# Patient Record
Sex: Female | Born: 1935 | Race: Black or African American | Hispanic: No | Smoking: Never smoker
Health system: Southern US, Community
[De-identification: ages and names within clinical notes are randomized; demographics above are authoritative.]

## PROBLEM LIST (undated history)

## (undated) DIAGNOSIS — I509 Heart failure, unspecified: Secondary | ICD-10-CM

## (undated) DIAGNOSIS — E669 Obesity, unspecified: Secondary | ICD-10-CM

## (undated) DIAGNOSIS — E7800 Pure hypercholesterolemia, unspecified: Secondary | ICD-10-CM

## (undated) DIAGNOSIS — I10 Essential (primary) hypertension: Secondary | ICD-10-CM

## (undated) DIAGNOSIS — E119 Type 2 diabetes mellitus without complications: Secondary | ICD-10-CM

## (undated) HISTORY — PX: CATARACT EXTRACTION, BILATERAL: SHX1313

## (undated) HISTORY — PX: OTHER SURGICAL HISTORY: SHX169

## (undated) HISTORY — PX: PARTIAL HYSTERECTOMY: SHX80

## (undated) HISTORY — DX: Essential (primary) hypertension: I10

## (undated) HISTORY — DX: Pure hypercholesterolemia, unspecified: E78.00

## (undated) NOTE — *Deleted (*Deleted)
[facility]  Name: Helen Richardson  MRN: [id number] DOB: 07/07/1933  Chief Complaint/ HPI  Helen Richardson,  9 y.o. , female presents for their Initial CCM visit with the clinical pharmacist via telephone due to COVID-19 Pandemic. Call completed with patient's nurse, [name].   PCP : Dorothyann Peng, MD  Their chronic conditions include: Hypertension, Hyperlipidemia and Diabetes  Office Visits: 12/14/18 OV: DM/HTN follow up. Pt complains of memory issues, feels she is more forgetful. Pt encouraged to keep track of daily blood sugars. HgbA1c has decreased to 9.7% (goal <8%). HTN fair control. Check RPR and TSH to determine if contributing to memory issues. Thyroid function normal. Liver and kidney function stable. Cholesterol is great. Encouraged chair exercises 5 times weekly. HD flu vaccine.   Consult Visits: 11/04/19 Palliative Care  07/12/19 Podiatry OV w/ Dr. Stacie Acres: Presented for at risk foot care. Mechanical debridement of nails 1-5 bilaterally. Return in 3 months.   07/06/19 Palliative Care: Goals of care addressed. Educations provided regarding diet (avoiding sweets an high calorie foods)  05/09/19 Palliative Care: Goals of care include to maximize quality of life and symptom management  CCM Encounters:  Medications: Outpatient Encounter Medications as of 12/30/2019  Medication Sig Note  . Alcohol Swabs (ALCOHOL PREP) 70 % PADS  03/10/2013: Received from: External Pharmacy  . aspirin 81 MG chewable tablet Chew 81 mg by mouth at bedtime.   . Calcium Carbonate-Vitamin D (CALTRATE 600+D PO) Take 1 tablet by mouth daily.   . carvedilol (COREG) 6.25 MG tablet TAKE 1 TABLET BY MOUTH TWICE DAILY   . diclofenac sodium (VOLTAREN) 1 % GEL APPLY TO AFFECTED AREA 3 TIMES DAILY AS NEEDED (Patient taking differently: Apply 2 g topically 3 (three) times daily as needed (pain). )   . furosemide (LASIX) 40 MG tablet TAKE 1 TABLET(40 MG) BY MOUTH DAILY   . glucose blood test strip  Test up to 4 times daily   . Insulin Pen Needle 32G X 4 MM MISC Use with insulin Dx code e11.65   . polyethylene glycol powder (GLYCOLAX/MIRALAX) 17 GM/SCOOP powder polyethylene glycol 3350 17 gram/dose oral powder  MIX 1 CAPFUL IN DRINK AND TK PO 1 TO 3 TIMES DAILY PRF DAILY SOFT STOOLS   . potassium chloride SA (KLOR-CON) 20 MEQ tablet Take 1 tablet (20 mEq total) by mouth once for 1 dose. Please d/c 2 rx for 2 times per day thanks   . rosuvastatin (CRESTOR) 10 MG tablet TAKE 1 TABLET BY MOUTH MONDAY-FRIDAY. DO NOT TAKE ON SATURDAY OR SUNDAY   . rosuvastatin (CRESTOR) 20 MG tablet TAKE 1 TABLET BY MOUTH EVERY DAY   . telmisartan (MICARDIS) 80 MG tablet TAKE 1 TABLET(80 MG) BY MOUTH DAILY   . TRADJENTA 5 MG TABS tablet TK 1 T PO QD   . TRESIBA FLEXTOUCH 200 UNIT/ML FlexTouch Pen INJECT 36 UNITS UNDER THE SKIN DAILY. MAX TITRATION 80 UNITS   . UNABLE TO FIND lancets 30 gauge   . UNABLE TO FIND Comfort EZ Pen Needles 32 gauge x 5/32"    No facility-administered encounter medications on file as of 12/30/2019.    Current Diagnosis/Assessment:    Goals Addressed   None     Diabetes   A1c goal <8%  Recent Relevant Labs: Lab Results  Component Value Date/Time   HGBA1C 9.7 (H) 12/21/2018 03:50 PM   HGBA1C 10.2 (H) 07/12/2018 02:44 PM    Last diabetic Eye exam:  Lab Results  Component Value  Date/Time   HMDIABEYEEXA Retinopathy (A) 09/28/2018 12:00 AM    Last diabetic Foot exam: Followed by Podiatry regularly  Checking BG: Daily  Recent FBG Readings: Average 130-134, 118 this morning Recent pre-meal BG readings:  Recent 2hr PP BG readings:   Recent HS BG readings:   Patient has failed these meds in past: Tradjenta, Humalog Mix Kwikpen Patient is currently uncontrolled on the following medications: . Tresiba 200 units/ml 36 units daily  We discussed:  . Per [name], pt administers Tresiba 36 units in the morning . Diet extensively o Breakfast: Grits, egg, bacon, toast o  Snacks: Yogurt, crackers o Dinner: Varies, [employer], [employer], Journalist, newspaper  . Exercise extensively o Pt is able to exercise, walks around house o Physical therapy comes to the home sometimes . [name] was stopped due to side effects, but still on medication list  Plan Continue current medications   Hyperlipidemia   LDL goal < 70  Lipid Panel     Component Value Date/Time   CHOL 153 12/21/2018 1550   TRIG 97 12/21/2018 1550   HDL 65 12/21/2018 1550   LDLCALC 70 12/21/2018 1550    Hepatic Function Latest Ref Rng & Units 12/21/2018 06/11/2018 06/10/2018  Total Protein 6.0 - 8.5 g/dL 6.3 4.0(J) 6.4(L)  Albumin 3.6 - 4.6 g/dL 3.6 2.0(L) 2.0(L)  AST 0 - 40 IU/L 12 18 21   ALT 0 - 32 IU/L 9 20 22   Alk Phosphatase 39 - 117 IU/L 71 67 61  Total Bilirubin 0.0 - 1.2 mg/dL 0.2 0.5 0.7  Bilirubin, Direct 0.0 - 0.2 mg/dL - - 0.3(H)     The ASCVD Risk score Denman George DC Jr., et al., 2013) failed to calculate for the following reasons:   The 2013 ASCVD risk score is only valid for ages 43 to 54   Patient has failed these meds in past: N/A Patient is currently controlled on the following medications:  . Rosuvastatin 20mg  daily Monday through Friday   We discussed:   Diet and exercise extensively . Pt is taking cholesterol medication Monday through Friday  Plan Continue current medications  Collaborate with PCP regarding correct does of rosuvastatin 10mg  vs 20mg  and daily versus Monday through Friday  Hypertension   BP goal is:  <130/80  Office blood pressures are  BP Readings from Last 3 Encounters:  12/14/18 140/72  07/12/18 120/84  07/02/18 (!) 130/58   Patient checks BP at home daily Patient home BP readings are ranging: 130-140/70  Patient has failed these meds in the past: Losartan Patient is currently controlled on the following medications:  . Telmisartan 80mg  daily . Furosemide 40mg  daily . Carvedilol 6.25mg  twice daily . Potassium daily  We discussed: . BP  is fairly well controlled at home . Per [name], pt was changed to potassium 1 tablet daily instead of twice daily o Inquired when next office visit was and when labwork needs to be done o Collaborated with PCP staff to contact pt to schedule office visit and labwork to determine dosing for potassium . Relayed to [place] that appt scheduled for 9/14  Plan Continue current medications     Vaccines   Reviewed and discussed patient's vaccination history.    Immunization History  Administered Date(s) Administered  . Influenza, High Dose Seasonal PF 11/19/2017, 12/14/2018   Plan Review and discuss at follow up  Follow up:   Willa Frater, PharmD Clinical Pharmacist [facility] [phone]

---

## 1998-11-08 ENCOUNTER — Other Ambulatory Visit: Admission: RE | Admit: 1998-11-08 | Discharge: 1998-11-08 | Payer: Self-pay | Admitting: Obstetrics

## 1999-08-28 ENCOUNTER — Encounter: Admission: RE | Admit: 1999-08-28 | Discharge: 1999-08-28 | Payer: Self-pay | Admitting: Cardiology

## 1999-08-28 ENCOUNTER — Encounter: Payer: Self-pay | Admitting: Cardiology

## 1999-11-25 ENCOUNTER — Other Ambulatory Visit: Admission: RE | Admit: 1999-11-25 | Discharge: 1999-11-25 | Payer: Self-pay | Admitting: Obstetrics

## 2001-07-09 ENCOUNTER — Emergency Department (HOSPITAL_COMMUNITY): Admission: EM | Admit: 2001-07-09 | Discharge: 2001-07-09 | Payer: Self-pay | Admitting: Emergency Medicine

## 2003-01-09 ENCOUNTER — Encounter (INDEPENDENT_AMBULATORY_CARE_PROVIDER_SITE_OTHER): Payer: Self-pay | Admitting: Specialist

## 2003-01-09 ENCOUNTER — Ambulatory Visit (HOSPITAL_COMMUNITY): Admission: RE | Admit: 2003-01-09 | Discharge: 2003-01-09 | Payer: Self-pay | Admitting: General Surgery

## 2004-02-29 ENCOUNTER — Ambulatory Visit: Payer: Self-pay | Admitting: Internal Medicine

## 2006-01-01 ENCOUNTER — Ambulatory Visit (HOSPITAL_COMMUNITY): Admission: RE | Admit: 2006-01-01 | Discharge: 2006-01-01 | Payer: Self-pay | Admitting: Ophthalmology

## 2006-01-31 ENCOUNTER — Ambulatory Visit (HOSPITAL_COMMUNITY): Admission: RE | Admit: 2006-01-31 | Discharge: 2006-01-31 | Payer: Self-pay | Admitting: Ophthalmology

## 2006-09-24 ENCOUNTER — Encounter: Admission: RE | Admit: 2006-09-24 | Discharge: 2006-09-24 | Payer: Self-pay | Admitting: Cardiology

## 2009-04-21 ENCOUNTER — Emergency Department (HOSPITAL_COMMUNITY): Admission: EM | Admit: 2009-04-21 | Discharge: 2009-04-21 | Payer: Self-pay | Admitting: Emergency Medicine

## 2010-05-22 LAB — URINALYSIS, ROUTINE W REFLEX MICROSCOPIC
Bilirubin Urine: NEGATIVE
Glucose, UA: NEGATIVE mg/dL
Hgb urine dipstick: NEGATIVE
Ketones, ur: NEGATIVE mg/dL
Nitrite: NEGATIVE
Protein, ur: NEGATIVE mg/dL
Specific Gravity, Urine: 1.004 — ABNORMAL LOW (ref 1.005–1.030)
Urobilinogen, UA: 0.2 mg/dL (ref 0.0–1.0)
pH: 6.5 (ref 5.0–8.0)

## 2010-05-22 LAB — GLUCOSE, CAPILLARY: Glucose-Capillary: 188 mg/dL — ABNORMAL HIGH (ref 70–99)

## 2010-06-20 ENCOUNTER — Other Ambulatory Visit: Payer: Self-pay | Admitting: Podiatry

## 2010-06-20 DIAGNOSIS — I739 Peripheral vascular disease, unspecified: Secondary | ICD-10-CM

## 2010-06-21 ENCOUNTER — Encounter (INDEPENDENT_AMBULATORY_CARE_PROVIDER_SITE_OTHER): Payer: Medicare Other | Admitting: Cardiology

## 2010-06-21 DIAGNOSIS — E1159 Type 2 diabetes mellitus with other circulatory complications: Secondary | ICD-10-CM

## 2010-06-24 ENCOUNTER — Encounter: Payer: Self-pay | Admitting: Podiatry

## 2010-07-19 NOTE — Op Note (Signed)
NAME:  Helen Richardson, Helen Richardson               ACCOUNT NO.:  [id number]   MEDICAL RECORD NO.:  [id number]          PATIENT TYPE:  AMB   LOCATION:  SDS                          FACILITY:  [facility]   PHYSICIAN:  Salley Scarlet., M.D.DATE OF BIRTH:  18-Aug-1933   DATE OF PROCEDURE:  DATE OF DISCHARGE:  01/01/2006                               OPERATIVE REPORT   PREOPERATIVE DIAGNOSIS:  Immature cataract, right eye.   POSTOPERATIVE DIAGNOSIS:  Immature cataract, right eye.   OPERATION:  Extracapsular cataract extraction with intraocular lens  implantation.   ANESTHESIA:  Local using Xylocaine 2% with Marcaine 0.75%, [place].   JUSTIFICATION FOR PROCEDURE:  This is a 77 year old diabetic lady who  complains of blurring of vision with difficulty seeing to read.  She was  evaluated and found to have a visual acuity best corrected at 20/70 on  the right and over 50 on the left.  She has a history of diabetic  retinopathy and has had laser treatment done by Dr. Luciana Axe.  Cataract  extraction of the right eye was recommended.  She is admitted at this  time for that purpose.   PROCEDURE:  Under the influence of IV sedation and Van Lint akinesia, a  retrobulbar anesthesia was given.  The patient was prepped and draped in  the usual manner.  The lid speculum was inserted under the upper and  lower lids of the right eye, and a 4-0 silk traction suture was passed  through the belly of the superior rectus muscle for retraction.  A  fornix-based conjunctival flap was turned, and hemostasis was achieved  using cautery.  The eye was inspected, and the pupil was found to be  extremely miotic.  Therefore, the decision was made to do an  extracapsular procedure rather than a phaco procedure.  Therefore, an  incision was made in the sclera at the limbus to extend for  approximately 120 degrees.  The anterior chamber was entered through  this groove incision at the 11:30 o'clock position.  Ocucoat was  injected  into the eye.  An anterior capsulotomy was done using a bent 25-  gauge needle.  The corneoscleral wound was then extended first towards  the left and then towards the right, placing a single 8-0 Vicryl suture  across each arm of the incision as it was made towards the left and  towards the right.  The nucleus was then manually expressed from the  eye.  The two previously placed sutures were closed, and third one was  placed at the 12 o'clock position.  The IA handpiece was passed into the  eye, and the residual cortical material was aspirated.  The posterior  capsule was polished using an olive tip polisher.  Ocucoat was injected  into the eye again, and a posterior chamber lens of the EZE60 type was  inserted into the eye behind the iris without difficulty.  The anterior  chamber was reformed, and the pupil was restricted using Miochol.  The  residual Ocucoat was aspirated from the eye.  The corneoscleral wound  was then closed by using a combination  of interrupted sutures of 8-0  Vicryl and 10-0 nylon.  As it was ascertained that the wound was  airtight and watertight, the conjunctiva was closed using thermal  cautery.  1 mL of Celestone and 0.5 cc of gentamicin were injected  subconjunctivally.  Maxitrol ophthalmic ointment and paraffin ointment  were applied along with a patch and Fox shield.   The patient tolerated the procedure well and was discharged to the  postanesthesia recovery room in satisfactory condition.  She is  instructed to rest today, to take Darvocet N 100 every 4 hours as needed  for pain, and to see me in the office tomorrow for further evaluation.   DISCHARGE DIAGNOSIS:  Immature cataract, right eye.      Salley Scarlet., M.D.  Electronically Signed     TB/MEDQ  D:  01/02/2006  T:  01/02/2006  Job:  [id number]

## 2010-09-13 ENCOUNTER — Encounter (HOSPITAL_COMMUNITY)
Admission: RE | Admit: 2010-09-13 | Discharge: 2010-09-13 | Disposition: A | Discharge status: Home / Self Care | Payer: Medicare Other | Source: Ambulatory Visit | Attending: Ophthalmology | Admitting: Ophthalmology

## 2010-09-13 ENCOUNTER — Ambulatory Visit (HOSPITAL_COMMUNITY)
Admission: RE | Admit: 2010-09-13 | Discharge: 2010-09-13 | Disposition: A | Discharge status: Home / Self Care | Payer: Medicare Other | Source: Ambulatory Visit | Attending: Ophthalmology | Admitting: Ophthalmology

## 2010-09-13 ENCOUNTER — Other Ambulatory Visit (HOSPITAL_COMMUNITY): Payer: Self-pay | Admitting: Ophthalmology

## 2010-09-13 DIAGNOSIS — E119 Type 2 diabetes mellitus without complications: Secondary | ICD-10-CM | POA: Insufficient documentation

## 2010-09-13 DIAGNOSIS — R05 Cough: Secondary | ICD-10-CM | POA: Insufficient documentation

## 2010-09-13 DIAGNOSIS — Z01812 Encounter for preprocedural laboratory examination: Secondary | ICD-10-CM | POA: Insufficient documentation

## 2010-09-13 DIAGNOSIS — Z01810 Encounter for preprocedural cardiovascular examination: Secondary | ICD-10-CM | POA: Insufficient documentation

## 2010-09-13 DIAGNOSIS — I10 Essential (primary) hypertension: Secondary | ICD-10-CM | POA: Insufficient documentation

## 2010-09-13 DIAGNOSIS — H269 Unspecified cataract: Secondary | ICD-10-CM | POA: Insufficient documentation

## 2010-09-13 DIAGNOSIS — R059 Cough, unspecified: Secondary | ICD-10-CM | POA: Insufficient documentation

## 2010-09-13 DIAGNOSIS — Z01818 Encounter for other preprocedural examination: Secondary | ICD-10-CM | POA: Insufficient documentation

## 2010-09-13 LAB — BASIC METABOLIC PANEL
BUN: 16 mg/dL (ref 6–23)
CO2: 30 mEq/L (ref 19–32)
Calcium: 9.7 mg/dL (ref 8.4–10.5)
Chloride: 105 mEq/L (ref 96–112)
Creatinine, Ser: 0.78 mg/dL (ref 0.50–1.10)
GFR calc Af Amer: >60 mL/min (ref >60)
GFR calc non Af Amer: >60 mL/min (ref >60)
Glucose, Bld: 44 mg/dL — CL (ref 70–99)
Potassium: 4.2 mEq/L (ref 3.5–5.1)
Sodium: 142 mEq/L (ref 135–145)

## 2010-09-13 LAB — CBC
HCT: 38.5 % (ref 36.0–46.0)
Hemoglobin: 13.3 g/dL (ref 12.0–15.0)
MCH: 30.6 pg (ref 26.0–34.0)
MCHC: 34.5 g/dL (ref 30.0–36.0)
MCV: 88.7 fL (ref 78.0–100.0)
Platelets: 234 10*3/uL (ref 150–400)
RBC: 4.34 MIL/uL (ref 3.87–5.11)
RDW: 13.5 % (ref 11.5–15.5)
WBC: 7.2 10*3/uL (ref 4.0–10.5)

## 2010-09-16 ENCOUNTER — Ambulatory Visit (HOSPITAL_COMMUNITY)
Admission: RE | Admit: 2010-09-16 | Discharge: 2010-09-16 | Disposition: A | Discharge status: Home / Self Care | Payer: Medicare Other | Source: Ambulatory Visit | Attending: Ophthalmology | Admitting: Ophthalmology

## 2010-09-16 DIAGNOSIS — E119 Type 2 diabetes mellitus without complications: Secondary | ICD-10-CM | POA: Insufficient documentation

## 2010-09-16 DIAGNOSIS — H2510 Age-related nuclear cataract, unspecified eye: Secondary | ICD-10-CM | POA: Insufficient documentation

## 2010-09-16 DIAGNOSIS — E669 Obesity, unspecified: Secondary | ICD-10-CM | POA: Insufficient documentation

## 2010-09-16 DIAGNOSIS — I10 Essential (primary) hypertension: Secondary | ICD-10-CM | POA: Insufficient documentation

## 2010-09-16 LAB — GLUCOSE, CAPILLARY
Glucose-Capillary: 244 mg/dL — ABNORMAL HIGH (ref 70–99)
Glucose-Capillary: 174 mg/dL — ABNORMAL HIGH (ref 70–99)
Glucose-Capillary: 159 mg/dL — ABNORMAL HIGH (ref 70–99)

## 2010-10-02 NOTE — Op Note (Signed)
  NAME:  Helen Richardson, Helen Richardson               ACCOUNT NO.:  [id number]  MEDICAL RECORD NO.:  [id number]  LOCATION:  SDSC                         FACILITY:  [facility]  PHYSICIAN:  Shade Flood, MD       DATE OF BIRTH:  09-02-1933  DATE OF PROCEDURE:  09/16/2010 DATE OF DISCHARGE:  09/16/2010                              OPERATIVE REPORT   PREOPERATIVE DIAGNOSIS:  Nuclear sclerotic cataract, left eye.  POSTOPERATIVE DIAGNOSIS:  Nuclear sclerotic cataract, left eye.  PROCEDURE PERFORMED:  Phacoemulsification with posterior chamber intraocular lens, left eye.  SURGEON:  Shade Flood, MD.  ESTIMATED BLOOD LOSS:  Less than 1 mL.  There were no complications.  No specimens for pathology.  DESCRIPTION OF PROCEDURE:  The patient was prepared and draped in the usual fashion for ocular surgery on the left eye and a solid lid speculum was placed.  A peripheral clear corneal incision was made centered at the 11 o'clock meridian with a Grieshaber 01 blade.  A separate stab incision with a 15-degree blade was made at the 2:13 meridian.  Amvisc was then instilled into the anterior chamber.  A bent 25-gauge needle was used to perform a capsulorrhexis.  The lens was then hydrodissected using balanced salt solution and Nichamin cannula.  The Chang chopper was then used to rotate the lens within the capsule bag and then the wound was enlarged to permit the phaco handpiece.  A combined phaco chop technique was employed fracturing the lens into sections with subsequent removal from the eye.  The IA cannula was then used to remove cortex from the capsule bag.  The wound was enlarged to its full extent and Provisc was again instilled into the anterior chamber.  The Monarch injector was used to place a folded AcrySof MA50BM lens into the capsular bag.  The angled Sinskey lens hook was then used to dial the trailing haptic into position.  The IA cannula was used to remove the viscoelastic from the anterior  chamber.  The wound was self- sealing, so no additional sutures were employed.  The patient was given 4 mg of Decadron subconjunctivally with the needle directly visualized and 100 mg of Ancef subconjunctivally with the needle directly visualized.  The lid speculum was removed and the patient's eye was patched using polymyxin bacitracin ointment, plastic shield was placed, and she was transferred from the operating room to the postoperative recovery area with stable vital signs.          ______________________________ Shade Flood, MD     GG/MEDQ  D:  09/18/2010  T:  09/18/2010  Job:  [id number]  Electronically Signed by Shade Flood MD on 10/02/2010 11:39:28 AM

## 2013-02-10 ENCOUNTER — Ambulatory Visit: Payer: Self-pay | Admitting: Podiatry

## 2013-03-10 ENCOUNTER — Ambulatory Visit (INDEPENDENT_AMBULATORY_CARE_PROVIDER_SITE_OTHER): Payer: Medicare PPO | Admitting: Podiatry

## 2013-03-10 ENCOUNTER — Encounter: Payer: Self-pay | Admitting: Podiatry

## 2013-03-10 VITALS — BP 107/68 | HR 77 | Resp 12

## 2013-03-10 DIAGNOSIS — B351 Tinea unguium: Secondary | ICD-10-CM

## 2013-03-10 DIAGNOSIS — M79609 Pain in unspecified limb: Secondary | ICD-10-CM

## 2013-03-10 NOTE — Progress Notes (Signed)
Subjective:     Patient ID: Helen Richardson, female   DOB: 04/01/1933, 80 y.o.   MRN: [id number]  HPI patient is found to have nail disease with thickness and pain 1-5 both feet that she cannot take care of herself   Review of Systems     Objective:   Physical Exam Neurovascular status unchanged with thick nail disease and pain 1-5 both feet    Assessment:     Mycotic nail infection with pain 1-5 both feet    Plan:     Debridement of painful nailbeds 1-5 both feet with no iatrogenic bleeding noted

## 2013-06-02 ENCOUNTER — Ambulatory Visit: Payer: Medicare PPO | Admitting: Podiatry

## 2013-06-09 ENCOUNTER — Encounter: Payer: Self-pay | Admitting: Podiatry

## 2013-06-09 ENCOUNTER — Ambulatory Visit (INDEPENDENT_AMBULATORY_CARE_PROVIDER_SITE_OTHER): Payer: Medicare PPO | Admitting: Podiatry

## 2013-06-09 VITALS — BP 152/69 | HR 86 | Resp 12

## 2013-06-09 DIAGNOSIS — M79609 Pain in unspecified limb: Secondary | ICD-10-CM

## 2013-06-09 DIAGNOSIS — B351 Tinea unguium: Secondary | ICD-10-CM

## 2013-06-12 NOTE — Progress Notes (Signed)
Subjective:     Patient ID: Helen Richardson, female   DOB: 10/17/1933, 80 y.o.   MRN: [id number]  HPI patient is presenting with thick nailbeds 1-5 both feet that are painful when pressed and she can not cut   Review of Systems     Objective:   Physical Exam Neurovascular status intact with thick painful nail bed 1-5 both feet that she cannot trim    Assessment:     Mycotic nail infection with pain 1-5 both feet    Plan:     Debridement painful nailbeds 1-5 both feet with no iatrogenic bleeding noted

## 2013-12-08 ENCOUNTER — Ambulatory Visit (INDEPENDENT_AMBULATORY_CARE_PROVIDER_SITE_OTHER): Payer: Medicare PPO | Admitting: Podiatry

## 2013-12-08 ENCOUNTER — Encounter: Payer: Self-pay | Admitting: Podiatry

## 2013-12-08 DIAGNOSIS — M79673 Pain in unspecified foot: Secondary | ICD-10-CM

## 2013-12-08 DIAGNOSIS — B351 Tinea unguium: Secondary | ICD-10-CM

## 2013-12-08 NOTE — Progress Notes (Signed)
Subjective:     Patient ID: Helen Richardson, female   DOB: 01/15/1934, 80 y.o.   MRN: [id number]  HPI patient presents with nail disease 1-5 both feet that she cannot cut   Review of Systems     Objective:   Physical Exam Neurovascular status intact with thick yellow brittle nailbeds 1-5 both feet    Assessment:     Mycotic nail infection with pain 1-5 both feet    Plan:     Debride painful nailbeds 1-5 both feet with no iatrogenic bleeding noted

## 2014-03-16 ENCOUNTER — Other Ambulatory Visit: Payer: Medicare PPO

## 2014-03-20 ENCOUNTER — Ambulatory Visit (INDEPENDENT_AMBULATORY_CARE_PROVIDER_SITE_OTHER): Payer: Medicare PPO | Admitting: Podiatry

## 2014-03-20 DIAGNOSIS — B351 Tinea unguium: Secondary | ICD-10-CM

## 2014-03-20 DIAGNOSIS — M79673 Pain in unspecified foot: Secondary | ICD-10-CM

## 2014-03-20 NOTE — Progress Notes (Signed)
Patient ID: Helen Richardson, female   DOB: 06/02/1933, 81 y.o.   MRN: [id number]  Subjective: 81 year old female presents the office today for complaints of bilateral painful, elongated toenails at digits 1 through 5. She states of the nails are painful, particularly with shoe gear. She denies any recent redness or drainage from the nail sites. No other complaints at this time.  Objective: AAO 3, NAD DP/PT pulses palpable, CRT less than 3 seconds Protective sensation appears to be intact with Dorann OuSimms Weinstein monofilament Nails hypertrophic, dystrophic, elongated, brittle, discolored 10. No swelling erythema or drainage from the nail sites. No open lesions or pre-ulcerative lesions identified. No pain with calf compression, swelling, warmth, erythema. No areas of tenderness bilateral lower extremities. No overlying edema, erythema, increase in warmth bilaterally.  Assessment: 81 year old female with symptomatic onychomycosis bilateral feet of digits 1-5  Plan: -Treatment options were discussed with the patient clean alternatives, risks, complications. -Nail sharply debrided 10 without complication/bleeding. -Discussed daily foot inspection. -Follow-Up in 3 months or sooner should any problems arise. She would like to follow-up in 6 months.  In the meantime, encouraged to call the office with any questions, concerns, change in symptoms.

## 2014-06-20 DIAGNOSIS — E11359 Type 2 diabetes mellitus with proliferative diabetic retinopathy without macular edema: Secondary | ICD-10-CM | POA: Diagnosis not present

## 2014-06-20 DIAGNOSIS — H43811 Vitreous degeneration, right eye: Secondary | ICD-10-CM | POA: Diagnosis not present

## 2014-08-15 DIAGNOSIS — N63 Unspecified lump in breast: Secondary | ICD-10-CM | POA: Diagnosis not present

## 2014-09-13 DIAGNOSIS — N182 Chronic kidney disease, stage 2 (mild): Secondary | ICD-10-CM | POA: Diagnosis not present

## 2014-09-13 DIAGNOSIS — E1122 Type 2 diabetes mellitus with diabetic chronic kidney disease: Secondary | ICD-10-CM | POA: Diagnosis not present

## 2014-09-13 DIAGNOSIS — I129 Hypertensive chronic kidney disease with stage 1 through stage 4 chronic kidney disease, or unspecified chronic kidney disease: Secondary | ICD-10-CM | POA: Diagnosis not present

## 2014-09-13 DIAGNOSIS — N08 Glomerular disorders in diseases classified elsewhere: Secondary | ICD-10-CM | POA: Diagnosis not present

## 2014-09-18 ENCOUNTER — Ambulatory Visit: Payer: Medicare PPO

## 2014-09-19 ENCOUNTER — Ambulatory Visit (INDEPENDENT_AMBULATORY_CARE_PROVIDER_SITE_OTHER): Payer: Medicare PPO | Admitting: Podiatry

## 2014-09-19 ENCOUNTER — Encounter: Payer: Self-pay | Admitting: Podiatry

## 2014-09-19 DIAGNOSIS — M79673 Pain in unspecified foot: Secondary | ICD-10-CM

## 2014-09-19 DIAGNOSIS — B351 Tinea unguium: Secondary | ICD-10-CM

## 2014-09-19 NOTE — Progress Notes (Signed)
Patient ID: Helen Richardson, female   DOB: 10/04/1933, 81 y.o.   MRN: [id number] Complaint:  Visit Type: Patient returns to my office for continued preventative foot care services. Complaint: Patient states" my nails have grown long and thick and become painful to walk and wear shoes" Patient has been diagnosed with DM with no foot complications. She presents for preventative foot care services. No changes to ROS  Podiatric Exam: Vascular: dorsalis pedis and posterior tibial pulses are palpable bilateral. Capillary return is immediate. Temperature gradient is WNL. Skin turgor WNL  Sensorium: Normal Semmes Weinstein monofilament test. Normal tactile sensation bilaterally. Nail Exam: Pt has thick disfigured discolored nails with subungual debris noted bilateral entire nail hallux through fifth toenails Ulcer Exam: There is no evidence of ulcer or pre-ulcerative changes or infection. Orthopedic Exam: Muscle tone and strength are WNL. No limitations in general ROM. No crepitus or effusions noted. Foot type and digits show no abnormalities. Bony prominences are unremarkable. Skin: No Porokeratosis. No infection or ulcers  Diagnosis:  Tinea unguium, Pain in right toe, pain in left toes  Treatment & Plan Procedures and Treatment: Consent by patient was obtained for treatment procedures. The patient understood the discussion of treatment and procedures well. All questions were answered thoroughly reviewed. Debridement of mycotic and hypertrophic toenails, 1 through 5 bilateral and clearing of subungual debris. No ulceration, no infection noted.  Return Visit-Office Procedure: Patient instructed to return to the office for a follow up visit 3 months for continued evaluation and treatment.

## 2014-10-04 DIAGNOSIS — E1165 Type 2 diabetes mellitus with hyperglycemia: Secondary | ICD-10-CM | POA: Diagnosis not present

## 2014-12-14 DIAGNOSIS — I129 Hypertensive chronic kidney disease with stage 1 through stage 4 chronic kidney disease, or unspecified chronic kidney disease: Secondary | ICD-10-CM | POA: Diagnosis not present

## 2014-12-14 DIAGNOSIS — N08 Glomerular disorders in diseases classified elsewhere: Secondary | ICD-10-CM | POA: Diagnosis not present

## 2014-12-14 DIAGNOSIS — E1122 Type 2 diabetes mellitus with diabetic chronic kidney disease: Secondary | ICD-10-CM | POA: Diagnosis not present

## 2014-12-14 DIAGNOSIS — Z23 Encounter for immunization: Secondary | ICD-10-CM | POA: Diagnosis not present

## 2014-12-14 DIAGNOSIS — N182 Chronic kidney disease, stage 2 (mild): Secondary | ICD-10-CM | POA: Diagnosis not present

## 2014-12-26 ENCOUNTER — Encounter: Payer: Self-pay | Admitting: Podiatry

## 2014-12-26 ENCOUNTER — Ambulatory Visit (INDEPENDENT_AMBULATORY_CARE_PROVIDER_SITE_OTHER): Payer: Medicare PPO | Admitting: Podiatry

## 2014-12-26 DIAGNOSIS — M79673 Pain in unspecified foot: Secondary | ICD-10-CM | POA: Diagnosis not present

## 2014-12-26 DIAGNOSIS — B351 Tinea unguium: Secondary | ICD-10-CM | POA: Diagnosis not present

## 2014-12-26 NOTE — Progress Notes (Signed)
Patient ID: Helen Richardson, female   DOB: 03/30/1933, 81 y.o.   MRN: [id number] Complaint:  Visit Type: Patient returns to my office for continued preventative foot care services. Complaint: Patient states" my nails have grown long and thick and become painful to walk and wear shoes" Patient has been diagnosed with DM with no foot complications. She presents for preventative foot care services. No changes to ROS  Podiatric Exam: Vascular: dorsalis pedis and posterior tibial pulses are palpable bilateral. Capillary return is immediate. Temperature gradient is WNL. Skin turgor WNL  Sensorium: Normal Semmes Weinstein monofilament test. Normal tactile sensation bilaterally. Nail Exam: Pt has thick disfigured discolored nails with subungual debris noted bilateral entire nail hallux through fifth toenails Ulcer Exam: There is no evidence of ulcer or pre-ulcerative changes or infection. Orthopedic Exam: Muscle tone and strength are WNL. No limitations in general ROM. No crepitus or effusions noted. Foot type and digits show no abnormalities. Bony prominences are unremarkable. Skin: No Porokeratosis. No infection or ulcers  Diagnosis:  Tinea unguium, Pain in right toe, pain in left toes  Treatment & Plan Procedures and Treatment: Consent by patient was obtained for treatment procedures. The patient understood the discussion of treatment and procedures well. All questions were answered thoroughly reviewed. Debridement of mycotic and hypertrophic toenails, 1 through 5 bilateral and clearing of subungual debris. No ulceration, no infection noted.  Return Visit-Office Procedure: Patient instructed to return to the office for a follow up visit 3 months for continued evaluation and treatment. 

## 2014-12-28 DIAGNOSIS — E119 Type 2 diabetes mellitus without complications: Secondary | ICD-10-CM | POA: Diagnosis not present

## 2014-12-28 DIAGNOSIS — E876 Hypokalemia: Secondary | ICD-10-CM | POA: Diagnosis not present

## 2014-12-28 DIAGNOSIS — E785 Hyperlipidemia, unspecified: Secondary | ICD-10-CM | POA: Diagnosis not present

## 2014-12-28 DIAGNOSIS — I10 Essential (primary) hypertension: Secondary | ICD-10-CM | POA: Diagnosis not present

## 2014-12-28 DIAGNOSIS — E669 Obesity, unspecified: Secondary | ICD-10-CM | POA: Diagnosis not present

## 2014-12-28 DIAGNOSIS — E559 Vitamin D deficiency, unspecified: Secondary | ICD-10-CM | POA: Diagnosis not present

## 2014-12-28 DIAGNOSIS — Z6839 Body mass index (BMI) 39.0-39.9, adult: Secondary | ICD-10-CM | POA: Diagnosis not present

## 2014-12-28 DIAGNOSIS — R413 Other amnesia: Secondary | ICD-10-CM | POA: Diagnosis not present

## 2014-12-28 DIAGNOSIS — Z794 Long term (current) use of insulin: Secondary | ICD-10-CM | POA: Diagnosis not present

## 2015-01-16 DIAGNOSIS — E1165 Type 2 diabetes mellitus with hyperglycemia: Secondary | ICD-10-CM | POA: Diagnosis not present

## 2015-02-16 DIAGNOSIS — M1712 Unilateral primary osteoarthritis, left knee: Secondary | ICD-10-CM | POA: Diagnosis not present

## 2015-02-16 DIAGNOSIS — M1711 Unilateral primary osteoarthritis, right knee: Secondary | ICD-10-CM | POA: Diagnosis not present

## 2015-04-10 ENCOUNTER — Ambulatory Visit: Payer: Medicare PPO | Admitting: Podiatry

## 2015-04-13 ENCOUNTER — Ambulatory Visit (INDEPENDENT_AMBULATORY_CARE_PROVIDER_SITE_OTHER): Payer: Medicare Other | Admitting: Podiatry

## 2015-04-13 DIAGNOSIS — M79673 Pain in unspecified foot: Secondary | ICD-10-CM | POA: Diagnosis not present

## 2015-04-13 DIAGNOSIS — B351 Tinea unguium: Secondary | ICD-10-CM | POA: Diagnosis not present

## 2015-04-13 NOTE — Progress Notes (Signed)
Patient ID: Helen Richardson, female   DOB: 05/28/1933, 82 y.o.   MRN: [id number] Complaint:  Visit Type: Patient returns to my office for continued preventative foot care services. Complaint: Patient states" my nails have grown long and thick and become painful to walk and wear shoes" Patient has been diagnosed with DM with no foot complications. She presents for preventative foot care services. No changes to ROS  Podiatric Exam: Vascular: dorsalis pedis and posterior tibial pulses are palpable bilateral. Capillary return is immediate. Temperature gradient is WNL. Skin turgor WNL  Sensorium: Normal Semmes Weinstein monofilament test. Normal tactile sensation bilaterally. Nail Exam: Pt has thick disfigured discolored nails with subungual debris noted bilateral entire nail hallux through fifth toenails Ulcer Exam: There is no evidence of ulcer or pre-ulcerative changes or infection. Orthopedic Exam: Muscle tone and strength are WNL. No limitations in general ROM. No crepitus or effusions noted. Foot type and digits show no abnormalities. Bony prominences are unremarkable. Skin: No Porokeratosis. No infection or ulcers  Diagnosis:  Tinea unguium, Pain in right toe, pain in left toes  Treatment & Plan Procedures and Treatment: Consent by patient was obtained for treatment procedures. The patient understood the discussion of treatment and procedures well. All questions were answered thoroughly reviewed. Debridement of mycotic and hypertrophic toenails, 1 through 5 bilateral and clearing of subungual debris. No ulceration, no infection noted.  Return Visit-Office Procedure: Patient instructed to return to the office for a follow up visit 3 months for continued evaluation and treatment.   [name] DPM

## 2015-06-28 ENCOUNTER — Encounter (HOSPITAL_COMMUNITY): Payer: Self-pay | Admitting: *Deleted

## 2015-06-28 DIAGNOSIS — E669 Obesity, unspecified: Secondary | ICD-10-CM | POA: Diagnosis not present

## 2015-06-28 DIAGNOSIS — E11649 Type 2 diabetes mellitus with hypoglycemia without coma: Secondary | ICD-10-CM | POA: Diagnosis not present

## 2015-06-28 LAB — CBC
HCT: 36.9 % (ref 36.0–46.0)
Hemoglobin: 11.9 g/dL — ABNORMAL LOW (ref 12.0–15.0)
MCH: 28.1 pg (ref 26.0–34.0)
MCHC: 32.2 g/dL (ref 30.0–36.0)
MCV: 87.2 fL (ref 78.0–100.0)
Platelets: 274 10*3/uL (ref 150–400)
RBC: 4.23 MIL/uL (ref 3.87–5.11)
RDW: 15.1 % (ref 11.5–15.5)
WBC: 6.9 10*3/uL (ref 4.0–10.5)

## 2015-06-28 LAB — BASIC METABOLIC PANEL
Anion gap: 9 (ref 5–15)
BUN: 17 mg/dL (ref 6–20)
CO2: 27 mmol/L (ref 22–32)
Calcium: 9.7 mg/dL (ref 8.9–10.3)
Chloride: 102 mmol/L (ref 101–111)
Creatinine, Ser: 1.11 mg/dL — ABNORMAL HIGH (ref 0.44–1.00)
GFR calc Af Amer: 53 mL/min — ABNORMAL LOW (ref >60)
GFR calc non Af Amer: 46 mL/min — ABNORMAL LOW (ref >60)
Glucose, Bld: 33 mg/dL — CL (ref 65–99)
Potassium: 4.3 mmol/L (ref 3.5–5.1)
Sodium: 138 mmol/L (ref 135–145)

## 2015-06-28 LAB — CBG MONITORING, ED
Glucose-Capillary: 196 mg/dL — ABNORMAL HIGH (ref 65–99)
Glucose-Capillary: 33 mg/dL — CL (ref 65–99)
Glucose-Capillary: 105 mg/dL — ABNORMAL HIGH (ref 65–99)

## 2015-06-28 MED ORDER — DEXTROSE 50 % IV SOLN
1.0000 | Freq: Once | INTRAVENOUS | Status: AC
  Administered 2015-06-28: 50 mL via INTRAVENOUS

## 2015-06-28 MED ORDER — DEXTROSE 50 % IV SOLN
INTRAVENOUS | Status: AC
  Filled 2015-06-28: qty 50

## 2015-06-28 NOTE — ED Notes (Signed)
Pt was upstairs as a visitor when her family member felt that her sugar dropped.  They did not check it, they brought her into the ED.  Pt did eat around 1830.  Pt is moving all extremities, no face droop but pt appears tired and family member said she was diaphoretic.

## 2015-06-28 NOTE — ED Notes (Signed)
Pt is feeling well.  She states that she is unsure what type of insulin she takes but its "30units in the am and 20units in the pm"

## 2015-06-28 NOTE — ED Notes (Signed)
Pt came in altered.  CBG 33, IV started and D50% given and pt becomes alert and speaking with us.  No distress.

## 2015-06-29 ENCOUNTER — Emergency Department (HOSPITAL_COMMUNITY)
Admission: EM | Admit: 2015-06-29 | Discharge: 2015-06-29 | Disposition: A | Discharge status: Home / Self Care | Payer: Medicare Other | Attending: Emergency Medicine | Admitting: Emergency Medicine

## 2015-06-29 HISTORY — DX: Obesity, unspecified: E66.9

## 2015-06-29 HISTORY — DX: Type 2 diabetes mellitus without complications: E11.9

## 2015-06-29 LAB — CBG MONITORING, ED
Glucose-Capillary: 163 mg/dL — ABNORMAL HIGH (ref 65–99)
Glucose-Capillary: 87 mg/dL (ref 65–99)

## 2015-07-27 ENCOUNTER — Ambulatory Visit (INDEPENDENT_AMBULATORY_CARE_PROVIDER_SITE_OTHER): Payer: Medicare Other | Admitting: Podiatry

## 2015-07-27 ENCOUNTER — Encounter: Payer: Self-pay | Admitting: Podiatry

## 2015-07-27 DIAGNOSIS — Z794 Long term (current) use of insulin: Secondary | ICD-10-CM

## 2015-07-27 DIAGNOSIS — M2010 Hallux valgus (acquired), unspecified foot: Secondary | ICD-10-CM

## 2015-07-27 DIAGNOSIS — M79673 Pain in unspecified foot: Secondary | ICD-10-CM | POA: Diagnosis not present

## 2015-07-27 DIAGNOSIS — E1140 Type 2 diabetes mellitus with diabetic neuropathy, unspecified: Secondary | ICD-10-CM | POA: Diagnosis not present

## 2015-07-27 DIAGNOSIS — B351 Tinea unguium: Secondary | ICD-10-CM

## 2015-07-27 NOTE — Progress Notes (Signed)
Patient ID: Helen Richardson, female   DOB: 03/23/1933, 82 y.o.   MRN: [id number] Complaint:  Visit Type: Patient returns to my office for continued preventative foot care services. Complaint: Patient states" my nails have grown long and thick and become painful to walk and wear shoes" Patient has been diagnosed with DM with no foot complications. She presents for preventative foot care services. No changes to ROS  Podiatric Exam: Vascular: dorsalis pedis and posterior tibial pulses are palpable bilateral. Capillary return is immediate. Temperature gradient is WNL. Skin turgor WNL  Sensorium: Normal Semmes Weinstein monofilament test. Normal tactile sensation bilaterally. Nail Exam: Pt has thick disfigured discolored nails with subungual debris noted bilateral entire nail hallux through fifth toenails Ulcer Exam: There is no evidence of ulcer or pre-ulcerative changes or infection. Orthopedic Exam: Muscle tone and strength are WNL. No limitations in general ROM. No crepitus or effusions noted. Asymptomatic HAV  B/L with hammer toe second left foot. Skin: No Porokeratosis. No infection or ulcers  Diagnosis:  Tinea unguium, Pain in right toe, pain in left toes  Treatment & Plan Procedures and Treatment: Consent by patient was obtained for treatment procedures. The patient understood the discussion of treatment and procedures well. All questions were answered thoroughly reviewed. Debridement of mycotic and hypertrophic toenails, 1 through 5 bilateral and clearing of subungual debris. No ulceration, no infection noted. Initiate diabetic shoe paperwork. Return Visit-Office Procedure: Patient instructed to return to the office for a follow up visit 3 months for continued evaluation and treatment.   Helen GuntherGregory Akshath Richardson DPM

## 2015-10-03 ENCOUNTER — Ambulatory Visit: Payer: Medicare Other | Admitting: *Deleted

## 2015-10-03 DIAGNOSIS — Z794 Long term (current) use of insulin: Secondary | ICD-10-CM

## 2015-10-03 DIAGNOSIS — E1140 Type 2 diabetes mellitus with diabetic neuropathy, unspecified: Secondary | ICD-10-CM

## 2015-10-03 NOTE — Progress Notes (Signed)
Patient presents to be scanned and measured for diabetic shoes and inserts.  

## 2015-11-20 ENCOUNTER — Encounter: Payer: Self-pay | Admitting: Podiatry

## 2015-11-20 ENCOUNTER — Ambulatory Visit (INDEPENDENT_AMBULATORY_CARE_PROVIDER_SITE_OTHER): Payer: Medicare Other | Admitting: Podiatry

## 2015-11-20 DIAGNOSIS — M2010 Hallux valgus (acquired), unspecified foot: Secondary | ICD-10-CM | POA: Diagnosis not present

## 2015-11-20 DIAGNOSIS — M2040 Other hammer toe(s) (acquired), unspecified foot: Secondary | ICD-10-CM | POA: Diagnosis not present

## 2015-11-20 DIAGNOSIS — Z794 Long term (current) use of insulin: Secondary | ICD-10-CM

## 2015-11-20 DIAGNOSIS — E1140 Type 2 diabetes mellitus with diabetic neuropathy, unspecified: Secondary | ICD-10-CM

## 2015-11-20 NOTE — Progress Notes (Signed)
Patient presents to the office to pick up her newly received diabetic shoes.     GENERAL APPEARANCE: Alert, conversant. Appropriately groomed. No acute distress.  VASCULAR: Pedal pulses are  palpable at  [facility] bilateral.  Capillary refill time is immediate to all digits,  Normal temperature gradient.  Digital hair growth is present bilateral  NEUROLOGIC: sensation is diminished to 5.07 monofilament at 5/5 sites bilateral.  Light touch is intact bilateral, Muscle strength normal.  MUSCULOSKELETAL: acceptable muscle strength, tone and stability bilateral.  Intrinsic muscluature intact bilateral.  Rectus appearance of foot and digits noted bilateral.   DERMATOLOGIC: skin color, texture, and turgor are within normal limits.  No preulcerative lesions or ulcers  are seen, no interdigital maceration noted.  No open lesions present.  Digital nails are asymptomatic. No drainage noted.  Diagnosis  HAV   Diabetes with neuropathy   Dispense shoes.  Patient presents today and was dispensed 0ne pair ( two units) of medically necessary extra depth shoes with three pair( six units) of custom molded multiple density inserts. The shoes and the inserts are fitted to the patients ' feet and are noted to fit well and are free of defect.  Length and width of the shoes are also acceptable.  Patient was given written and verbal  instructions for wearing.  If any concerns arrive with the shoes or inserts, the patient is to call the office.Patient is to follow up with doctor in six weeks.   Helane GuntherGregory Martavis Gurney DPM

## 2016-01-29 ENCOUNTER — Encounter: Payer: Self-pay | Admitting: Podiatry

## 2016-01-29 ENCOUNTER — Ambulatory Visit (INDEPENDENT_AMBULATORY_CARE_PROVIDER_SITE_OTHER): Payer: Medicare Other | Admitting: Podiatry

## 2016-01-29 DIAGNOSIS — E1140 Type 2 diabetes mellitus with diabetic neuropathy, unspecified: Secondary | ICD-10-CM | POA: Diagnosis not present

## 2016-01-29 DIAGNOSIS — B351 Tinea unguium: Secondary | ICD-10-CM | POA: Diagnosis not present

## 2016-01-29 DIAGNOSIS — M2010 Hallux valgus (acquired), unspecified foot: Secondary | ICD-10-CM

## 2016-01-29 DIAGNOSIS — Z794 Long term (current) use of insulin: Secondary | ICD-10-CM

## 2016-01-29 NOTE — Progress Notes (Addendum)
Patient ID: Helen Richardson, female   DOB: 04/30/1933, 82 y.o.   MRN: [id number] Complaint:  Visit Type: Patient returns to my office for continued preventative foot care services. Complaint: Patient states" my nails have grown long and thick and become painful to walk and wear shoes" Patient has been diagnosed with DM with no foot complications. She presents for preventative foot care services. No changes to ROS  Podiatric Exam: Vascular: dorsalis pedis and posterior tibial pulses are palpable bilateral. Capillary return is immediate. Temperature gradient is WNL. Skin turgor WNL  Sensorium: Normal Semmes Weinstein monofilament test. Normal tactile sensation bilaterally. Nail Exam: Pt has thick disfigured discolored nails with subungual debris noted bilateral entire nail hallux through fifth toenails Ulcer Exam: There is no evidence of ulcer or pre-ulcerative changes or infection. Orthopedic Exam: Muscle tone and strength are WNL. No limitations in general ROM. No crepitus or effusions noted. Foot type and digits show no abnormalities. Bony prominences are unremarkable. Skin: No Porokeratosis. No infection or ulcers  Diagnosis:  Tinea unguium, Pain in right toe, pain in left toes  Treatment & Plan Procedures and Treatment: Consent by patient was obtained for treatment procedures. The patient understood the discussion of treatment and procedures well. All questions were answered thoroughly reviewed. Debridement of mycotic and hypertrophic toenails, 1 through 5 bilateral and clearing of subungual debris. No ulceration, no infection noted.  Return Visit-Office Procedure: Patient instructed to return to the office for a follow up visit 10 weeks for continued evaluation and treatment.   Helane GuntherGregory Morton Simson DPM

## 2016-04-08 ENCOUNTER — Ambulatory Visit: Payer: Medicare Other | Admitting: Podiatry

## 2016-04-29 ENCOUNTER — Ambulatory Visit: Payer: Medicare Other | Admitting: Sports Medicine

## 2016-05-20 ENCOUNTER — Ambulatory Visit: Payer: Medicare Other | Admitting: Sports Medicine

## 2016-05-30 ENCOUNTER — Encounter: Payer: Self-pay | Admitting: Podiatry

## 2016-05-30 ENCOUNTER — Ambulatory Visit (INDEPENDENT_AMBULATORY_CARE_PROVIDER_SITE_OTHER): Payer: Medicare Other | Admitting: Podiatry

## 2016-05-30 DIAGNOSIS — M2010 Hallux valgus (acquired), unspecified foot: Secondary | ICD-10-CM

## 2016-05-30 DIAGNOSIS — E1140 Type 2 diabetes mellitus with diabetic neuropathy, unspecified: Secondary | ICD-10-CM

## 2016-05-30 DIAGNOSIS — B351 Tinea unguium: Secondary | ICD-10-CM | POA: Diagnosis not present

## 2016-05-30 DIAGNOSIS — Z794 Long term (current) use of insulin: Secondary | ICD-10-CM

## 2016-05-30 NOTE — Progress Notes (Signed)
Patient ID: Helen Richardson, female   DOB: 01/09/1934, 83 y.o.   MRN: [id number] Complaint:  Visit Type: Patient returns to my office for continued preventative foot care services. Complaint: Patient states" my nails have grown long and thick and become painful to walk and wear shoes" Patient has been diagnosed with DM with no foot complications. She presents for preventative foot care services. No changes to ROS  Podiatric Exam: Vascular: dorsalis pedis and posterior tibial pulses are palpable bilateral. Capillary return is immediate. Temperature gradient is WNL. Skin turgor WNL  Sensorium: Normal Semmes Weinstein monofilament test. Normal tactile sensation bilaterally. Nail Exam: Pt has thick disfigured discolored nails with subungual debris noted bilateral entire nail hallux through fifth toenails Ulcer Exam: There is no evidence of ulcer or pre-ulcerative changes or infection. Orthopedic Exam: Muscle tone and strength are WNL. No limitations in general ROM. No crepitus or effusions noted. Foot type and digits show no abnormalities. Bony prominences are unremarkable. Skin: No Porokeratosis. No infection or ulcers  Diagnosis:  Tinea unguium, Pain in right toe, pain in left toes  Treatment & Plan Procedures and Treatment: Consent by patient was obtained for treatment procedures. The patient understood the discussion of treatment and procedures well. All questions were answered thoroughly reviewed. Debridement of mycotic and hypertrophic toenails, 1 through 5 bilateral and clearing of subungual debris. No ulceration, no infection noted.  Return Visit-Office Procedure: Patient instructed to return to the office for a follow up visit 10 weeks for continued evaluation and treatment.   Raedyn Wenke DPM  

## 2016-08-08 ENCOUNTER — Encounter: Payer: Self-pay | Admitting: Podiatry

## 2016-08-08 ENCOUNTER — Ambulatory Visit (INDEPENDENT_AMBULATORY_CARE_PROVIDER_SITE_OTHER): Payer: Medicare Other | Admitting: Podiatry

## 2016-08-08 DIAGNOSIS — M2010 Hallux valgus (acquired), unspecified foot: Secondary | ICD-10-CM

## 2016-08-08 DIAGNOSIS — E1140 Type 2 diabetes mellitus with diabetic neuropathy, unspecified: Secondary | ICD-10-CM | POA: Diagnosis not present

## 2016-08-08 DIAGNOSIS — M79676 Pain in unspecified toe(s): Secondary | ICD-10-CM

## 2016-08-08 DIAGNOSIS — B351 Tinea unguium: Secondary | ICD-10-CM | POA: Diagnosis not present

## 2016-08-08 DIAGNOSIS — Z794 Long term (current) use of insulin: Secondary | ICD-10-CM

## 2016-08-08 NOTE — Progress Notes (Signed)
Patient ID: Helen Richardson, female   DOB: 10/11/1933, 83 y.o.   MRN: [id number] Complaint:  Visit Type: Patient returns to my office for continued preventative foot care services. Complaint: Patient states" my nails have grown long and thick and become painful to walk and wear shoes" Patient has been diagnosed with DM with no foot complications. She presents for preventative foot care services. No changes to ROS  Podiatric Exam: Vascular: dorsalis pedis and posterior tibial pulses are palpable bilateral. Capillary return is immediate. Temperature gradient is WNL. Skin turgor WNL  Sensorium: Normal Semmes Weinstein monofilament test. Normal tactile sensation bilaterally. Nail Exam: Pt has thick disfigured discolored nails with subungual debris noted bilateral entire nail hallux through fifth toenails Ulcer Exam: There is no evidence of ulcer or pre-ulcerative changes or infection. Orthopedic Exam: Muscle tone and strength are WNL. No limitations in general ROM. No crepitus or effusions noted. Foot type and digits show no abnormalities. Bony prominences are unremarkable. Skin: No Porokeratosis. No infection or ulcers  Diagnosis:  Tinea unguium, Pain in right toe, pain in left toes  Treatment & Plan Procedures and Treatment: Consent by patient was obtained for treatment procedures. The patient understood the discussion of treatment and procedures well. All questions were answered thoroughly reviewed. Debridement of mycotic and hypertrophic toenails, 1 through 5 bilateral and clearing of subungual debris. No ulceration, no infection noted.  Return Visit-Office Procedure: Patient instructed to return to the office for a follow up visit 10 weeks for continued evaluation and treatment.   Margues Filippini DPM  

## 2016-10-17 ENCOUNTER — Ambulatory Visit: Payer: BC Managed Care – PPO | Admitting: Podiatry

## 2016-10-22 ENCOUNTER — Ambulatory Visit (INDEPENDENT_AMBULATORY_CARE_PROVIDER_SITE_OTHER): Payer: Medicare Other | Admitting: Podiatry

## 2016-10-22 DIAGNOSIS — M79676 Pain in unspecified toe(s): Secondary | ICD-10-CM

## 2016-10-22 DIAGNOSIS — Z794 Long term (current) use of insulin: Secondary | ICD-10-CM

## 2016-10-22 DIAGNOSIS — E1140 Type 2 diabetes mellitus with diabetic neuropathy, unspecified: Secondary | ICD-10-CM

## 2016-10-22 DIAGNOSIS — B351 Tinea unguium: Secondary | ICD-10-CM

## 2016-10-22 DIAGNOSIS — M2010 Hallux valgus (acquired), unspecified foot: Secondary | ICD-10-CM

## 2016-10-22 NOTE — Progress Notes (Signed)
Patient ID: Helen Richardson, female   DOB: [birth year], 83 y.o.   MRN: [id number] Complaint:  Visit Type: Patient returns to my office for continued preventative foot care services. Complaint: Patient states" my nails have grown long and thick and become painful to walk and wear shoes" Patient has been diagnosed with DM with no foot complications. She presents for preventative foot care services. No changes to ROS  Podiatric Exam: Vascular: dorsalis pedis and posterior tibial pulses are palpable bilateral. Capillary return is immediate. Temperature gradient is WNL. Skin turgor WNL  Sensorium: Normal Semmes Weinstein monofilament test. Normal tactile sensation bilaterally. Nail Exam: Pt has thick disfigured discolored nails with subungual debris noted bilateral entire nail hallux through fifth toenails Ulcer Exam: There is no evidence of ulcer or pre-ulcerative changes or infection. Orthopedic Exam: Muscle tone and strength are WNL. No limitations in general ROM. No crepitus or effusions noted. Foot type and digits show no abnormalities. Bony prominences are unremarkable. Skin: No Porokeratosis. No infection or ulcers  Diagnosis:  Tinea unguium, Pain in right toe, pain in left toes  Treatment & Plan Procedures and Treatment: Consent by patient was obtained for treatment procedures. The patient understood the discussion of treatment and procedures well. All questions were answered thoroughly reviewed. Debridement of mycotic and hypertrophic toenails, 1 through 5 bilateral and clearing of subungual debris. No ulceration, no infection noted.  Return Visit-Office Procedure: Patient instructed to return to the office for a follow up visit 10 weeks for continued evaluation and treatment.   [name] DPM

## 2016-12-31 ENCOUNTER — Ambulatory Visit (INDEPENDENT_AMBULATORY_CARE_PROVIDER_SITE_OTHER): Payer: Medicare Other | Admitting: Podiatry

## 2016-12-31 ENCOUNTER — Encounter: Payer: Self-pay | Admitting: Podiatry

## 2016-12-31 DIAGNOSIS — M2010 Hallux valgus (acquired), unspecified foot: Secondary | ICD-10-CM

## 2016-12-31 DIAGNOSIS — E1140 Type 2 diabetes mellitus with diabetic neuropathy, unspecified: Secondary | ICD-10-CM

## 2016-12-31 DIAGNOSIS — M79676 Pain in unspecified toe(s): Secondary | ICD-10-CM

## 2016-12-31 DIAGNOSIS — Z794 Long term (current) use of insulin: Secondary | ICD-10-CM

## 2016-12-31 DIAGNOSIS — B351 Tinea unguium: Secondary | ICD-10-CM

## 2016-12-31 NOTE — Progress Notes (Signed)
Patient ID: Helen Richardson, female   DOB: 01/07/1934, 83 y.o.   MRN: [id number] Complaint:  Visit Type: Patient returns to my office for continued preventative foot care services. Complaint: Patient states" my nails have grown long and thick and become painful to walk and wear shoes" Patient has been diagnosed with DM with neuropathy She presents for preventative foot care services. No changes to ROS  Podiatric Exam: Vascular: dorsalis pedis and posterior tibial pulses are palpable bilateral. Capillary return is immediate. Temperature gradient is WNL. Skin turgor WNL  Sensorium: Diminished  Semmes Weinstein monofilament test. Normal tactile sensation bilaterally. Nail Exam: Pt has thick disfigured discolored nails with subungual debris noted bilateral entire nail hallux through fifth toenails Ulcer Exam: There is no evidence of ulcer or pre-ulcerative changes or infection. Orthopedic Exam: Muscle tone and strength are WNL. No limitations in general ROM. No crepitus or effusions noted. HAV  B/L.  Hammer toes . Skin: No Porokeratosis. No infection or ulcers  Diagnosis:  Tinea unguium, Pain in right toe, pain in left toes  Treatment & Plan Procedures and Treatment: Consent by patient was obtained for treatment procedures. The patient understood the discussion of treatment and procedures well. All questions were answered thoroughly reviewed. Debridement of mycotic and hypertrophic toenails, 1 through 5 bilateral and clearing of subungual debris. No ulceration, no infection noted.  Return Visit-Office Procedure: Patient instructed to return to the office for a follow up visit 10 weeks for continued evaluation and treatment. Patient to return for diabetic shoes with Betha next week.   Helane GuntherGregory Shanita Kanan DPM

## 2017-01-07 ENCOUNTER — Other Ambulatory Visit: Payer: BC Managed Care – PPO

## 2017-02-04 ENCOUNTER — Ambulatory Visit (INDEPENDENT_AMBULATORY_CARE_PROVIDER_SITE_OTHER): Payer: Medicare Other | Admitting: Orthotics

## 2017-02-04 DIAGNOSIS — E1140 Type 2 diabetes mellitus with diabetic neuropathy, unspecified: Secondary | ICD-10-CM | POA: Diagnosis not present

## 2017-02-04 DIAGNOSIS — Z794 Long term (current) use of insulin: Secondary | ICD-10-CM

## 2017-02-04 DIAGNOSIS — M2010 Hallux valgus (acquired), unspecified foot: Secondary | ICD-10-CM | POA: Diagnosis not present

## 2017-02-11 NOTE — Progress Notes (Signed)
Patient had appointment today for definitive and final diabetic shoe fitting and delivery.  Patient was seen by Betha, C.Ped, [facility].   The inserts fit well and accomplished full contact with the plantar surface of the foot bilateral; the shoes fit well and offered forefoot freedom, no noticible heel slippage, and good toe clearance w/ the insert in place.  Patient was advised to monitor of any skin irritation, breakdown.  Patient was satisfied with fit and function. 

## 2017-03-13 ENCOUNTER — Ambulatory Visit (INDEPENDENT_AMBULATORY_CARE_PROVIDER_SITE_OTHER): Payer: Medicare Other | Admitting: Podiatry

## 2017-03-13 ENCOUNTER — Encounter: Payer: Self-pay | Admitting: Podiatry

## 2017-03-13 DIAGNOSIS — B351 Tinea unguium: Secondary | ICD-10-CM | POA: Diagnosis not present

## 2017-03-13 DIAGNOSIS — M79676 Pain in unspecified toe(s): Secondary | ICD-10-CM | POA: Diagnosis not present

## 2017-03-13 DIAGNOSIS — E1140 Type 2 diabetes mellitus with diabetic neuropathy, unspecified: Secondary | ICD-10-CM

## 2017-03-13 DIAGNOSIS — Z794 Long term (current) use of insulin: Secondary | ICD-10-CM

## 2017-03-13 NOTE — Progress Notes (Signed)
Patient ID: Helen Richardson, female   DOB: 07/04/1933, 84 y.o.   MRN: [id number] Complaint:  Visit Type: Patient returns to my office for continued preventative foot care services. Complaint: Patient states" my nails have grown long and thick and become painful to walk and wear shoes" Patient has been diagnosed with DM with neuropathy She presents for preventative foot care services. No changes to ROS  Podiatric Exam: Vascular: dorsalis pedis and posterior tibial pulses are palpable bilateral. Capillary return is immediate. Temperature gradient is WNL. Skin turgor WNL  Sensorium: Diminished  Semmes Weinstein monofilament test. Normal tactile sensation bilaterally. Nail Exam: Pt has thick disfigured discolored nails with subungual debris noted bilateral entire nail hallux through fifth toenails Ulcer Exam: There is no evidence of ulcer or pre-ulcerative changes or infection. Orthopedic Exam: Muscle tone and strength are WNL. No limitations in general ROM. No crepitus or effusions noted. HAV  B/L.  Hammer toes . Skin: No Porokeratosis. No infection or ulcers  Diagnosis:  Tinea unguium, Pain in right toe, pain in left toes  Treatment & Plan Procedures and Treatment: Consent by patient was obtained for treatment procedures. The patient understood the discussion of treatment and procedures well. All questions were answered thoroughly reviewed. Debridement of mycotic and hypertrophic toenails, 1 through 5 bilateral and clearing of subungual debris. No ulceration, no infection noted.  Return Visit-Office Procedure: Patient instructed to return to the office for a follow up visit 10 weeks for continued evaluation and treatment.    Margeaux Swantek DPM  

## 2017-06-12 ENCOUNTER — Ambulatory Visit (INDEPENDENT_AMBULATORY_CARE_PROVIDER_SITE_OTHER): Payer: Medicare Other | Admitting: Podiatry

## 2017-06-12 ENCOUNTER — Encounter: Payer: Self-pay | Admitting: Podiatry

## 2017-06-12 DIAGNOSIS — M79676 Pain in unspecified toe(s): Secondary | ICD-10-CM

## 2017-06-12 DIAGNOSIS — E1140 Type 2 diabetes mellitus with diabetic neuropathy, unspecified: Secondary | ICD-10-CM

## 2017-06-12 DIAGNOSIS — B351 Tinea unguium: Secondary | ICD-10-CM | POA: Diagnosis not present

## 2017-06-12 DIAGNOSIS — Z794 Long term (current) use of insulin: Secondary | ICD-10-CM

## 2017-06-12 NOTE — Progress Notes (Signed)
Patient ID: Helen Richardson, female   DOB: 07/15/1933, 84 y.o.   MRN: [id number] Complaint:  Visit Type: Patient returns to my office for continued preventative foot care services. Complaint: Patient states" my nails have grown long and thick and become painful to walk and wear shoes" Patient has been diagnosed with DM with neuropathy She presents for preventative foot care services. No changes to ROS  Podiatric Exam: Vascular: dorsalis pedis and posterior tibial pulses are palpable bilateral. Capillary return is immediate. Temperature gradient is WNL. Skin turgor WNL  Sensorium: Diminished  Semmes Weinstein monofilament test. Normal tactile sensation bilaterally. Nail Exam: Pt has thick disfigured discolored nails with subungual debris noted bilateral entire nail hallux through fifth toenails Ulcer Exam: There is no evidence of ulcer or pre-ulcerative changes or infection. Orthopedic Exam: Muscle tone and strength are WNL. No limitations in general ROM. No crepitus or effusions noted. HAV  B/L.  Hammer toes . Skin: No Porokeratosis. No infection or ulcers  Diagnosis:  Tinea unguium, Pain in right toe, pain in left toes  Treatment & Plan Procedures and Treatment: Consent by patient was obtained for treatment procedures. The patient understood the discussion of treatment and procedures well. All questions were answered thoroughly reviewed. Debridement of mycotic and hypertrophic toenails, 1 through 5 bilateral and clearing of subungual debris. No ulceration, no infection noted.  Return Visit-Office Procedure: Patient instructed to return to the office for a follow up visit 10 weeks for continued evaluation and treatment.    Gweneth Fredlund DPM  

## 2017-08-21 ENCOUNTER — Ambulatory Visit: Payer: Medicare Other | Admitting: Podiatry

## 2017-08-21 ENCOUNTER — Encounter: Payer: Self-pay | Admitting: Podiatry

## 2017-08-21 DIAGNOSIS — B351 Tinea unguium: Secondary | ICD-10-CM | POA: Diagnosis not present

## 2017-08-21 DIAGNOSIS — E1140 Type 2 diabetes mellitus with diabetic neuropathy, unspecified: Secondary | ICD-10-CM

## 2017-08-21 DIAGNOSIS — M79676 Pain in unspecified toe(s): Secondary | ICD-10-CM

## 2017-08-21 DIAGNOSIS — Z794 Long term (current) use of insulin: Secondary | ICD-10-CM

## 2017-08-21 NOTE — Progress Notes (Signed)
Patient ID: Helen Richardson, female   DOB: 11/14/1933, 84 y.o.   MRN: [id number] Complaint:  Visit Type: Patient returns to my office for continued preventative foot care services. Complaint: Patient states" my nails have grown long and thick and become painful to walk and wear shoes" Patient has been diagnosed with DM with neuropathy She presents for preventative foot care services. No changes to ROS  Podiatric Exam: Vascular: dorsalis pedis and posterior tibial pulses are palpable bilateral. Capillary return is immediate. Temperature gradient is WNL. Skin turgor WNL  Sensorium: Diminished  Semmes Weinstein monofilament test. Normal tactile sensation bilaterally. Nail Exam: Pt has thick disfigured discolored nails with subungual debris noted bilateral entire nail hallux through fifth toenails Ulcer Exam: There is no evidence of ulcer or pre-ulcerative changes or infection. Orthopedic Exam: Muscle tone and strength are WNL. No limitations in general ROM. No crepitus or effusions noted. HAV  B/L.  Hammer toes . Skin: No Porokeratosis. No infection or ulcers  Diagnosis:  Tinea unguium, Pain in right toe, pain in left toes  Treatment & Plan Procedures and Treatment: Consent by patient was obtained for treatment procedures. The patient understood the discussion of treatment and procedures well. All questions were answered thoroughly reviewed. Debridement of mycotic and hypertrophic toenails, 1 through 5 bilateral and clearing of subungual debris. No ulceration, no infection noted.  Return Visit-Office Procedure: Patient instructed to return to the office for a follow up visit 10 weeks for continued evaluation and treatment.    Alvah Lagrow DPM  

## 2017-10-30 ENCOUNTER — Encounter: Payer: Self-pay | Admitting: Podiatry

## 2017-10-30 ENCOUNTER — Ambulatory Visit (INDEPENDENT_AMBULATORY_CARE_PROVIDER_SITE_OTHER): Payer: Medicare Other | Admitting: Podiatry

## 2017-10-30 DIAGNOSIS — Z794 Long term (current) use of insulin: Secondary | ICD-10-CM

## 2017-10-30 DIAGNOSIS — E1140 Type 2 diabetes mellitus with diabetic neuropathy, unspecified: Secondary | ICD-10-CM

## 2017-10-30 DIAGNOSIS — M79676 Pain in unspecified toe(s): Secondary | ICD-10-CM

## 2017-10-30 DIAGNOSIS — B351 Tinea unguium: Secondary | ICD-10-CM

## 2017-10-30 NOTE — Progress Notes (Signed)
Patient ID: Helen Richardson, female   DOB: 02/10/1934, 84 y.o.   MRN: [id number] Complaint:  Visit Type: Patient returns to my office for continued preventative foot care services. Complaint: Patient states" my nails have grown long and thick and become painful to walk and wear shoes" Patient has been diagnosed with DM with neuropathy She presents for preventative foot care services. No changes to ROS  Podiatric Exam: Vascular: dorsalis pedis and posterior tibial pulses are palpable bilateral. Capillary return is immediate. Temperature gradient is WNL. Skin turgor WNL  Sensorium: Diminished  Semmes Weinstein monofilament test. Normal tactile sensation bilaterally. Nail Exam: Pt has thick disfigured discolored nails with subungual debris noted bilateral entire nail hallux through fifth toenails Ulcer Exam: There is no evidence of ulcer or pre-ulcerative changes or infection. Orthopedic Exam: Muscle tone and strength are WNL. No limitations in general ROM. No crepitus or effusions noted. HAV  B/L.  Hammer toes . Skin: No Porokeratosis. No infection or ulcers  Diagnosis:  Tinea unguium, Pain in right toe, pain in left toes  Treatment & Plan Procedures and Treatment: Consent by patient was obtained for treatment procedures. The patient understood the discussion of treatment and procedures well. All questions were answered thoroughly reviewed. Debridement of mycotic and hypertrophic toenails, 1 through 5 bilateral and clearing of subungual debris. No ulceration, no infection noted.  Return Visit-Office Procedure: Patient instructed to return to the office for a follow up visit 10 weeks for continued evaluation and treatment.    Sylvania Moss DPM  

## 2017-11-19 DIAGNOSIS — Z7982 Long term (current) use of aspirin: Secondary | ICD-10-CM | POA: Diagnosis not present

## 2017-11-19 DIAGNOSIS — Z23 Encounter for immunization: Secondary | ICD-10-CM | POA: Diagnosis not present

## 2017-11-19 DIAGNOSIS — E1121 Type 2 diabetes mellitus with diabetic nephropathy: Secondary | ICD-10-CM | POA: Diagnosis not present

## 2017-11-30 ENCOUNTER — Other Ambulatory Visit: Payer: Self-pay | Admitting: Internal Medicine

## 2017-11-30 DIAGNOSIS — I129 Hypertensive chronic kidney disease with stage 1 through stage 4 chronic kidney disease, or unspecified chronic kidney disease: Secondary | ICD-10-CM | POA: Diagnosis not present

## 2017-11-30 DIAGNOSIS — Z79899 Other long term (current) drug therapy: Secondary | ICD-10-CM

## 2017-11-30 DIAGNOSIS — N08 Glomerular disorders in diseases classified elsewhere: Secondary | ICD-10-CM | POA: Diagnosis not present

## 2017-11-30 DIAGNOSIS — N183 Chronic kidney disease, stage 3 (moderate): Secondary | ICD-10-CM | POA: Diagnosis not present

## 2017-11-30 DIAGNOSIS — E1122 Type 2 diabetes mellitus with diabetic chronic kidney disease: Secondary | ICD-10-CM | POA: Diagnosis not present

## 2017-11-30 DIAGNOSIS — Z6836 Body mass index (BMI) 36.0-36.9, adult: Secondary | ICD-10-CM

## 2017-12-01 LAB — COMPREHENSIVE METABOLIC PANEL
ALT: 13 IU/L (ref 0–32)
AST: 16 IU/L (ref 0–40)
Albumin/Globulin Ratio: 1.7 (ref 1.2–2.2)
Albumin: 4 g/dL (ref 3.5–4.7)
Alkaline Phosphatase: 57 IU/L (ref 39–117)
BUN/Creatinine Ratio: 16 (ref 12–28)
BUN: 15 mg/dL (ref 8–27)
Bilirubin Total: 0.3 mg/dL (ref 0.0–1.2)
CO2: 27 mmol/L (ref 20–29)
Calcium: 9.3 mg/dL (ref 8.7–10.3)
Chloride: 102 mmol/L (ref 96–106)
Creatinine, Ser: 0.96 mg/dL (ref 0.57–1.00)
GFR calc Af Amer: 64 mL/min/{1.73_m2} (ref >59)
GFR calc non Af Amer: 55 mL/min/{1.73_m2} — ABNORMAL LOW (ref >59)
Globulin, Total: 2.4 g/dL (ref 1.5–4.5)
Glucose: 113 mg/dL — ABNORMAL HIGH (ref 65–99)
Potassium: 5.2 mmol/L (ref 3.5–5.2)
Sodium: 142 mmol/L (ref 134–144)
Total Protein: 6.4 g/dL (ref 6.0–8.5)

## 2017-12-01 LAB — HGB A1C W/O EAG: Hgb A1c MFr Bld: 9.6 % — ABNORMAL HIGH (ref 4.8–5.6)

## 2017-12-01 LAB — LIPID PANEL WITH LDL/HDL RATIO
Cholesterol, Total: 133 mg/dL (ref 100–199)
HDL: 78 mg/dL (ref >39)
LDL Calculated: 47 mg/dL (ref 0–99)
LDl/HDL Ratio: 0.6 ratio (ref 0.0–3.2)
Triglycerides: 38 mg/dL (ref 0–149)
VLDL Cholesterol Cal: 8 mg/dL (ref 5–40)

## 2017-12-06 NOTE — Progress Notes (Signed)
Pt given results in [employer].

## 2017-12-20 ENCOUNTER — Other Ambulatory Visit: Payer: Self-pay | Admitting: Internal Medicine

## 2017-12-29 ENCOUNTER — Other Ambulatory Visit: Payer: Self-pay | Admitting: Internal Medicine

## 2018-01-19 ENCOUNTER — Other Ambulatory Visit: Payer: Self-pay | Admitting: Internal Medicine

## 2018-01-22 ENCOUNTER — Ambulatory Visit: Payer: Medicare Other | Admitting: Podiatry

## 2018-01-26 ENCOUNTER — Encounter: Payer: Self-pay | Admitting: Podiatry

## 2018-01-26 ENCOUNTER — Ambulatory Visit: Payer: Medicare Other | Admitting: Podiatry

## 2018-01-26 DIAGNOSIS — B351 Tinea unguium: Secondary | ICD-10-CM

## 2018-01-26 DIAGNOSIS — M2010 Hallux valgus (acquired), unspecified foot: Secondary | ICD-10-CM

## 2018-01-26 DIAGNOSIS — E1140 Type 2 diabetes mellitus with diabetic neuropathy, unspecified: Secondary | ICD-10-CM

## 2018-01-26 DIAGNOSIS — M2042 Other hammer toe(s) (acquired), left foot: Secondary | ICD-10-CM

## 2018-01-26 DIAGNOSIS — M79676 Pain in unspecified toe(s): Secondary | ICD-10-CM | POA: Diagnosis not present

## 2018-01-26 DIAGNOSIS — M2041 Other hammer toe(s) (acquired), right foot: Secondary | ICD-10-CM

## 2018-01-26 DIAGNOSIS — Z794 Long term (current) use of insulin: Secondary | ICD-10-CM

## 2018-01-26 NOTE — Progress Notes (Signed)
Patient ID: Helen Richardson, female   DOB: 11/22/1933, 84 y.o.   MRN: [id number] Complaint:  Visit Type: Patient returns to my office for continued preventative foot care services. Complaint: Patient states" my nails have grown long and thick and become painful to walk and wear shoes" Patient has been diagnosed with DM with neuropathy She presents for preventative foot care services. No changes to ROS  Podiatric Exam: Vascular: dorsalis pedis and posterior tibial pulses are palpable bilateral. Capillary return is immediate. Temperature gradient is WNL. Skin turgor WNL  Sensorium: Diminished  Semmes Weinstein monofilament test. Normal tactile sensation bilaterally. Nail Exam: Pt has thick disfigured discolored nails with subungual debris noted bilateral entire nail hallux through fifth toenails Ulcer Exam: There is no evidence of ulcer or pre-ulcerative changes or infection. Orthopedic Exam: Muscle tone and strength are WNL. No limitations in general ROM. No crepitus or effusions noted. HAV  B/L.  Hammer toes . Skin: No Porokeratosis. No infection or ulcers  Diagnosis:  Tinea unguium, Pain in right toe, pain in left toes  Treatment & Plan Procedures and Treatment: Consent by patient was obtained for treatment procedures. The patient understood the discussion of treatment and procedures well. All questions were answered thoroughly reviewed. Debridement of mycotic and hypertrophic toenails, 1 through 5 bilateral and clearing of subungual debris. No ulceration, no infection noted.  Return Visit-Office Procedure: Patient instructed to return to the office for a follow up visit 10 weeks for continued evaluation and treatment.    Helane GuntherGregory Claron Rosencrans DPM

## 2018-02-03 ENCOUNTER — Other Ambulatory Visit: Payer: Medicare Other | Admitting: Orthotics

## 2018-02-08 ENCOUNTER — Other Ambulatory Visit: Payer: Self-pay | Admitting: Internal Medicine

## 2018-02-18 ENCOUNTER — Other Ambulatory Visit: Payer: Self-pay | Admitting: Internal Medicine

## 2018-02-22 ENCOUNTER — Other Ambulatory Visit: Payer: Self-pay | Admitting: Internal Medicine

## 2018-03-08 ENCOUNTER — Ambulatory Visit: Payer: Medicare Other | Admitting: Internal Medicine

## 2018-03-08 ENCOUNTER — Encounter: Payer: Self-pay | Admitting: Internal Medicine

## 2018-03-08 VITALS — BP 116/74 | HR 91 | Temp 97.8°F | Ht 60.0 in | Wt 222.4 lb

## 2018-03-08 DIAGNOSIS — Z6841 Body Mass Index (BMI) 40.0 and over, adult: Secondary | ICD-10-CM

## 2018-03-08 DIAGNOSIS — E7800 Pure hypercholesterolemia, unspecified: Secondary | ICD-10-CM

## 2018-03-08 DIAGNOSIS — E6609 Other obesity due to excess calories: Secondary | ICD-10-CM | POA: Insufficient documentation

## 2018-03-08 DIAGNOSIS — R202 Paresthesia of skin: Secondary | ICD-10-CM

## 2018-03-08 DIAGNOSIS — N183 Chronic kidney disease, stage 3 unspecified: Secondary | ICD-10-CM

## 2018-03-08 DIAGNOSIS — I129 Hypertensive chronic kidney disease with stage 1 through stage 4 chronic kidney disease, or unspecified chronic kidney disease: Secondary | ICD-10-CM | POA: Diagnosis not present

## 2018-03-08 DIAGNOSIS — Z794 Long term (current) use of insulin: Secondary | ICD-10-CM

## 2018-03-08 DIAGNOSIS — E1122 Type 2 diabetes mellitus with diabetic chronic kidney disease: Secondary | ICD-10-CM | POA: Diagnosis not present

## 2018-03-08 MED ORDER — TETANUS-DIPHTH-ACELL PERTUSSIS 5-2-15.5 LF-MCG/0.5 IM SUSP: 0.5000 mL | Freq: Once | INTRAMUSCULAR | 0 refills | Status: AC

## 2018-03-08 NOTE — Progress Notes (Signed)
Subjective:     Patient ID: Helen Richardson , female    DOB: 06-19-1933 , 85 y.o.   MRN: [id number]   Chief Complaint  Patient presents with  . Diabetes  . Hypertension    HPI  Diabetes  She presents for her follow-up diabetic visit. She has type 2 diabetes mellitus. Her disease course has been improving. There are no hypoglycemic associated symptoms. Pertinent negatives for diabetes include no blurred vision and no chest pain. There are no hypoglycemic complications. Risk factors for coronary artery disease include diabetes mellitus, dyslipidemia, hypertension, obesity, sedentary lifestyle and post-menopausal. Eye exam is current.  Hypertension  This is a chronic problem. The current episode started more than 1 year ago. The problem has been gradually improving since onset. The problem is controlled. Pertinent negatives include no blurred vision, chest pain, palpitations or shortness of breath.   She reports compliance with meds.   Past Medical History:  Diagnosis Date  . Diabetes mellitus without complication ([place])   . Obesity      Family History  Problem Relation Age of Onset  . Diabetes Mother   . Hypertension Mother      Current Outpatient Medications:  .  ADVOCATE LANCETS MISC, , Disp: , Rfl:  .  Alcohol Swabs (ALCOHOL PREP) 70 % PADS, , Disp: , Rfl:  .  carvedilol (COREG) 6.25 MG tablet, TAKE 1 TABLET BY MOUTH TWICE DAILY, Disp: 60 tablet, Rfl: 4 .  COMFORT EZ PEN NEEDLES 32G X 4 MM MISC, , Disp: , Rfl: 5 .  furosemide (LASIX) 40 MG tablet, , Disp: , Rfl:  .  HUMALOG MIX 75/25 KWIKPEN (75-25) 100 UNIT/ML Kwikpen, INJECT 20 UNITS SUBCUTANEOUSLY BEFORE BREAKFAST AND DINNER.(DO NOT EXCEED MAX DOSE OF 50 UNITS A DAY, Disp: 15 mL, Rfl: 0 .  Lancet Devices (ADVOCATE LANCING DEVICE) MISC, , Disp: , Rfl:  .  potassium chloride SA (K-DUR,KLOR-CON) 20 MEQ tablet, TAKE 1 TABLET BY MOUTH TWICE A DAY, Disp: 60 tablet, Rfl: 5 .  rosuvastatin (CRESTOR) 20 MG tablet, TAKE 1 TABLET BY  MOUTH EVERY DAY, Disp: 90 tablet, Rfl: 0 .  telmisartan (MICARDIS) 80 MG tablet, Take 80 mg by mouth daily., Disp: , Rfl: 5 .  TRADJENTA 5 MG TABS tablet, Take 5 mg by mouth daily., Disp: , Rfl: 4 .  Tdap (ADACEL) 07-02-13.5 LF-MCG/0.5 injection, Inject 0.5 mLs into the muscle once for 1 dose., Disp: 0.5 mL, Rfl: 0   No Known Allergies   Review of Systems  Constitutional: Negative.   Eyes: Negative for blurred vision.  Respiratory: Negative.  Negative for shortness of breath.   Cardiovascular: Negative.  Negative for chest pain and palpitations.  Gastrointestinal: Negative.   Neurological: Positive for numbness (she c/o intermittent numbness in her hands. unable to determine her triggers. she denies b/l UE weakness .).  Psychiatric/Behavioral: Negative.      Today's Vitals   03/08/18 1456  BP: 116/74  Pulse: 91  Temp: 97.8 F (36.6 C)  TempSrc: Oral  Weight: 222 lb 6.4 oz (100.9 kg)  Height: 5' (1.524 m)  PainSc: 0-No pain   Body mass index is 43.43 kg/m.   Objective:  Physical Exam Vitals signs and nursing note reviewed.  Constitutional:      Appearance: Normal appearance. She is obese.  HENT:     Head: Normocephalic and atraumatic.  Cardiovascular:     Rate and Rhythm: Normal rate and regular rhythm.     Heart sounds: Normal heart sounds.  Pulmonary:     Effort: Pulmonary effort is normal.     Breath sounds: Normal breath sounds.  Skin:    General: Skin is warm.  Neurological:     Mental Status: She is alert.     Deep Tendon Reflexes: Abnormal reflex:   Psychiatric:        Mood and Affect: Mood normal.         Assessment And Plan:     1. Type 2 diabetes mellitus with stage 3 chronic kidney disease, with long-term current use of insulin ([place])  I will check labs as listed below. Her BS log was reviewed. I will make further recommendations once her labs are reviewed.   - CMP14+EGFR - Hemoglobin A1c - Lipid Profile - Vitamin B12  2. Parenchymal renal  hypertension, stage 1 through stage 4 or unspecified chronic kidney disease  Well controlled. She will continue with current meds. She is encouraged to avoid adding salt to her foods.  3. Pure hypercholesterolemia  I will check non-fasting lipid panel today. She is encouraged to avoid fried foods and to increase her daily activity level.   4. Paresthesia of both hands  Patient advised that her sx could be due to diabetic neuropathy. However, I will also check vitamin B12 level today. I will make further recommendations once her labs are available.  - Vitamin B12  5. Class 3 severe obesity due to excess calories with serious comorbidity and body mass index (BMI) of 40.0 to 44.9 in adult [facility])  She is encouraged to strive for BMI less than 38 to decrease cardiac risk. She is encouraged to increase exercise. She is encouraged to start with 10 minutes three days per week and to gradually build up to 30 minutes four to five days weekly.   Maximino Greenland, MD

## 2018-03-08 NOTE — Patient Instructions (Signed)
Diabetes Mellitus and Nutrition, Adult  When you have diabetes (diabetes mellitus), it is very important to have healthy eating habits because your blood sugar (glucose) levels are greatly affected by what you eat and drink. Eating healthy foods in the appropriate amounts, at about the same times every day, can help you:  · Control your blood glucose.  · Lower your risk of heart disease.  · Improve your blood pressure.  · Reach or maintain a healthy weight.  Every person with diabetes is different, and each person has different needs for a meal plan. Your health care provider may recommend that you work with a diet and nutrition specialist (dietitian) to make a meal plan that is best for you. Your meal plan may vary depending on factors such as:  · The calories you need.  · The medicines you take.  · Your weight.  · Your blood glucose, blood pressure, and cholesterol levels.  · Your activity level.  · Other health conditions you have, such as heart or kidney disease.  How do carbohydrates affect me?  Carbohydrates, also called carbs, affect your blood glucose level more than any other type of food. Eating carbs naturally raises the amount of glucose in your blood. Carb counting is a method for keeping track of how many carbs you eat. Counting carbs is important to keep your blood glucose at a healthy level, especially if you use insulin or take certain oral diabetes medicines.  It is important to know how many carbs you can safely have in each meal. This is different for every person. Your dietitian can help you calculate how many carbs you should have at each meal and for each snack.  Foods that contain carbs include:  · Bread, cereal, rice, pasta, and crackers.  · Potatoes and corn.  · Peas, beans, and lentils.  · Milk and yogurt.  · Fruit and juice.  · Desserts, such as cakes, cookies, ice cream, and candy.  How does alcohol affect me?  Alcohol can cause a sudden decrease in blood glucose (hypoglycemia),  especially if you use insulin or take certain oral diabetes medicines. Hypoglycemia can be a life-threatening condition. Symptoms of hypoglycemia (sleepiness, dizziness, and confusion) are similar to symptoms of having too much alcohol.  If your health care provider says that alcohol is safe for you, follow these guidelines:  · Limit alcohol intake to no more than 1 drink per day for nonpregnant women and 2 drinks per day for men. One drink equals 12 oz of beer, 5 oz of wine, or 1½ oz of hard liquor.  · Do not drink on an empty stomach.  · Keep yourself hydrated with water, diet soda, or unsweetened iced tea.  · Keep in mind that regular soda, juice, and other mixers may contain a lot of sugar and must be counted as carbs.  What are tips for following this plan?    Reading food labels  · Start by checking the serving size on the "Nutrition Facts" label of packaged foods and drinks. The amount of calories, carbs, fats, and other nutrients listed on the label is based on one serving of the item. Many items contain more than one serving per package.  · Check the total grams (g) of carbs in one serving. You can calculate the number of servings of carbs in one serving by dividing the total carbs by 15. For example, if a food has 30 g of total carbs, it would be equal to 2   servings of carbs.  · Check the number of grams (g) of saturated and trans fats in one serving. Choose foods that have low or no amount of these fats.  · Check the number of milligrams (mg) of salt (sodium) in one serving. Most people should limit total sodium intake to less than 2,300 mg per day.  · Always check the nutrition information of foods labeled as "low-fat" or "nonfat". These foods may be higher in added sugar or refined carbs and should be avoided.  · Talk to your dietitian to identify your daily goals for nutrients listed on the label.  Shopping  · Avoid buying canned, premade, or processed foods. These foods tend to be high in fat, sodium,  and added sugar.  · Shop around the outside edge of the grocery store. This includes fresh fruits and vegetables, bulk grains, fresh meats, and fresh dairy.  Cooking  · Use low-heat cooking methods, such as baking, instead of high-heat cooking methods like deep frying.  · Cook using healthy oils, such as olive, canola, or sunflower oil.  · Avoid cooking with butter, cream, or high-fat meats.  Meal planning  · Eat meals and snacks regularly, preferably at the same times every day. Avoid going long periods of time without eating.  · Eat foods high in fiber, such as fresh fruits, vegetables, beans, and whole grains. Talk to your dietitian about how many servings of carbs you can eat at each meal.  · Eat 4-6 ounces (oz) of lean protein each day, such as lean meat, chicken, fish, eggs, or tofu. One oz of lean protein is equal to:  ? 1 oz of meat, chicken, or fish.  ? 1 egg.  ? ¼ cup of tofu.  · Eat some foods each day that contain healthy fats, such as avocado, nuts, seeds, and fish.  Lifestyle  · Check your blood glucose regularly.  · Exercise regularly as told by your health care provider. This may include:  ? 150 minutes of moderate-intensity or vigorous-intensity exercise each week. This could be brisk walking, biking, or water aerobics.  ? Stretching and doing strength exercises, such as yoga or weightlifting, at least 2 times a week.  · Take medicines as told by your health care provider.  · Do not use any products that contain nicotine or tobacco, such as cigarettes and e-cigarettes. If you need help quitting, ask your health care provider.  · Work with a counselor or diabetes educator to identify strategies to manage stress and any emotional and social challenges.  Questions to ask a health care provider  · Do I need to meet with a diabetes educator?  · Do I need to meet with a dietitian?  · What number can I call if I have questions?  · When are the best times to check my blood glucose?  Where to find more  information:  · American Diabetes Association: diabetes.org  · Academy of Nutrition and Dietetics: www.eatright.org  · National Institute of Diabetes and Digestive and Kidney Diseases (NIH): www.niddk.nih.gov  Summary  · A healthy meal plan will help you control your blood glucose and maintain a healthy lifestyle.  · Working with a diet and nutrition specialist (dietitian) can help you make a meal plan that is best for you.  · Keep in mind that carbohydrates (carbs) and alcohol have immediate effects on your blood glucose levels. It is important to count carbs and to use alcohol carefully.  This information is not intended to   replace advice given to you by your health care provider. Make sure you discuss any questions you have with your health care provider.  Document Released: 11/14/2004 Document Revised: 09/17/2016 Document Reviewed: 03/24/2016  Elsevier Interactive Patient Education © 2019 [employer]

## 2018-03-09 ENCOUNTER — Other Ambulatory Visit: Payer: Self-pay | Admitting: Internal Medicine

## 2018-03-09 LAB — LIPID PANEL
Chol/HDL Ratio: 1.7 ratio (ref 0.0–4.4)
Cholesterol, Total: 124 mg/dL (ref 100–199)
HDL: 73 mg/dL (ref >39)
LDL Calculated: 39 mg/dL (ref 0–99)
Triglycerides: 59 mg/dL (ref 0–149)
VLDL Cholesterol Cal: 12 mg/dL (ref 5–40)

## 2018-03-09 LAB — CMP14+EGFR
ALT: 14 IU/L (ref 0–32)
AST: 18 IU/L (ref 0–40)
Albumin/Globulin Ratio: 1.5 (ref 1.2–2.2)
Albumin: 3.7 g/dL (ref 3.5–4.7)
Alkaline Phosphatase: 66 IU/L (ref 39–117)
BUN/Creatinine Ratio: 16 (ref 12–28)
BUN: 16 mg/dL (ref 8–27)
Bilirubin Total: 0.3 mg/dL (ref 0.0–1.2)
CO2: 27 mmol/L (ref 20–29)
Calcium: 9.3 mg/dL (ref 8.7–10.3)
Chloride: 100 mmol/L (ref 96–106)
Creatinine, Ser: 1 mg/dL (ref 0.57–1.00)
GFR calc Af Amer: 60 mL/min/{1.73_m2} (ref >59)
GFR calc non Af Amer: 52 mL/min/{1.73_m2} — ABNORMAL LOW (ref >59)
Globulin, Total: 2.5 g/dL (ref 1.5–4.5)
Glucose: 190 mg/dL — ABNORMAL HIGH (ref 65–99)
Potassium: 5.1 mmol/L (ref 3.5–5.2)
Sodium: 138 mmol/L (ref 134–144)
Total Protein: 6.2 g/dL (ref 6.0–8.5)

## 2018-03-09 LAB — HEMOGLOBIN A1C
Est. average glucose Bld gHb Est-mCnc: 252 mg/dL
Hgb A1c MFr Bld: 10.4 % — ABNORMAL HIGH (ref 4.8–5.6)

## 2018-03-09 LAB — VITAMIN B12: Vitamin B-12: 1417 pg/mL — ABNORMAL HIGH (ref 232–1245)

## 2018-03-09 NOTE — Progress Notes (Signed)
Your liver and kidney fxn  Are nl. Your a1c is 10.4. this is much too high! I will refer you to an endocrinologist for further evaluation. Be sure to check sugars before breakfast and before dinner. Your chol is great. Continue with current meds. Your b12 levels are high. Are you taking a b-12 supplement? The elevated levels can also be due to a sluggish liver. pls increase exrecise

## 2018-03-10 ENCOUNTER — Telehealth: Payer: Self-pay

## 2018-03-10 NOTE — Telephone Encounter (Signed)
-----   Message from Dorothyann Pengobyn Sanders, MD sent at 03/09/2018  2:26 PM EST ----- Your liver and kidney fxn  Are nl. Your a1c is 10.4. this is much too high! I will refer you to an endocrinologist for further evaluation. Be sure to check sugars before breakfast and before dinner. Your chol is great. Continue with current meds. Your b12 levels are high. Are you taking a b-12 supplement? The elevated levels can also be due to a sluggish liver. pls increase exrecise

## 2018-03-10 NOTE — Telephone Encounter (Signed)
Left the pt a message to call the office back for her recent lab results.

## 2018-03-24 ENCOUNTER — Other Ambulatory Visit: Payer: Self-pay | Admitting: Internal Medicine

## 2018-03-24 ENCOUNTER — Ambulatory Visit: Payer: Medicare Other | Admitting: Orthotics

## 2018-03-25 ENCOUNTER — Ambulatory Visit (INDEPENDENT_AMBULATORY_CARE_PROVIDER_SITE_OTHER): Payer: Medicare Other | Admitting: Orthotics

## 2018-03-25 DIAGNOSIS — E1140 Type 2 diabetes mellitus with diabetic neuropathy, unspecified: Secondary | ICD-10-CM

## 2018-03-25 DIAGNOSIS — M2041 Other hammer toe(s) (acquired), right foot: Secondary | ICD-10-CM

## 2018-03-25 DIAGNOSIS — Z794 Long term (current) use of insulin: Secondary | ICD-10-CM

## 2018-03-25 DIAGNOSIS — N183 Chronic kidney disease, stage 3 unspecified: Secondary | ICD-10-CM

## 2018-03-25 DIAGNOSIS — M2042 Other hammer toe(s) (acquired), left foot: Secondary | ICD-10-CM

## 2018-03-25 DIAGNOSIS — E1122 Type 2 diabetes mellitus with diabetic chronic kidney disease: Secondary | ICD-10-CM

## 2018-03-25 DIAGNOSIS — M2010 Hallux valgus (acquired), unspecified foot: Secondary | ICD-10-CM

## 2018-03-25 NOTE — Progress Notes (Signed)

## 2018-04-15 ENCOUNTER — Other Ambulatory Visit: Payer: Self-pay | Admitting: Internal Medicine

## 2018-04-27 ENCOUNTER — Encounter: Payer: Self-pay | Admitting: Podiatry

## 2018-04-27 ENCOUNTER — Ambulatory Visit: Payer: Medicare Other | Admitting: Podiatry

## 2018-04-27 DIAGNOSIS — N183 Chronic kidney disease, stage 3 unspecified: Secondary | ICD-10-CM

## 2018-04-27 DIAGNOSIS — B351 Tinea unguium: Secondary | ICD-10-CM | POA: Diagnosis not present

## 2018-04-27 DIAGNOSIS — M79676 Pain in unspecified toe(s): Secondary | ICD-10-CM

## 2018-04-27 DIAGNOSIS — E1122 Type 2 diabetes mellitus with diabetic chronic kidney disease: Secondary | ICD-10-CM

## 2018-04-27 DIAGNOSIS — Z794 Long term (current) use of insulin: Secondary | ICD-10-CM

## 2018-04-27 NOTE — Progress Notes (Signed)
Patient ID: SUMEYA SISE, female   DOB: [birth year], 85 y.o.   MRN: [id number] Complaint:  Visit Type: Patient returns to my office for continued preventative foot care services. Complaint: Patient states" my nails have grown long and thick and become painful to walk and wear shoes" Patient has been diagnosed with DM with neuropathy She presents for preventative foot care services. No changes to ROS  Podiatric Exam: Vascular: dorsalis pedis and posterior tibial pulses are palpable bilateral. Capillary return is immediate. Temperature gradient is WNL. Skin turgor WNL  Sensorium: Diminished  Semmes Weinstein monofilament test. Normal tactile sensation bilaterally. Nail Exam: Pt has thick disfigured discolored nails with subungual debris noted bilateral entire nail hallux through fifth toenails Ulcer Exam: There is no evidence of ulcer or pre-ulcerative changes or infection. Orthopedic Exam: Muscle tone and strength are WNL. No limitations in general ROM. No crepitus or effusions noted. HAV  B/L.  Hammer toes . Skin: No Porokeratosis. No infection or ulcers  Diagnosis:  Tinea unguium, Pain in right toe, pain in left toes  Treatment & Plan Procedures and Treatment: Consent by patient was obtained for treatment procedures. The patient understood the discussion of treatment and procedures well. All questions were answered thoroughly reviewed. Debridement of mycotic and hypertrophic toenails, 1 through 5 bilateral and clearing of subungual debris. No ulceration, no infection noted.  Patient had a bad cramp in right upper leg.  Dr.  Ardelle Anton came in to check on patient.  Once she sat regularly her cramp stopped.  I told her to go to the ED if problem persists. Return Visit-Office Procedure: Patient instructed to return to the office for a follow up visit 10 weeks for continued evaluation and treatment.    [name] DPM

## 2018-04-28 ENCOUNTER — Telehealth: Payer: Self-pay | Admitting: *Deleted

## 2018-04-28 ENCOUNTER — Encounter: Payer: Self-pay | Admitting: *Deleted

## 2018-04-28 NOTE — Telephone Encounter (Signed)
I called pt, asked how she was doing, pt states she is some better the right upper leg still hurts. I offered to schedule pt to have her circulation tested. Pt states she doesn't want to have the test, because she can't drive there. I asked pt if I could call her son. Pt states son is out-of-town and she has no one to drive her. Pt asked if I could let Dr. Allyne Gee know. I told pt I would send the last office note with letter to Dr. Grayling Congress.

## 2018-04-28 NOTE — Telephone Encounter (Signed)
Faxed 04/27/2018 clinicals and 04/28/2018 letter to Dr. Dorothyann Peng.

## 2018-04-28 NOTE — Telephone Encounter (Signed)
Dr. Stacie Acres states pt had severe cramping yesterday and Dr. Ardelle Anton recommended arterial doppler, pt refused at the appt. Dr. Stacie Acres states he would like to know how the pt was doing.

## 2018-05-14 ENCOUNTER — Other Ambulatory Visit: Payer: Self-pay

## 2018-05-14 ENCOUNTER — Emergency Department (HOSPITAL_COMMUNITY): Payer: Medicare Other

## 2018-05-14 ENCOUNTER — Emergency Department (HOSPITAL_COMMUNITY)
Admission: EM | Admit: 2018-05-14 | Discharge: 2018-05-14 | Disposition: A | Discharge status: Home / Self Care | Payer: Medicare Other | Attending: Emergency Medicine | Admitting: Emergency Medicine

## 2018-05-14 ENCOUNTER — Encounter (HOSPITAL_COMMUNITY): Payer: Self-pay

## 2018-05-14 DIAGNOSIS — E119 Type 2 diabetes mellitus without complications: Secondary | ICD-10-CM | POA: Insufficient documentation

## 2018-05-14 DIAGNOSIS — Z794 Long term (current) use of insulin: Secondary | ICD-10-CM | POA: Insufficient documentation

## 2018-05-14 DIAGNOSIS — K5900 Constipation, unspecified: Secondary | ICD-10-CM | POA: Insufficient documentation

## 2018-05-14 DIAGNOSIS — N3000 Acute cystitis without hematuria: Secondary | ICD-10-CM

## 2018-05-14 DIAGNOSIS — E7800 Pure hypercholesterolemia, unspecified: Secondary | ICD-10-CM | POA: Insufficient documentation

## 2018-05-14 DIAGNOSIS — Z7982 Long term (current) use of aspirin: Secondary | ICD-10-CM | POA: Diagnosis not present

## 2018-05-14 DIAGNOSIS — Z79899 Other long term (current) drug therapy: Secondary | ICD-10-CM | POA: Diagnosis not present

## 2018-05-14 DIAGNOSIS — R1030 Lower abdominal pain, unspecified: Secondary | ICD-10-CM | POA: Diagnosis present

## 2018-05-14 LAB — LIPASE, BLOOD: Lipase: 24 U/L (ref 11–51)

## 2018-05-14 LAB — COMPREHENSIVE METABOLIC PANEL
ALT: 12 U/L (ref 0–44)
AST: 28 U/L (ref 15–41)
Albumin: 3.5 g/dL (ref 3.5–5.0)
Alkaline Phosphatase: 80 U/L (ref 38–126)
Anion gap: 11 (ref 5–15)
BUN: 9 mg/dL (ref 8–23)
CO2: 23 mmol/L (ref 22–32)
Calcium: 9.5 mg/dL (ref 8.9–10.3)
Chloride: 103 mmol/L (ref 98–111)
Creatinine, Ser: 1.13 mg/dL — ABNORMAL HIGH (ref 0.44–1.00)
GFR calc Af Amer: 52 mL/min — ABNORMAL LOW (ref >60)
GFR calc non Af Amer: 45 mL/min — ABNORMAL LOW (ref >60)
Glucose, Bld: 157 mg/dL — ABNORMAL HIGH (ref 70–99)
Potassium: 3.6 mmol/L (ref 3.5–5.1)
Sodium: 137 mmol/L (ref 135–145)
Total Bilirubin: 0.7 mg/dL (ref 0.3–1.2)
Total Protein: 8.3 g/dL — ABNORMAL HIGH (ref 6.5–8.1)

## 2018-05-14 LAB — CBC WITH DIFFERENTIAL/PLATELET
Abs Immature Granulocytes: 0.04 10*3/uL (ref 0.00–0.07)
Basophils Absolute: 0.1 10*3/uL (ref 0.0–0.1)
Basophils Relative: 1 %
Eosinophils Absolute: 0.2 10*3/uL (ref 0.0–0.5)
Eosinophils Relative: 2 %
HCT: 45.2 % (ref 36.0–46.0)
Hemoglobin: 15 g/dL (ref 12.0–15.0)
Immature Granulocytes: 0 %
Lymphocytes Relative: 24 %
Lymphs Abs: 2.3 10*3/uL (ref 0.7–4.0)
MCH: 32.6 pg (ref 26.0–34.0)
MCHC: 33.2 g/dL (ref 30.0–36.0)
MCV: 98.3 fL (ref 80.0–100.0)
Monocytes Absolute: 0.9 10*3/uL (ref 0.1–1.0)
Monocytes Relative: 9 %
Neutro Abs: 6.2 10*3/uL (ref 1.7–7.7)
Neutrophils Relative %: 64 %
Platelets: 357 10*3/uL (ref 150–400)
RBC: 4.6 MIL/uL (ref 3.87–5.11)
RDW: 12.8 % (ref 11.5–15.5)
WBC: 9.6 10*3/uL (ref 4.0–10.5)
nRBC: 0 % (ref 0.0–0.2)

## 2018-05-14 LAB — URINALYSIS, ROUTINE W REFLEX MICROSCOPIC
Bilirubin Urine: NEGATIVE
Glucose, UA: >=500 mg/dL — AB
Ketones, ur: 80 mg/dL — AB
Nitrite: POSITIVE — AB
Protein, ur: NEGATIVE mg/dL
Specific Gravity, Urine: 1.026 (ref 1.005–1.030)
pH: 5 (ref 5.0–8.0)

## 2018-05-14 LAB — LACTIC ACID, PLASMA: Lactic Acid, Venous: 2.2 mmol/L (ref 0.5–1.9)

## 2018-05-14 MED ORDER — IOHEXOL 300 MG/ML  SOLN
100.0000 mL | Freq: Once | INTRAMUSCULAR | Status: AC | PRN
  Administered 2018-05-14: 100 mL via INTRAVENOUS

## 2018-05-14 MED ORDER — CEPHALEXIN 500 MG PO CAPS: 500.0000 mg | ORAL_CAPSULE | Freq: Two times a day (BID) | ORAL | 0 refills | Status: DC

## 2018-05-14 MED ORDER — POLYETHYLENE GLYCOL 3350 17 GM/SCOOP PO POWD: ORAL | 0 refills | Status: DC

## 2018-05-14 MED ORDER — SODIUM CHLORIDE 0.9 % IV SOLN
1.0000 g | Freq: Once | INTRAVENOUS | Status: AC
  Administered 2018-05-14: 1 g via INTRAVENOUS
  Filled 2018-05-14: qty 10

## 2018-05-14 MED ORDER — SODIUM CHLORIDE 0.9 % IV BOLUS
500.0000 mL | Freq: Once | INTRAVENOUS | Status: AC
  Administered 2018-05-14: 500 mL via INTRAVENOUS

## 2018-05-14 NOTE — ED Provider Notes (Addendum)
[facility] EMERGENCY DEPARTMENT Provider Note   CSN: [id number] Arrival date & time: 05/14/18  1719    History   Chief Complaint Chief Complaint  Patient presents with   Abdominal Pain    HPI Helen Richardson is a 85 y.o. female.     85yo F w/ PMH including IDDM, HTN, HLD who p/w abdominal pain. Patient is poor historian therefore history difficult. Pt states she began having lower abdominal pain either yesterday or today. It has been intermittent.  The right side has been hurting worse than the left.  She has had some nausea but no vomiting or diarrhea.  She normally has a bowel movement every morning but has not had a bowel movement this morning.  Denies any urinary symptoms, fevers, or URI symptoms.  No chest pain or breathing problems.  She has not taken her medications including insulin today because she did not feel well.  She was noted to be hyperglycemic by EMS in the 300s.  The history is provided by the patient.  Abdominal Pain    Past Medical History:  Diagnosis Date   Diabetes mellitus without complication (HCC)    Obesity     Patient Active Problem List   Diagnosis Date Noted   Type 2 diabetes mellitus with stage 3 chronic kidney disease, with long-term current use of insulin (HCC) 03/08/2018   Parenchymal renal hypertension 03/08/2018   Pure hypercholesterolemia 03/08/2018   Class 3 severe obesity due to excess calories with serious comorbidity and body mass index (BMI) of 40.0 to 44.9 in adult [facility]) 03/08/2018    History reviewed. No pertinent surgical history.   OB History   No obstetric history on file.      Home Medications    Prior to Admission medications   Medication Sig Start Date End Date Taking? Authorizing Provider  aspirin 81 MG chewable tablet Chew 81 mg by mouth at bedtime.   Yes [provider]  Calcium Carbonate-Vitamin D (CALTRATE 600+D PO) Take 1 tablet by mouth daily.   Yes [provider]   carvedilol (COREG) 6.25 MG tablet TAKE 1 TABLET BY MOUTH TWICE DAILY Patient taking differently: Take 6.25 mg by mouth 2 (two) times daily.  02/18/18  Yes Dorothyann Peng, MD  Cholecalciferol (VITAMIN D3 PO) Take 25 mcg by mouth daily.   Yes [provider]  furosemide (LASIX) 40 MG tablet Take 40 mg by mouth daily.  02/18/13  Yes [provider]  HUMALOG MIX 75/25 KWIKPEN (75-25) 100 UNIT/ML Kwikpen ADMINISTER 20 UNITS UNDER THE SKIN BEFORE BREAKFAST AND DINNER. DO NOT EXCEED. MAX DOSE OF 50 UNITS A DAY Patient taking differently: Inject 20 Units into the skin 2 (two) times daily. Before breakfast and dinner 04/15/18  Yes Dorothyann Peng, MD  potassium chloride SA (K-DUR,KLOR-CON) 20 MEQ tablet TAKE 1 TABLET BY MOUTH TWICE A DAY Patient taking differently: Take 20 mEq by mouth 2 (two) times daily.  01/19/18  Yes Dorothyann Peng, MD  rosuvastatin (CRESTOR) 20 MG tablet TAKE 1 TABLET BY MOUTH EVERY DAY Patient taking differently: Take 20 mg by mouth daily.  02/22/18  Yes Dorothyann Peng, MD  telmisartan (MICARDIS) 80 MG tablet Take 80 mg by mouth daily. 10/23/17  Yes [provider]  TRADJENTA 5 MG TABS tablet TAKE 1 TABLET BY MOUTH ONCE DAILY Patient taking differently: Take 5 mg by mouth daily.  03/24/18  Yes Dorothyann Peng, MD  ADVOCATE LANCETS MISC  01/04/13   [provider]  Alcohol Swabs (ALCOHOL PREP) 70 % PADS  02/09/13   [provider]  cephALEXin (KEFLEX) 500 MG capsule Take 1 capsule (500 mg total) by mouth 2 (two) times daily. 05/14/18   Kawana Hegel, Ambrose Finland, MD  COMFORT EZ PEN NEEDLES 32G X 4 MM MISC  08/21/17   [provider]  Lancet Devices (ADVOCATE LANCING DEVICE) MISC  02/09/13   [provider]  polyethylene glycol powder (GLYCOLAX/MIRALAX) powder Mix 1 capful in drink and take by mouth 1 to 3 times daily as needed for daily soft stools  OTC 05/14/18   Milford Cilento, Ambrose Finland, MD    Family History Family History   Problem Relation Age of Onset   Diabetes Mother    Hypertension Mother     Social History Social History   Tobacco Use   Smoking status: Never Smoker   Smokeless tobacco: Never Used  Substance Use Topics   Alcohol use: No   Drug use: No     Allergies   Patient has no known allergies.   Review of Systems Review of Systems  Gastrointestinal: Positive for abdominal pain.   All other systems reviewed and are negative except that which was mentioned in HPI   Physical Exam Updated Vital Signs BP (!) 111/95 (BP Location: Right Arm)    Pulse (!) 115    Temp 98.7 F (37.1 C) (Oral)    Resp 20    Ht 5\' 3"  (1.6 m)    Wt 106 kg    SpO2 97%    BMI 41.40 kg/m   Physical Exam Vitals signs and nursing note reviewed.  Constitutional:      General: She is not in acute distress.    Appearance: She is well-developed.     Comments: Anxious, uncomfortable  HENT:     Head: Normocephalic and atraumatic.  Eyes:     Conjunctiva/sclera: Conjunctivae normal.  Neck:     Musculoskeletal: Neck supple.  Cardiovascular:     Rate and Rhythm: Normal rate and regular rhythm.     Heart sounds: Normal heart sounds. No murmur.  Pulmonary:     Effort: Pulmonary effort is normal.     Breath sounds: Normal breath sounds.  Abdominal:     General: Bowel sounds are normal. There is no distension.     Palpations: Abdomen is soft.     Tenderness: There is abdominal tenderness in the right lower quadrant and left lower quadrant. There is no guarding or rebound.  Musculoskeletal:     Comments: Mild BLE edema  Skin:    General: Skin is warm and dry.  Neurological:     Mental Status: She is alert and oriented to person, place, and time.     Comments: Fluent speech  Psychiatric:        Mood and Affect: Mood is anxious.        Judgment: Judgment normal.      ED Treatments / Results  Labs (all labs ordered are listed, but only abnormal results are displayed) Labs Reviewed  URINALYSIS,  ROUTINE W REFLEX MICROSCOPIC - Abnormal; Notable for the following components:      Result Value   APPearance HAZY (*)    Glucose, UA >=500 (*)    Hgb urine dipstick SMALL (*)    Ketones, ur 80 (*)    Nitrite POSITIVE (*)    Leukocytes,Ua TRACE (*)    Bacteria, UA MANY (*)    All other components within normal limits  COMPREHENSIVE METABOLIC PANEL -  Abnormal; Notable for the following components:   Glucose, Bld 157 (*)    Creatinine, Ser 1.13 (*)    Total Protein 8.3 (*)    GFR calc non Af Amer 45 (*)    GFR calc Af Amer 52 (*)    All other components within normal limits  LACTIC ACID, PLASMA - Abnormal; Notable for the following components:   Lactic Acid, Venous 2.2 (*)    All other components within normal limits  URINE CULTURE  CBC WITH DIFFERENTIAL/PLATELET  LIPASE, BLOOD  LACTIC ACID, PLASMA    EKG None  Radiology Ct Abdomen Pelvis W Contrast  Result Date: 05/14/2018 CLINICAL DATA:  Lower quadrant abdominal pain, right greater than left since last evening. EXAM: CT ABDOMEN AND PELVIS WITH CONTRAST TECHNIQUE: Multidetector CT imaging of the abdomen and pelvis was performed using the standard protocol following bolus administration of intravenous contrast. CONTRAST:  OMNIPAQUE IOHEXOL 300 MG/ML  SOLN COMPARISON:  None. FINDINGS: Lower chest: The included heart size is normal.  Clear lung bases. Hepatobiliary: Limited by motion artifacts. No gallstones or biliary dilatation. No enhancing liver lesion. Subtle hypodensity in the posterior right hepatic lobe too small to further characterize measuring 6 mm is identified statistically consistent with a cyst, biliary hamartoma or tiny hemangioma. Pancreas: No mass or definite inflammation though limited by motion artifacts. Spleen: No splenomegaly or mass. Adrenals/Urinary Tract: Adrenal glands are unremarkable. There appears to be a nonobstructing lower pole left renal calculus measuring up to 4 mm. No hydroureteronephrosis. No  enhancing renal mass. Bladder is unremarkable. Stomach/Bowel: Stomach is within normal limits. Appendix appears normal. No evidence of bowel wall thickening, distention, or inflammatory changes. Moderate stool retention within the rectosigmoid. Vascular/Lymphatic: Aortic atherosclerosis. No enlarged abdominal or pelvic lymph nodes. Reproductive: Status post hysterectomy. No adnexal masses. Other: Small periumbilical fat containing hernia. No free air or free fluid. Musculoskeletal: Degenerative disc disease T12 through L5. IMPRESSION: 1. Moderate stool retention within the rectosigmoid query constipation. 2. No acute bowel obstruction or inflammation. 3. 4 mm calcification is suggested in the left kidney without obstruction. 4. Tiny too small to characterize cyst or hemangioma in the right hepatic lobe. 5. Thoracolumbar spondylosis. Electronically Signed   By: Tollie Eth M.D.   On: 05/14/2018 20:22    Procedures Procedures (including critical care time)  Medications Ordered in ED Medications  iohexol (OMNIPAQUE) 300 MG/ML solution 100 mL (100 mLs Intravenous Contrast Given 05/14/18 1958)  sodium chloride 0.9 % bolus 500 mL (500 mLs Intravenous New Bag/Given 05/14/18 2105)  cefTRIAXone (ROCEPHIN) 1 g in sodium chloride 0.9 % 100 mL IVPB (1 g Intravenous New Bag/Given 05/14/18 2108)     Initial Impression / Assessment and Plan / ED Course  I have reviewed the triage vital signs and the nursing notes.  Pertinent labs & imaging results that were available during my care of the patient were reviewed by me and considered in my medical decision making (see chart for details).     Anxious and uncomfortable on initial exam but nontoxic, afebrile with stable vital signs.  She had tenderness in right lower and left lower quadrants.  Differential includes diverticulitis, UTI, bowel obstruction.  Lab work shows unremarkable CMP, lipase, and CBC.  UA with trace leukocytes, small amount of blood.  No obvious  signs of infection.  Obtain CT of abdomen and pelvis.  Lab work shows very mildly elevated lactate at 2.2, creatinine 1.13, otherwise reassuring CMP, normal CBC, normal lipase.  UA is nitrite positive  with WBCs, RBCs and bacteria consistent with infection.  Urine culture sent.  CT scan shows constipation but no other acute findings.  Recommended treatment for UTI given suprapubic pain.  Gave dose of ceftriaxone and course of Keflex for home.  Discussed treatment of constipation with MiraLAX with titration to effect.  Recommended follow-up with PCP in 2-3 days for recheck.  Extensively reviewed return precautions with patient and family.  She voiced understanding.  Patient eating and drinking prior to discharge.  Of note, patient was tachycardic at time of discharge.  She had been tachycardic at presentation as well.  She has been hydrated and remains afebrile, symptoms adequately controlled.  She has missed her medications all day today and potentially yesterday as well.  Therefore she has missed at least 2 doses of Coreg and I suspect this may be contributing to her tachycardia.  I have emphasized the importance of compliance with medication as soon as she gets home. Final Clinical Impressions(s) / ED Diagnoses   Final diagnoses:  Acute cystitis without hematuria  Constipation, unspecified constipation type    ED Discharge Orders         Ordered    cephALEXin (KEFLEX) 500 MG capsule  2 times daily     05/14/18 2225    polyethylene glycol powder (GLYCOLAX/MIRALAX) powder     05/14/18 2225           Javarious Elsayed, Ambrose Finland, MD 05/14/18 2230    Rayyan Orsborn, Ambrose Finland, MD 05/15/18 0040

## 2018-05-14 NOTE — ED Triage Notes (Signed)
Pt BIB [facility] for eval of blt LQ abd pain R>L.Pt reports onset last evening, worse today. Denies N/V/D, denies fever. Pt reports frequency, denies dysuria. Pt received 100cc NS by EMS. #20G PIV to L FA.

## 2018-05-16 ENCOUNTER — Other Ambulatory Visit: Payer: Self-pay | Admitting: Internal Medicine

## 2018-05-17 LAB — URINE CULTURE: Culture: >=100000 — AB

## 2018-05-18 ENCOUNTER — Ambulatory Visit: Payer: Medicare Other | Admitting: Nurse Practitioner

## 2018-05-18 ENCOUNTER — Encounter: Payer: Self-pay | Admitting: Nurse Practitioner

## 2018-05-18 ENCOUNTER — Telehealth: Payer: Self-pay

## 2018-05-18 ENCOUNTER — Ambulatory Visit
Admission: RE | Admit: 2018-05-18 | Discharge: 2018-05-18 | Disposition: A | Discharge status: Home / Self Care | Payer: Medicare Other | Source: Ambulatory Visit | Attending: Nurse Practitioner | Admitting: Nurse Practitioner

## 2018-05-18 ENCOUNTER — Other Ambulatory Visit: Payer: Self-pay

## 2018-05-18 VITALS — BP 114/70 | HR 80 | Temp 97.5°F | Ht 63.0 in

## 2018-05-18 DIAGNOSIS — N3000 Acute cystitis without hematuria: Secondary | ICD-10-CM | POA: Diagnosis not present

## 2018-05-18 DIAGNOSIS — M25511 Pain in right shoulder: Secondary | ICD-10-CM

## 2018-05-18 DIAGNOSIS — E1122 Type 2 diabetes mellitus with diabetic chronic kidney disease: Secondary | ICD-10-CM | POA: Diagnosis not present

## 2018-05-18 DIAGNOSIS — N183 Chronic kidney disease, stage 3 unspecified: Secondary | ICD-10-CM

## 2018-05-18 DIAGNOSIS — R7989 Other specified abnormal findings of blood chemistry: Secondary | ICD-10-CM

## 2018-05-18 DIAGNOSIS — Z794 Long term (current) use of insulin: Secondary | ICD-10-CM

## 2018-05-18 MED ORDER — KETOROLAC TROMETHAMINE 60 MG/2ML IM SOLN
60.0000 mg | Freq: Once | INTRAMUSCULAR | Status: AC
  Administered 2018-05-18: 60 mg via INTRAMUSCULAR

## 2018-05-18 NOTE — Telephone Encounter (Signed)
Post ED Visit - Positive Culture Follow-up  Culture report reviewed by antimicrobial stewardship pharmacist: [facility] []  Enzo Bi, Pharm.D. []  Celedonio Miyamoto, Pharm.D., BCPS AQ-ID []  Garvin Fila, Pharm.D., BCPS []  Georgina Pillion, Pharm.D., BCPS []  [place], [place].D., BCPS, AAHIVP []  Estella Husk, Pharm.D., BCPS, AAHIVP [x]  Lysle Pearl, PharmD, BCPS []  Phillips Climes, PharmD, BCPS []  Agapito Games, PharmD, BCPS []  Verlan Friends, PharmD []  Mervyn Gay, PharmD, BCPS []  Vinnie Level, PharmD  [facility] []  Len Childs, PharmD []  Greer Pickerel, PharmD []  Adalberto Cole, PharmD []  Perlie Gold, Rph []  Lonell Face) Jean Rosenthal, PharmD []  Earl Many, PharmD []  Junita Push, PharmD []  Dorna Leitz, PharmD []  Terrilee Files, PharmD []  Lynann Beaver, PharmD []  Keturah Barre, PharmD []  Loralee Pacas, PharmD []  Bernadene Person, PharmD   Positive urine culture Treated with Cephalexin, organism sensitive to the same and no further patient follow-up is required at this time.  Jerry Caras 05/18/2018, 8:33 AM

## 2018-05-18 NOTE — Progress Notes (Signed)
Subjective:     Patient ID: Helen Richardson , female    DOB: 1934-03-03 , 85 y.o.   MRN: [id number]   Chief Complaint  Patient presents with  . Follow-up    HPI  She has been hurting all over and was having difficulty getting out the of bed.  Has not taken any medications.    Urinary Tract Infection   This is a new problem. The current episode started in the past 7 days. She is not sexually active. There is no history of pyelonephritis. Pertinent negatives include no frequency or nausea.     Past Medical History:  Diagnosis Date  . Diabetes mellitus without complication (HCC)   . Obesity      Family History  Problem Relation Age of Onset  . Diabetes Mother   . Hypertension Mother   . Hypertension Father   . Stroke Father      Current Outpatient Medications:  .  ADVOCATE LANCETS MISC, , Disp: , Rfl:  .  Alcohol Swabs (ALCOHOL PREP) 70 % PADS, , Disp: , Rfl:  .  aspirin 81 MG chewable tablet, Chew 81 mg by mouth at bedtime., Disp: , Rfl:  .  Calcium Carbonate-Vitamin D (CALTRATE 600+D PO), Take 1 tablet by mouth daily., Disp: , Rfl:  .  carvedilol (COREG) 6.25 MG tablet, TAKE 1 TABLET BY MOUTH TWICE DAILY, Disp: 60 tablet, Rfl: 4 .  cephALEXin (KEFLEX) 500 MG capsule, Take 1 capsule (500 mg total) by mouth 2 (two) times daily., Disp: 14 capsule, Rfl: 0 .  Cholecalciferol (VITAMIN D3 PO), Take 25 mcg by mouth daily. 1 per day, Disp: , Rfl:  .  COMFORT EZ PEN NEEDLES 32G X 4 MM MISC, , Disp: , Rfl: 5 .  furosemide (LASIX) 40 MG tablet, Take 40 mg by mouth daily. , Disp: , Rfl:  .  HUMALOG MIX 75/25 KWIKPEN (75-25) 100 UNIT/ML Kwikpen, ADMINISTER 20 UNITS UNDER THE SKIN BEFORE BREAKFAST AND DINNER. DO NOT EXCEED. MAX DOSE OF 50 UNITS A DAY (Patient taking differently: Inject 20 Units into the skin 2 (two) times daily. Before breakfast and dinner), Disp: 15 mL, Rfl: 0 .  Lancet Devices (ADVOCATE LANCING DEVICE) MISC, , Disp: , Rfl:  .  polyethylene glycol powder  (GLYCOLAX/MIRALAX) powder, Mix 1 capful in drink and take by mouth 1 to 3 times daily as needed for daily soft stools  OTC, Disp: 225 g, Rfl: 0 .  potassium chloride SA (K-DUR,KLOR-CON) 20 MEQ tablet, TAKE 1 TABLET BY MOUTH TWICE A DAY (Patient taking differently: Take 20 mEq by mouth 2 (two) times daily. ), Disp: 60 tablet, Rfl: 5 .  rosuvastatin (CRESTOR) 20 MG tablet, TAKE 1 TABLET BY MOUTH EVERY DAY, Disp: 90 tablet, Rfl: 0 .  telmisartan (MICARDIS) 80 MG tablet, TAKE 1 TABLET BY MOUTH ONCE DAILY, Disp: 30 tablet, Rfl: 4 .  TRADJENTA 5 MG TABS tablet, TAKE 1 TABLET BY MOUTH ONCE DAILY (Patient taking differently: Take 5 mg by mouth daily. ), Disp: 30 tablet, Rfl: 3   No Known Allergies   Review of Systems  Constitutional: Negative for fatigue.  Gastrointestinal: Negative for nausea.  Endocrine: Negative for polydipsia, polyphagia and polyuria.  Genitourinary: Negative for frequency.     Today's Vitals   05/18/18 1224  BP: 114/70  Pulse: 80  Temp: (!) 97.5 F (36.4 C)  TempSrc: Oral  Height: 5\' 3"  (1.6 m)  PainSc: 10-Worst pain ever  PainLoc: Abdomen   Body mass index is  41.4 kg/m.   Objective:  Physical Exam Constitutional:      Appearance: Normal appearance.  Cardiovascular:     Rate and Rhythm: Normal rate and regular rhythm.  Musculoskeletal: Normal range of motion.  Neurological:     Mental Status: She is alert.         Assessment And Plan:     1. Type 2 diabetes mellitus with stage 3 chronic kidney disease, with long-term current use of insulin (HCC) Chronic, poorly controlled Blood sugar has been elevated over the last few days at 302 this morning. Continue with current medications Encouraged to limit intake of sugary foods and drinks - Ambulatory referral to Endocrinology  2. Acute pain of right shoulder  Pain to right shoulder with stiffness noted with flexion and extension  Frozen shoulder vs muscle tension - DG Shoulder Right; Future - ketorolac  (TORADOL) injection 60 mg  3. Acute cystitis without hematuria  She is being treated with an antibiotic  4. Elevated lactic acid level  Will recheck today was 2.2 - Lactic acid, plasma   Arnette Felts, FNP

## 2018-05-20 ENCOUNTER — Other Ambulatory Visit: Payer: Self-pay | Admitting: Nurse Practitioner

## 2018-05-20 DIAGNOSIS — M25511 Pain in right shoulder: Secondary | ICD-10-CM

## 2018-05-21 ENCOUNTER — Other Ambulatory Visit: Payer: Self-pay

## 2018-05-21 ENCOUNTER — Ambulatory Visit (INDEPENDENT_AMBULATORY_CARE_PROVIDER_SITE_OTHER): Payer: Medicare Other | Admitting: Internal Medicine

## 2018-05-21 ENCOUNTER — Other Ambulatory Visit: Payer: Self-pay | Admitting: Internal Medicine

## 2018-05-21 ENCOUNTER — Encounter: Payer: Self-pay | Admitting: Internal Medicine

## 2018-05-21 ENCOUNTER — Telehealth: Payer: Self-pay

## 2018-05-21 VITALS — BP 124/86 | HR 80 | Temp 98.5°F | Ht 63.0 in

## 2018-05-21 DIAGNOSIS — R0609 Other forms of dyspnea: Secondary | ICD-10-CM | POA: Diagnosis not present

## 2018-05-21 DIAGNOSIS — R0600 Dyspnea, unspecified: Secondary | ICD-10-CM

## 2018-05-21 DIAGNOSIS — M25511 Pain in right shoulder: Secondary | ICD-10-CM | POA: Diagnosis not present

## 2018-05-21 DIAGNOSIS — R600 Localized edema: Secondary | ICD-10-CM | POA: Diagnosis not present

## 2018-05-21 DIAGNOSIS — W19XXXA Unspecified fall, initial encounter: Secondary | ICD-10-CM

## 2018-05-21 NOTE — Telephone Encounter (Signed)
-----   Message from Arnette Felts, FNP sent at 05/20/2018 12:09 PM EDT ----- Right shoulder xray reveals calcification and mild to moderate degenerative changes, due to the level of pain and the results I recommend an orthopedic referral, I will place the order.

## 2018-05-21 NOTE — Telephone Encounter (Signed)
Left message 1st attempt to give xray results

## 2018-05-21 NOTE — Patient Instructions (Addendum)
CONTINUE TO TAKE FUROSEMIDE 40MG  DAILY  ON Saturday, MARCH 21ST, TAKE AN ADDITIONAL FUROSEMIDE AT 4PM.   ASHLEY WILL CALL ME Monday TO LET ME KNOW HOW YOU ARE DOING.    Edema  Edema is when you have too much fluid in your body or under your skin. Edema may make your legs, feet, and ankles swell up. Swelling is also common in looser tissues, like around your eyes. This is a common condition. It gets more common as you get older. There are many possible causes of edema. Eating too much salt (sodium) and being on your feet or sitting for a long time can cause edema in your legs, feet, and ankles. Hot weather may make edema worse. Edema is usually painless. Your skin may look swollen or shiny. Follow these instructions at home:  Keep the swollen body part raised (elevated) above the level of your heart when you are sitting or lying down.  Do not sit still or stand for a long time.  Do not wear tight clothes. Do not wear garters on your upper legs.  Exercise your legs. This can help the swelling go down.  Wear elastic bandages or support stockings as told by your doctor.  Eat a low-salt (low-sodium) diet to reduce fluid as told by your doctor.  Depending on the cause of your swelling, you may need to limit how much fluid you drink (fluid restriction).  Take over-the-counter and prescription medicines only as told by your doctor. Contact a doctor if:  Treatment is not working.  You have heart, liver, or kidney disease and have symptoms of edema.  You have sudden and unexplained weight gain. Get help right away if:  You have shortness of breath or chest pain.  You cannot breathe when you lie down.  You have pain, redness, or warmth in the swollen areas.  You have heart, liver, or kidney disease and get edema all of a sudden.  You have a fever and your symptoms get worse all of a sudden. Summary  Edema is when you have too much fluid in your body or under your skin.  Edema  may make your legs, feet, and ankles swell up. Swelling is also common in looser tissues, like around your eyes.  Raise (elevate) the swollen body part above the level of your heart when you are sitting or lying down.  Follow your doctor's instructions about diet and how much fluid you can drink (fluid restriction). This information is not intended to replace advice given to you by your health care provider. Make sure you discuss any questions you have with your health care provider. Document Released: 08/06/2007 Document Revised: 03/07/2016 Document Reviewed: 03/07/2016 Elsevier Interactive Patient Education  2019 Elsevier Inc.   Low-Sodium Eating Plan Sodium, which is an element that makes up salt, helps you maintain a healthy balance of fluids in your body. Too much sodium can increase your blood pressure and cause fluid and waste to be held in your body. Your health care provider or dietitian may recommend following this plan if you have high blood pressure (hypertension), kidney disease, liver disease, or heart failure. Eating less sodium can help lower your blood pressure, reduce swelling, and protect your heart, liver, and kidneys. What are tips for following this plan? General guidelines  Most people on this plan should limit their sodium intake to 1,500-2,000 mg (milligrams) of sodium each day. Reading food labels   The Nutrition Facts label lists the amount of sodium in one  serving of the food. If you eat more than one serving, you must multiply the listed amount of sodium by the number of servings.  Choose foods with less than 140 mg of sodium per serving.  Avoid foods with 300 mg of sodium or more per serving. Shopping  Look for lower-sodium products, often labeled as "low-sodium" or "no salt added."  Always check the sodium content even if foods are labeled as "unsalted" or "no salt added".  Buy fresh foods. ? Avoid canned foods and premade or frozen meals. ? Avoid  canned, cured, or processed meats  Buy breads that have less than 80 mg of sodium per slice. Cooking  Eat more home-cooked food and less restaurant, buffet, and fast food.  Avoid adding salt when cooking. Use salt-free seasonings or herbs instead of table salt or sea salt. Check with your health care provider or pharmacist before using salt substitutes.  Cook with plant-based oils, such as canola, sunflower, or olive oil. Meal planning  When eating at a restaurant, ask that your food be prepared with less salt or no salt, if possible.  Avoid foods that contain MSG (monosodium glutamate). MSG is sometimes added to [place] food, bouillon, and some canned foods. What foods are recommended? The items listed may not be a complete list. Talk with your dietitian about what dietary choices are best for you. Grains Low-sodium cereals, including oats, puffed wheat and rice, and shredded wheat. Low-sodium crackers. Unsalted rice. Unsalted pasta. Low-sodium bread. Whole-grain breads and whole-grain pasta. Vegetables Fresh or frozen vegetables. "No salt added" canned vegetables. "No salt added" tomato sauce and paste. Low-sodium or reduced-sodium tomato and vegetable juice. Fruits Fresh, frozen, or canned fruit. Fruit juice. Meats and other protein foods Fresh or frozen (no salt added) meat, poultry, seafood, and fish. Low-sodium canned tuna and salmon. Unsalted nuts. Dried peas, beans, and lentils without added salt. Unsalted canned beans. Eggs. Unsalted nut butters. Dairy Milk. Soy milk. Cheese that is naturally low in sodium, such as ricotta cheese, fresh mozzarella, or Swiss cheese Low-sodium or reduced-sodium cheese. Cream cheese. Yogurt. Fats and oils Unsalted butter. Unsalted margarine with no trans fat. Vegetable oils such as canola or olive oils. Seasonings and other foods Fresh and dried herbs and spices. Salt-free seasonings. Low-sodium mustard and ketchup. Sodium-free salad dressing.  Sodium-free light mayonnaise. Fresh or refrigerated horseradish. Lemon juice. Vinegar. Homemade, reduced-sodium, or low-sodium soups. Unsalted popcorn and pretzels. Low-salt or salt-free chips. What foods are not recommended? The items listed may not be a complete list. Talk with your dietitian about what dietary choices are best for you. Grains Instant hot cereals. Bread stuffing, pancake, and biscuit mixes. Croutons. Seasoned rice or pasta mixes. Noodle soup cups. Boxed or frozen macaroni and cheese. Regular salted crackers. Self-rising flour. Vegetables Sauerkraut, pickled vegetables, and relishes. Olives. [place] fries. Onion rings. Regular canned vegetables (not low-sodium or reduced-sodium). Regular canned tomato sauce and paste (not low-sodium or reduced-sodium). Regular tomato and vegetable juice (not low-sodium or reduced-sodium). Frozen vegetables in sauces. Meats and other protein foods Meat or fish that is salted, canned, smoked, spiced, or pickled. Bacon, ham, sausage, hotdogs, corned beef, chipped beef, packaged lunch meats, salt pork, jerky, pickled herring, anchovies, regular canned tuna, sardines, salted nuts. Dairy Processed cheese and cheese spreads. Cheese curds. Blue cheese. Feta cheese. String cheese. Regular cottage cheese. Buttermilk. Canned milk. Fats and oils Salted butter. Regular margarine. Ghee. Bacon fat. Seasonings and other foods Onion salt, garlic salt, seasoned salt, table salt, and sea salt.  Canned and packaged gravies. Worcestershire sauce. Tartar sauce. Barbecue sauce. Teriyaki sauce. Soy sauce, including reduced-sodium. Steak sauce. Fish sauce. Oyster sauce. Cocktail sauce. Horseradish that you find on the shelf. Regular ketchup and mustard. Meat flavorings and tenderizers. Bouillon cubes. Hot sauce and Tabasco sauce. Premade or packaged marinades. Premade or packaged taco seasonings. Relishes. Regular salad dressings. Salsa. Potato and tortilla chips. Corn chips  and puffs. Salted popcorn and pretzels. Canned or dried soups. Pizza. Frozen entrees and pot pies. Summary  Eating less sodium can help lower your blood pressure, reduce swelling, and protect your heart, liver, and kidneys.  Most people on this plan should limit their sodium intake to 1,500-2,000 mg (milligrams) of sodium each day.  Canned, boxed, and frozen foods are high in sodium. Restaurant foods, fast foods, and pizza are also very high in sodium. You also get sodium by adding salt to food.  Try to cook at home, eat more fresh fruits and vegetables, and eat less fast food, canned, processed, or prepared foods. This information is not intended to replace advice given to you by your health care provider. Make sure you discuss any questions you have with your health care provider. Document Released: 08/09/2001 Document Revised: 02/11/2016 Document Reviewed: 02/11/2016 Elsevier Interactive Patient Education  2019 [employer].

## 2018-05-22 LAB — PRO B NATRIURETIC PEPTIDE: NT-Pro BNP: 392 pg/mL (ref 0–738)

## 2018-05-25 ENCOUNTER — Ambulatory Visit: Payer: Self-pay

## 2018-05-25 DIAGNOSIS — R0609 Other forms of dyspnea: Secondary | ICD-10-CM

## 2018-05-25 DIAGNOSIS — N183 Chronic kidney disease, stage 3 unspecified: Secondary | ICD-10-CM

## 2018-05-25 DIAGNOSIS — E1122 Type 2 diabetes mellitus with diabetic chronic kidney disease: Secondary | ICD-10-CM

## 2018-05-25 DIAGNOSIS — Z794 Long term (current) use of insulin: Secondary | ICD-10-CM

## 2018-05-25 DIAGNOSIS — R0600 Dyspnea, unspecified: Secondary | ICD-10-CM

## 2018-05-25 DIAGNOSIS — R600 Localized edema: Secondary | ICD-10-CM

## 2018-05-25 DIAGNOSIS — W19XXXA Unspecified fall, initial encounter: Secondary | ICD-10-CM

## 2018-05-25 NOTE — Chronic Care Management (AMB) (Signed)
  Care Management Note   Helen Richardson is a 85 y.o. year old female who is a primary care patient of Glendale Chard, MD . The CM team was consulted for assistance with chronic disease management and care coordination of resource needs.  Review of patient status, including review of consultants reports, and collaboration with appropriate care team members and the patient's provider was performed as part of comprehensive patient evaluation and provision of chronic care management services. Telephone outreach to patient today to introduce CCM services.   I reached out to [employer] by phone today.   [name] was given information about Chronic Care Management services today including:  1. CCM service includes personalized support from designated clinical staff supervised by her physician, including individualized plan of care and coordination with other care providers 2. 24/7 contact phone numbers for assistance for urgent and routine care needs. 3. Service will only be billed when office clinical staff spend 20 minutes or more in a month to coordinate care. 4. Only one practitioner may furnish and bill the service in a calendar month. 5. The patient may stop CCM services at any time (effective at the end of the month) by phone call to the office staff. 6. The patient will be responsible for cost sharing (co-pay) of up to 20% of the service fee (after annual deductible is met).   Patient did not agree to services and wishes to consider information provided before deciding about enrollment in CCM services.    I reached out to the patient today by phone to introduce CCM program. The patient had difficulty understanding what the program was and how it may benefit her. The patient reports she currently has a nurse to assist daily with bathing and meal preparation. The patient is unsure which agency this nurse is with stating "my son set it up". The patient acknowledges her decline in health by  stating "I haven't been able to do much the last few months". The patient is agreeable to a follow up call by CCM RN CM Glenard Haring Little to discuss enrollment into the CCM program.   Follow Up Plan: SW will in-basket CCM RN CM requesting follow up call to the patient regarding program enrollment.   Daneen Schick, BSW, CDP [facility] Care Management Social Worker [phone]  Total time spent performing care coordination and/or care management activities with the patient by phone or face to face = 15 minutes.

## 2018-05-25 NOTE — Patient Instructions (Signed)
Social Worker Visit Information   Materials provided: Verbal education about Chronic Care Management Program provided by phone  Ms. Fulfer was given information about Chronic Care Management services today including:  1. CCM service includes personalized support from designated clinical staff supervised by her physician, including individualized plan of care and coordination with other care providers 2. 24/7 contact phone numbers for assistance for urgent and routine care needs. 3. Service will only be billed when office clinical staff spend 20 minutes or more in a month to coordinate care. 4. Only one practitioner may furnish and bill the service in a calendar month. 5. The patient may stop CCM services at any time (effective at the end of the month) by phone call to the office staff. 6. The patient will be responsible for cost sharing (co-pay) of up to 20% of the service fee (after annual deductible is met).  Patient did not agree to services and wishes to consider information provided before deciding about enrollment in CCM services.   The patient verbalized understanding of instructions provided today and declined a print copy of patient instruction materials.   Follow up plan: SW will have CCM RN CM outreach the patient to further discuss program   Daneen Schick, [place], CDP [facility] [place] Management Social Worker [phone]

## 2018-05-28 ENCOUNTER — Ambulatory Visit (INDEPENDENT_AMBULATORY_CARE_PROVIDER_SITE_OTHER): Payer: Medicare Other

## 2018-05-28 DIAGNOSIS — W19XXXA Unspecified fall, initial encounter: Secondary | ICD-10-CM

## 2018-05-28 DIAGNOSIS — N183 Chronic kidney disease, stage 3 unspecified: Secondary | ICD-10-CM

## 2018-05-28 DIAGNOSIS — E1122 Type 2 diabetes mellitus with diabetic chronic kidney disease: Secondary | ICD-10-CM | POA: Diagnosis not present

## 2018-05-28 DIAGNOSIS — Z794 Long term (current) use of insulin: Secondary | ICD-10-CM

## 2018-05-31 ENCOUNTER — Other Ambulatory Visit: Payer: Self-pay

## 2018-05-31 ENCOUNTER — Ambulatory Visit: Payer: Self-pay

## 2018-05-31 ENCOUNTER — Telehealth: Payer: Medicare Other

## 2018-05-31 ENCOUNTER — Other Ambulatory Visit: Payer: Self-pay | Admitting: Internal Medicine

## 2018-05-31 DIAGNOSIS — W19XXXA Unspecified fall, initial encounter: Secondary | ICD-10-CM

## 2018-05-31 DIAGNOSIS — Z794 Long term (current) use of insulin: Principal | ICD-10-CM

## 2018-05-31 DIAGNOSIS — R600 Localized edema: Secondary | ICD-10-CM

## 2018-05-31 DIAGNOSIS — N183 Chronic kidney disease, stage 3 unspecified: Secondary | ICD-10-CM

## 2018-05-31 DIAGNOSIS — E1122 Type 2 diabetes mellitus with diabetic chronic kidney disease: Secondary | ICD-10-CM

## 2018-05-31 NOTE — Chronic Care Management (AMB) (Signed)
  Care Management Note   Helen Richardson is a 85 y.o. year old female who is a primary care patient of Glendale Chard, MD. The CM team was consulted for assistance with chronic disease management and care coordination.   I spoke to Helen Richardson by phone today and she gave me verbal consent to contact her son Helen Richardson to introduce the CCM program and to coordinate the next call with both the patient and her son.   I reached out to [employer] and her son Helen Richardson by phone today.   Helen Richardson was given information about Chronic Care Management services today including:  1. CCM service includes personalized support from designated clinical staff supervised by her physician, including individualized plan of care and coordination with other care providers 2. 24/7 contact phone numbers for assistance for urgent and routine care needs. 3. Service will only be billed when office clinical staff spend 20 minutes or more in a month to coordinate care. 4. Only one practitioner may furnish and bill the service in a calendar month. 5. The patient may stop CCM services at any time (effective at the end of the month) by phone call to the office staff. 6. The patient will be responsible for cost sharing (co-pay) of up to 20% of the service fee (after annual deductible is met). Patient agreed to services and verbal consent obtained.    Review of patient status, including review of consultants reports, relevant laboratory and other test results, and collaboration with appropriate care team members and the patient's provider was performed as part of comprehensive patient evaluation and provision of chronic care management services.    Follow Up Plan: Telephone follow up appointment with CCM team member scheduled for: 06/04/18 '@2'$ :00 PM    Barb Merino, RN,CCM Care Management Coordinator [place] [facility]  Direct Phone: [phone]

## 2018-05-31 NOTE — Chronic Care Management (AMB) (Signed)
  Chronic Care Management   Follow Up Note   05/31/2018 Name: Helen Richardson MRN: [id number] DOB: 07/28/1933  Referred by: Dorothyann Peng, MD Reason for referral : Chronic Care Management (Inbound call from caregiver)   Helen Richardson is a 85 y.o. year old female who is a primary care patient of Dorothyann Peng, MD. The CCM team was consulted for assistance with chronic disease management and care coordination needs.    Review of patient status, including review of consultants reports, relevant laboratory and other test results, and collaboration with appropriate care team members and the patient's provider was performed as part of comprehensive patient evaluation and provision of chronic care management services.    I spoke with Helen Richardson by telephone today via inbound call from [place].   Goals Addressed    . "I think my grandmother has CHF"       Grandson stated:   Current Barriers:  [name] Kitchen Knowledge Deficits related to cause for lower leg edema and recent fall   Nurse Case Manager Clinical Goal(s):  [name] Kitchen Over the next 30 days, patient and grandson Helen Richardson will work with the CCM team to address care coordination needs and disease specific education needs for health related comorbidies and how to self manage.   Interventions:   Telephone follow up with caregiver and grandson Helen Richardson via inbound call from [place] (pt gave verbal consent to speak with grandson Helen Richardson during last call)  Provided patient and/or caregiver with verbal information about the CCM program . Discussed plans with patient for ongoing care management follow up and provided patient with direct contact information for care management team . Scheduled a more in depth CCM follow up call with grandson Helen Richardson and Helen Richardson for 06/04/18 @2pm   Patient Self Care Activities:  Helen Richardson verbalizes understanding of the education/information provided during today's call  . Helen Richardson will complete a more in depth call for goal setting with the CCM team in place of his father Zila Leas on 06/04/18 at or around [facility] due to Helen Richardson is more involved with his grandmother's care  Initial goal documentation      Telephone follow up appointment with CCM team member scheduled for: 06/04/18 @2 :00 PM    Delsa Sale, RN,CCM Care Management Coordinator [facility] Care Management/[facility]  Direct Phone: [phone]

## 2018-05-31 NOTE — Patient Instructions (Signed)
Visit Information  Helen Richardson was given information about Chronic Care Management services today including:  1. CCM service includes personalized support from designated clinical staff supervised by her physician, including individualized plan of care and coordination with other care providers 2. 24/7 contact phone numbers for assistance for urgent and routine care needs. 3. Service will only be billed when office clinical staff spend 20 minutes or more in a month to coordinate care. 4. Only one practitioner may furnish and bill the service in a calendar month. 5. The patient may stop CCM services at any time (effective at the end of the month) by phone call to the office staff. 6. The patient will be responsible for cost sharing (co-pay) of up to 20% of the service fee (after annual deductible is met).  Patient agreed to services and verbal consent obtained.   The patient verbalized understanding of instructions provided today and declined a print copy of patient instruction materials.   Telephone follow up appointment with CCM team member scheduled for: 06/04/18 '@2' :00 PM   Barb Merino, [facility] Care Management Coordinator [place] [facility]  Direct Phone: [phone]

## 2018-05-31 NOTE — Patient Instructions (Signed)
Visit Information  Goals Addressed    . "I think my grandmother has CHF"       Grandson stated:   Current Barriers:  [name] Kitchen Knowledge Deficits related to cause for lower leg edema and recent fall   Nurse Case Manager Clinical Goal(s):  [name] Kitchen Over the next 30 days, patient and grandson Jalysia Durden will work with the CCM team to address care coordination needs and disease specific education needs for health related comorbidies and how to self manage.   Interventions:   Telephone follow up with caregiver and grandson Tzipa Appiah via inbound call from [place] (pt gave verbal consent to speak with grandson Etosha Hrbek during last call)  Provided patient and/or caregiver with verbal information about the CCM program . Discussed plans with patient for ongoing care management follow up and provided patient with direct contact information for care management team . Scheduled a more in depth CCM follow up call with grandson [name] and Mammie Russian for 06/04/18 @2pm   Patient Self Care Activities:  Wyline Mood verbalizes understanding of the education/information provided during today's call  . Charlesha Weeks will complete a more in depth call for goal setting with the CCM team in place of his father Tamalyn Melka on 06/04/18 at or around [facility] due to [name] is more involved with his grandmother's care  Initial goal documentation     Grandson verbalizes understanding of today's visit and declines a copy of the AVS  Telephone follow up appointment with CCM team member scheduled for: 06/04/18 @2 :00 PM   Delsa Sale, RN,CCM Care Management Coordinator [facility] Care Management/[facility]  Direct Phone: [phone]

## 2018-06-01 ENCOUNTER — Telehealth: Payer: Self-pay

## 2018-06-01 NOTE — Telephone Encounter (Signed)
I returned a call to the pt's grandson about if his forms were ready for pickup to [phone], I couldn't leave a message because the voicemail wasn't setup.

## 2018-06-04 ENCOUNTER — Ambulatory Visit: Payer: Medicare Other

## 2018-06-04 ENCOUNTER — Ambulatory Visit: Payer: Self-pay

## 2018-06-04 ENCOUNTER — Telehealth: Payer: Medicare Other

## 2018-06-04 ENCOUNTER — Other Ambulatory Visit: Payer: Self-pay

## 2018-06-04 ENCOUNTER — Encounter: Payer: Self-pay | Admitting: Nurse Practitioner

## 2018-06-04 DIAGNOSIS — Z6841 Body Mass Index (BMI) 40.0 and over, adult: Secondary | ICD-10-CM

## 2018-06-04 DIAGNOSIS — W19XXXA Unspecified fall, initial encounter: Secondary | ICD-10-CM

## 2018-06-04 DIAGNOSIS — R600 Localized edema: Secondary | ICD-10-CM

## 2018-06-04 DIAGNOSIS — N183 Chronic kidney disease, stage 3 unspecified: Secondary | ICD-10-CM

## 2018-06-04 DIAGNOSIS — E1122 Type 2 diabetes mellitus with diabetic chronic kidney disease: Secondary | ICD-10-CM

## 2018-06-04 DIAGNOSIS — Z794 Long term (current) use of insulin: Principal | ICD-10-CM

## 2018-06-04 NOTE — Chronic Care Management (AMB) (Signed)
Chronic Care Management    Clinical Social Work General Note  06/04/2018 Name: Helen Richardson MRN: [id number] DOB: May 05, 1933  Helen Richardson is a 85 y.o. year old female who is a primary care patient of Glendale Chard, MD. The CCM was consulted to assist the patient with chronic disease management and care coordination of community resource needs.  Helen Richardson was given information about Chronic Care Management services today including:  1. CCM service includes personalized support from designated clinical staff supervised by her physician, including individualized plan of care and coordination with other care providers 2. 24/7 contact phone numbers for assistance for urgent and routine care needs. 3. Service will only be billed when office clinical staff spend 20 minutes or more in a month to coordinate care. 4. Only one practitioner may furnish and bill the service in a calendar month. 5. The patient may stop CCM services at any time (effective at the end of the month) by phone call to the office staff. 6. The patient will be responsible for cost sharing (co-pay) of up to 20% of the service fee (after annual deductible is met).  Patient agreed to services and verbal consent obtained.   Review of patient status, including review of consultants reports, relevant laboratory and other test results, and collaboration with appropriate care team members and the patient's provider was performed as part of comprehensive patient evaluation and provision of chronic care management services.    I participated in a collaborative call with CCM RN case manager to the patients son, Reita Cliche and Maryfrances Bunnell.  SDOH (Social Determinants of Health) screening performed today. See Care Plan Entry related to challenges with: home modifications, caregiver and transportation resources.  Goals Addressed            This Visit's Progress     Patient Stated   . "We need help with home repairs" (pt-stated)        Grandson stated  Current Barriers:  . Financial constraints . Lacks knowledge of community resource: community housing solutions  Clinical Social Work Clinical Goal(s):  [name] Kitchen Over the next 60 days, client will work with SW to address concerns related to home modifications  Interventions: . Family interviewed and appropriate assessments performed . Provided family with information about community housing solutions . Discussed plans with family for ongoing care management follow up and provided patient with direct contact information for care management team  . Placed a referral via [facility] to [place]  Patient Self Care Activities:  . Attends all scheduled provider appointments . Calls pharmacy for medication refills . Calls provider office for new concerns or questions   Initial goal documentation     . "We need more help in the home" (pt-stated)       Grandson and son stated:  Current Barriers:  . Financial constraints . Limited education about community resources and insurance benefits . Inability to perform ADL's independently . Inability to perform IADL's independently  Clinical Social Work Clinical Goal(s):  [name] Kitchen Over the next 45 days, client will work with SW to address concerns related to obtaining a caregiver . Over the next 10 days, client will follow up with DSS to complete a Medicaid application as directed by SW  Interventions: . Family interviewed and appropriate assessments performed . Provided family with information about PCS services under the Medicaid benefit . Discussed plans with family for ongoing care management follow up and provided patient with direct contact information for care management team .  Advised family to apply for Medicaid  . Provided the family with the web-site to complete an online Medicaid application . Educated the patients son and grandson on the [facility] block grant in home aide program . Obtained  permission to place the patient on the in home aide waiting list in case the patient does not qualify for Medicaid  Patient Self Care Activities:  . Currently UNABLE TO independently perform iADL's and ADL's  . Relies on family members to assist with care needs  Initial goal documentation     . "We want back-up trnsportation options" (pt-stated)       Grandson Stated:  Current Barriers:  . Limited social support . ADL IADL limitations . Lacks knowledge of community resource: SCAT  Clinical Social Work Clinical Goal(s):  [name] Kitchen Over the next 30 days, client will work with SW to address concerns related to transportation resources  Interventions: . Family interviewed and appropriate assessments performed to complete a SCAT application . Provided family with information about SCAT transportation services . Discussed plans with family for ongoing care management follow up and provided patient with direct contact information for care management team . Informed the family on SW plan to assist with the completion of SCAT application . Educated the patients family on the SCAT application process  Patient Self Care Activities:  . Currently UNABLE TO independently drive self   Initial goal documentation         Follow Up Plan: SW will follow up with the patients grand-son Helen Richardson in the next 7-10 days.       Daneen Schick, BSW, CDP [facility] / [facility] Care Management Social Worker [phone]  Total time spent performing care coordination and/or care management activities with the patient by phone or face to face = 62 minutes.

## 2018-06-04 NOTE — Patient Instructions (Signed)
Social Worker Visit Information  Goals we discussed today:  Goals Addressed            This Visit's Progress     Patient Stated   . "We need help with home repairs" (pt-stated)       Grandson stated  Current Barriers:  . Financial constraints . Lacks knowledge of community resource: community housing solutions  Clinical Social Work Clinical Goal(s):  [name] Kitchen Over the next 60 days, client will work with SW to address concerns related to home modifications  Interventions: . Family interviewed and appropriate assessments performed . Provided family with information about community housing solutions . Discussed plans with family for ongoing care management follow up and provided patient with direct contact information for care management team  . Placed a referral via [employer] to [facility]  Patient Self Care Activities:  . Attends all scheduled provider appointments . Calls pharmacy for medication refills . Calls provider office for new concerns or questions   Initial goal documentation     . "We need more help in the home" (pt-stated)       Grandson and son stated:  Current Barriers:  . Financial constraints . Limited education about community resources and insurance benefits . Inability to perform ADL's independently . Inability to perform IADL's independently  Clinical Social Work Clinical Goal(s):  [name] Kitchen Over the next 45 days, client will work with SW to address concerns related to obtaining a caregiver . Over the next 10 days, client will follow up with DSS to complete a Medicaid application as directed by SW  Interventions: . Family interviewed and appropriate assessments performed . Provided family with information about PCS services under the Medicaid benefit . Discussed plans with family for ongoing care management follow up and provided patient with direct contact information for care management team . Advised family to apply for Medicaid   . Provided the family with the web-site to complete an online Medicaid application . Educated the patients son and grandson on the [facility] block grant in home aide program . Obtained permission to place the patient on the in home aide waiting list in case the patient does not qualify for Medicaid  Patient Self Care Activities:  . Currently UNABLE TO independently perform iADL's and ADL's  . Relies on family members to assist with care needs  Initial goal documentation     . "We want back-up trnsportation options" (pt-stated)       Grandson Stated:  Current Barriers:  . Limited social support . ADL IADL limitations . Lacks knowledge of community resource: SCAT  Clinical Social Work Clinical Goal(s):  [name] Kitchen Over the next 30 days, client will work with SW to address concerns related to transportation resources  Interventions: . Family interviewed and appropriate assessments performed to complete a SCAT application . Provided family with information about SCAT transportation services . Discussed plans with family for ongoing care management follow up and provided patient with direct contact information for care management team . Informed the family on SW plan to assist with the completion of SCAT application . Educated the patients family on the SCAT application process  Patient Self Care Activities:  . Currently UNABLE TO independently drive self   Initial goal documentation         Materials Provided: Verbal education about community resources provided by phone  Follow Up Plan: SW will follow up with patient by phone over the next 7-10 days   Bevelyn Ngo, BSW, CDP [facility] /  [facility] Care Management Social Worker [phone]

## 2018-06-07 ENCOUNTER — Ambulatory Visit: Payer: Self-pay

## 2018-06-07 ENCOUNTER — Telehealth: Payer: Self-pay

## 2018-06-07 ENCOUNTER — Other Ambulatory Visit: Payer: Self-pay

## 2018-06-07 ENCOUNTER — Telehealth: Payer: Medicare Other

## 2018-06-07 ENCOUNTER — Encounter: Payer: Self-pay | Admitting: Internal Medicine

## 2018-06-07 ENCOUNTER — Ambulatory Visit: Payer: Medicare Other | Admitting: Internal Medicine

## 2018-06-07 DIAGNOSIS — R600 Localized edema: Secondary | ICD-10-CM

## 2018-06-07 DIAGNOSIS — N183 Chronic kidney disease, stage 3 unspecified: Secondary | ICD-10-CM

## 2018-06-07 DIAGNOSIS — E1122 Type 2 diabetes mellitus with diabetic chronic kidney disease: Secondary | ICD-10-CM

## 2018-06-07 DIAGNOSIS — Z794 Long term (current) use of insulin: Secondary | ICD-10-CM

## 2018-06-07 DIAGNOSIS — W19XXXA Unspecified fall, initial encounter: Secondary | ICD-10-CM

## 2018-06-07 NOTE — Telephone Encounter (Signed)
Verified the patient's medications and also told Demetria the pt's daughter in law that I've been trying to call a Marda Mir [phone]) the pt's grandson about a form that was dropped off but his voicemail isn't setup.

## 2018-06-07 NOTE — Chronic Care Management (AMB) (Signed)
Chronic Care Management    Clinical Social Work Follow Up Note  06/07/2018 Name: Helen Richardson MRN: [id number] DOB: 04/24/1933  Helen Richardson is a 85 y.o. year old female who is a primary care patient of [name], [name], MD. The CCM team was consulted for assistance with [employer].   Review of patient status, including review of consultants reports, other relevant assessments, and collaboration with appropriate care team members and the patient's provider was performed as part of comprehensive patient evaluation and provision of chronic care management services.     Goals Addressed            This Visit's Progress     Patient Stated   . "We need help with home repairs" (pt-stated)       Grandson stated  Current Barriers:  . Financial constraints . Lacks knowledge of community resource: community housing solutions  Clinical Social Work Clinical Goal(s):  [name] Kitchen. Over the next 60 days, client will work with SW to address concerns related to home modifications  Interventions: . Obtained notification the patients referral has been received by [employer]  Patient Self Care Activities:  . Attends all scheduled provider appointments . Calls pharmacy for medication refills . Calls provider office for new concerns or questions   Please see past updates related to this goal by clicking on the "Past Updates" button in the selected goal      . "We need more help in the home" (pt-stated)       Grandson and son stated:  Current Barriers:  . Financial constraints . Limited education about community resources and insurance benefits . Inability to perform ADL's independently . Inability to perform IADL's independently  Clinical Social Work Clinical Goal(s):  [name] Kitchen. Over the next 45 days, client will work with SW to address concerns related to obtaining a caregiver . Over the next 10 days, client will follow up with DSS to complete a Medicaid application as directed  by SW  Interventions: . Contacted the DSS in-home aide program in order to place the patient on the wait list . Left a voice message with on-call SW, Cristino MartesJewel Singleton requesting a return call  Patient Self Care Activities:  . Currently UNABLE TO independently perform iADL's and ADL's  . Relies on family members to assist with care needs  Please see past updates related to this goal by clicking on the "Past Updates" button in the selected goal      . "We want back-up trnsportation options" (pt-stated)       Grandson Stated:  Current Barriers:  . Limited social support . ADL IADL limitations . Lacks knowledge of community resource: SCAT  Clinical Social Work Clinical Goal(s):  [name] Kitchen. Over the next 30 days, client will work with SW to address concerns related to transportation resources  Interventions: . Completed Part A of SCAT application . Collaborated with patients primary provider regarding completion of part B   Patient Self Care Activities:  . Currently UNABLE TO independently drive self   Please see past updates related to this goal by clicking on the "Past Updates" button in the selected goal          Follow Up Plan: SW will outreach the in home aide program within the next 3 days if a return call is not received. SW will maintain contact with the patients family regarding progression of above goals.  Bevelyn NgoKendra Mala Gibbard, BSW, CDP [facility] / [facility] Care Management Social Worker [phone]  Total time  spent performing care coordination and/or care management activities with the patient by phone or face to face = 20 minutes.

## 2018-06-07 NOTE — Telephone Encounter (Signed)
The patient was asked what her sugars have been the past few days. The pt said today it was 250, Sunday 220, Sat 231.  The pt was asked what has she been eating and drinking, the pt said that she has had bacon, grits, soup a few days, one day steak, potatoes, and one day she had [place] D's/  The pt said she drinks coffee with sweet n low and that she drinks water the rest of the day.  I told the pt that I would let Dr. Allyne Gee know.

## 2018-06-07 NOTE — Patient Instructions (Signed)
Visit Information  Goals Addressed      Patient Stated   . "Her blood sugars are running around 200 in the morning before breakfast" (pt-stated)       Grandson stated  Current Barriers:  [name] Kitchen Knowledge Deficits related to Diabetes and Self Health Management   Nurse Case Manager Clinical Goal(s):  [name] Kitchen Over the next 30 days, patient/caregiver will verbalize basic understanding of Diabetes disease process and Self Health Management plan as evidenced by grandson [name] will verbalize increased understanding of the disease process for Diabetes including how to meal plan, carb counting, signs/symptoms of hypo/hyperglycemic events.   Interventions:   Collaborative telephone outreach with [employer], grandson [place], and son Londin Bushnell . Evaluation of current treatment plan related to Diabetes and patient's adherence to plan as established by provider . Provided education to patient re: current A1C verses target A1C and how to best acheive . Reviewed medications with grandson, medication reconciliation completed, financial hardship identified . Collaborated with Vanice Sarah Pharm D regarding assistance with cost of insulin . Discussed plans with son/grandson for ongoing care management follow up and provided patient with direct contact information for care management team . Collaboration with Mariam Dollar, CMA re: providing patient/grandson with printed Diabetic education materials from office during 06/07/18 f/u visit (Living Well with Diabetes, Meal Planning, Know Your A1C, s/s of hypo/hyperglycemia) . Scheduled CCM follow up visit with grandson [name]   Patient Self Care Activities:   Trenton Gammon understanding of the information and education provided . Currently UNABLE TO independently provide self-care  . Self administers medications as prescribed (grandson is preparing all meds using a pill bed) . Attends all scheduled provider appointments (grandson and family are  providing transportation) . Grandson calls pharmacy for medication refills . Needs assistance with ADL's independently . Needs assistance with IADL's independently . Leanna Battles calls provider office for new concerns or questions  Initial goal documentation      Other   . "My grandmother's had a couple of falls"       Grandson stated:  Current Barriers:  [name] Kitchen Knowledge Deficits related to falls and prevention  . Impaired Physical Mobility . Inability to access appropriate DME in the home due to aged architecture structures of home  Nurse Case Manager Clinical Goal(s):  [name] Kitchen Over the next 30 days, patient and caregivers will verbalize understanding of plan for in home SN and PT services.  Interventions:   Collaborative telephone outreach with [employer], grandson Darthula Helfand, and son Williette Alsman  Assessed for falls and or near falls (grandson reports 2 falls, 1 in which the patient accessed her emergency alert system at 3 am)  Assessed for DME and current use of DME available (grandson purchased a walker, however, doorways are too narrow, patient is currently using a 4 legged cane)  Education provided related to in home SN and PT services to assist with medication management and PT for strengthening and balance   Education given related to need for a HSE (home safety evaluation)  Educated on importance of patient wearing good supportives shoes during ambulation, keeping floors clutter free, remove scatter rugs and keep a night light . Collaborated with Dr. Allyne Gee via in basket requesting order for in home SN & PT, including request for a HSE . Discussed plans with patient for ongoing care management follow up and provided patient with direct contact information for care management team . Reviewed scheduled/upcoming provider appointments including: f/u appt with Dr. Allyne Gee for 06/07/18  at 10:15 AM . Scheduled a follow up call with grandson [name]  Patient Self  Care Activities:   Trenton Gammon understanding of the information and education provided . Currently UNABLE TO independently provide self-care  . Self administers medications as prescribed (grandson is preparing all meds using a pill bed) . Attends all scheduled provider appointments (grandson and family are providing transportation) . Grandson calls pharmacy for medication refills . Needs assistance with ADL's independently . Needs assistance with IADL's independently . Leanna Battles calls provider office for new concerns or questions  Initial goal documentation       The patient verbalized understanding of instructions provided today and declined a print copy of patient instruction materials.   The CM team will reach out to the patient again over the next 7-10 days.   Delsa Sale, RN,CCM Care Management Coordinator [facility] Care Management/[facility]  Direct Phone: [phone]

## 2018-06-07 NOTE — Chronic Care Management (AMB) (Signed)
Chronic Care Management   Initial Visit Note  06/07/2018 Name: Helen Richardson MRN: [id number] DOB: February 17, 1934  Referred by: Dorothyann Peng, MD Reason for referral : No chief complaint on file.   Helen Richardson is a 85 y.o. year old female who is a primary care patient of Dorothyann Peng, MD. The CCM team was consulted for assistance with chronic disease management and care coordination needs.   Review of patient status, including review of consultants reports, relevant laboratory and other test results, and collaboration with appropriate care team members and the patient's provider was performed as part of comprehensive patient evaluation and provision of chronic care management services.    Collaborative telephone outreach with [employer], grandson [place], and son Retaj Sweeton to assess for CCM needs and initial goal setting.   Objective:  Lab Results  Component Value Date   HGBA1C 10.4 (H) 03/08/2018   HGBA1C 9.6 (H) 11/30/2017   Lab Results  Component Value Date   LDLCALC 39 03/08/2018   CREATININE 1.13 (H) 05/14/2018    BP Readings from Last 3 Encounters:  05/21/18 124/86  05/18/18 114/70  05/14/18 115/66    Goals Addressed      Grandson stated   . "Her blood sugars are running around 200 in the morning before breakfast" (pt-stated)        Current Barriers:  [name] Kitchen Knowledge Deficits related to Diabetes and Self Health Management   Nurse Case Manager Clinical Goal(s):  [name] Kitchen Over the next 30 days, patient/caregiver will verbalize basic understanding of Diabetes disease process and Self Health Management plan as evidenced by grandson [name] will verbalize increased understanding of the disease process for Diabetes including how to meal plan, carb counting, signs/symptoms of hypo/hyperglycemic events.   Interventions:   Collaborative telephone outreach with [employer], grandson [place], and son Geneiveve Deus . Evaluation of current treatment plan  related to Diabetes and patient's adherence to plan as established by provider . Provided education to patient re: current A1C verses target A1C and how to best acheive . Reviewed medications with grandson, medication reconciliation completed, financial hardship identified . Collaborated with Vanice Sarah Pharm D regarding assistance with cost of insulin . Discussed plans with son/grandson for ongoing care management follow up and provided patient with direct contact information for care management team . Collaboration with Mariam Dollar, CMA re: providing patient/grandson with printed Diabetic education materials from office during 06/07/18 f/u visit (Living Well with Diabetes, Meal Planning, Know Your A1C, s/s of hypo/hyperglycemia) . Scheduled CCM follow up visit with grandson [name]   Patient Self Care Activities:   Trenton Gammon understanding of the information and education provided . Currently UNABLE TO independently provide self-care  . Self administers medications as prescribed (grandson is preparing all meds using a pill bed) . Attends all scheduled provider appointments (grandson and family are providing transportation) . Grandson calls pharmacy for medication refills . Needs assistance with ADL's independently . Needs assistance with IADL's independently . Leanna Battles calls provider office for new concerns or questions  Initial goal documentation      Other   . "My grandmother's had a couple of falls"       Grandson stated:  Current Barriers:  [name] Kitchen Knowledge Deficits related to falls and prevention  . Impaired Physical Mobility . Inability to access appropriate DME in the home due to aged architecture structures of home  Nurse Case Manager Clinical Goal(s):  [name] Kitchen Over the next 30 days, patient and caregivers will  verbalize understanding of plan for in home SN and PT services.  Interventions:   Collaborative telephone outreach with [employer] Kendra Humble, grandson Katherine RoanBrandon  Moure, and son Luiz OchoaDaryl Hansen  Assessed for falls and or near falls (grandson reports 2 falls, 1 in which the patient accessed her emergency alert system at 3 am)  Assessed for DME and current use of DME available (grandson purchased a walker, however, doorways are too narrow, patient is currently using a 4 legged cane)  Education provided related to in home SN and PT services to assist with medication management and PT for strengthening and balance   Education given related to need for a HSE (home safety evaluation)  Educated on importance of patient wearing good supportives shoes during ambulation, keeping floors clutter free, remove scatter rugs and keep a night light . Collaborated with [name] via in basket requesting order for in home SN & PT, including request for a HSE . Discussed plans with patient for ongoing care management follow up and provided patient with direct contact information for care management team . Reviewed scheduled/upcoming provider appointments including: f/u appt with [name] for 06/07/18 at 10:15 AM . Scheduled a follow up call with grandson Katherine RoanBrandon Brower  Patient Self Care Activities:   Trenton GammonVerbalizes understanding of the information and education provided . Currently UNABLE TO independently provide self-care  . Self administers medications as prescribed (grandson is preparing all meds using a pill bed) . Attends all scheduled provider appointments (grandson and family are providing transportation) . Grandson calls pharmacy for medication refills . Needs assistance with ADL's independently . Needs assistance with IADL's independently . Leanna BattlesGrandson Brandon calls provider office for new concerns or questions  Initial goal documentation        The CM team will reach out to the patient again over the next 7-10 days.   Delsa SaleAngel Janesia Joswick, RN,CCM Care Management Coordinator [facility] Care [facility]  Direct Phone: [phone]

## 2018-06-07 NOTE — Patient Instructions (Signed)
Social Worker Visit Information  Goals we discussed today:  Goals Addressed            This Visit's Progress     Patient Stated   . "We need help with home repairs" (pt-stated)       Grandson stated  Current Barriers:  . Financial constraints . Lacks knowledge of community resource: community housing solutions  Clinical Social Work Clinical Goal(s):  [name] Kitchen Over the next 60 days, client will work with SW to address concerns related to home modifications  Interventions: . Obtained notification the patients referral has been received by [employer]  Patient Self Care Activities:  . Attends all scheduled provider appointments . Calls pharmacy for medication refills . Calls provider office for new concerns or questions   Please see past updates related to this goal by clicking on the "Past Updates" button in the selected goal      . "We need more help in the home" (pt-stated)       Grandson and son stated:  Current Barriers:  . Financial constraints . Limited education about community resources and insurance benefits . Inability to perform ADL's independently . Inability to perform IADL's independently  Clinical Social Work Clinical Goal(s):  [name] Kitchen Over the next 45 days, client will work with SW to address concerns related to obtaining a caregiver . Over the next 10 days, client will follow up with DSS to complete a Medicaid application as directed by SW  Interventions: . Contacted the DSS in-home aide program in order to place the patient on the wait list . Left a voice message with on-call SW, Cristino Martes requesting a return call  Patient Self Care Activities:  . Currently UNABLE TO independently perform iADL's and ADL's  . Relies on family members to assist with care needs  Please see past updates related to this goal by clicking on the "Past Updates" button in the selected goal      . "We want back-up trnsportation options" (pt-stated)        Grandson Stated:  Current Barriers:  . Limited social support . ADL IADL limitations . Lacks knowledge of community resource: SCAT  Clinical Social Work Clinical Goal(s):  [name] Kitchen Over the next 30 days, client will work with SW to address concerns related to transportation resources  Interventions: . Completed Part A of SCAT application . Collaborated with patients primary provider regarding completion of part B   Patient Self Care Activities:  . Currently UNABLE TO independently drive self   Please see past updates related to this goal by clicking on the "Past Updates" button in the selected goal          Materials Provided: No. Patient not reached.  Follow Up Plan: SW will follow up with patient by phone over the next 7-10 days   Bevelyn Ngo, [place], CDP [facility] / [facility] Care Management Social Worker [phone]

## 2018-06-07 NOTE — Progress Notes (Signed)
Subjective:     Patient ID: Helen Richardson , female    DOB: [birth year] , 85 y.o.   MRN: [id number]   Chief Complaint  Patient presents with  . Edema    1 fall    HPI  She is brought here today by her granddaughter for further evaluation of lower extremity edema. She reports her legs have been more swollen than usual. She denies chest pain. She does admit to feeling sob with exertion. She denies being sob at rest. She denies orthopnea.  She initially denied knowing what triggered her symptoms. However, after further questioning she admits that she does add salt to her foods. Additionally, she does report eating packaged foods as well.     Past Medical History:  Diagnosis Date  . Diabetes mellitus without complication (HCC)   . High cholesterol   . HTN (hypertension)   . Obesity      Family History  Problem Relation Age of Onset  . Diabetes Mother   . Hypertension Mother   . Hypertension Father   . Stroke Father      Current Outpatient Medications:  .  ADVOCATE LANCETS MISC, , Disp: , Rfl:  .  Alcohol Swabs (ALCOHOL PREP) 70 % PADS, , Disp: , Rfl:  .  aspirin 81 MG chewable tablet, Chew 81 mg by mouth at bedtime., Disp: , Rfl:  .  Calcium Carbonate-Vitamin D (CALTRATE 600+D PO), Take 1 tablet by mouth daily., Disp: , Rfl:  .  carvedilol (COREG) 6.25 MG tablet, TAKE 1 TABLET BY MOUTH TWICE DAILY, Disp: 60 tablet, Rfl: 4 .  Cholecalciferol (VITAMIN D3 PO), Take 25 mcg by mouth daily. 1 per day, Disp: , Rfl:  .  COMFORT EZ PEN NEEDLES 32G X 4 MM MISC, , Disp: , Rfl: 5 .  furosemide (LASIX) 40 MG tablet, Take 40 mg by mouth daily. , Disp: , Rfl:  .  HUMALOG MIX 75/25 KWIKPEN (75-25) 100 UNIT/ML Kwikpen, ADMINISTER 20 UNITS UNDER THE SKIN BEFORE BREAKFAST AND DINNER. DO NOT EXCEED. MAX DOSE OF 50 UNITS A DAY, Disp: 15 mL, Rfl: 0 .  Lancet Devices (ADVOCATE LANCING DEVICE) MISC, , Disp: , Rfl:  .  polyethylene glycol powder (GLYCOLAX/MIRALAX) powder, Mix 1 capful in drink and take  by mouth 1 to 3 times daily as needed for daily soft stools  OTC, Disp: 225 g, Rfl: 0 .  potassium chloride SA (K-DUR,KLOR-CON) 20 MEQ tablet, TAKE 1 TABLET BY MOUTH TWICE A DAY (Patient taking differently: Take 20 mEq by mouth 2 (two) times daily. ), Disp: 60 tablet, Rfl: 5 .  rosuvastatin (CRESTOR) 20 MG tablet, TAKE 1 TABLET BY MOUTH EVERY DAY, Disp: 90 tablet, Rfl: 0 .  telmisartan (MICARDIS) 80 MG tablet, TAKE 1 TABLET BY MOUTH ONCE DAILY, Disp: 30 tablet, Rfl: 4 .  TRADJENTA 5 MG TABS tablet, TAKE 1 TABLET BY MOUTH ONCE DAILY (Patient taking differently: Take 5 mg by mouth daily. ), Disp: 30 tablet, Rfl: 3   No Known Allergies   Review of Systems  Constitutional: Negative.   Respiratory: Negative.   Cardiovascular: Negative.   Gastrointestinal: Negative.   Musculoskeletal: Positive for arthralgias (she c/o r shoulder pain. Admits she fell 3/18, trying to get into bed, fell onto r shoulder. doesn't recall if light was on/off.  there is some pain with movement. ).  Neurological: Negative.   Psychiatric/Behavioral: Negative.      Today's Vitals   05/21/18 1524  BP: 124/86  Pulse: 80  Temp: 98.5 F (36.9 C)  TempSrc: Oral  Height: 5\' 3"  (1.6 m)  PainSc: 8   PainLoc: Leg   Body mass index is 41.4 kg/m.   Objective:  Physical Exam Vitals signs and nursing note reviewed.  Constitutional:      Appearance: Normal appearance.  HENT:     Head: Normocephalic and atraumatic.  Cardiovascular:     Rate and Rhythm: Normal rate and regular rhythm.     Heart sounds: Normal heart sounds.  Pulmonary:     Effort: Pulmonary effort is normal.     Breath sounds: Normal breath sounds.  Musculoskeletal:     Right lower leg: 2+ Pitting Edema present.     Left lower leg: 2+ Pitting Edema present.  Skin:    General: Skin is warm.  Neurological:     General: No focal deficit present.     Mental Status: She is alert.  Psychiatric:        Mood and Affect: Mood normal.        Behavior:  Behavior normal.         Assessment And Plan:     1. Lower extremity edema  She is encouraged to elevate her legs while seated. Importance of following a low-salt diet was also discussed with the patient.   2. Fall, initial encounter  I will refer her for home health services. She is encouraged to remove all throw rugs from her home. Also, encouraged to use adequate lighting when moving around in her bedroom.   - Referral to Chronic Care Management Services  3. Dyspnea on exertion  I will check BNP and schedule her for echocardiogram.   - ECHOCARDIOGRAM COMPLETE; Future - Referral to Chronic Care Management Services  4. Acute pain of right shoulder  Resolving. She will apply topical pain cream to affected area as needed.   Gwynneth Aliment, MD    THE PATIENT IS ENCOURAGED TO PRACTICE SOCIAL DISTANCING DUE TO THE COVID-19 PANDEMIC.

## 2018-06-08 ENCOUNTER — Other Ambulatory Visit: Payer: Medicare Other

## 2018-06-08 ENCOUNTER — Other Ambulatory Visit: Payer: Self-pay

## 2018-06-08 ENCOUNTER — Ambulatory Visit: Payer: Self-pay

## 2018-06-08 ENCOUNTER — Encounter: Payer: Self-pay | Admitting: Internal Medicine

## 2018-06-08 ENCOUNTER — Telehealth: Payer: Medicare Other

## 2018-06-08 DIAGNOSIS — E1122 Type 2 diabetes mellitus with diabetic chronic kidney disease: Secondary | ICD-10-CM

## 2018-06-08 DIAGNOSIS — W19XXXA Unspecified fall, initial encounter: Secondary | ICD-10-CM

## 2018-06-08 DIAGNOSIS — N183 Chronic kidney disease, stage 3 unspecified: Secondary | ICD-10-CM

## 2018-06-08 DIAGNOSIS — R600 Localized edema: Secondary | ICD-10-CM

## 2018-06-08 DIAGNOSIS — Z6841 Body Mass Index (BMI) 40.0 and over, adult: Secondary | ICD-10-CM

## 2018-06-08 DIAGNOSIS — Z794 Long term (current) use of insulin: Secondary | ICD-10-CM

## 2018-06-08 NOTE — Patient Instructions (Signed)
Social Worker Visit Information  Goals we discussed today:  Goals Addressed            This Visit's Progress     Patient Stated   . "We want back-up trnsportation options" (pt-stated)   On track    Grandson Stated:  Current Barriers:  . Limited social support . ADL IADL limitations . Lacks knowledge of community resource: SCAT  Clinical Social Work Clinical Goal(s):  [name] Kitchen Over the next 30 days, client will work with SW to address concerns related to transportation resources  Interventions: . Collaboration with CCM RN Case Manager, Lawanna Kobus Little who received a call from the patients grandson requesting assistance with SCAT information . Successful outreach to [name] who reports he obtained SCAT application from Dr. Allyne Gee office . Reviewed applcation with Mr. Sergent  . Provided instruction on where to have the patent sign the document . Educated Mr. Rebello of the process to submit a SCAT application and how eligibility is determined . Encouraged Mr. Randle to submit the patients application as soon as possible   Patient Self Care Activities:  . Currently UNABLE TO independently drive self   Please see past updates related to this goal by clicking on the "Past Updates" button in the selected goal          Materials Provided: Verbal education about SCAT provided by phone  Follow Up Plan: SW will follow up with patient by phone over the next 2 weeks   Bevelyn Ngo, [place], CDP [facility] / [facility] Care Management Social Worker [phone]

## 2018-06-08 NOTE — Chronic Care Management (AMB) (Signed)
  Chronic Care Management   Social Work Note  06/08/2018 Name: Helen Richardson MRN: [id number] DOB: 24-Sep-1933  Helen Richardson is a 84 y.o. year old female who sees Dorothyann Peng, MD for primary care. The CCM team was consulted for assistance with [name].   Goals Addressed            This Visit's Progress     Patient Stated   . "We want back-up trnsportation options" (pt-stated)   On track    Grandson Stated:  Current Barriers:  . Limited social support . ADL IADL limitations . Lacks knowledge of community resource: SCAT  Clinical Social Work Clinical Goal(s):  [name] Kitchen Over the next 30 days, client will work with SW to address concerns related to transportation resources  Interventions: . Collaboration with CCM RN Case Manager, Lawanna Kobus Little who received a call from the patients grandson requesting assistance with SCAT information . Successful outreach to [name] who reports he obtained SCAT application from Dr. Allyne Gee office . Reviewed applcation with Mr. Sandal  . Provided instruction on where to have the patent sign the document . Educated Mr. Fripp of the process to submit a SCAT application and how eligibility is determined . Encouraged Mr. Holbert to submit the patients application as soon as possible   Patient Self Care Activities:  . Currently UNABLE TO independently drive self   Please see past updates related to this goal by clicking on the "Past Updates" button in the selected goal        Follow Up Plan: SW will follow up with patient by phone over the next 2 weeks  Bevelyn Ngo, BSW, CDP [facility] / [facility] Care Management Social Worker [phone]  Total time spent performing care coordination and/or care management activities with the patient by phone or face to face = 15 minutes.

## 2018-06-08 NOTE — Chronic Care Management (AMB) (Signed)
  Chronic Care Management   Follow Up Note   06/08/2018 Name: Helen Richardson MRN: [id number] DOB: [birth year]  Referred by: Dorothyann Peng, MD Reason for referral : Chronic Care Management (CCM TELEPHONE follow up )   Helen Richardson is a 85 y.o. year old female who is a primary care patient of Dorothyann Peng, MD. The CCM team was consulted for assistance with chronic disease management and care coordination needs.    Review of patient status, including review of consultants reports, relevant laboratory and other test results, and collaboration with appropriate care team members and the patient's provider was performed as part of comprehensive patient evaluation and provision of chronic care management services.    I spoke with Helen Richardson today by telephone.   Goals Addressed      Patient Stated   . "We want back-up trnsportation options" (pt-stated)       Grandson Stated:  Current Barriers:  . Limited social support . ADL IADL limitations . Lacks knowledge of community resource: SCAT  Clinical Social Work Clinical Goal(s):  [name] Kitchen Over the next 30 days, client will work with SW to address concerns related to transportation resources  Interventions:  Telephone CCM outreach to grandson Helen Richardson to respond to vm requesting a call back  Spoke with Helen Richardson, he is requesting help with completing the SCAT application for his grandmother's transportation  Collaboration with BSW Bevelyn Ngo, requesting she contact Helen Richardson to offer assistance; Helen Richardson will call today  Patient Self Care Activities:  . Currently UNABLE TO independently drive self   Please see past updates related to this goal by clicking on the "Past Updates" button in the selected goal         The CM team will reach out to the patient again over the next 2-3 days days.    Delsa Sale, RN,CCM Care Management Coordinator [facility] Care Management/[facility]  Direct Phone:  [phone]

## 2018-06-08 NOTE — Patient Instructions (Signed)
Visit Information  Goals Addressed      Patient Stated   . "We want back-up trnsportation options" (pt-stated)       Grandson Stated:  Current Barriers:  . Limited social support . ADL IADL limitations . Lacks knowledge of community resource: SCAT  Clinical Social Work Clinical Goal(s):  [name] Kitchen Over the next 30 days, client will work with SW to address concerns related to transportation resources  Interventions:  Telephone CCM outreach to grandson [place] to respond to vm requesting a call back  Spoke with [name], he is requesting help with completing the SCAT application for his grandmother's transportation  Collaboration with BSW Bevelyn Ngo, requesting she contact Mr. Ludovico to offer assistance; Enrique Sack will call today  Patient Self Care Activities:  . Currently UNABLE TO independently drive self   Please see past updates related to this goal by clicking on the "Past Updates" button in the selected goal         The patient verbalized understanding of instructions provided today and declined a print copy of patient instruction materials.   The CM team will reach out to the patient again over the next 2-3 days.   Delsa Sale, RN,CCM Care Management Coordinator [facility] Care Management/[facility]  Direct Phone: [phone]

## 2018-06-09 ENCOUNTER — Ambulatory Visit: Payer: Self-pay

## 2018-06-09 ENCOUNTER — Telehealth: Payer: Medicare Other

## 2018-06-09 DIAGNOSIS — N183 Chronic kidney disease, stage 3 unspecified: Secondary | ICD-10-CM

## 2018-06-09 DIAGNOSIS — W19XXXA Unspecified fall, initial encounter: Secondary | ICD-10-CM

## 2018-06-09 DIAGNOSIS — R0600 Dyspnea, unspecified: Secondary | ICD-10-CM

## 2018-06-09 DIAGNOSIS — Z794 Long term (current) use of insulin: Secondary | ICD-10-CM

## 2018-06-09 DIAGNOSIS — R600 Localized edema: Secondary | ICD-10-CM

## 2018-06-09 DIAGNOSIS — R0609 Other forms of dyspnea: Secondary | ICD-10-CM

## 2018-06-09 DIAGNOSIS — E1122 Type 2 diabetes mellitus with diabetic chronic kidney disease: Secondary | ICD-10-CM

## 2018-06-09 LAB — BMP8+EGFR
BUN/Creatinine Ratio: 14 (ref 12–28)
BUN: 17 mg/dL (ref 8–27)
CO2: 21 mmol/L (ref 20–29)
Calcium: 9.1 mg/dL (ref 8.7–10.3)
Chloride: 96 mmol/L (ref 96–106)
Creatinine, Ser: 1.25 mg/dL — ABNORMAL HIGH (ref 0.57–1.00)
GFR calc Af Amer: 46 mL/min/{1.73_m2} — ABNORMAL LOW (ref >59)
GFR calc non Af Amer: 40 mL/min/{1.73_m2} — ABNORMAL LOW (ref >59)
Glucose: 259 mg/dL — ABNORMAL HIGH (ref 65–99)
Potassium: 5.3 mmol/L — ABNORMAL HIGH (ref 3.5–5.2)
Sodium: 132 mmol/L — ABNORMAL LOW (ref 134–144)

## 2018-06-09 NOTE — Chronic Care Management (AMB) (Signed)
  Chronic Care Management   Social Work Note  06/09/2018 Name: Helen Richardson MRN: [id number] DOB: Sep 25, 1933  Helen Richardson is a 85 y.o. year old female who sees Dorothyann Peng, MD for primary care. The CCM team was consulted for assistance with chronic care management and care coordination of patient resource needs.   Goals Addressed            This Visit's Progress     Patient Stated   . "We need more help in the home" (pt-stated)       Grandson and son stated:  Current Barriers:  . Financial constraints . Limited education about community resources and insurance benefits . Inability to perform ADL's independently . Inability to perform IADL's independently  Clinical Social Work Clinical Goal(s):  [name] Kitchen Over the next 45 days, client will work with SW to address concerns related to obtaining a caregiver . Over the next 10 days, client will follow up with DSS to complete a Medicaid application as directed by SW  Interventions: . Received voice message from Mrs. Ladona Ridgel [facility] home aide program . SW returned call to Mrs Ladona Ridgel; left voice message requesting a return call . Successfully placed the patient on the in home caregiver wait list  Patient Self Care Activities:  . Currently UNABLE TO independently perform iADL's and ADL's  . Relies on family members to assist with care needs  Please see past updates related to this goal by clicking on the "Past Updates" button in the selected goal         Follow Up Plan: SW will follow up with patient by phone over the next 7-14 days.  Bevelyn Ngo, BSW, CDP [facility] / [facility] Care Management Social Worker [phone]  Total time spent performing care coordination and/or care management activities with the patient by phone or face to face = 15 minutes.

## 2018-06-09 NOTE — Patient Instructions (Signed)
Social Worker Visit Information  Goals we discussed today:  Goals Addressed            This Visit's Progress     Patient Stated   . "We need more help in the home" (pt-stated)       Grandson and son stated:  Current Barriers:  . Financial constraints . Limited education about community resources and insurance benefits . Inability to perform ADL's independently . Inability to perform IADL's independently  Clinical Social Work Clinical Goal(s):  [name] Kitchen Over the next 45 days, client will work with SW to address concerns related to obtaining a caregiver . Over the next 10 days, client will follow up with DSS to complete a Medicaid application as directed by SW  Interventions: . Received voice message from Mrs. Ladona Ridgel of the [facility] in home aide program . SW returned call to Mrs Ladona Ridgel; left voice message requesting a return call . Successfully placed the patient on the in home caregiver wait list  Patient Self Care Activities:  . Currently UNABLE TO independently perform iADL's and ADL's  . Relies on family members to assist with care needs  Please see past updates related to this goal by clicking on the "Past Updates" button in the selected goal          Materials Provided: No. Patient not reached.  Follow Up Plan: SW will follow up with patient by phone over the next 7-14 days.  Bevelyn Ngo, BSW, CDP [facility] / [facility] Care Management Social Worker [phone]

## 2018-06-10 ENCOUNTER — Telehealth: Payer: Self-pay

## 2018-06-10 ENCOUNTER — Encounter (HOSPITAL_COMMUNITY): Payer: Self-pay

## 2018-06-10 ENCOUNTER — Emergency Department (HOSPITAL_COMMUNITY): Payer: Medicare Other

## 2018-06-10 ENCOUNTER — Ambulatory Visit: Payer: Self-pay

## 2018-06-10 ENCOUNTER — Telehealth: Payer: Medicare Other

## 2018-06-10 ENCOUNTER — Other Ambulatory Visit: Payer: Self-pay

## 2018-06-10 ENCOUNTER — Inpatient Hospital Stay (HOSPITAL_COMMUNITY)
Admission: EM | Admit: 2018-06-10 | Discharge: 2018-06-12 | DRG: 872 | Disposition: A | Discharge status: Home Health | Payer: Medicare Other | Attending: Internal Medicine | Admitting: Internal Medicine

## 2018-06-10 DIAGNOSIS — Z8249 Family history of ischemic heart disease and other diseases of the circulatory system: Secondary | ICD-10-CM

## 2018-06-10 DIAGNOSIS — N179 Acute kidney failure, unspecified: Secondary | ICD-10-CM | POA: Diagnosis present

## 2018-06-10 DIAGNOSIS — R700 Elevated erythrocyte sedimentation rate: Secondary | ICD-10-CM | POA: Diagnosis present

## 2018-06-10 DIAGNOSIS — M25562 Pain in left knee: Secondary | ICD-10-CM | POA: Diagnosis present

## 2018-06-10 DIAGNOSIS — N183 Chronic kidney disease, stage 3 unspecified: Secondary | ICD-10-CM

## 2018-06-10 DIAGNOSIS — E1165 Type 2 diabetes mellitus with hyperglycemia: Secondary | ICD-10-CM | POA: Diagnosis present

## 2018-06-10 DIAGNOSIS — M25542 Pain in joints of left hand: Secondary | ICD-10-CM | POA: Diagnosis present

## 2018-06-10 DIAGNOSIS — E1122 Type 2 diabetes mellitus with diabetic chronic kidney disease: Secondary | ICD-10-CM

## 2018-06-10 DIAGNOSIS — Z7982 Long term (current) use of aspirin: Secondary | ICD-10-CM

## 2018-06-10 DIAGNOSIS — A4150 Gram-negative sepsis, unspecified: Secondary | ICD-10-CM | POA: Insufficient documentation

## 2018-06-10 DIAGNOSIS — R748 Abnormal levels of other serum enzymes: Secondary | ICD-10-CM | POA: Diagnosis present

## 2018-06-10 DIAGNOSIS — A4151 Sepsis due to Escherichia coli [E. coli]: Principal | ICD-10-CM | POA: Diagnosis present

## 2018-06-10 DIAGNOSIS — N390 Urinary tract infection, site not specified: Secondary | ICD-10-CM | POA: Diagnosis present

## 2018-06-10 DIAGNOSIS — M25561 Pain in right knee: Secondary | ICD-10-CM | POA: Diagnosis present

## 2018-06-10 DIAGNOSIS — Z833 Family history of diabetes mellitus: Secondary | ICD-10-CM

## 2018-06-10 DIAGNOSIS — R531 Weakness: Secondary | ICD-10-CM | POA: Diagnosis present

## 2018-06-10 DIAGNOSIS — I10 Essential (primary) hypertension: Secondary | ICD-10-CM | POA: Diagnosis present

## 2018-06-10 DIAGNOSIS — Z79899 Other long term (current) drug therapy: Secondary | ICD-10-CM

## 2018-06-10 DIAGNOSIS — E875 Hyperkalemia: Secondary | ICD-10-CM | POA: Diagnosis present

## 2018-06-10 DIAGNOSIS — D649 Anemia, unspecified: Secondary | ICD-10-CM | POA: Diagnosis present

## 2018-06-10 DIAGNOSIS — Z6841 Body Mass Index (BMI) 40.0 and over, adult: Secondary | ICD-10-CM | POA: Diagnosis not present

## 2018-06-10 DIAGNOSIS — W19XXXA Unspecified fall, initial encounter: Secondary | ICD-10-CM

## 2018-06-10 DIAGNOSIS — Z823 Family history of stroke: Secondary | ICD-10-CM | POA: Diagnosis not present

## 2018-06-10 DIAGNOSIS — M2550 Pain in unspecified joint: Secondary | ICD-10-CM

## 2018-06-10 DIAGNOSIS — E861 Hypovolemia: Secondary | ICD-10-CM | POA: Diagnosis present

## 2018-06-10 DIAGNOSIS — N1831 Chronic kidney disease, stage 3a: Secondary | ICD-10-CM

## 2018-06-10 DIAGNOSIS — M25541 Pain in joints of right hand: Secondary | ICD-10-CM | POA: Diagnosis present

## 2018-06-10 DIAGNOSIS — Z794 Long term (current) use of insulin: Secondary | ICD-10-CM | POA: Diagnosis not present

## 2018-06-10 DIAGNOSIS — I129 Hypertensive chronic kidney disease with stage 1 through stage 4 chronic kidney disease, or unspecified chronic kidney disease: Secondary | ICD-10-CM | POA: Diagnosis present

## 2018-06-10 DIAGNOSIS — E7800 Pure hypercholesterolemia, unspecified: Secondary | ICD-10-CM | POA: Diagnosis present

## 2018-06-10 DIAGNOSIS — E871 Hypo-osmolality and hyponatremia: Secondary | ICD-10-CM | POA: Diagnosis present

## 2018-06-10 DIAGNOSIS — M2540 Effusion, unspecified joint: Secondary | ICD-10-CM | POA: Diagnosis present

## 2018-06-10 HISTORY — DX: Heart failure, unspecified: I50.9

## 2018-06-10 LAB — CBC WITH DIFFERENTIAL/PLATELET
Abs Immature Granulocytes: 0.37 10*3/uL — ABNORMAL HIGH (ref 0.00–0.07)
Basophils Absolute: 0.1 10*3/uL (ref 0.0–0.1)
Basophils Relative: 0 %
Eosinophils Absolute: 0 10*3/uL (ref 0.0–0.5)
Eosinophils Relative: 0 %
HCT: 30 % — ABNORMAL LOW (ref 36.0–46.0)
Hemoglobin: 9.3 g/dL — ABNORMAL LOW (ref 12.0–15.0)
Immature Granulocytes: 3 %
Lymphocytes Relative: 9 %
Lymphs Abs: 1.1 10*3/uL (ref 0.7–4.0)
MCH: 26.6 pg (ref 26.0–34.0)
MCHC: 31 g/dL (ref 30.0–36.0)
MCV: 86 fL (ref 80.0–100.0)
Monocytes Absolute: 1.4 10*3/uL — ABNORMAL HIGH (ref 0.1–1.0)
Monocytes Relative: 11 %
Neutro Abs: 9.4 10*3/uL — ABNORMAL HIGH (ref 1.7–7.7)
Neutrophils Relative %: 77 %
Platelets: 391 10*3/uL (ref 150–400)
RBC: 3.49 MIL/uL — ABNORMAL LOW (ref 3.87–5.11)
RDW: 13.9 % (ref 11.5–15.5)
WBC: 12.3 10*3/uL — ABNORMAL HIGH (ref 4.0–10.5)
nRBC: 0 % (ref 0.0–0.2)

## 2018-06-10 LAB — URINALYSIS, ROUTINE W REFLEX MICROSCOPIC
Bilirubin Urine: NEGATIVE
Glucose, UA: >=500 mg/dL — AB
Hgb urine dipstick: NEGATIVE
Ketones, ur: NEGATIVE mg/dL
Nitrite: POSITIVE — AB
Protein, ur: NEGATIVE mg/dL
Specific Gravity, Urine: <1.005 — ABNORMAL LOW (ref 1.005–1.030)
pH: 6.5 (ref 5.0–8.0)

## 2018-06-10 LAB — BASIC METABOLIC PANEL
Anion gap: 11 (ref 5–15)
BUN: 12 mg/dL (ref 8–23)
CO2: 21 mmol/L — ABNORMAL LOW (ref 22–32)
Calcium: 8.7 mg/dL — ABNORMAL LOW (ref 8.9–10.3)
Chloride: 97 mmol/L — ABNORMAL LOW (ref 98–111)
Creatinine, Ser: 1.01 mg/dL — ABNORMAL HIGH (ref 0.44–1.00)
GFR calc Af Amer: 60 mL/min — ABNORMAL LOW (ref >60)
GFR calc non Af Amer: 51 mL/min — ABNORMAL LOW (ref >60)
Glucose, Bld: 369 mg/dL — ABNORMAL HIGH (ref 70–99)
Potassium: 5.5 mmol/L — ABNORMAL HIGH (ref 3.5–5.1)
Sodium: 129 mmol/L — ABNORMAL LOW (ref 135–145)

## 2018-06-10 LAB — FOLATE: Folate: 11.4 ng/mL (ref >5.9)

## 2018-06-10 LAB — RETICULOCYTES
Immature Retic Fract: 12.7 % (ref 2.3–15.9)
RBC.: 3.61 MIL/uL — ABNORMAL LOW (ref 3.87–5.11)
Retic Count, Absolute: 64.3 10*3/uL (ref 19.0–186.0)
Retic Ct Pct: 1.8 % (ref 0.4–3.1)

## 2018-06-10 LAB — HEPATIC FUNCTION PANEL
ALT: 22 U/L (ref 0–44)
AST: 21 U/L (ref 15–41)
Albumin: 2 g/dL — ABNORMAL LOW (ref 3.5–5.0)
Alkaline Phosphatase: 61 U/L (ref 38–126)
Bilirubin, Direct: 0.3 mg/dL — ABNORMAL HIGH (ref 0.0–0.2)
Indirect Bilirubin: 0.4 mg/dL (ref 0.3–0.9)
Total Bilirubin: 0.7 mg/dL (ref 0.3–1.2)
Total Protein: 6.4 g/dL — ABNORMAL LOW (ref 6.5–8.1)

## 2018-06-10 LAB — IRON AND TIBC
Iron: 12 ug/dL — ABNORMAL LOW (ref 28–170)
Saturation Ratios: 7 % — ABNORMAL LOW (ref 10.4–31.8)
TIBC: 162 ug/dL — ABNORMAL LOW (ref 250–450)
UIBC: 150 ug/dL

## 2018-06-10 LAB — VITAMIN B12: Vitamin B-12: 1112 pg/mL — ABNORMAL HIGH (ref 180–914)

## 2018-06-10 LAB — CBG MONITORING, ED: Glucose-Capillary: 333 mg/dL — ABNORMAL HIGH (ref 70–99)

## 2018-06-10 LAB — BRAIN NATRIURETIC PEPTIDE: B Natriuretic Peptide: 76.8 pg/mL (ref 0.0–100.0)

## 2018-06-10 LAB — URINALYSIS, MICROSCOPIC (REFLEX): RBC / HPF: NONE SEEN RBC/hpf (ref 0–5)

## 2018-06-10 LAB — TROPONIN I: Troponin I: <0.03 ng/mL (ref <0.03)

## 2018-06-10 LAB — POC OCCULT BLOOD, ED: Fecal Occult Bld: NEGATIVE

## 2018-06-10 LAB — SEDIMENTATION RATE: Sed Rate: 107 mm/hr — ABNORMAL HIGH (ref 0–22)

## 2018-06-10 LAB — FERRITIN: Ferritin: 654 ng/mL — ABNORMAL HIGH (ref 11–307)

## 2018-06-10 LAB — C-REACTIVE PROTEIN: CRP: 19.5 mg/dL — ABNORMAL HIGH (ref <1.0)

## 2018-06-10 MED ORDER — SODIUM CHLORIDE 0.9 % IV SOLN
1.0000 g | Freq: Once | INTRAVENOUS | Status: AC
  Administered 2018-06-10: 1 g via INTRAVENOUS
  Filled 2018-06-10: qty 10

## 2018-06-10 MED ORDER — LINAGLIPTIN 5 MG PO TABS
5.0000 mg | ORAL_TABLET | Freq: Every day | ORAL | Status: DC
  Administered 2018-06-10 – 2018-06-12 (×3): 5 mg via ORAL
  Filled 2018-06-10 (×3): qty 1

## 2018-06-10 MED ORDER — FUROSEMIDE 40 MG PO TABS
40.0000 mg | ORAL_TABLET | Freq: Every day | ORAL | Status: DC
  Administered 2018-06-11 – 2018-06-12 (×2): 40 mg via ORAL
  Filled 2018-06-10 (×3): qty 1

## 2018-06-10 MED ORDER — SODIUM CHLORIDE 0.9 % IV BOLUS
500.0000 mL | Freq: Once | INTRAVENOUS | Status: AC
  Administered 2018-06-10: 500 mL via INTRAVENOUS

## 2018-06-10 MED ORDER — SODIUM CHLORIDE 0.9 % IV SOLN
1.0000 g | INTRAVENOUS | Status: DC
  Administered 2018-06-11: 1 g via INTRAVENOUS
  Filled 2018-06-10 (×2): qty 10

## 2018-06-10 MED ORDER — ASPIRIN 81 MG PO CHEW
81.0000 mg | CHEWABLE_TABLET | Freq: Every day | ORAL | Status: DC
  Administered 2018-06-10 – 2018-06-11 (×2): 81 mg via ORAL
  Filled 2018-06-10 (×2): qty 1

## 2018-06-10 MED ORDER — ONDANSETRON HCL 4 MG/2ML IJ SOLN: 4.0000 mg | Freq: Four times a day (QID) | INTRAMUSCULAR | Status: DC | PRN

## 2018-06-10 MED ORDER — SODIUM CHLORIDE 0.9 % IV SOLN
INTRAVENOUS | Status: AC
  Administered 2018-06-10: 20:00:00 via INTRAVENOUS

## 2018-06-10 MED ORDER — INSULIN ASPART 100 UNIT/ML ~~LOC~~ SOLN
0.0000 [IU] | Freq: Three times a day (TID) | SUBCUTANEOUS | Status: DC
  Administered 2018-06-11: 15 [IU] via SUBCUTANEOUS

## 2018-06-10 MED ORDER — ONDANSETRON HCL 4 MG PO TABS: 4.0000 mg | ORAL_TABLET | Freq: Four times a day (QID) | ORAL | Status: DC | PRN

## 2018-06-10 MED ORDER — ENOXAPARIN SODIUM 40 MG/0.4ML ~~LOC~~ SOLN
40.0000 mg | SUBCUTANEOUS | Status: DC
  Administered 2018-06-10 – 2018-06-11 (×2): 40 mg via SUBCUTANEOUS
  Filled 2018-06-10 (×2): qty 0.4

## 2018-06-10 MED ORDER — CARVEDILOL 6.25 MG PO TABS
6.2500 mg | ORAL_TABLET | Freq: Two times a day (BID) | ORAL | Status: DC
  Administered 2018-06-10 – 2018-06-12 (×4): 6.25 mg via ORAL
  Filled 2018-06-10 (×4): qty 1

## 2018-06-10 MED ORDER — SODIUM CHLORIDE 0.9 % IV BOLUS: 1000.0000 mL | Freq: Once | INTRAVENOUS | Status: DC

## 2018-06-10 MED ORDER — FENTANYL CITRATE (PF) 100 MCG/2ML IJ SOLN
25.0000 ug | Freq: Once | INTRAMUSCULAR | Status: DC
  Filled 2018-06-10: qty 2

## 2018-06-10 MED ORDER — SODIUM CHLORIDE 0.9 % IV SOLN
Freq: Once | INTRAVENOUS | Status: AC
  Administered 2018-06-10: 17:00:00 via INTRAVENOUS

## 2018-06-10 NOTE — ED Notes (Signed)
Pt ambulated in hallway to bathroom O2 stayed above 95 the entire time. Pt normally uses cane, pt was slightly unsteady at the start.

## 2018-06-10 NOTE — H&P (Signed)
History and Physical    Helen Richardson VPC:[id number] DOB: January 14, 1934 DOA: 06/10/2018  PCP: Glendale Chard, MD   Patient coming from: Home.   I have personally briefly reviewed patient's old medical records in [place]  Chief Complaint: Weakness and fever.  HPI: Helen Richardson is a 85 y.o. female with medical history significant of unspecified type CHF, type 2 diabetes, hyperlipidemia, hypertension, morbid obesity who is brought to the emergency department due to progressively worse generalized weakness associated with an episode of fever, malaise, decreased oral intake edema of ankles, wrists and MCP joints, but minimal arthralgia since yesterday.  She also states that her blood glucose has been elevated.  She denies headache, sore throat, rhinorrhea, dried or productive cough, wheezing, dyspnea or hemoptysis.  No chest pain, palpitations, dizziness, diaphoresis, PND orthopnea.  She denies abdominal pain, nausea or emesis, diarrhea, constipation, melena or hematochezia.  No dysuria, frequency or hematuria.  She denies polyuria, polydipsia, polyphagia or blurred vision.  ED Course: Initial vital signs temperature 98.4 F, pulse 97, respirations 26, blood pressure 133/69 mmHg and O2 sat 98% on room air.  The patient received 500 mL of NS bolus and 1 g of ceftriaxone IVPB.  Urinalysis showed glucosuria more than 500 mg/dL, positive nitrites, positive leukocyte esterase, 6-10 WBC and many bacteria on microscopic examination.  Fecal occult blood was negative.  White count on CBC was 12.3 with 77% neutrophils, 9% lymphocytes and 11% monocytes.  Hemoglobin was 9.3 g/dL and platelets 391.  Troponin was normal.  BNP 76.8 pg/mL.  Sodium 129, potassium 5.5, chloride 97 and CO2 21 mmol/L with 90 and gap of 11.  BUN was 12, creatinine 1.01 and glucose 369 mg/dL.  LFTs show total protein of 6.4 and albumin of 2.0 g/dL.  Direct bilirubin slightly elevated at 0.3 mg/dL.  Transaminases are within normal  limits.  Review of Systems: As per HPI otherwise 10 point review of systems negative.   Past Medical History:  Diagnosis Date   CHF (congestive heart failure) (HCC)    Diabetes mellitus without complication (HCC)    High cholesterol    HTN (hypertension)    Obesity     History reviewed. No pertinent surgical history.   reports that she has never smoked. She has never used smokeless tobacco. She reports that she does not drink alcohol or use drugs.  No Known Allergies  Family History  Problem Relation Age of Onset   Diabetes Mother    Hypertension Mother    Hypertension Father    Stroke Father    Prior to Admission medications   Medication Sig Start Date End Date Taking? Authorizing Provider  ADVOCATE LANCETS MISC  01/04/13   [provider]  Alcohol Swabs (ALCOHOL PREP) 70 % PADS  02/09/13   [provider]  aspirin 81 MG chewable tablet Chew 81 mg by mouth at bedtime.    [provider]  Calcium Carbonate-Vitamin D (CALTRATE 600+D PO) Take 1 tablet by mouth daily.    [provider]  carvedilol (COREG) 6.25 MG tablet TAKE 1 TABLET BY MOUTH TWICE DAILY 05/17/18   Glendale Chard, MD  Cholecalciferol (VITAMIN D3 PO) Take 25 mcg by mouth daily. 1 per day    [provider]  COMFORT EZ PEN NEEDLES 32G X 4 MM MISC  08/21/17   [provider]  furosemide (LASIX) 40 MG tablet Take 40 mg by mouth daily.  02/18/13   [provider]  [place] (  75-25) 100 UNIT/ML Kwikpen ADMINISTER 20 UNITS UNDER THE SKIN BEFORE BREAKFAST AND DINNER. DO NOT EXCEED. MAX DOSE OF 50 UNITS A DAY 05/31/18   Glendale Chard, MD  Lancet Devices (ADVOCATE LANCING DEVICE) Deal  02/09/13   [provider]  polyethylene glycol powder (GLYCOLAX/MIRALAX) powder Mix 1 capful in drink and take by mouth 1 to 3 times daily as needed for daily soft stools  OTC 05/14/18   Little, Wenda Overland, MD  potassium chloride SA  (K-DUR,KLOR-CON) 20 MEQ tablet TAKE 1 TABLET BY MOUTH TWICE A DAY Patient taking differently: Take 20 mEq by mouth 2 (two) times daily.  01/19/18   Glendale Chard, MD  rosuvastatin (CRESTOR) 20 MG tablet TAKE 1 TABLET BY MOUTH EVERY DAY 05/17/18   Glendale Chard, MD  telmisartan (MICARDIS) 80 MG tablet TAKE 1 TABLET BY MOUTH ONCE DAILY 05/17/18   Glendale Chard, MD  TRADJENTA 5 MG TABS tablet TAKE 1 TABLET BY MOUTH ONCE DAILY Patient taking differently: Take 5 mg by mouth daily.  03/24/18   Glendale Chard, MD    Physical Exam: Vitals:   06/10/18 1400 06/10/18 1430 06/10/18 1500 06/10/18 1600  BP: 115/74 118/66 (!) 125/58 130/70  Pulse: 97 94 95 100  Resp: 19 20 (!) 24 14  Temp:      TempSrc:      SpO2: 100% 99% 99% 100%  Weight:      Height:        Constitutional: NAD, calm, comfortable Eyes: PERRL, lids and conjunctivae normal ENMT: Mucous membranes are mildly dry. Posterior pharynx clear of any exudate or lesions. Neck: normal, supple, no masses, no thyromegaly Respiratory: Clear to auscultation bilaterally, no wheezing, no crackles. Normal respiratory effort. No accessory muscle use.  Cardiovascular: Regular rate and rhythm, no murmurs / rubs / gallops. No extremity edema. 2+ pedal pulses. No carotid bruits.  Abdomen: Obese.  Soft, no tenderness, no masses palpated. No hepatosplenomegaly. Bowel sounds positive.  Musculoskeletal: no clubbing / cyanosis. Good ROM, no contractures. Normal muscle tone.  Skin: no rashes, lesions, ulcers on very limited dermatological examination. Neurologic: CN 2-12 grossly intact. Sensation intact, DTR normal. Strength 5/5 in all 4.  Psychiatric: Normal judgment and insight. Alert and oriented x 3. Normal mood.   Labs on Admission: I have personally reviewed following labs and imaging studies  CBC: Recent Labs  Lab 06/10/18 1403  WBC 12.3*  NEUTROABS 9.4*  HGB 9.3*  HCT 30.0*  MCV 86.0  PLT 349   Basic Metabolic Panel: Recent Labs  Lab  06/08/18 1439 06/10/18 1403  NA 132* 129*  K 5.3* 5.5*  CL 96 97*  CO2 21 21*  GLUCOSE 259* 369*  BUN 17 12  CREATININE 1.25* 1.01*  CALCIUM 9.1 8.7*   GFR: Estimated Creatinine Clearance: 49.2 mL/min (A) (by C-G formula based on SCr of 1.01 mg/dL (H)). Liver Function Tests: Recent Labs  Lab 06/10/18 1622  AST 21  ALT 22  ALKPHOS 61  BILITOT 0.7  PROT 6.4*  ALBUMIN 2.0*   No results for input(s): LIPASE, AMYLASE in the last 168 hours. No results for input(s): AMMONIA in the last 168 hours. Coagulation Profile: No results for input(s): INR, PROTIME in the last 168 hours. Cardiac Enzymes: Recent Labs  Lab 06/10/18 1403  TROPONINI <0.03   BNP (last 3 results) Recent Labs    05/21/18 1624  PROBNP 392   HbA1C: No results for input(s): HGBA1C in the last 72 hours. CBG: Recent Labs  Lab 06/10/18 1345  GLUCAP 333*   Lipid Profile: No results for input(s): CHOL, HDL, LDLCALC, TRIG, CHOLHDL, LDLDIRECT in the last 72 hours. Thyroid Function Tests: No results for input(s): TSH, T4TOTAL, FREET4, T3FREE, THYROIDAB in the last 72 hours. Anemia Panel: Recent Labs    06/10/18 1708  VITAMINB12 1,112*  FOLATE 11.4  FERRITIN 654*  TIBC 162*  IRON 12*  RETICCTPCT 1.8   Urine analysis:    Component Value Date/Time   COLORURINE YELLOW 06/10/2018 1419   APPEARANCEUR CLEAR 06/10/2018 1419   LABSPEC <1.005 (L) 06/10/2018 1419   PHURINE 6.5 06/10/2018 1419   GLUCOSEU >=500 (A) 06/10/2018 1419   HGBUR NEGATIVE 06/10/2018 1419   BILIRUBINUR NEGATIVE 06/10/2018 1419   KETONESUR NEGATIVE 06/10/2018 1419   PROTEINUR NEGATIVE 06/10/2018 1419   UROBILINOGEN 0.2 04/21/2009 1319   NITRITE POSITIVE (A) 06/10/2018 1419   LEUKOCYTESUR TRACE (A) 06/10/2018 1419    Radiological Exams on Admission: Dg Chest Port 1 View  Result Date: 06/10/2018 CLINICAL DATA:  Weakness. EXAM: PORTABLE CHEST 1 VIEW COMPARISON:  None. FINDINGS: Low lung volumes. Normal heart size. No  consolidation or edema. Osteopenia. IMPRESSION: Low lung volumes.  No active disease. Electronically Signed   By: Staci Righter M.D.   On: 06/10/2018 15:05    EKG: Independently reviewed.  Vent. rate 99 BPM PR interval * ms QRS duration 80 ms QT/QTc 359/461 ms P-R-T axes 72 -40 37 Sinus rhythm Left axis deviation Abnormal R-wave progression, early transition  Assessment/Plan Principal Problem:   Acute lower UTI (urinary tract infection) Admit to MedSurg/inpatient. Continue gentle and time-limited IV fluids. Continue ceftriaxone 1 g IVPB every 24 hours. Follow-up urine culture and sensitivity.  Active Problems:   Joint swelling Joints are nontender. Has elevated CRP and ESR. Check ANA and rheumatoid factor. May need to check complement studies.    Hyperkalemia Hold telmisartan and potassium supplementation.    Hyponatremia Likely due to furosemide use. Hold diuretic for now and continue time-limited NS hydration.    Normocytic anemia Monitor H&H. Follow-up anemia panel.    Type 2 diabetes mellitus with stage 3 chronic kidney disease, with long-term current use of insulin (HCC) Last hemoglobin A1c was 10.4% in January 2020. Carbohydrate modified diet. Continue Tradjenta 5 mg p.o. daily. CBG monitoring with regular insulin sliding scale.    Pure hypercholesterolemia Check CK. Hold statin for now.    HTN (hypertension) Continue carvedilol 6.25 mg p.o. twice daily. ARB and diuretic are on hold. Monitor BP, heart rate, renal function electrolytes.   DVT prophylaxis: Lovenox SQ. Code Status: Full code. Family Communication:  Disposition Plan: Admit for IV antibiotic therapy and further work-up. Consults called: Admission status: Inpatient/MedSurg.   Reubin Milan MD [facility]  06/10/2018, 6:38 PM   This document was prepared using Dragon voice recognition software and may contain some unintended transcription errors.

## 2018-06-10 NOTE — ED Provider Notes (Signed)
Assumed care from Dr. Charm Barges at 8:58 PM. Briefly, the patient is a 85 y.o. female with PMHx of  has a past medical history of CHF (congestive heart failure) (HCC), Diabetes mellitus without complication (HCC), High cholesterol, HTN (hypertension), and Obesity. here with generalized weakness.   Labs Reviewed  CBC WITH DIFFERENTIAL/PLATELET - Abnormal; Notable for the following components:      Result Value   WBC 12.3 (*)    RBC 3.49 (*)    Hemoglobin 9.3 (*)    HCT 30.0 (*)    Neutro Abs 9.4 (*)    Monocytes Absolute 1.4 (*)    Abs Immature Granulocytes 0.37 (*)    All other components within normal limits  BASIC METABOLIC PANEL - Abnormal; Notable for the following components:   Sodium 129 (*)    Potassium 5.5 (*)    Chloride 97 (*)    CO2 21 (*)    Glucose, Bld 369 (*)    Creatinine, Ser 1.01 (*)    Calcium 8.7 (*)    GFR calc non Af Amer 51 (*)    GFR calc Af Amer 60 (*)    All other components within normal limits  SEDIMENTATION RATE - Abnormal; Notable for the following components:   Sed Rate 107 (*)    All other components within normal limits  C-REACTIVE PROTEIN - Abnormal; Notable for the following components:   CRP 19.5 (*)    All other components within normal limits  URINALYSIS, ROUTINE W REFLEX MICROSCOPIC - Abnormal; Notable for the following components:   Specific Gravity, Urine <1.005 (*)    Glucose, UA >=500 (*)    Nitrite POSITIVE (*)    Leukocytes,Ua TRACE (*)    All other components within normal limits  URINALYSIS, MICROSCOPIC (REFLEX) - Abnormal; Notable for the following components:   Bacteria, UA MANY (*)    All other components within normal limits  HEPATIC FUNCTION PANEL - Abnormal; Notable for the following components:   Total Protein 6.4 (*)    Albumin 2.0 (*)    Bilirubin, Direct 0.3 (*)    All other components within normal limits  VITAMIN B12 - Abnormal; Notable for the following components:   Vitamin B-12 1,112 (*)    All other components  within normal limits  IRON AND TIBC - Abnormal; Notable for the following components:   Iron 12 (*)    TIBC 162 (*)    Saturation Ratios 7 (*)    All other components within normal limits  FERRITIN - Abnormal; Notable for the following components:   Ferritin 654 (*)    All other components within normal limits  RETICULOCYTES - Abnormal; Notable for the following components:   RBC. 3.61 (*)    All other components within normal limits  CBG MONITORING, ED - Abnormal; Notable for the following components:   Glucose-Capillary 333 (*)    All other components within normal limits  URINE CULTURE  TROPONIN I  BRAIN NATRIURETIC PEPTIDE  FOLATE  RHEUMATOID FACTOR  ANA  CBC WITH DIFFERENTIAL/PLATELET  COMPREHENSIVE METABOLIC PANEL  POC OCCULT BLOOD, ED    Course of Care: -Labs reviewed by myself, concerning for UTI with systemic inflammatory markers c/f underlying arthritis. Of note, I see petechiae on exam. No signs of hemolysis on LFTs but given worsening anemia, elevated inflam markers c/f autoimmune condition with UTI and gen weakness, will admit.     Shaune Pollack, MD 06/10/18 2059

## 2018-06-10 NOTE — Telephone Encounter (Signed)
I tried to call the pt's grandson Helen Richardson back to let him know that Dr. Allyne Gee said to go ahead and call the EMS because the pt is having some swelling.

## 2018-06-10 NOTE — ED Notes (Signed)
CBG 333. °

## 2018-06-10 NOTE — ED Triage Notes (Signed)
Pt from home with ems for generalized weakness and joint pain since last night. CBG 400s, rr36. Pt cries out in pain when touched. Increased edema in bilateral hands. Pt alert upon arrival to ED, VSS.

## 2018-06-10 NOTE — ED Notes (Signed)
ED TO INPATIENT HANDOFF REPORT  ED Nurse Name and Phone #: Trebor Galdamez [phone]  S Name/Age/Gender Helen Richardson 85 y.o. female Room/Bed: 020C/020C  Code Status   Code Status: Not on file  Home/SNF/Other Home Patient oriented to: self, place, time and situation Is this baseline? Yes   Triage Complete: Triage complete  Chief Complaint Gen Weakness  Triage Note Pt from home with ems for generalized weakness and joint pain since last night. CBG 400s, rr36. Pt cries out in pain when touched. Increased edema in bilateral hands. Pt alert upon arrival to ED, VSS.    Allergies No Known Allergies  Level of Care/Admitting Diagnosis ED Disposition    ED Disposition Condition Comment   Admit  Hospital Area: [facility] [[id number]]  Level of Care: Med-Surg [16]  Diagnosis: Acute lower UTI (urinary tract infection) [454098]  Admitting Physician: Bobette Mo [[id number]]  Attending Physician: Bobette Mo [[id number]]  Estimated length of stay: past midnight tomorrow  Certification:: I certify this patient will need inpatient services for at least 2 midnights  PT Class (Do Not Modify): Inpatient [101]  PT Acc Code (Do Not Modify): Private [1]       B Medical/Surgery History Past Medical History:  Diagnosis Date  . CHF (congestive heart failure) (HCC)   . Diabetes mellitus without complication (HCC)   . High cholesterol   . HTN (hypertension)   . Obesity    History reviewed. No pertinent surgical history.   A IV Location/Drains/Wounds Patient Lines/Drains/Airways Status   Active Line/Drains/Airways    Name:   Placement date:   Placement time:   Site:   Days:   Peripheral IV 06/10/18 Left Forearm   06/10/18    1340    Forearm   less than 1   External Urinary Catheter   06/10/18    1420    -   less than 1          Intake/Output Last 24 hours No intake or output data in the 24 hours ending 06/10/18 1755  Labs/Imaging Results for orders placed  or performed during the hospital encounter of 06/10/18 (from the past 48 hour(s))  CBG monitoring, ED     Status: Abnormal   Collection Time: 06/10/18  1:45 PM  Result Value Ref Range   Glucose-Capillary 333 (H) 70 - 99 mg/dL   Comment 1 Notify RN    Comment 2 Document in Chart   CBC with Differential     Status: Abnormal   Collection Time: 06/10/18  2:03 PM  Result Value Ref Range   WBC 12.3 (H) 4.0 - 10.5 K/uL   RBC 3.49 (L) 3.87 - 5.11 MIL/uL   Hemoglobin 9.3 (L) 12.0 - 15.0 g/dL   HCT 08.6 (L) 57.8 - 46.9 %   MCV 86.0 80.0 - 100.0 fL   MCH 26.6 26.0 - 34.0 pg   MCHC 31.0 30.0 - 36.0 g/dL   RDW 62.9 52.8 - 41.3 %   Platelets 391 150 - 400 K/uL   nRBC 0.0 0.0 - 0.2 %   Neutrophils Relative % 77 %   Neutro Abs 9.4 (H) 1.7 - 7.7 K/uL   Lymphocytes Relative 9 %   Lymphs Abs 1.1 0.7 - 4.0 K/uL   Monocytes Relative 11 %   Monocytes Absolute 1.4 (H) 0.1 - 1.0 K/uL   Eosinophils Relative 0 %   Eosinophils Absolute 0.0 0.0 - 0.5 K/uL   Basophils Relative 0 %   Basophils Absolute  0.1 0.0 - 0.1 K/uL   Immature Granulocytes 3 %   Abs Immature Granulocytes 0.37 (H) 0.00 - 0.07 K/uL    Comment: Performed at [facility], [address], [place], [place] [address]  Basic metabolic panel     Status: Abnormal   Collection Time: 06/10/18  2:03 PM  Result Value Ref Range   Sodium 129 (L) 135 - 145 mmol/L   Potassium 5.5 (H) 3.5 - 5.1 mmol/L   Chloride 97 (L) 98 - 111 mmol/L   CO2 21 (L) 22 - 32 mmol/L   Glucose, Bld 369 (H) 70 - 99 mg/dL   BUN 12 8 - 23 mg/dL   Creatinine, Ser 7.12 (H) 0.44 - 1.00 mg/dL   Calcium 8.7 (L) 8.9 - 10.3 mg/dL   GFR calc non Af Amer 51 (L) >60 mL/min   GFR calc Af Amer 60 (L) >60 mL/min   Anion gap 11 5 - 15    Comment: Performed at [facility], [address], [place], [place] [address]  Sedimentation rate     Status: Abnormal   Collection Time: 06/10/18  2:03 PM  Result Value Ref Range   Sed Rate 107 (H) 0 - 22 mm/hr    Comment: Performed  at [facility], [address] [place], [place] [address]  C-reactive protein     Status: Abnormal   Collection Time: 06/10/18  2:03 PM  Result Value Ref Range   CRP 19.5 (H) <1.0 mg/dL    Comment: Performed at [facility], [address] [place], [place] [address]  Troponin I - Once     Status: None   Collection Time: 06/10/18  2:03 PM  Result Value Ref Range   Troponin I <0.03 <0.03 ng/mL    Comment: Performed at [facility], [address], [place], [place] [address]  Brain natriuretic peptide     Status: None   Collection Time: 06/10/18  2:03 PM  Result Value Ref Range   B Natriuretic Peptide 76.8 0.0 - 100.0 pg/mL    Comment: Performed at [facility], [address], [place], [place] [address]  Urinalysis, Routine w reflex microscopic     Status: Abnormal   Collection Time: 06/10/18  2:19 PM  Result Value Ref Range   Color, Urine YELLOW YELLOW   APPearance CLEAR CLEAR   Specific Gravity, Urine <1.005 (L) 1.005 - 1.030   pH 6.5 5.0 - 8.0   Glucose, UA >=500 (A) NEGATIVE mg/dL   Hgb urine dipstick NEGATIVE NEGATIVE   Bilirubin Urine NEGATIVE NEGATIVE   Ketones, ur NEGATIVE NEGATIVE mg/dL   Protein, ur NEGATIVE NEGATIVE mg/dL   Nitrite POSITIVE (A) NEGATIVE   Leukocytes,Ua TRACE (A) NEGATIVE    Comment: Performed at [facility], [address] [place], [place] [address]  Urinalysis, Microscopic (reflex)     Status: Abnormal   Collection Time: 06/10/18  2:19 PM  Result Value Ref Range   RBC / HPF NONE SEEN 0 - 5 RBC/hpf   WBC, UA 6-10 0 - 5 WBC/hpf   Bacteria, UA MANY (A) NONE SEEN   Squamous Epithelial / LPF 0-5 0 - 5    Comment: Performed at [facility], [address], [place], [place] [address]  POC occult blood, ED Provider will collect     Status: None   Collection Time: 06/10/18  3:31 PM  Result Value Ref Range   Fecal  Occult Bld NEGATIVE NEGATIVE  Hepatic function panel     Status: Abnormal   Collection Time: 06/10/18   4:22 PM  Result Value Ref Range   Total Protein 6.4 (L) 6.5 - 8.1 g/dL   Albumin 2.0 (L) 3.5 - 5.0 g/dL   AST 21 15 - 41 U/L   ALT 22 0 - 44 U/L   Alkaline Phosphatase 61 38 - 126 U/L   Total Bilirubin 0.7 0.3 - 1.2 mg/dL   Bilirubin, Direct 0.3 (H) 0.0 - 0.2 mg/dL   Indirect Bilirubin 0.4 0.3 - 0.9 mg/dL    Comment: Performed at [facility], [address] [place], [place] [address]  Reticulocytes     Status: Abnormal   Collection Time: 06/10/18  5:08 PM  Result Value Ref Range   Retic Ct Pct 1.8 0.4 - 3.1 %   RBC. 3.61 (L) 3.87 - 5.11 MIL/uL   Retic Count, Absolute 64.3 19.0 - 186.0 K/uL   Immature Retic Fract 12.7 2.3 - 15.9 %    Comment: Performed at [facility], [address] [place], [place] [address]   Dg Chest Port 1 View  Result Date: 06/10/2018 CLINICAL DATA:  Weakness. EXAM: PORTABLE CHEST 1 VIEW COMPARISON:  None. FINDINGS: Low lung volumes. Normal heart size. No consolidation or edema. Osteopenia. IMPRESSION: Low lung volumes.  No active disease. Electronically Signed   By: Elsie StainJohn T Curnes M.D.   On: 06/10/2018 15:05    Pending Labs Unresulted Labs (From admission, onward)    Start     Ordered   06/10/18 1753  ANA  Add-on,   R     06/10/18 1752   06/10/18 1752  Rheumatoid factor  Add-on,   R     06/10/18 1752   06/10/18 1633  Vitamin B12  (Anemia Panel (PNL))  ONCE - STAT,   STAT     06/10/18 1632   06/10/18 1633  Folate  (Anemia Panel (PNL))  ONCE - STAT,   STAT     06/10/18 1632   06/10/18 1633  Iron and TIBC  (Anemia Panel (PNL))  ONCE - STAT,   STAT     06/10/18 1632   06/10/18 1633  Ferritin  (Anemia Panel (PNL))  ONCE - STAT,   STAT     06/10/18 1632   06/10/18 1620  Urine culture  ONCE - STAT,   STAT    Question:  Patient immune status  Answer:  Normal   06/10/18 1619          Vitals/Pain Today's Vitals   06/10/18 1400 06/10/18 1430 06/10/18 1500 06/10/18 1600  BP: 115/74 118/66 (!) 125/58 130/70  Pulse: 97 94 95 100  Resp: 19 20  (!) 24 14  Temp:      TempSrc:      SpO2: 100% 99% 99% 100%  Weight:      Height:        Isolation Precautions No active isolations  Medications Medications  cefTRIAXone (ROCEPHIN) 1 g in sodium chloride 0.9 % 100 mL IVPB (has no administration in time range)  sodium chloride 0.9 % bolus 500 mL (500 mLs Intravenous Bolus from Bag 06/10/18 1714)  cefTRIAXone (ROCEPHIN) 1 g in sodium chloride 0.9 % 100 mL IVPB (0 g Intravenous Stopped 06/10/18 1750)  0.9 %  sodium chloride infusion ( Intravenous New Bag/Given 06/10/18 1713)    Mobility walks with device High fall risk   Focused Assessments Neuro Assessment Handoff:  Swallow  screen pass? not needed         Neuro Assessment: Exceptions to WDL Neuro Checks:      Last Documented NIHSS Modified Score:   Has TPA been given? No If patient is a Neuro Trauma and patient is going to OR before floor call report to 4N Charge nurse: [phone] or [phone]     R Recommendations: See Admitting Provider Note  Report given to:   Additional Notes:

## 2018-06-10 NOTE — ED Provider Notes (Signed)
[place] EMERGENCY DEPARTMENT Provider Note   CSN: [id number] Arrival date & time:       History   Chief Complaint Chief Complaint  Patient presents with  . Weakness  . Joint Pain    HPI Helen Richardson is a 85 y.o. female.  She is a very poor historian.  She was brought in by ambulance after her son called them for generalized weakness and pain and swelling in her hands and knees.  Patient denies any trauma.  She said she has been taking her medications regular.  She denies chest pain shortness of breath abdominal pain or fevers.  She denies having had this before.     The history is provided by the patient.  Weakness  Severity:  Unable to specify Onset quality:  Gradual Duration:  2 days Timing:  Constant Progression:  Unchanged Chronicity:  New Relieved by:  None tried Worsened by:  Nothing Ineffective treatments:  None tried Associated symptoms: arthralgias   Associated symptoms: no abdominal pain, no chest pain, no cough, no diarrhea, no dysuria, no fever, no foul-smelling urine, no shortness of breath and no stroke symptoms   Risk factors: congestive heart failure and diabetes     Past Medical History:  Diagnosis Date  . CHF (congestive heart failure) ([place])   . Diabetes mellitus without complication ([place])   . High cholesterol   . HTN (hypertension)   . Obesity     Patient Active Problem List   Diagnosis Date Noted  . Type 2 diabetes mellitus with stage 3 chronic kidney disease, with long-term current use of insulin ([place]) 03/08/2018  . 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 Pure hypercholesterolemia 03/08/2018  . Class 3 severe obesity due to excess calories with serious comorbidity and body mass index (BMI) of 40.0 to 44.9 in adult [facility]) 03/08/2018    History reviewed. No pertinent surgical history.   OB History   No obstetric history on file.      Home Medications    Prior to Admission medications   Medication Sig  Start Date End Date Taking? Authorizing Provider  ADVOCATE LANCETS MISC  01/04/13   [provider]  Alcohol Swabs (ALCOHOL PREP) 70 % PADS  02/09/13   [provider]  aspirin 81 MG chewable tablet Chew 81 mg by mouth at bedtime.    [provider]  Calcium Carbonate-Vitamin D (CALTRATE 600+D PO) Take 1 tablet by mouth daily.    [provider]  carvedilol (COREG) 6.25 MG tablet TAKE 1 TABLET BY MOUTH TWICE DAILY 05/17/18   Glendale Chard, MD  Cholecalciferol (VITAMIN D3 PO) Take 25 mcg by mouth daily. 1 per day    [provider]  COMFORT EZ PEN NEEDLES 32G X 4 MM MISC  08/21/17   [provider]  furosemide (LASIX) 40 MG tablet Take 40 mg by mouth daily.  02/18/13   [provider]  HUMALOG MIX 75/25 KWIKPEN (75-25) 100 UNIT/ML Kwikpen ADMINISTER 20 UNITS UNDER THE SKIN BEFORE BREAKFAST AND DINNER. DO NOT EXCEED. MAX DOSE OF 50 UNITS A DAY 05/31/18   Glendale Chard, MD  Lancet Devices (ADVOCATE LANCING DEVICE) [place]  02/09/13   [provider]  polyethylene glycol powder (GLYCOLAX/MIRALAX) powder Mix 1 capful in drink and take by mouth 1 to 3 times daily as needed for daily soft stools  OTC 05/14/18   Little, Wenda Overland, MD  potassium chloride SA (K-DUR,KLOR-CON) 20 MEQ tablet TAKE 1 TABLET BY MOUTH TWICE  A DAY Patient taking differently: Take 20 mEq by mouth 2 (two) times daily.  01/19/18   Glendale Chard, MD  rosuvastatin (CRESTOR) 20 MG tablet TAKE 1 TABLET BY MOUTH EVERY DAY 05/17/18   Glendale Chard, MD  telmisartan (MICARDIS) 80 MG tablet TAKE 1 TABLET BY MOUTH ONCE DAILY 05/17/18   Glendale Chard, MD  TRADJENTA 5 MG TABS tablet TAKE 1 TABLET BY MOUTH ONCE DAILY Patient taking differently: Take 5 mg by mouth daily.  03/24/18   Glendale Chard, MD    Family History Family History  Problem Relation Age of Onset  . Diabetes Mother   . Hypertension Mother   . Hypertension Father   . Stroke Father     Social History  Social History   Tobacco Use  . Smoking status: Never Smoker  . Smokeless tobacco: Never Used  Substance Use Topics  . Alcohol use: No  . Drug use: No     Allergies   Patient has no known allergies.   Review of Systems Review of Systems  Constitutional: Negative for fever.  HENT: Negative for sore throat.   Eyes: Negative for visual disturbance.  Respiratory: Negative for cough and shortness of breath.   Cardiovascular: Positive for leg swelling. Negative for chest pain.  Gastrointestinal: Negative for abdominal pain and diarrhea.  Genitourinary: Negative for dysuria.  Musculoskeletal: Positive for arthralgias.  Skin: Negative for rash.  Neurological: Positive for weakness.     Physical Exam Updated Vital Signs BP 133/69 (BP Location: Right Wrist)   Pulse 97   Temp 98.4 F (36.9 C) (Oral)   Resp (!) 26   Ht _0  (1.6 m)   Wt 106 kg   SpO2 100%   BMI 41.40 kg/m   Physical Exam Vitals signs and nursing note reviewed.  Constitutional:      General: She is not in acute distress.    Appearance: She is well-developed.  HENT:     Head: Normocephalic and atraumatic.  Eyes:     Conjunctiva/sclera: Conjunctivae normal.  Neck:     Musculoskeletal: Neck supple.  Cardiovascular:     Rate and Rhythm: Normal rate and regular rhythm.     Heart sounds: No murmur.  Pulmonary:     Effort: Pulmonary effort is normal. Tachypnea present. No respiratory distress.     Breath sounds: Normal breath sounds.  Abdominal:     Palpations: Abdomen is soft.     Tenderness: There is no abdominal tenderness.  Musculoskeletal: Normal range of motion.     Right lower leg: Edema present.     Left lower leg: Edema present.     Comments: She has some diffuse swelling on the dorsum of her hands.  No particular joint redness or warmth.  She has 1-2+ pitting edema of her lower extremities.  No obvious cords.  Legs are symmetric.  Skin:    General: Skin is warm and dry.     Capillary  Refill: Capillary refill takes less than 2 seconds.  Neurological:     General: No focal deficit present.     Mental Status: She is alert.      ED Treatments / Results  Labs (all labs ordered are listed, but only abnormal results are displayed) Labs Reviewed  CBC WITH DIFFERENTIAL/PLATELET - Abnormal; Notable for the following components:      Result Value   WBC 12.3 (*)    RBC 3.49 (*)    Hemoglobin 9.3 (*)    HCT 30.0 (*)  Neutro Abs 9.4 (*)    Monocytes Absolute 1.4 (*)    Abs Immature Granulocytes 0.37 (*)    All other components within normal limits  BASIC METABOLIC PANEL - Abnormal; Notable for the following components:   Sodium 129 (*)    Potassium 5.5 (*)    Chloride 97 (*)    CO2 21 (*)    Glucose, Bld 369 (*)    Creatinine, Ser 1.01 (*)    Calcium 8.7 (*)    GFR calc non Af Amer 51 (*)    GFR calc Af Amer 60 (*)    All other components within normal limits  SEDIMENTATION RATE - Abnormal; Notable for the following components:   Sed Rate 107 (*)    All other components within normal limits  C-REACTIVE PROTEIN - Abnormal; Notable for the following components:   CRP 19.5 (*)    All other components within normal limits  URINALYSIS, ROUTINE W REFLEX MICROSCOPIC - Abnormal; Notable for the following components:   Specific Gravity, Urine <1.005 (*)    Glucose, UA >=500 (*)    Nitrite POSITIVE (*)    Leukocytes,Ua TRACE (*)    All other components within normal limits  URINALYSIS, MICROSCOPIC (REFLEX) - Abnormal; Notable for the following components:   Bacteria, UA MANY (*)    All other components within normal limits  HEPATIC FUNCTION PANEL - Abnormal; Notable for the following components:   Total Protein 6.4 (*)    Albumin 2.0 (*)    Bilirubin, Direct 0.3 (*)    All other components within normal limits  VITAMIN B12 - Abnormal; Notable for the following components:   Vitamin B-12 1,112 (*)    All other components within normal limits  IRON AND TIBC -  Abnormal; Notable for the following components:   Iron 12 (*)    TIBC 162 (*)    Saturation Ratios 7 (*)    All other components within normal limits  FERRITIN - Abnormal; Notable for the following components:   Ferritin 654 (*)    All other components within normal limits  RETICULOCYTES - Abnormal; Notable for the following components:   RBC. 3.61 (*)    All other components within normal limits  CBG MONITORING, ED - Abnormal; Notable for the following components:   Glucose-Capillary 333 (*)    All other components within normal limits  URINE CULTURE  TROPONIN I  BRAIN NATRIURETIC PEPTIDE  FOLATE  RHEUMATOID FACTOR  ANA  CBC WITH DIFFERENTIAL/PLATELET  COMPREHENSIVE METABOLIC PANEL  POC OCCULT BLOOD, ED    EKG EKG Interpretation  Date/Time:  Thursday June 10 2018 13:45:35 EDT Ventricular Rate:  99 PR Interval:    QRS Duration: 80 QT Interval:  359 QTC Calculation: 461 R Axis:   -40 Text Interpretation:  Sinus rhythm Left axis deviation Abnormal R-wave progression, early transition Poor data quality Confirmed by Aletta Edouard [phone]) on 06/10/2018 2:03:00 PM Also confirmed by Aletta Edouard [phone]), editor Philomena Doheny [phone])  on 06/10/2018 3:35:41 PM   Radiology Dg Chest Port 1 View  Result Date: 06/10/2018 CLINICAL DATA:  Weakness. EXAM: PORTABLE CHEST 1 VIEW COMPARISON:  None. FINDINGS: Low lung volumes. Normal heart size. No consolidation or edema. Osteopenia. IMPRESSION: Low lung volumes.  No active disease. Electronically Signed   By: Staci Righter M.D.   On: 06/10/2018 15:05    Procedures Procedures (including critical care time)  Medications Ordered in ED Medications  cefTRIAXone (ROCEPHIN) 1 g in sodium chloride 0.9 % 100  mL IVPB (has no administration in time range)  enoxaparin (LOVENOX) injection 40 mg (has no administration in time range)  ondansetron (ZOFRAN) tablet 4 mg (has no administration in time range)    Or  ondansetron (ZOFRAN) injection 4 mg  (has no administration in time range)  sodium chloride 0.9 % bolus 500 mL (500 mLs Intravenous Bolus from Bag 06/10/18 1714)  cefTRIAXone (ROCEPHIN) 1 g in sodium chloride 0.9 % 100 mL IVPB (0 g Intravenous Stopped 06/10/18 1750)  0.9 %  sodium chloride infusion ( Intravenous New Bag/Given 06/10/18 1713)     Initial Impression / Assessment and Plan / ED Course  I have reviewed the triage vital signs and the nursing notes.  Pertinent labs & imaging results that were available during my care of the patient were reviewed by me and considered in my medical decision making (see chart for details).  Clinical Course as of Jun 10 1531  Thu Jun 10, 2018  1530 Rectal exam done, soft stool in vault.  Sent to lab for guaiac.   [MB]  1530 Differential includes infection including Covid, pneumonia, UTI, inflammatory arthritis, PMR, CHF, ACS.  Labs coming back and show slight increase in white blood cell count nonspecific.  She is also newly anemic with a hemoglobin of 9.3.   [MB]  1531   Rectal exam done and guaiac pending.  Urine shows nitrite positive although just 6-10 whites many bacteria.   [MB]    Clinical Course User Index [MB] Hayden Rasmussen, MD   Helen Richardson was evaluated in Emergency Department on 06/10/2018 for the symptoms described in the history of present illness. She was evaluated in the context of the global COVID-19 pandemic, which necessitated consideration that the patient might be at risk for infection with the SARS-CoV-2 virus that causes COVID-19. Institutional protocols and algorithms that pertain to the evaluation of patients at risk for COVID-19 are in a state of rapid change based on information released by regulatory bodies including the CDC and federal and state organizations. These policies and algorithms were followed during the patient's care in the ED.      Final Clinical Impressions(s) / ED Diagnoses   Final diagnoses:  Arthralgia, unspecified joint  Weakness  ESR  raised    ED Discharge Orders    None       Hayden Rasmussen, MD 06/10/18 [phone]

## 2018-06-10 NOTE — Chronic Care Management (AMB) (Signed)
  Chronic Care Management   Outreach Note  06/10/2018 Name: LITITIA POSA MRN: [id number] DOB: 1933/04/05  Referred by: Dorothyann Peng, MD Reason for referral : Chronic Care Management (CCM RN Telephone outreach attempt to discuss disease management and education for DM scheduled with grandson Ahria Meno)  An unsuccessful telephone outreach was attempted today to patient's grandson Dellia Runck, no answer and unable to leave a voice message. The patient was referred to the case management team by Dorothyann Peng MD for assistance with uncontrolled Diabetes Mellitus, s/p fall, Parenchymal renal hypertension, medication management and care coordination.   Upon review of patient's case in preparation to contact grandson [name], noted patient was admitted to [facility] today, dx: weakness and joint pain.  Follow Up Plan: The CCM team will monitor for patients discharge. The CCM team will f/u with patient and grandson Almitra Mere upon patient's d/c home.    Delsa [place], RN,CCM Care Management Coordinator [facility] Care Management/[facility]  Direct Phone: [phone]

## 2018-06-10 NOTE — ED Notes (Signed)
GrandsonSelva Nooner: [phone] would like to be called when there is an update

## 2018-06-11 ENCOUNTER — Telehealth: Payer: Medicare Other

## 2018-06-11 DIAGNOSIS — Z794 Long term (current) use of insulin: Secondary | ICD-10-CM

## 2018-06-11 DIAGNOSIS — N390 Urinary tract infection, site not specified: Secondary | ICD-10-CM | POA: Insufficient documentation

## 2018-06-11 DIAGNOSIS — E875 Hyperkalemia: Secondary | ICD-10-CM

## 2018-06-11 DIAGNOSIS — I10 Essential (primary) hypertension: Secondary | ICD-10-CM

## 2018-06-11 DIAGNOSIS — E1122 Type 2 diabetes mellitus with diabetic chronic kidney disease: Secondary | ICD-10-CM

## 2018-06-11 DIAGNOSIS — D649 Anemia, unspecified: Secondary | ICD-10-CM

## 2018-06-11 DIAGNOSIS — E871 Hypo-osmolality and hyponatremia: Secondary | ICD-10-CM

## 2018-06-11 DIAGNOSIS — N183 Chronic kidney disease, stage 3 (moderate): Secondary | ICD-10-CM

## 2018-06-11 DIAGNOSIS — N1831 Chronic kidney disease, stage 3a: Secondary | ICD-10-CM

## 2018-06-11 DIAGNOSIS — R700 Elevated erythrocyte sedimentation rate: Secondary | ICD-10-CM

## 2018-06-11 DIAGNOSIS — N179 Acute kidney failure, unspecified: Secondary | ICD-10-CM

## 2018-06-11 DIAGNOSIS — A4150 Gram-negative sepsis, unspecified: Secondary | ICD-10-CM | POA: Insufficient documentation

## 2018-06-11 LAB — CBC WITH DIFFERENTIAL/PLATELET
Abs Immature Granulocytes: 0.26 10*3/uL — ABNORMAL HIGH (ref 0.00–0.07)
Basophils Absolute: 0.1 10*3/uL (ref 0.0–0.1)
Basophils Relative: 1 %
Eosinophils Absolute: 0 10*3/uL (ref 0.0–0.5)
Eosinophils Relative: 0 %
HCT: 29 % — ABNORMAL LOW (ref 36.0–46.0)
Hemoglobin: 9.5 g/dL — ABNORMAL LOW (ref 12.0–15.0)
Immature Granulocytes: 2 %
Lymphocytes Relative: 11 %
Lymphs Abs: 1.4 10*3/uL (ref 0.7–4.0)
MCH: 28.3 pg (ref 26.0–34.0)
MCHC: 32.8 g/dL (ref 30.0–36.0)
MCV: 86.3 fL (ref 80.0–100.0)
Monocytes Absolute: 1.3 10*3/uL — ABNORMAL HIGH (ref 0.1–1.0)
Monocytes Relative: 11 %
Neutro Abs: 8.8 10*3/uL — ABNORMAL HIGH (ref 1.7–7.7)
Neutrophils Relative %: 75 %
Platelets: 368 10*3/uL (ref 150–400)
RBC: 3.36 MIL/uL — ABNORMAL LOW (ref 3.87–5.11)
RDW: 14 % (ref 11.5–15.5)
WBC: 11.8 10*3/uL — ABNORMAL HIGH (ref 4.0–10.5)
nRBC: 0 % (ref 0.0–0.2)

## 2018-06-11 LAB — GLUCOSE, CAPILLARY
Glucose-Capillary: 321 mg/dL — ABNORMAL HIGH (ref 70–99)
Glucose-Capillary: 265 mg/dL — ABNORMAL HIGH (ref 70–99)
Glucose-Capillary: 194 mg/dL — ABNORMAL HIGH (ref 70–99)
Glucose-Capillary: 115 mg/dL — ABNORMAL HIGH (ref 70–99)

## 2018-06-11 LAB — COMPREHENSIVE METABOLIC PANEL
ALT: 20 U/L (ref 0–44)
AST: 18 U/L (ref 15–41)
Albumin: 2 g/dL — ABNORMAL LOW (ref 3.5–5.0)
Alkaline Phosphatase: 67 U/L (ref 38–126)
Anion gap: 9 (ref 5–15)
BUN: 9 mg/dL (ref 8–23)
CO2: 25 mmol/L (ref 22–32)
Calcium: 8.7 mg/dL — ABNORMAL LOW (ref 8.9–10.3)
Chloride: 100 mmol/L (ref 98–111)
Creatinine, Ser: 0.85 mg/dL (ref 0.44–1.00)
GFR calc Af Amer: >60 mL/min (ref >60)
GFR calc non Af Amer: >60 mL/min (ref >60)
Glucose, Bld: 333 mg/dL — ABNORMAL HIGH (ref 70–99)
Potassium: 4.5 mmol/L (ref 3.5–5.1)
Sodium: 134 mmol/L — ABNORMAL LOW (ref 135–145)
Total Bilirubin: 0.5 mg/dL (ref 0.3–1.2)
Total Protein: 6.3 g/dL — ABNORMAL LOW (ref 6.5–8.1)

## 2018-06-11 LAB — HEMOGLOBIN A1C
Hgb A1c MFr Bld: 12.1 % — ABNORMAL HIGH (ref 4.8–5.6)
Mean Plasma Glucose: 300.57 mg/dL

## 2018-06-11 MED ORDER — INSULIN ASPART 100 UNIT/ML ~~LOC~~ SOLN
4.0000 [IU] | Freq: Three times a day (TID) | SUBCUTANEOUS | Status: DC
  Administered 2018-06-11 – 2018-06-12 (×4): 4 [IU] via SUBCUTANEOUS

## 2018-06-11 MED ORDER — ACETAMINOPHEN 325 MG PO TABS: 650.0000 mg | ORAL_TABLET | Freq: Four times a day (QID) | ORAL | Status: DC | PRN

## 2018-06-11 MED ORDER — INSULIN DETEMIR 100 UNIT/ML ~~LOC~~ SOLN
10.0000 [IU] | Freq: Two times a day (BID) | SUBCUTANEOUS | Status: DC
  Administered 2018-06-11 – 2018-06-12 (×3): 10 [IU] via SUBCUTANEOUS
  Filled 2018-06-11 (×5): qty 0.1

## 2018-06-11 MED ORDER — INSULIN ASPART 100 UNIT/ML ~~LOC~~ SOLN: 0.0000 [IU] | Freq: Every day | SUBCUTANEOUS | Status: DC

## 2018-06-11 MED ORDER — INSULIN ASPART 100 UNIT/ML ~~LOC~~ SOLN
0.0000 [IU] | Freq: Three times a day (TID) | SUBCUTANEOUS | Status: DC
  Administered 2018-06-11: 3 [IU] via SUBCUTANEOUS
  Administered 2018-06-11: 8 [IU] via SUBCUTANEOUS
  Administered 2018-06-12: 5 [IU] via SUBCUTANEOUS

## 2018-06-11 MED ORDER — HYDROCODONE-ACETAMINOPHEN 5-325 MG PO TABS
1.0000 | ORAL_TABLET | Freq: Four times a day (QID) | ORAL | Status: DC | PRN
  Administered 2018-06-11 – 2018-06-12 (×2): 1 via ORAL
  Filled 2018-06-11 (×2): qty 1

## 2018-06-11 NOTE — Progress Notes (Signed)
[facility] PROGRESS NOTE    Progress Note  [name]  LOV:[id number] DOB: Mar 14, 1933 DOA: 06/10/2018 PCP: Dorothyann Peng, MD     Brief Narrative:   Helen Richardson is an 85 y.o. female past medical history significant for unspecified heart failure, diabetes mellitus type 2, hypertension morbid obesity was brought into the ED due to progressive worsening generalized weakness associated with an episode of fever malaise decreased oral intake and lower extremity edema, she also relates wrist and MCP joints arthralgia since yesterday.  She also relates her she has been hyperglycemic  Assessment/Plan:   Sepsis due to Acute lower UTI (urinary tract infection): She has remained afebrile leucytosis improving. She was started empirically on IV Rocephin. Culture data is pending.  KVO IV fluids. On physical exam her joints are not swollen or painful to touch.  There is some mild suprapubic pain on physical exam  AKI: In the setting of diuretics, ARB and hypovolemia. His medications were held she was started on IV fluid hydration and creatinine returned to baseline.  Hyperkalemia: Hold oral supplementation, ARB and hypovolemia with IV fluid hydration.  Hypovolemic hyponatremia: In the setting of infectious etiology and elevated blood glucose, improved with IV fluid hydration KVO IV fluids as patient is on a diet.  Type 2 diabetes mellitus with stage 3 chronic kidney disease, with long-term current use of  insulin (HCC) Blood glucose has been running high likely due to infectious etiology, check an A1c. Start long-acting insulin lower dose plus sliding scale insulin.  Normocytic anemia: Baseline hemoglobin was 15 on 05/14/2018, which is acting as an acute phase reactant. Check FOBT.    Pure hypercholesterolemia: Resume statins.  Essential HTN (hypertension): Continue to hold ARB and diuretics. Continue Coreg.   DVT prophylaxis: lovenox Family Communication:son and  grandson Disposition Plan/Barrier to D/C: Home once uti improved Code Status:     Code Status Orders  (From admission, onward)         Start     Ordered   06/10/18 1754  Full code  Continuous     06/10/18 1802        Code Status History    This patient has a current code status but no historical code status.        IV Access:    Peripheral IV   Procedures and diagnostic studies:   Dg Chest Port 1 View  Result Date: 06/10/2018 CLINICAL DATA:  Weakness. EXAM: PORTABLE CHEST 1 VIEW COMPARISON:  None. FINDINGS: Low lung volumes. Normal heart size. No consolidation or edema. Osteopenia. IMPRESSION: Low lung volumes.  No active disease. Electronically Signed   By: Elsie Stain M.D.   On: 06/10/2018 15:05     Medical Consultants:    None.  Anti-Infectives:   IV Rocephin  Subjective:    [name] she relates her appetite is back she cannot provide a good history.  Objective:    Vitals:   06/10/18 1500 06/10/18 1600 06/10/18 2047 06/11/18 0504  BP: (!) 125/58 130/70 (!) 128/99 119/90  Pulse: 95 100 97 92  Resp: (!) 24 14 15 18   Temp:   97.8 F (36.6 C) (!) 97.5 F (36.4 C)  TempSrc:   Oral Oral  SpO2: 99% 100% 100% 98%  Weight:      Height:        Intake/Output Summary (Last 24 hours) at 06/11/2018 0856 Last data filed at 06/11/2018 0600 Gross per 24 hour  Intake 1240 ml  Output 200 ml  Net 1040 ml   Filed Weights   06/10/18 1343  Weight: 106 kg    Exam: General exam: In no acute distress. Respiratory system: Good air movement and clear to auscultation. Cardiovascular system: S1 & S2 heard, RRR.  Gastrointestinal system: Bowel sounds soft with suprapubic tenderness. Central nervous system: Alert and oriented. No focal neurological deficits. Extremities: No MCP or joint pain or swelling Skin: No rashes, lesions or ulcers Psychiatry: Judgement and insight are poor   Data Reviewed:    Labs: Basic Metabolic Panel: Recent Labs   Lab 06/08/18 1439 06/10/18 1403 06/11/18 0711  NA 132* 129* 134*  K 5.3* 5.5* 4.5  CL 96 97* 100  CO2 21 21* 25  GLUCOSE 259* 369* 333*  BUN 17 12 9   CREATININE 1.25* 1.01* 0.85  CALCIUM 9.1 8.7* 8.7*   GFR Estimated Creatinine Clearance: 58.4 mL/min (by C-G formula based on SCr of 0.85 mg/dL). Liver Function Tests: Recent Labs  Lab 06/10/18 1622 06/11/18 0711  AST 21 18  ALT 22 20  ALKPHOS 61 67  BILITOT 0.7 0.5  PROT 6.4* 6.3*  ALBUMIN 2.0* 2.0*   No results for input(s): LIPASE, AMYLASE in the last 168 hours. No results for input(s): AMMONIA in the last 168 hours. Coagulation profile No results for input(s): INR, PROTIME in the last 168 hours.  CBC: Recent Labs  Lab 06/10/18 1403 06/11/18 0711  WBC 12.3* 11.8*  NEUTROABS 9.4* 8.8*  HGB 9.3* 9.5*  HCT 30.0* 29.0*  MCV 86.0 86.3  PLT 391 368   Cardiac Enzymes: Recent Labs  Lab 06/10/18 1403  TROPONINI <0.03   BNP (last 3 results) Recent Labs    05/21/18 1624  PROBNP 392   CBG: Recent Labs  Lab 06/10/18 1345 06/11/18 0646  GLUCAP 333* 321*   D-Dimer: No results for input(s): DDIMER in the last 72 hours. Hgb A1c: No results for input(s): HGBA1C in the last 72 hours. Lipid Profile: No results for input(s): CHOL, HDL, LDLCALC, TRIG, CHOLHDL, LDLDIRECT in the last 72 hours. Thyroid function studies: No results for input(s): TSH, T4TOTAL, T3FREE, THYROIDAB in the last 72 hours.  Invalid input(s): FREET3 Anemia work up: Recent Labs    06/10/18 1708  VITAMINB12 1,112*  FOLATE 11.4  FERRITIN 654*  TIBC 162*  IRON 12*  RETICCTPCT 1.8   Sepsis Labs: Recent Labs  Lab 06/10/18 1403 06/11/18 0711  WBC 12.3* 11.8*   Microbiology No results found for this or any previous visit (from the past 240 hour(s)).   Medications:   . aspirin  81 mg Oral QHS  . carvedilol  6.25 mg Oral BID  . enoxaparin (LOVENOX) injection  40 mg Subcutaneous Q24H  . furosemide  40 mg Oral Daily  . insulin  aspart  0-20 Units Subcutaneous TID WC  . linagliptin  5 mg Oral Daily   Continuous Infusions: . cefTRIAXone (ROCEPHIN)  IV    . sodium chloride        LOS: 1 day   Helen Richardson  [facility]  06/11/2018, 8:56 AM

## 2018-06-11 NOTE — Evaluation (Signed)
Physical Therapy Evaluation Patient Details Name: Helen Richardson MRN: [id number] DOB: 10/08/1933 Today's Date: 06/11/2018   History of Present Illness  Helen Richardson is a 85 y.o. female with medical history significant of unspecified type CHF, type 2 diabetes, hyperlipidemia, hypertension, morbid obesity who is brought to the emergency department due to progressively worse generalized weakness associated with an episode of fever, malaise, decreased oral intake edema of ankles, wrists and MCP joints, but minimal arthralgia since yesterday.    Clinical Impression  Pt admitted with above diagnosis. Pt currently with functional limitations due to the deficits listed below (see PT Problem List). PTA, pt reports living at home with her grandson, mod I with mobility utilizing RW for household ambulation. Today, yelling out in pain at attempts of moving to sitting EOB. Appears weak, AO to person and place only. Without progression will need sub acute rehab   Pt will benefit from skilled PT to increase their independence and safety with mobility to allow discharge to the venue listed below.       Follow Up Recommendations SNF    Equipment Recommendations  None recommended by PT    Recommendations for Other Services       Precautions / Restrictions Precautions Precautions: Fall Restrictions Weight Bearing Restrictions: No      Mobility  Bed Mobility Overal bed mobility: Needs Assistance Bed Mobility: Supine to Sit     Supine to sit: Min assist        Transfers                    Ambulation/Gait                Stairs            Wheelchair Mobility    Modified Rankin (Stroke Patients Only)       Balance Overall balance assessment: Needs assistance   Sitting balance-[name]: Fair                                       Pertinent Vitals/Pain Pain Assessment: Faces Faces Pain Scale: Hurts whole lot Pain Location: L hip Pain  Descriptors / Indicators: Aching Pain Intervention(s): Limited activity within patient's tolerance;Monitored during session    Home Living Family/patient expects to be discharged to:: Private residence Living Arrangements: Children Available Help at Discharge: Family Type of Home: House Home Access: Level entry     Home Layout: One level Home Equipment: None      Prior Function Level of Independence: Needs assistance         Comments: reports ambulating aroud home with a RW, lives with grandson and son per RN     Hand Dominance        Extremity/Trunk Assessment   Upper Extremity Assessment Upper Extremity Assessment: Generalized weakness    Lower Extremity Assessment Lower Extremity Assessment: Generalized weakness       Communication   Communication: No difficulties  Cognition Arousal/Alertness: Awake/alert Behavior During Therapy: WFL for tasks assessed/performed Overall Cognitive Status: Impaired/Different from baseline                                 General Comments: AO to person and place only.       General Comments      Exercises     Assessment/Plan  PT Assessment Patient needs continued PT services  PT Problem List Decreased strength       PT Treatment Interventions DME instruction;Gait training;Stair training;Functional mobility training;Therapeutic activities;Therapeutic exercise    PT Goals (Current goals can be found in the Care Plan section)  Acute Rehab PT Goals Patient Stated Goal: non stated PT Goal Formulation: With patient Potential to Achieve Goals: Good    Frequency Min 3X/week   Barriers to discharge        Co-evaluation               AM-PAC PT "6 Clicks" Mobility  Outcome Measure Help needed turning from your back to your side while in a flat bed without using bedrails?: A Little Help needed moving from lying on your back to sitting on the side of a flat bed without using bedrails?: A  Lot Help needed moving to and from a bed to a chair (including a wheelchair)?: Total Help needed standing up from a chair using your arms (e.g., wheelchair or bedside chair)?: Total Help needed to walk in hospital room?: Total Help needed climbing 3-5 steps with a railing? : Total 6 Click Score: 9    End of Session Equipment Utilized During Treatment: Gait belt Activity Tolerance: Patient tolerated treatment well Patient left: in bed Nurse Communication: Mobility status PT Visit Diagnosis: Unsteadiness on feet (R26.81)    Time: 1440-1500 PT Time Calculation (min) (ACUTE ONLY): 20 min   Charges:   PT Evaluation $PT Eval Low Complexity: 1 Low          Etta Grandchild, PT, DPT Acute Rehabilitation Services Pager: [phone] Office: [phone]    Etta Grandchild 06/11/2018, 3:23 PM

## 2018-06-11 NOTE — Progress Notes (Addendum)
Inpatient Diabetes Program Recommendations  AACE/ADA: New Consensus Statement on Inpatient Glycemic Control (2015)  Target Ranges:  Prepandial:   less than 140 mg/dL      Peak postprandial:   less than 180 mg/dL (1-2 hours)      Critically ill patients:  140 - 180 mg/dL   Lab Results  Component Value Date   GLUCAP 194 (H) 06/11/2018   HGBA1C 12.1 (H) 06/11/2018    Diabetes coordinator working remotely and spoke to patient via telephone. Had a brief conversation about her current Hgb  A1c of 12.1% (average of 301mg /dl over the past 2-3 months). Current infection likely part of the increase in her Hgb A1c from previous values of 10.4% (03/2018) and 9.6% (11/2017). Patient was asking me why she was there and her bedside RN was explaining to patient about being there for an infection. As there are several notes in chart from outpatient appts regarding diabetes I just encouraged patient to continue to go to her follow up appts when she is out of the hospital. Noted detailed recent progress note 4/6 from Chronic Care Management RN in regards to diabetes management.  -- Will follow during hospitalization.--  Jamelle Rushing RN, MSN Diabetes Coordinator Inpatient Glycemic Control Team Team Pager: [phone] (8am-5pm)

## 2018-06-11 NOTE — Progress Notes (Signed)
Arrived from E.D. via stretcher,with regular mask.Not on any type of precautions.Placed in medical bed comfortably.Alert and oriented x 4 .Fall prevention education done.Bed alarm set to moderate sensitivity.No skin issue as assessed with Caitlin R.N.

## 2018-06-12 DIAGNOSIS — A4150 Gram-negative sepsis, unspecified: Secondary | ICD-10-CM

## 2018-06-12 LAB — URINE CULTURE: Culture: >=100000 — AB

## 2018-06-12 LAB — GLUCOSE, CAPILLARY
Glucose-Capillary: 118 mg/dL — ABNORMAL HIGH (ref 70–99)
Glucose-Capillary: 212 mg/dL — ABNORMAL HIGH (ref 70–99)

## 2018-06-12 LAB — RHEUMATOID FACTOR: Rheumatoid fact SerPl-aCnc: 17.4 IU/mL — ABNORMAL HIGH (ref 0.0–13.9)

## 2018-06-12 LAB — ANA: Anti Nuclear Antibody (ANA): NEGATIVE

## 2018-06-12 MED ORDER — AMOXICILLIN 500 MG PO CAPS: 500.0000 mg | ORAL_CAPSULE | Freq: Three times a day (TID) | ORAL | 0 refills | Status: AC

## 2018-06-12 MED ORDER — AMOXICILLIN 500 MG PO CAPS
500.0000 mg | ORAL_CAPSULE | Freq: Three times a day (TID) | ORAL | Status: DC
  Administered 2018-06-12: 500 mg via ORAL
  Filled 2018-06-12 (×3): qty 1

## 2018-06-12 NOTE — TOC Transition Note (Signed)
Transition of Care [facility]) - CM/SW Discharge Note   Patient Details  Name: Helen Richardson MRN: [id number] Date of Birth: 08/11/1933  [facility]) CM/SW Contact:  Gwenlyn Fudge, LCSWA Phone Number: 06/12/2018, 3:52 PM   Clinical Narrative:   CSW spoke with the family and they have decided that they would like to take the patient home with home health. Family chose to have [facility] to be their [facility] agency. Patient's granddaughter Morrie Sheldon will be at the main entrance of the [facility] at 4:30 to pick up the patient.     Final next level of care: Home w Home Health Services Barriers to Discharge: No Barriers Identified   Patient Goals and CMS Choice Patient states their goals for this hospitalization and ongoing recovery are:: Family wants patient to return home CMS Medicare.gov Compare Post Acute Care list provided to:: Patient Represenative (must comment)(Pt son Rifky Kastor by email) Choice offered to / list presented to : Adult Children  Discharge Placement   Existing PASRR number confirmed : 06/12/18              Name of family member notified: Philemon Kingdom    Discharge Plan and Services In-house Referral: Clinical Social Work Discharge Planning Services: NA Post Acute Care Choice: Skilled Nursing Facility          DME Arranged: N/A DME Agency: NA HH Arranged: Social Work [employer] Agency: [facility])   Social Determinants of Health (SDOH) Interventions     Readmission Risk Interventions Readmission Risk Prevention Plan 06/12/2018  Transportation Screening Complete  PCP or Specialist Appt within 5-7 Days Complete  Home Care Screening Complete  Medication Review (RN CM) Complete  Some recent data might be hidden

## 2018-06-12 NOTE — Progress Notes (Signed)
Helen Richardson to be D/C'd Home per MD order.  Discussed prescriptions and follow up appointments with the patient. Prescriptions given to patient, medication list explained in detail. Pt verbalized understanding.  Allergies as of 06/12/2018   No Known Allergies     Medication List    TAKE these medications   Advocate Lancets Misc   Advocate Lancing Device Misc   Alcohol Prep 70 % Pads   amoxicillin 500 MG capsule Commonly known as:  AMOXIL Take 1 capsule (500 mg total) by mouth every 8 (eight) hours for 5 days.   aspirin 81 MG chewable tablet Chew 81 mg by mouth at bedtime.   CALTRATE 600+D PO Take 1 tablet by mouth daily.   carvedilol 6.25 MG tablet Commonly known as:  COREG TAKE 1 TABLET BY MOUTH TWICE DAILY What changed:  when to take this   Comfort EZ Pen Needles 32G X 4 MM Misc Generic drug:  Insulin Pen Needle   furosemide 40 MG tablet Commonly known as:  LASIX Take 40 mg by mouth daily.   HumaLOG Mix 75/25 KwikPen (75-25) 100 UNIT/ML Kwikpen Generic drug:  Insulin Lispro Prot & Lispro ADMINISTER 20 UNITS UNDER THE SKIN BEFORE BREAKFAST AND DINNER. DO NOT EXCEED. MAX DOSE OF 50 UNITS A DAY What changed:  See the new instructions.   polyethylene glycol powder 17 GM/SCOOP powder Commonly known as:  GLYCOLAX/MIRALAX Mix 1 capful in drink and take by mouth 1 to 3 times daily as needed for daily soft stools  OTC   potassium chloride SA 20 MEQ tablet Commonly known as:  K-DUR,KLOR-CON TAKE 1 TABLET BY MOUTH TWICE A DAY   rosuvastatin 20 MG tablet Commonly known as:  CRESTOR TAKE 1 TABLET BY MOUTH EVERY DAY   telmisartan 80 MG tablet Commonly known as:  MICARDIS TAKE 1 TABLET BY MOUTH ONCE DAILY   Tradjenta 5 MG Tabs tablet Generic drug:  linagliptin TAKE 1 TABLET BY MOUTH ONCE DAILY What changed:  how much to take   Vitamin D3 25 MCG (1000 UT) Caps Take 1,000 Units by mouth daily. 1 per day       Vitals:   06/12/18 0438 06/12/18 0942  BP: (!)  155/67 (!) 156/83  Pulse: 94 (!) 111  Resp: 18 18  Temp: 98 F (36.7 C) 98.1 F (36.7 C)  SpO2: 98% 99%    Skin clean, dry and intact without evidence of skin break down, no evidence of skin tears noted. IV catheter discontinued intact. Site without signs and symptoms of complications. Dressing and pressure applied. Pt denies pain at this time. No complaints noted.  An After Visit Summary was printed and given to the patient. Patient escorted via WC, and D/C home via private auto.

## 2018-06-12 NOTE — Progress Notes (Signed)
CSW received a phone call back from the family that they would like to go home with home health.   CSW called [name] with [facility] coordinated services. CSW informed MD that the patient would need PT/OT, nurse for diabetic case management, nurse aid, and social worker. MD only put in for PT/OT. CSW spoke with case management and case management reassured that these services could be added at a later time.   Drucilla Schmidt, MSW, LCSW-A Clinical Social Worker [employer]

## 2018-06-12 NOTE — TOC Transition Note (Deleted)
Transition of Care [facility]) - CM/SW   Patient Details  Name: MADONNA TRATHEN MRN: [id number] Date of Birth: 12/12/1933  Transition of Care [facility]) CM/SW Contact:  Gwenlyn Fudge, LCSWA Phone Number: 06/12/2018, 2:04 PM   Clinical Narrative:       Final next level of care: Home w Home Health Services Barriers to Discharge: Other (comment)(Need to arrange Home Health)   Patient Goals and CMS Choice Patient states their goals for this hospitalization and ongoing recovery are:: Family wants patient to discharge home CMS Medicare.gov Compare Post Acute Care list provided to:: Patient Represenative (must comment)(Pt son Aliciana Maclaughlin by email) Choice offered to / list presented to : Adult Children  Discharge Placement   Existing PASRR number confirmed : 06/12/18              Name of family member notified: Philemon Kingdom    Discharge Plan and Services In-house Referral: Clinical Social Work Discharge Planning Services: NA Post Acute Care Choice: Skilled Nursing Facility          DME Arranged: N/A DME Agency: NA HH Arranged: Social Work [employer] Agency: [facility]   Social Determinants of Health (SDOH) Interventions     Readmission Risk Interventions Readmission Risk Prevention Plan 06/12/2018  Transportation Screening Complete  PCP or Specialist Appt within 5-7 Days Complete  Home Care Screening Complete  Medication Review (RN CM) Complete  Some recent data might be hidden

## 2018-06-12 NOTE — Discharge Summary (Signed)
Physician Discharge Summary  Helen Richardson BJY:[id number] DOB: 03-25-1933 DOA: 06/10/2018  PCP: Dorothyann Peng, MD  Admit date: 06/10/2018 Discharge date: 06/12/2018  Admitted From: Home Disposition:  Home  Recommendations for Outpatient Follow-up:  1. Follow up with PCP in 1-2 weeks, check a ferritin as an outpatient. 2.   Home Health:No Equipment/Devices:None  Discharge Condition:Stable CODE STATUS:Full  Diet recommendation: Heart Healthy   Brief/Interim Summary: 85 y.o. female past medical history significant for unspecified heart failure, diabetes mellitus type 2, hypertension morbid obesity was brought into the ED due to progressive worsening generalized weakness associated with an episode of fever malaise decreased oral intake and lower extremity edema, she also relates wrist and MCP joints arthralgia since yesterday.  She also relates her she has been hyperglycemic  Discharge Diagnoses:  Principal Problem:   Acute lower UTI (urinary tract infection) Active Problems:   Type 2 diabetes mellitus with stage 3 chronic kidney disease, with long-term current use of insulin (HCC)   Pure hypercholesterolemia   HTN (hypertension)   Joint swelling   Hyperkalemia   Hyponatremia   Normocytic anemia   AKI (acute kidney injury) (HCC)   Sepsis due to gram-negative UTI (HCC)  Sepsis due to acute UTI: She was admitted and started on empiric IV Rocephin and IV fluids. Urine culture came back E. coli pansensitive. She was changed to oral amoxicillin which should continue as an outpatient for 5 additional days.  Acute kidney injury: Likely prerenal azotemia in the setting of diuretics and ARB. These were held she was started on IV fluid hydration and her creatinine returned to baseline.  Hyperkalemia: Likely due to ARB hypovolemia and oral supplementation this were held, her potassium improved.  Hypovolemic hyponatremia: In the setting of infectious etiology and elevated blood  glucose, this improved with IV fluid hydration.  Diabetes mellitus type 2 with chronic kidney disease stage III with long-term use of insulin:  No changes.  Normocytic anemia: None 0.5, and FOBT was negative she will follow-up with primary care doctor as an outpatient. Her ferritin was 654 probably acting as an acute phase reactant.  Essential hypertension: She can resume her outpatient antihypertensive medication regimen  Discharge Instructions  Discharge Instructions    Diet - low sodium heart healthy   Complete by:  As directed    Increase activity slowly   Complete by:  As directed      Allergies as of 06/12/2018   No Known Allergies     Medication List    TAKE these medications   Advocate Lancets Misc   Advocate Lancing Device Misc   Alcohol Prep 70 % Pads   amoxicillin 500 MG capsule Commonly known as:  AMOXIL Take 1 capsule (500 mg total) by mouth every 8 (eight) hours for 5 days.   aspirin 81 MG chewable tablet Chew 81 mg by mouth at bedtime.   CALTRATE 600+D PO Take 1 tablet by mouth daily.   carvedilol 6.25 MG tablet Commonly known as:  COREG TAKE 1 TABLET BY MOUTH TWICE DAILY What changed:  when to take this   Comfort EZ Pen Needles 32G X 4 MM Misc Generic drug:  Insulin Pen Needle   furosemide 40 MG tablet Commonly known as:  LASIX Take 40 mg by mouth daily.   HumaLOG Mix 75/25 KwikPen (75-25) 100 UNIT/ML Kwikpen Generic drug:  Insulin Lispro Prot & Lispro ADMINISTER 20 UNITS UNDER THE SKIN BEFORE BREAKFAST AND DINNER. DO NOT EXCEED. MAX DOSE OF 50 UNITS A DAY What  changed:  See the new instructions.   polyethylene glycol powder 17 GM/SCOOP powder Commonly known as:  GLYCOLAX/MIRALAX Mix 1 capful in drink and take by mouth 1 to 3 times daily as needed for daily soft stools  OTC   potassium chloride SA 20 MEQ tablet Commonly known as:  K-DUR,KLOR-CON TAKE 1 TABLET BY MOUTH TWICE A DAY   rosuvastatin 20 MG tablet Commonly known as:   CRESTOR TAKE 1 TABLET BY MOUTH EVERY DAY   telmisartan 80 MG tablet Commonly known as:  MICARDIS TAKE 1 TABLET BY MOUTH ONCE DAILY   Tradjenta 5 MG Tabs tablet Generic drug:  linagliptin TAKE 1 TABLET BY MOUTH ONCE DAILY What changed:  how much to take   Vitamin D3 25 MCG (1000 UT) Caps Take 1,000 Units by mouth daily. 1 per day       No Known Allergies  Consultations:  None   Procedures/Studies: Dg Shoulder Right  Result Date: 05/18/2018 CLINICAL DATA:  85 year old female with chronic right shoulder pain for months. No trauma. Initial encounter. EXAM: RIGHT SHOULDER - 2+ VIEW COMPARISON:  09/13/2010 chest x-ray. FINDINGS: Mild to moderate acromioclavicular joint and glenohumeral joint degenerative changes. No fracture or dislocation. Calcification inferior to the glenohumeral joint may be vascular in origin. No lung abnormality noted. IMPRESSION: Mild to moderate acromioclavicular joint and glenohumeral joint degenerative changes. Calcification inferior to the glenohumeral joint may be vascular in origin (subclavian/axillary artery). Electronically Signed   By: Lacy Duverney M.D.   On: 05/18/2018 18:17   Ct Abdomen Pelvis W Contrast  Result Date: 05/14/2018 CLINICAL DATA:  Lower quadrant abdominal pain, right greater than left since last evening. EXAM: CT ABDOMEN AND PELVIS WITH CONTRAST TECHNIQUE: Multidetector CT imaging of the abdomen and pelvis was performed using the standard protocol following bolus administration of intravenous contrast. CONTRAST:  OMNIPAQUE IOHEXOL 300 MG/ML  SOLN COMPARISON:  None. FINDINGS: Lower chest: The included heart size is normal.  Clear lung bases. Hepatobiliary: Limited by motion artifacts. No gallstones or biliary dilatation. No enhancing liver lesion. Subtle hypodensity in the posterior right hepatic lobe too small to further characterize measuring 6 mm is identified statistically consistent with a cyst, biliary hamartoma or tiny  hemangioma. Pancreas: No mass or definite inflammation though limited by motion artifacts. Spleen: No splenomegaly or mass. Adrenals/Urinary Tract: Adrenal glands are unremarkable. There appears to be a nonobstructing lower pole left renal calculus measuring up to 4 mm. No hydroureteronephrosis. No enhancing renal mass. Bladder is unremarkable. Stomach/Bowel: Stomach is within normal limits. Appendix appears normal. No evidence of bowel wall thickening, distention, or inflammatory changes. Moderate stool retention within the rectosigmoid. Vascular/Lymphatic: Aortic atherosclerosis. No enlarged abdominal or pelvic lymph nodes. Reproductive: Status post hysterectomy. No adnexal masses. Other: Small periumbilical fat containing hernia. No free air or free fluid. Musculoskeletal: Degenerative disc disease T12 through L5. IMPRESSION: 1. Moderate stool retention within the rectosigmoid query constipation. 2. No acute bowel obstruction or inflammation. 3. 4 mm calcification is suggested in the left kidney without obstruction. 4. Tiny too small to characterize cyst or hemangioma in the right hepatic lobe. 5. Thoracolumbar spondylosis. Electronically Signed   By: Tollie Eth M.D.   On: 05/14/2018 20:22   Dg Chest Port 1 View  Result Date: 06/10/2018 CLINICAL DATA:  Weakness. EXAM: PORTABLE CHEST 1 VIEW COMPARISON:  None. FINDINGS: Low lung volumes. Normal heart size. No consolidation or edema. Osteopenia. IMPRESSION: Low lung volumes.  No active disease. Electronically Signed   By: Elsie Stain  M.D.   On: 06/10/2018 15:05     Subjective: No complaints feels great she tolerated her diet, she relates that this is the best meal she has had over the last 3 days.  Discharge Exam: Vitals:   06/11/18 1957 06/12/18 0438  BP: (!) 157/63 (!) 155/67  Pulse: (!) 106 94  Resp: 18 18  Temp: 98.1 F (36.7 C) 98 F (36.7 C)  SpO2: 99% 98%     General: Pt is alert, awake, not in acute distress Cardiovascular: RRR,  S1/S2 +, no rubs, no gallops Respiratory: CTA bilaterally, no wheezing, no rhonchi Abdominal: Soft, NT, ND, bowel sounds + Extremities: no edema, no cyanosis    The results of significant diagnostics from this hospitalization (including imaging, microbiology, ancillary and laboratory) are listed below for reference.     Microbiology: Recent Results (from the past 240 hour(s))  Urine culture     Status: Abnormal   Collection Time: 06/10/18  2:19 PM  Result Value Ref Range Status   Specimen Description URINE, CLEAN CATCH  Final   Special Requests   Final    NONE Performed at [facility], [address], [place], [place] [address]    Culture >=100,000 COLONIES/mL ESCHERICHIA COLI (A)  Final   Report Status 06/12/2018 FINAL  Final   Organism ID, Bacteria ESCHERICHIA COLI (A)  Final      Susceptibility   Escherichia coli - MIC*    AMPICILLIN 8 SENSITIVE Sensitive     CEFAZOLIN <=4 SENSITIVE Sensitive     CEFTRIAXONE <=1 SENSITIVE Sensitive     CIPROFLOXACIN <=0.25 SENSITIVE Sensitive     GENTAMICIN <=1 SENSITIVE Sensitive     IMIPENEM <=0.25 SENSITIVE Sensitive     NITROFURANTOIN <=16 SENSITIVE Sensitive     TRIMETH/SULFA <=20 SENSITIVE Sensitive     AMPICILLIN/SULBACTAM 4 SENSITIVE Sensitive     PIP/TAZO <=4 SENSITIVE Sensitive     Extended ESBL NEGATIVE Sensitive     * >=100,000 COLONIES/mL ESCHERICHIA COLI     Labs: BNP (last 3 results) Recent Labs    06/10/18 1403  BNP 76.8   Basic Metabolic Panel: Recent Labs  Lab 06/08/18 1439 06/10/18 1403 06/11/18 0711  NA 132* 129* 134*  K 5.3* 5.5* 4.5  CL 96 97* 100  CO2 21 21* 25  GLUCOSE 259* 369* 333*  BUN CREATININE 1.25* 1.01* 0.85  CALCIUM 9.1 8.7* 8.7*   Liver Function Tests: Recent Labs  Lab 06/10/18 1622 06/11/18 0711  AST 21 18  ALT 22 20  ALKPHOS 61 67  BILITOT 0.7 0.5  PROT 6.4* 6.3*  ALBUMIN 2.0* 2.0*   No results for input(s): LIPASE, AMYLASE in the last 168 hours. No  results for input(s): AMMONIA in the last 168 hours. CBC: Recent Labs  Lab 06/10/18 1403 06/11/18 0711  WBC 12.3* 11.8*  NEUTROABS 9.4* 8.8*  HGB 9.3* 9.5*  HCT 30.0* 29.0*  MCV 86.0 86.3  PLT 391 368   Cardiac Enzymes: Recent Labs  Lab 06/10/18 1403  TROPONINI <0.03   BNP: Invalid input(s): POCBNP CBG: Recent Labs  Lab 06/11/18 0646 06/11/18 1113 06/11/18 1619 06/11/18 2202 06/12/18 0649  GLUCAP 321* 265* 194* 115* 118*   D-Dimer No results for input(s): DDIMER in the last 72 hours. Hgb A1c Recent Labs    06/11/18 0711  HGBA1C 12.1*   Lipid Profile No results for input(s): CHOL, HDL, LDLCALC, TRIG, CHOLHDL, LDLDIRECT in the last 72 hours. Thyroid function studies No results  for input(s): TSH, T4TOTAL, T3FREE, THYROIDAB in the last 72 hours.  Invalid input(s): FREET3 Anemia work up Recent Labs    06/10/18 1708  VITAMINB12 1,112*  FOLATE 11.4  FERRITIN 654*  TIBC 162*  IRON 12*  RETICCTPCT 1.8   Urinalysis    Component Value Date/Time   COLORURINE YELLOW 06/10/2018 1419   APPEARANCEUR CLEAR 06/10/2018 1419   LABSPEC <1.005 (L) 06/10/2018 1419   PHURINE 6.5 06/10/2018 1419   GLUCOSEU >=500 (A) 06/10/2018 1419   HGBUR NEGATIVE 06/10/2018 1419   BILIRUBINUR NEGATIVE 06/10/2018 1419   KETONESUR NEGATIVE 06/10/2018 1419   PROTEINUR NEGATIVE 06/10/2018 1419   UROBILINOGEN 0.2 04/21/2009 1319   NITRITE POSITIVE (A) 06/10/2018 1419   LEUKOCYTESUR TRACE (A) 06/10/2018 1419   Sepsis Labs Invalid input(s): PROCALCITONIN,  WBC,  LACTICIDVEN Microbiology Recent Results (from the past 240 hour(s))  Urine culture     Status: Abnormal   Collection Time: 06/10/18  2:19 PM  Result Value Ref Range Status   Specimen Description URINE, CLEAN CATCH  Final   Special Requests   Final    NONE Performed at [facility], [address] [place], [place] [address]    Culture >=100,000 COLONIES/mL ESCHERICHIA COLI (A)  Final   Report Status 06/12/2018  FINAL  Final   Organism ID, Bacteria ESCHERICHIA COLI (A)  Final      Susceptibility   Escherichia coli - MIC*    AMPICILLIN 8 SENSITIVE Sensitive     CEFAZOLIN <=4 SENSITIVE Sensitive     CEFTRIAXONE <=1 SENSITIVE Sensitive     CIPROFLOXACIN <=0.25 SENSITIVE Sensitive     GENTAMICIN <=1 SENSITIVE Sensitive     IMIPENEM <=0.25 SENSITIVE Sensitive     NITROFURANTOIN <=16 SENSITIVE Sensitive     TRIMETH/SULFA <=20 SENSITIVE Sensitive     AMPICILLIN/SULBACTAM 4 SENSITIVE Sensitive     PIP/TAZO <=4 SENSITIVE Sensitive     Extended ESBL NEGATIVE Sensitive     * >=100,000 COLONIES/mL ESCHERICHIA COLI     Time coordinating discharge: 40 minutes  SIGNED:   [name], MD  [facility]

## 2018-06-12 NOTE — [place] FL2 (Signed)
[place] MEDICAID FL2 LEVEL OF CARE SCREENING TOOL     IDENTIFICATION  Patient Name: Helen Richardson Birthdate: 07/29/1933 Sex: female Admission Date (Current Location): 06/10/2018  [facility] [place]   Facility and Address:  [facility], [address], [place], [place] [address]      Provider Number: [id number]  Attending Physician Name and Address:  Marinda ElkFeliz Ortiz, Abraham, MD  Relative Name and Phone Number:  [facility]ard & Beatrix ShipperMichelle Schaaf, Son/Daughter in law, [phone] & Wyline MoodBrandon Mongillo, Grandson, [phone]    Current Level of Care: Hospital Recommended Level of Care: Skilled Nursing Facility Prior Approval Number:    Date Approved/Denied: 06/12/18 PASRR Number: [id number]  Discharge Plan: SNF    Current Diagnoses: Patient Active Problem List   Diagnosis Date Noted  . AKI (acute kidney injury) (HCC) 06/11/2018  . Sepsis due to gram-negative UTI (HCC) 06/11/2018  . Acute lower UTI (urinary tract infection) 06/10/2018  . HTN (hypertension) 06/10/2018  . Joint swelling 06/10/2018  . Hyperkalemia 06/10/2018  . Hyponatremia 06/10/2018  . Normocytic anemia 06/10/2018  . Type 2 diabetes mellitus with stage 3 chronic kidney disease, with long-term current use of insulin (HCC) 03/08/2018  . 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 Pure hypercholesterolemia 03/08/2018  . Class 3 severe obesity due to excess calories with serious comorbidity and body mass index (BMI) of 40.0 to 44.9 in adult [facility](HCC) 03/08/2018    Orientation RESPIRATION BLADDER Height & Weight     Self, Time, Place  Normal Incontinent, External catheter Weight: 233 lb 11 oz (106 kg) Height:  5\' 3"  (160 cm)  BEHAVIORAL SYMPTOMS/MOOD NEUROLOGICAL BOWEL NUTRITION STATUS      Continent Diet(low sodium/heart healthy, thin liquids)  AMBULATORY STATUS COMMUNICATION OF NEEDS Skin   Limited Assist Verbally                         Personal Care Assistance  Level of Assistance  Bathing, Feeding, Dressing, Total care Bathing Assistance: Limited assistance Feeding assistance: Independent Dressing Assistance: Limited assistance Total Care Assistance: Limited assistance   Functional Limitations Info  Sight, Hearing, Speech Sight Info: Adequate Hearing Info: Adequate Speech Info: Adequate    SPECIAL CARE FACTORS FREQUENCY  PT (By licensed PT), OT (By licensed OT)     PT Frequency: 5x/wk OT Frequency: 5x/wk            Contractures Contractures Info: Not present    Additional Factors Info  Code Status, Allergies, Insulin Sliding Scale Code Status Info: Full Code Allergies Info: no known allergies   Insulin Sliding Scale Info: insulin aspart novolog 0-15 units 3x daily w/meals, insulin aspart novolog 0-5 units at bedtime, insulin aspart novolog 4 units 3x daily w/meals, insulin detemir levemir injection 10 units daily        Current Medications (06/12/2018):  This is the current hospital active medication list Current Facility-Administered Medications  Medication Dose Route Frequency Provider Last Rate Last Dose  . acetaminophen (TYLENOL) tablet 650 mg  650 mg Oral Q6H PRN Bodenheimer, Charles A, NP      . amoxicillin (AMOXIL) capsule 500 mg  500 mg Oral Q8H Marinda ElkFeliz Ortiz, Abraham, MD   500 mg at 06/12/18 1026  . aspirin chewable tablet 81 mg  81 mg Oral QHS Bobette Mortiz, David Manuel, MD   81 mg at 06/11/18 2225  . carvedilol (COREG) tablet 6.25 mg  6.25 mg Oral BID Bobette Mortiz, David Manuel, MD   6.25 mg  at 06/12/18 0939  . enoxaparin (LOVENOX) injection 40 mg  40 mg Subcutaneous Q24H Bobette Mo, MD   40 mg at 06/11/18 1642  . furosemide (LASIX) tablet 40 mg  40 mg Oral Daily Bobette Mo, MD   40 mg at 06/12/18 0939  . HYDROcodone-acetaminophen (NORCO/VICODIN) 5-325 MG per tablet 1-2 tablet  1-2 tablet Oral Q6H PRN Macon Large, NP   1 tablet at 06/12/18 0939  . insulin aspart (novoLOG) injection 0-15 Units  0-15 Units  Subcutaneous TID WC Marinda Elk, MD   3 Units at 06/11/18 1638  . insulin aspart (novoLOG) injection 0-5 Units  0-5 Units Subcutaneous QHS Marinda Elk, MD      . insulin aspart (novoLOG) injection 4 Units  4 Units Subcutaneous TID WC Marinda Elk, MD   4 Units at 06/12/18 0840  . insulin detemir (LEVEMIR) injection 10 Units  10 Units Subcutaneous BID Marinda Elk, MD   10 Units at 06/12/18 1026  . linagliptin (TRADJENTA) tablet 5 mg  5 mg Oral Daily Bobette Mo, MD   5 mg at 06/12/18 5176  . ondansetron (ZOFRAN) tablet 4 mg  4 mg Oral Q6H PRN Bobette Mo, MD       Or  . ondansetron [facility]) injection 4 mg  4 mg Intravenous Q6H PRN Bobette Mo, MD      . sodium chloride 0.9 % bolus 1,000 mL  1,000 mL Intravenous Once Bobette Mo, MD         Discharge Medications: Please see discharge summary for a list of discharge medications.  Relevant Imaging Results:  Relevant Lab Results:   Additional Information SSN: [id number]  Nada Boozer Katana Berthold, LCSWA

## 2018-06-12 NOTE — TOC Initial Note (Signed)
Transition of Care [facility]) - Initial/Assessment Note    Patient Details  Name: Helen Richardson MRN: [id number] Date of Birth: 12-15-1933  [facility] of [facility]) CM/SW Contact:    Gelene Mink, [place] Phone Number: 06/12/2018, 10:55 AM  Clinical Narrative:            CSW called the patients grandson Erlene Quan. CSW introduced herself and explained her role. CSW asked if he lives with his grandmother. He confirmed that he does live with his grandmother. CSW explained why she was calling and asked if he could help make a recommendation for his grandmother for SNF. He provided the CSW with his uncle's information. He is the oldest child and he and his wife make decisions for his grandmother.   CSW called and spoke with the patient's son Laelah Siravo. CSW introduced herself and explained her role. His mother had never been to SNF before. CSW explained the process and went over insurance information. CSW explained also, with COVID-19 it could lessen her chance of getting into a facility of choice. He explained that was fine, he agreed that his mother needed additional rehab. CSW obtained the email address to send the facility list.   [place] emailed the facility list to the patient's son and daughter in law.           Expected Discharge Plan: Skilled Nursing Facility Barriers to Discharge: No SNF bed   Patient Goals and CMS Choice Patient states their goals for this hospitalization and ongoing recovery are:: family wants pt to get better CMS Medicare.gov Compare Post Acute Care list provided to:: Patient Represenative (must comment)(Pt son Ericha Whittingham by email) Choice offered to / list presented to : Adult Children  Expected Discharge Plan and Services Expected Discharge Plan: Skilled Nursing Facility In-house Referral: Clinical Social Work Discharge Planning Services: NA Post Acute Care Choice: [place] Living arrangements for the past 2 months: [place] Expected Discharge Date: 06/12/18               DME Arranged: N/A DME Agency: NA HH Arranged: NA    Prior Living Arrangements/Services Living arrangements for the past 2 months: [place] Lives with:: Other (Comment), Self(with grandson) Patient language and need for interpreter reviewed:: Yes Do you feel safe going back to the place where you live?: Yes      Need for Family Participation in Patient Care: Yes (Comment) Care giver support system in place?: Yes (comment)   Criminal Activity/Legal Involvement Pertinent to Current Situation/Hospitalization: No - Comment as needed  Activities of Daily Living      Permission Sought/Granted Permission sought to share information with : Case Manager Permission granted to share information with : Yes, Verbal Permission Granted  Share Information with NAME: Ilona Sorrel and [place] granted to share info w AGENCY: All SNF  Permission granted to share info w Relationship: son and daughter in law     Emotional Assessment Appearance:: Appears stated age Attitude/Demeanor/Rapport: Unable to Assess Affect (typically observed): Unable to Assess Orientation: : Oriented to Self, Oriented to  Time, Oriented to Place Alcohol / Substance Use: Not Applicable Psych Involvement: No (comment)  Admission diagnosis:  Weakness [R53.1] ESR raised [R70.0] Arthralgia, unspecified joint [M25.50] Patient Active Problem List   Diagnosis Date Noted  . AKI (acute kidney injury) ([place]) 06/11/2018  . Sepsis due to gram-negative UTI ([place]) 06/11/2018  . Acute lower UTI (urinary tract infection) 06/10/2018  . HTN (hypertension) 06/10/2018  .  Joint swelling 06/10/2018  . Hyperkalemia 06/10/2018  . Hyponatremia 06/10/2018  . Normocytic anemia 06/10/2018  . Type 2 diabetes mellitus with stage 3 chronic kidney disease, with long-term current use of insulin ([place]) 03/08/2018  . 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  Pure hypercholesterolemia 03/08/2018  . Class 3 severe obesity due to excess calories with serious comorbidity and body mass index (BMI) of 40.0 to 44.9 in adult [facility]) 03/08/2018   PCP:  Glendale Chard, MD Pharmacy:   [facility] [place] [place] [address] Phone: [phone] Fax: [phone]     Social Determinants of Health (SDOH) Interventions    Readmission Risk Interventions Readmission Risk Prevention Plan 06/12/2018  Transportation Screening Complete  PCP or Specialist Appt within 5-7 Days Complete  Home Care Screening Complete  Medication Review (RN CM) Complete  Some recent data might be hidden

## 2018-06-14 ENCOUNTER — Telehealth: Payer: Self-pay

## 2018-06-14 ENCOUNTER — Other Ambulatory Visit: Payer: Self-pay | Admitting: Internal Medicine

## 2018-06-14 NOTE — Telephone Encounter (Signed)
I left the pt a message to call the office back. I was calling because Dr. Allyne Gee wants me to check on the pt and to schedule the pt an appt for a hospital f/u 30 min virtual in the evening appt.

## 2018-06-15 ENCOUNTER — Ambulatory Visit (INDEPENDENT_AMBULATORY_CARE_PROVIDER_SITE_OTHER): Payer: Medicare Other

## 2018-06-15 ENCOUNTER — Telehealth: Payer: Self-pay

## 2018-06-15 ENCOUNTER — Telehealth: Payer: Medicare Other

## 2018-06-15 ENCOUNTER — Other Ambulatory Visit: Payer: Self-pay

## 2018-06-15 DIAGNOSIS — Z794 Long term (current) use of insulin: Secondary | ICD-10-CM | POA: Diagnosis not present

## 2018-06-15 DIAGNOSIS — R531 Weakness: Secondary | ICD-10-CM

## 2018-06-15 DIAGNOSIS — N183 Chronic kidney disease, stage 3 unspecified: Secondary | ICD-10-CM

## 2018-06-15 DIAGNOSIS — M2550 Pain in unspecified joint: Secondary | ICD-10-CM

## 2018-06-15 DIAGNOSIS — E1122 Type 2 diabetes mellitus with diabetic chronic kidney disease: Secondary | ICD-10-CM | POA: Diagnosis not present

## 2018-06-15 DIAGNOSIS — R600 Localized edema: Secondary | ICD-10-CM

## 2018-06-15 DIAGNOSIS — I129 Hypertensive chronic kidney disease with stage 1 through stage 4 chronic kidney disease, or unspecified chronic kidney disease: Secondary | ICD-10-CM

## 2018-06-15 NOTE — Telephone Encounter (Signed)
Demetrious called for pt stating she went back to the ER and was told she has a UTI and Athritis in her hands every morning when she wakes up her hands are swollen and she is unable to use them she wants to know if you can prescribe the pt something.  I called and spoke with patient's grandson and he stated the patient did receive some medicine for her UTI and that he is also giving her tylenol Athritis he wanted to know if that was ok? And he also stated she is taking her fluid pill. I advised him that it should be ok and I scheduled pt for a web visit tomorrow evening as requested by dr.Sanders [facility]

## 2018-06-15 NOTE — Telephone Encounter (Signed)
Transition Care Management Follow-up Telephone Call  Date of discharge and from where: [facility] on 06/12/2018  How have you been since you were released from the hospital? Swelling in both hands  Any questions or concerns? No   Items Reviewed:  Did the pt receive and understand the discharge instructions provided? Yes   Medications obtained and verified? Yes   Any new allergies since your discharge? No   Dietary orders reviewed? Yes  Do you have support at home? Yes   Other (ie: DME, Home Health, etc) home health as far as known  Functional Questionnaire: (I = Independent and D = Dependent) ADL's: D  Bathing/Dressing- D   Meal Prep- D  Eating- D  Maintaining continence- D  Transferring/Ambulation- D  Managing Meds- D   Follow up appointments reviewed:    PCP Hospital f/u appt confirmed? Yes  Scheduled to see Dr. Allyne Gee (virtually) on 06/16/2018 @ 3:30p.  Specialist Hospital f/u appt confirmed? No  Scheduled to see  on  @ .  Are transportation arrangements needed? No   If their condition worsens, is the pt aware to call  their PCP or go to the ED? Yes  Was the patient provided with contact information for the PCP's office or ED? Yes  Was the pt encouraged to call back with questions or concerns? Yes

## 2018-06-15 NOTE — Telephone Encounter (Signed)
Kesa from [facility] called regarding pt stating they are able to go out and see the pt tomorrow 04/15 to start nursing services and after that is completed they will do a PT Eval. Please give them a call if this is ok. [phone]  RETURNED HER CALL AND GAVE A VERBAL OK. YRL,RMA

## 2018-06-16 ENCOUNTER — Ambulatory Visit (INDEPENDENT_AMBULATORY_CARE_PROVIDER_SITE_OTHER): Payer: Medicare Other | Admitting: Internal Medicine

## 2018-06-16 ENCOUNTER — Encounter: Payer: Self-pay | Admitting: Internal Medicine

## 2018-06-16 VITALS — Ht 63.0 in

## 2018-06-16 DIAGNOSIS — N179 Acute kidney failure, unspecified: Secondary | ICD-10-CM

## 2018-06-16 DIAGNOSIS — E1122 Type 2 diabetes mellitus with diabetic chronic kidney disease: Secondary | ICD-10-CM | POA: Diagnosis not present

## 2018-06-16 DIAGNOSIS — E878 Other disorders of electrolyte and fluid balance, not elsewhere classified: Secondary | ICD-10-CM

## 2018-06-16 DIAGNOSIS — N183 Chronic kidney disease, stage 3 unspecified: Secondary | ICD-10-CM

## 2018-06-16 DIAGNOSIS — Z794 Long term (current) use of insulin: Secondary | ICD-10-CM

## 2018-06-16 DIAGNOSIS — N3000 Acute cystitis without hematuria: Secondary | ICD-10-CM

## 2018-06-16 DIAGNOSIS — M2540 Effusion, unspecified joint: Secondary | ICD-10-CM

## 2018-06-16 MED ORDER — DICLOFENAC SODIUM 1 % TD GEL: TRANSDERMAL | 1 refills | Status: DC

## 2018-06-16 NOTE — Chronic Care Management (AMB) (Signed)
Chronic Care Management   Follow Up Note   06/15/2018 Name: Helen Richardson MRN: [id number] DOB: 09/22/1933  Referred by: Helen Richardson Reason for referral : Chronic Care Management (CCM Telephone Follow Up )   Helen Richardson is a 85 y.o. year old female who is a primary care patient of Helen Richardson. The CCM team was consulted for assistance with chronic disease management and care coordination needs.    Review of patient status, including review of consultants reports, relevant laboratory and other test results, and collaboration with appropriate care team members and the patient's provider was performed as part of comprehensive patient evaluation and provision of chronic care management services.    I spoke with the patient's grandson Helen Richardson by telephone today.   Goals Addressed      Patient Stated   . "Her blood sugars are running around 200 in the morning before breakfast" (pt-stated)       Grandson stated  Current Barriers:  [name] Kitchen. Knowledge Deficits related to Diabetes and Self Health Management   Nurse Case Manager Clinical Goal(s):  [name] Kitchen. Over the next 30 days, patient/caregiver will verbalize basic understanding of Diabetes disease process and Self Health Management plan as evidenced by grandson Helen Richardson will verbalize increased understanding of the disease process for Diabetes including how to meal plan, carb counting, signs/symptoms of hypo/hyperglycemic events.   Interventions:   CCM Telephone Follow Up call completed with grandson Helen Richardson  Assessed for understanding of post discharge instructions   Assessed for start of HH services-Helen missed a call from [facility] but will call them back today  Assessed for medication changes and or completion of discharge antibiotic  Provided education related to importance of patient staying well hydrated, drinking plenty of water and emptying bladder when feeling the urge to help avoid recurrent UTI   Provided education related to Diabetes disease process and importance of adhering to prescribed txt plan to help better manage CBG's and AIC and to help lower risk for new or worsening complications  Provided education related to etiology and management of CKD  Assessed for adherence to monitoring and recording CBG's (grandson reports am fasting blood sugars are running in the 200's)  Provided printed information related to Diabetes nutrition and meal planning via securemail to grandson Helen at Helen Richardson  Confirmed grandson Helen Richardson has RNCM contact # and hours of availability  Sent referral to embedded PharmD Helen Richardson requesting she evaluate patient's current pharmacological therapy for her Diabetes; advised grandson also expressed patient hardship paying for her current insulin regimen  Sent in basket message to Helen Richardson with an update regarding CBG's and request for Helen Richardson to evaluate patient's current txt medication regimen . Scheduled CCM follow up visit with grandson Helen Richardson for 2-3 days  Patient Self Care Activities:   Verbalizes understanding of the information and education provided . Currently UNABLE TO independently provide self-care  . Self administers medications as prescribed (grandson is preparing all meds using a pill bed) . Attends all scheduled provider appointments (grandson and family are providing transportation) . Grandson calls pharmacy for medication refills . Needs assistance with ADL's independently . Needs assistance with IADL's independently . Helen Richardson calls provider office for new concerns or questions  Please see past updates related to this goal by clicking on the "Past Updates" button in the selected goal         Telephone follow up appointment with CCM team member scheduled for: 06/18/18  Helen SaleAngel Ayjah Show,  RN,CCM Care Management Coordinator [facility] Care Management/[facility]  Direct Phone:  [phone]

## 2018-06-16 NOTE — Patient Instructions (Signed)
Visit Information  Goals Addressed      Patient Stated   . "Her blood sugars are running around 200 in the morning before breakfast" (pt-stated)       Grandson stated  Current Barriers:  [name] Kitchen Knowledge Deficits related to Diabetes and Self Health Management   Nurse Case Manager Clinical Goal(s):  [name] Kitchen Over the next 30 days, patient/caregiver will verbalize basic understanding of Diabetes disease process and Self Health Management plan as evidenced by grandson [name] will verbalize increased understanding of the disease process for Diabetes including how to meal plan, carb counting, signs/symptoms of hypo/hyperglycemic events.   Interventions:   CCM Telephone Follow Up call completed with grandson Johnnisha Schnabel  Assessed for understanding of post discharge instructions   Assessed for start of HH services-Brandon missed a call from [facility] but will call them back today  Assessed for medication changes and or completion of discharge antibiotic  Provided education related to importance of patient staying well hydrated, drinking plenty of water and emptying bladder when feeling the urge to help avoid recurrent UTI  Provided education related to Diabetes disease process and importance of adhering to prescribed txt plan to help better manage CBG's and AIC and to help lower risk for new or worsening complications  Provided education related to etiology and management of CKD  Assessed for adherence to monitoring and recording CBG's (grandson reports am fasting blood sugars are running in the 200's)  Provided printed information related to Diabetes nutrition and meal planning via securemail to grandson Brandon at [email or web address]  Confirmed grandson [name] has RNCM contact # and hours of availability  Sent referral to embedded PharmD Vanice Sarah requesting she evaluate patient's current pharmacological therapy for her Diabetes; advised grandson also expressed patient  hardship paying for her current insulin regimen  Sent in basket message to Dr. Allyne Gee with an update regarding CBG's and request for Vanice Sarah to evaluate patient's current txt medication regimen . Scheduled CCM follow up visit with grandson [name] for 2-3 days  Patient Self Care Activities:   Verbalizes understanding of the information and education provided . Currently UNABLE TO independently provide self-care  . Self administers medications as prescribed (grandson is preparing all meds using a pill bed) . Attends all scheduled provider appointments (grandson and family are providing transportation) . Grandson calls pharmacy for medication refills . Needs assistance with ADL's independently . Needs assistance with IADL's independently . Leanna Battles calls provider office for new concerns or questions  Please see past updates related to this goal by clicking on the "Past Updates" button in the selected goal        The patient verbalized understanding of instructions provided today and declined a print copy of patient instruction materials.   Telephone follow up appointment with CCM team member scheduled for: 06/18/18  Delsa Sale, [facility] Care Management Coordinator [facility] Care Management/[facility]  Direct Phone: [phone]

## 2018-06-16 NOTE — Progress Notes (Addendum)
Virtual Visit via Video Note   This visit type was conducted due to national recommendations for restrictions regarding the COVID-19 Pandemic (e.g. social distancing).  This format is felt to be most appropriate for this patient at this time.  All issues noted in this document were discussed and addressed.  No physical exam was performed (except for noted visual exam findings with Video Visits).  Please refer to the patient's chart (MyChart message for video visits and phone note for telephone visits) for the patient's consent to telehealth for [facility].  Date:  06/16/2018   ID:  Helen Richardson, DOB [birth year], MRN [id number]  Patient Location:  Home, Helen Richardson is also present. His girlfriend, Caryl Comes is also present.    Provider location:   Office   Chief Complaint:  Hospital f/u  History of Present Illness:    Helen Richardson is a 85 y.o. female who presents via video conferencing for a telehealth visit today.    The patient does not have symptoms concerning for COVID-19 infection (fever, chills, cough, or new shortness of breath).   She presents today for virtual hospital f/u visit due to [place] pandemic. She and her grandson, thought this type of visit was best. She was admitted to [facility]  On 4/9 for further evaluation of weakness and joint swelling. She was found to have UTI and was admitted for further evaluation. She was given iv abx. Hospital course significant for acute kidney injury, hyponatremia, and hyperkalemia - all thought to be secondary to dehydration. She was discharged in stable condition on 06/12/2018. Unfortunately, since her discharge - she reports that she does not feel much better. She denies having any urinary symptoms. She does c/o worsening joint pains. She is not sure what is contributing to her sx. No recent trauma.     Past Medical History:  Diagnosis Date  . CHF (congestive heart failure) ([place])   . Diabetes mellitus without complication ([place])   .  High cholesterol   . HTN (hypertension)   . Obesity    History reviewed. No pertinent surgical history.   No outpatient medications have been marked as taking for the 06/16/18 encounter (Office Visit) with Glendale Chard, MD.     Allergies:   Patient has no known allergies.   Social History   Tobacco Use  . Smoking status: Never Smoker  . Smokeless tobacco: Never Used  Substance Use Topics  . Alcohol use: No  . Drug use: No     Family Hx: The patient's family history includes Diabetes in her mother; Hypertension in her father and mother; Stroke in her father.  ROS:   Please see the history of present illness.    Review of Systems  Constitutional: Negative.   Respiratory: Negative.   Cardiovascular: Negative.   Gastrointestinal: Negative.   Genitourinary: Negative for dysuria, frequency, hematuria and urgency.  Neurological: Negative.   Psychiatric/Behavioral: Negative.     All other systems reviewed and are negative.   Labs/Other Tests and Data Reviewed:    Recent Labs: 05/21/2018: NT-Pro BNP 392 06/10/2018: B Natriuretic Peptide 76.8 06/11/2018: ALT 20; BUN 9; Creatinine, Ser 0.85; Hemoglobin 9.5; Platelets 368; Potassium 4.5; Sodium 134   Recent Lipid Panel Lab Results  Component Value Date/Time   CHOL 124 03/08/2018 03:34 PM   TRIG 59 03/08/2018 03:34 PM   HDL 73 03/08/2018 03:34 PM   CHOLHDL 1.7 03/08/2018 03:34 PM   LDLCALC 39 03/08/2018 03:34 PM    Wt Readings from Last  3 Encounters:  06/10/18 233 lb 11 oz (106 kg)  05/14/18 233 lb 11 oz (106 kg)  03/08/18 222 lb 6.4 oz (100.9 kg)     Exam:    Vital Signs:  Ht _0  (1.6 m)   BMI 41.40 kg/m     Physical Exam  Constitutional: She is oriented to person, place, and time and well-developed, well-nourished, and in no distress.  HENT:  Head: Normocephalic and atraumatic.  Neck: Normal range of motion.  Pulmonary/Chest: Effort normal.  Musculoskeletal:     Comments: SWOLLEN MCP JOINTS B/L, NO  OVERLYING ERYTHEMA  Neurological: She is alert and oriented to person, place, and time.  Psychiatric: Affect normal.  Nursing note and vitals reviewed.   ASSESSMENT & PLAN:     1. Acute cystitis without hematuria  TCM PERFORMED. A MEMBER OF THE CLINICAL TEAM SPOKE WITH THE PATIENT AND HER GRANDSON UPON DISCHARGE. DISCHARGE SUMMARY WAS REVIEWED IN FULL DETAIL DURING THE VISIT. MEDS RECONCILED AND COMPARED TO DISCHARGE MEDS. MEDICATION LIST WAS UPDATED AND REVIEWED WITH THE PATIENT AND HER FAMILY.  SHE IS ENCOURAGED TO COMPLETE FULL COURSE OF ABX. SHE IS ALSO ENCOURAGED TO STAY WELL HYDRATED.    2. Acute kidney injury ([place])  I will check repeat BMET. She is encouraged to stay well hydrated.   3. Electrolyte imbalance  She had hyperkalemia and hyponatremia while hospitalized. I will recheck these levels at her next blood draw.   4. Type 2 diabetes mellitus with stage 3 chronic kidney disease, with long-term current use of insulin ([place])  She agrees to come in for labwork. I will check an a1c. I will increase her insulin - Humalog 75/25 to 25 units before breakfast and dinner. Greater than 50% face to face time was spent in counseling and coordination of care. I spent more than 25 minutes discussing nutrition with the patient and her family. She is encouraged to avoid sugary beverages and diet drinks. She admits to drinking a lot of diet green tea. The family shared the foods purchased at grocery store, all healthy. The patient feels that they did not buy any "good food".  Pt advised that her taste buds will learn to enjoy these foods.    5. Joint swelling  Lab results from recent hospitalization was reviewed in full detail. I will check additional labs as listed below.    COVID-19 Education: The signs and symptoms of COVID-19 were discussed with the patient and how to seek care for testing (follow up with PCP or arrange E-visit).  The importance of social distancing was discussed  today.  Patient Risk:   After full review of this patients clinical status, I feel that they are at least moderate risk at this time.  Time:   Today, I have spent 32 minutes 56 seconds with the patient with telehealth technology discussing above diagnoses.     Medication Adjustments/Labs and Tests Ordered: Current medicines are reviewed at length with the patient today.  Concerns regarding medicines are outlined above.   Tests Ordered: Orders Placed This Encounter  Procedures  . CYCLIC CITRUL PEPTIDE ANTIBODY, IGG/IGA  . Ferritin  . Hemoglobin A1c  . BMP8+EGFR   Medication Changes: Meds ordered this encounter  Medications  . diclofenac sodium (VOLTAREN) 1 % GEL    Sig: APPLY TO AFFECTED AREA 3 TIMES DAILY AS NEEDED    Dispense:  100 g    Refill:  1    Disposition:  Follow up in 2 month(s)  Signed, Bailey Mech  Shawnie Dapper, MD

## 2018-06-18 ENCOUNTER — Ambulatory Visit: Payer: Self-pay | Admitting: Pharmacist

## 2018-06-18 ENCOUNTER — Telehealth: Payer: Medicare Other

## 2018-06-18 DIAGNOSIS — Z6835 Body mass index (BMI) 35.0-35.9, adult: Secondary | ICD-10-CM

## 2018-06-18 DIAGNOSIS — I129 Hypertensive chronic kidney disease with stage 1 through stage 4 chronic kidney disease, or unspecified chronic kidney disease: Secondary | ICD-10-CM | POA: Diagnosis not present

## 2018-06-18 DIAGNOSIS — N390 Urinary tract infection, site not specified: Secondary | ICD-10-CM

## 2018-06-18 DIAGNOSIS — N183 Chronic kidney disease, stage 3 unspecified: Secondary | ICD-10-CM

## 2018-06-18 DIAGNOSIS — R600 Localized edema: Secondary | ICD-10-CM

## 2018-06-18 DIAGNOSIS — Z794 Long term (current) use of insulin: Secondary | ICD-10-CM

## 2018-06-18 DIAGNOSIS — E1122 Type 2 diabetes mellitus with diabetic chronic kidney disease: Secondary | ICD-10-CM

## 2018-06-18 DIAGNOSIS — R531 Weakness: Secondary | ICD-10-CM

## 2018-06-18 DIAGNOSIS — E7800 Pure hypercholesterolemia, unspecified: Secondary | ICD-10-CM

## 2018-06-20 ENCOUNTER — Encounter: Payer: Self-pay | Admitting: Internal Medicine

## 2018-06-21 ENCOUNTER — Ambulatory Visit: Payer: Self-pay

## 2018-06-21 ENCOUNTER — Ambulatory Visit: Payer: Medicare Other

## 2018-06-21 ENCOUNTER — Other Ambulatory Visit: Payer: Self-pay | Admitting: Pharmacy Technician

## 2018-06-21 DIAGNOSIS — N183 Chronic kidney disease, stage 3 unspecified: Secondary | ICD-10-CM

## 2018-06-21 DIAGNOSIS — Z794 Long term (current) use of insulin: Principal | ICD-10-CM

## 2018-06-21 DIAGNOSIS — R600 Localized edema: Secondary | ICD-10-CM

## 2018-06-21 DIAGNOSIS — N179 Acute kidney failure, unspecified: Secondary | ICD-10-CM

## 2018-06-21 DIAGNOSIS — W19XXXA Unspecified fall, initial encounter: Secondary | ICD-10-CM

## 2018-06-21 DIAGNOSIS — E1122 Type 2 diabetes mellitus with diabetic chronic kidney disease: Secondary | ICD-10-CM

## 2018-06-21 DIAGNOSIS — R531 Weakness: Secondary | ICD-10-CM

## 2018-06-21 NOTE — Patient Instructions (Signed)
Social Worker Visit Information  Goals we discussed today:  Goals Addressed            This Visit's Progress     Patient Stated   . "We need more help in the home" (pt-stated)       Grandson and son stated:  Current Barriers:  . Financial constraints . Limited education about community resources and insurance benefits . Inability to perform ADL's independently . Inability to perform IADL's independently  Clinical Social Work Clinical Goal(s):  [name] Kitchen Over the next 45 days, client will work with SW to address concerns related to obtaining a caregiver . Over the next 10 days, client will follow up with DSS to complete a Medicaid application as directed by SW  Interventions: . Collaboration with [facility] SW Zandra Abts regarding patient care coordination needs . [facility] SW reports she has placed an outreach call to the patients home and grandson Jilisa Dhanraj  Patient Self Care Activities:  . Currently UNABLE TO independently perform iADL's and ADL's  . Relies on family members to assist with care needs  Please see past updates related to this goal by clicking on the "Past Updates" button in the selected goal          Materials Provided: No. Patient not reached.  Follow Up Plan: SW will follow up with patient by phone over the next week.  Bevelyn Ngo, BSW, CDP [facility] / [facility] Care Management Social Worker [phone]

## 2018-06-21 NOTE — Patient Instructions (Signed)
Social Worker Visit Information  Goals we discussed today:  Goals Addressed            This Visit's Progress     Patient Stated   . "We need help with home repairs" (pt-stated)   Not on track    Grandson stated  Current Barriers:  . Financial constraints . Lacks knowledge of community resource: community housing solutions  Clinical Social Work Clinical Goal(s):  [name] Kitchen. Over the next 60 days, client will work with SW to address concerns related to home modifications  Interventions: . Telephonic check in with patients grandson, Helen Richardson performed by CCM SW . Determined Helen Richardson has yet to receive a call from Helen Richardson with [employer] . Collaboration with Helen Richardson with [employer] via secure e-mail to follow up on the status of the patients referral  Patient Self Care Activities:  . Attends all scheduled provider appointments . Calls pharmacy for medication refills . Calls provider office for new concerns or questions   Please see past updates related to this goal by clicking on the "Past Updates" button in the selected goal      . "We need more help in the home" (pt-stated)   On track    Grandson and son stated:  Current Barriers:  . Financial constraints . Limited education about community resources and insurance benefits . Inability to perform ADL's independently . Inability to perform IADL's independently  Clinical Social Work Clinical Goal(s):  [name] Kitchen. Over the next 45 days, client will work with SW to address concerns related to obtaining a caregiver . Over the next 10 days, client will follow up with DSS to complete a Medicaid application as directed by SW  Interventions: . Telephonic follow up to the patients grandson Helen Richardson by CCM SW . Assessed completion status of patients Medicaid application - Helen Richardson reports confusion on how to answer certain questions . Collaborated with [facility]; home health agency to request SW  discipline be added to assist with patient long term care needs in the home and Medicaid application . Collaborated with patient primary care provider to update on added discipline and plan of care orders to be [facility]  Patient Self Care Activities:  . Currently UNABLE TO independently perform iADL's and ADL's  . Relies on family members to assist with care needs  Please see past updates related to this goal by clicking on the "Past Updates" button in the selected goal      . "We want back-up trnsportation options" (pt-stated)   On track    Grandson Stated:  Current Barriers:  . Limited social support . ADL IADL limitations . Lacks knowledge of community resource: [employer]  Clinical Social Work Clinical Goal(s):  [name] Kitchen. Over the next 30 days, client will work with SW to address concerns related to transportation resources  Interventions:  Telephone follow up by CCM SW  Helen Richardson reports he has submitted [employer] application via mail  Collaborated with Helen Foresterourtney Richardson with [employer] via secure e-mail to confirm receipt of patient application  Patient Self Care Activities:  . Currently UNABLE TO independently drive self   Please see past updates related to this goal by clicking on the "Past Updates" button in the selected goal          Materials Provided: Verbal education about Medicaid benefits provided by phone  Follow Up Plan: SW will follow up with patient by phone over the next 7-10 days  Helen Richardson, BSW, CDP [facility] / Helen Richardson [facility] Helen [facility] Care Management Social Worker [phone]

## 2018-06-21 NOTE — Progress Notes (Signed)
  Chronic Care Management  Visit Note  06/21/2018 Name: Helen Richardson MRN: [id number] DOB: [birth year]  Referred by: Dorothyann Peng, MD Reason for referral : Chronic Care Management -->medication assistance/management   Helen Richardson is a 85 y.o. year old female who is a primary care patient of Dorothyann Peng, MD. The CCM team was consulted for assistance with chronic disease management and care coordination needs.   Review of patient status, including review of consultants reports, relevant laboratory and other test results, and collaboration with appropriate care team members and the patient's provider was performed as part of comprehensive patient evaluation and provision of chronic care management services.   I spoke with the patient's grandson Helen Richardson by telephone today.    Objective:  Goals Addressed            This Visit's Progress   . I need help paying for my insulin & Tradjenta (pt-stated)       Current Barriers:  . Financial Barriers  Pharmacist Clinical Goal(s):  [name] Kitchen Over the next 21 days, patient will work with CCM Pharmacist to address needs related to financial assistance for insulin  Interventions: . Collaboration with [facility] CPhT, Lilla Shook that will be assisting with applying for patient assistance programs  . Will mail patient application for [employer] (Humalog Kwikpen 75/25) and [employer] [name])  Patient Self Care Activities:  . Self administers medications as prescribed . Attends all scheduled provider appointments  Initial goal documentation    PLAN: -Grandson to return call to discuss patient's BGs and medications more in depth. -Comprehensive medication review to be performed. -The CM team will reach out to the patient again over the next 7 days if call not returned sooner.     [name], PharmD, BCPS Clinical Pharmacist, [facility]  [facility]  Direct  Dial: [phone]

## 2018-06-21 NOTE — Chronic Care Management (AMB) (Signed)
Chronic Care Management    Clinical Social Work Follow Up Note  06/21/2018 Name: Helen Richardson MRN: [id number] DOB: 07/18/1933  Helen Richardson is a 85 y.o. year old female who is a primary care patient of [name], [name], MD. The CCM team was consulted for assistance with chronic care management and care coordination of resource needs.   Review of patient status, including review of consultants reports, other relevant assessments, and collaboration with appropriate care team members and the patient's provider was performed as part of comprehensive patient evaluation and provision of chronic care management services.     I placed a follow up call to the patients grandson, Helen Richardson, to follow up on patient goal progression. Goals Addressed            This Visit's Progress     Patient Stated   . "We need help with home repairs" (pt-stated)   Not on track    Grandson stated  Current Barriers:  . Financial constraints . Lacks knowledge of community resource: community housing solutions  Clinical Social Work Clinical Goal(s):  [name] Kitchen. Over the next 60 days, client will work with SW to address concerns related to home modifications  Interventions: . Telephonic check in with patients grandson, Helen Richardson performed by CCM SW . Determined Helen Richardson has yet to receive a call from AssurantLynda Hopkins with [employer] . Collaboration with AssurantLynda Hopkins with [employer] via secure e-mail to follow up on the status of the patients referral  Patient Self Care Activities:  . Attends all scheduled provider appointments . Calls pharmacy for medication refills . Calls provider office for new concerns or questions   Please see past updates related to this goal by clicking on the "Past Updates" button in the selected goal      . "We need more help in the home" (pt-stated)   On track    Grandson and son stated:  Current Barriers:  . Financial constraints . Limited education  about community resources and insurance benefits . Inability to perform ADL's independently . Inability to perform IADL's independently  Clinical Social Work Clinical Goal(s):  [name] Kitchen. Over the next 45 days, client will work with SW to address concerns related to obtaining a caregiver . Over the next 10 days, client will follow up with DSS to complete a Medicaid application as directed by SW  Interventions: . Telephonic follow up to the patients grandson Helen Richardson by CCM SW . Assessed completion status of patients Medicaid application - Helen Richardson reports confusion on how to answer certain questions . Collaborated with [facility]; home health agency to request SW discipline be added to assist with patient long term care needs in the home and Medicaid application . Collaborated with patient primary care provider to update on added discipline and plan of care orders to be sent to provider [facility] signature  Patient Self Care Activities:  . Currently UNABLE TO independently perform iADL's and ADL's  . Relies on family members to assist with care needs  Please see past updates related to this goal by clicking on the "Past Updates" button in the selected goal      . "We want back-up trnsportation options" (pt-stated)   On track    Grandson Stated:  Current Barriers:  . Limited social support . ADL IADL limitations . Lacks knowledge of community resource: [employer]  Clinical Social Work Clinical Goal(s):  [name] Kitchen. Over the next 30 days, client will work with SW to  address concerns related to transportation resources  Interventions:  Telephone follow up by CCM SW  Helen Junes reports he has submitted [employer] application via mail  Collaborated with Warnell Forester with [employer] via secure e-mail to confirm receipt of patient application  Patient Self Care Activities:  . Currently UNABLE TO independently drive self   Please see past updates related to this goal by clicking on the "Past Updates"  button in the selected goal        Follow Up Plan: SW will follow up with patient by phone over the next 7-10 days.  Bevelyn Ngo, BSW, CDP [facility] / [facility] Care Management Social Worker [phone]  Total time spent performing care coordination and/or care management activities with the patient by phone or face to face = 15 minutes.

## 2018-06-21 NOTE — Chronic Care Management (AMB) (Signed)
  Chronic Care Management   Social Work Note  06/21/2018 Name: [name] MRN: [id number] DOB: [birth year]  Helen Richardson is a 85 y.o. year old female who sees Dorothyann Peng, MD for primary care. The CCM team was consulted for assistance with [name].  I placed a return call to [facility] SW, Zandra Abts, who had contacted the provider office regarding patient community resource needs.   Goals Addressed            This Visit's Progress     Patient Stated   . "We need more help in the home" (pt-stated)       Grandson and son stated:  Current Barriers:  . Financial constraints . Limited education about community resources and insurance benefits . Inability to perform ADL's independently . Inability to perform IADL's independently  Clinical Social Work Clinical Goal(s):  [name] Kitchen Over the next 45 days, client will work with SW to address concerns related to obtaining a caregiver . Over the next 10 days, client will follow up with DSS to complete a Medicaid application as directed by SW  Interventions:  Collaboration with [facility] SW Zandra Abts regarding patient care coordination needs  [facility] SW reports she has placed an outreach call to the patients home and grandson Jearl Havard  Patient Self Care Activities:  . Currently UNABLE TO independently perform iADL's and ADL's  . Relies on family members to assist with care needs  Please see past updates related to this goal by clicking on the "Past Updates" button in the selected goal         Follow Up Plan: SW will follow up with patient by phone over the next week.  Bevelyn Ngo, BSW, CDP [facility] / [facility] Care Management Social Worker [phone]  Total time spent performing care coordination and/or care management activities with the patient by phone or face to face = 10 minutes.

## 2018-06-21 NOTE — Patient Outreach (Signed)
[facility] Customer service manager [facility]) Care Management  06/21/2018  Helen Richardson 1933-08-13 [id number]   .                       Medication Assistance Referral  Referral From: [facility] R[facility] Kandra Nicolas [facility] RPh)  Medication/Company: [name] / B-I Patient application portion:  Mailed Provider application portion: Faxed  to Dr. Allyne Gee  Medication/Company: Humalog / [name] Cares Patient application portion:  Mailed Provider application portion: Faxed  to Dr. Allyne Gee   Follow up:  Will follow up with patient in 7-10 business days to confirm application(s) have been received.  Suzan Slick [name] CPhT Certified Pharmacy Technician [facility] Care Management Direct [phone]

## 2018-06-21 NOTE — Patient Instructions (Signed)
Visit Information  Goals Addressed            This Visit's Progress   . I need help paying for my insulin & Tradjenta (pt-stated)       Current Barriers:  . Financial Barriers  Pharmacist Clinical Goal(s):  [name] Kitchen Over the next 21 days, patient will work with CCM Pharmacist to address needs related to financial assistance for insulin  Interventions: . Comprehensive medication review performed. . Collaboration with [facility] CPhT, Lilla Shook that will be assisting with applying for patient assistance programs  . Will mail patient application for [employer] (Humalog Kwikpen 75/25)  Patient Self Care Activities:  . Self administers medications as prescribed . Attends all scheduled provider appointments  Initial goal documentation        The patient verbalized understanding of instructions provided today and declined a print copy of patient instruction materials.   The CM team will reach out to the patient again over the next 7 days.   [name], PharmD, BCPS Clinical Pharmacist, [facility]  [facility]  Direct Dial: [phone]

## 2018-06-22 ENCOUNTER — Other Ambulatory Visit: Payer: Self-pay

## 2018-06-22 ENCOUNTER — Telehealth: Payer: Medicare Other

## 2018-06-22 MED ORDER — INSULIN LISPRO PROT & LISPRO (75-25 MIX) 100 UNIT/ML KWIKPEN: 25.0000 [IU] | PEN_INJECTOR | Freq: Two times a day (BID) | SUBCUTANEOUS | 0 refills | Status: DC

## 2018-06-23 ENCOUNTER — Telehealth: Payer: Medicare Other

## 2018-06-25 ENCOUNTER — Ambulatory Visit: Payer: Self-pay | Admitting: Pharmacist

## 2018-06-25 ENCOUNTER — Other Ambulatory Visit: Payer: Self-pay | Admitting: Internal Medicine

## 2018-06-25 NOTE — Progress Notes (Signed)
  Chronic Care Management   Outreach Note  06/25/2018 Name: Helen Richardson MRN: [id number] DOB: Jul 24, 1933  Referred by: Dorothyann Peng, MD Reason for referral : Chronic care management (diabetes)   An unsuccessful telephone outreach was attempted today. The patient was referred to the case management team by for assistance with Diabetes  Follow Up Plan: A HIPPA compliant phone message was left for the patient providing contact information and requesting a return call.   I will follow up with patient & grandson next week   [name], PharmD, BCPS Clinical Pharmacist, [facility]  [facility]  Direct Dial: [phone]

## 2018-06-28 ENCOUNTER — Ambulatory Visit: Payer: Medicare Other

## 2018-06-28 DIAGNOSIS — N183 Chronic kidney disease, stage 3 unspecified: Secondary | ICD-10-CM

## 2018-06-28 DIAGNOSIS — Z794 Long term (current) use of insulin: Principal | ICD-10-CM

## 2018-06-28 DIAGNOSIS — N179 Acute kidney failure, unspecified: Secondary | ICD-10-CM

## 2018-06-28 DIAGNOSIS — R600 Localized edema: Secondary | ICD-10-CM

## 2018-06-28 DIAGNOSIS — E1122 Type 2 diabetes mellitus with diabetic chronic kidney disease: Secondary | ICD-10-CM

## 2018-06-28 NOTE — Chronic Care Management (AMB) (Signed)
  Chronic Care Management    Clinical Social Work Follow Up Note  06/28/2018 Name: Helen Richardson MRN: [id number] DOB: 11-26-1933  Helen Richardson is a 85 y.o. year old female who is a primary care patient of Helen Peng, MD. The CCM team was consulted for assistance with [name].   Review of patient status, including review of consultants reports, other relevant assessments, and collaboration with appropriate care team members and the patient's provider was performed as part of comprehensive patient evaluation and provision of chronic care management services.     Goals Addressed            This Visit's Progress     Patient Stated   . "We need more help in the home" (pt-stated)   Not on track    Grandson and son stated:  Current Barriers:  . Financial constraints . Limited education about community resources and insurance benefits . Inability to perform ADL's independently . Inability to perform IADL's independently  Clinical Social Work Clinical Goal(s):  [name] Kitchen Over the next 45 days, client will work with SW to address concerns related to obtaining a caregiver . Over the next 10 days, client will follow up with DSS to complete a Medicaid application as directed by SW  Interventions: . Telephonic outreach to the patients grandson by CCM SW . Assessed if the patient had been visited by SW from [facility] to assist with completion of Medicaid application - [name] stated "let me check with my aunt and I will call you back in 15 minutes . Return call yet to be received x 2 hours later. Scheduled follow up call within the next 10-14 days.  Patient Self Care Activities:  . Currently UNABLE TO independently perform iADL's and ADL's  . Relies on family members to assist with care needs  Please see past updates related to this goal by clicking on the "Past Updates" button in the selected goal      . "We want back-up trnsportation options" (pt-stated)   Not on track     Grandson Stated:  Current Barriers:  . Limited social support . ADL IADL limitations . Lacks knowledge of community resource: [employer]  Clinical Social Work Clinical Goal(s):  [name] Kitchen Over the next 30 days, client will work with SW to address concerns related to transportation resources  Interventions:  Received response from [employer] with [employer] stating the patients application had yet to be received  Patient Self Care Activities:  . Currently UNABLE TO independently drive self   Please see past updates related to this goal by clicking on the "Past Updates" button in the selected goal          Follow Up Plan: SW will follow up with patient by phone over the next 7-14 days.   Bevelyn Ngo, BSW, CDP [facility] Care Management Social Worker [phone]  Total time spent performing care coordination and/or care management activities with the patient by phone or face to face = 15 minutes.

## 2018-06-28 NOTE — Patient Instructions (Signed)
Social Worker Visit Information  Goals we discussed today:  Goals Addressed            This Visit's Progress     Patient Stated   . "We need more help in the home" (pt-stated)   Not on track    Grandson and son stated:  Current Barriers:  . Financial constraints . Limited education about community resources and insurance benefits . Inability to perform ADL's independently . Inability to perform IADL's independently  Clinical Social Work Clinical Goal(s):  [name] Kitchen Over the next 45 days, client will work with SW to address concerns related to obtaining a caregiver . Over the next 10 days, client will follow up with DSS to complete a Medicaid application as directed by SW  Interventions: . Telephonic outreach to the patients grandson by CCM SW . Assessed if the patient had been visited by SW from [facility] to assist with completion of Medicaid application - [name] stated "let me check with my aunt and I will call you back in 15 minutes . Return call yet to be received x 2 hours later. Scheduled follow up call within the next 10-14 days.  Patient Self Care Activities:  . Currently UNABLE TO independently perform iADL's and ADL's  . Relies on family members to assist with care needs  Please see past updates related to this goal by clicking on the "Past Updates" button in the selected goal      . "We want back-up trnsportation options" (pt-stated)   Not on track    Grandson Stated:  Current Barriers:  . Limited social support . ADL IADL limitations . Lacks knowledge of community resource: [employer]  Clinical Social Work Clinical Goal(s):  [name] Kitchen Over the next 30 days, client will work with SW to address concerns related to transportation resources  Interventions:  Received response from [employer] with [employer] stating the patients application had yet to be received  Patient Self Care Activities:  . Currently UNABLE TO independently drive self   Please see past updates  related to this goal by clicking on the "Past Updates" button in the selected goal          Materials Provided: No. Patient not reached.  Follow Up Plan: SW will follow up with patient by phone over the next 7-14 days   Bevelyn Ngo, BSW, CDP [facility] Care Management Social Worker [phone]

## 2018-06-29 ENCOUNTER — Ambulatory Visit: Payer: Medicare Other | Admitting: Internal Medicine

## 2018-06-29 ENCOUNTER — Telehealth: Payer: Self-pay

## 2018-06-29 ENCOUNTER — Ambulatory Visit: Payer: Medicare Other | Admitting: Pharmacist

## 2018-06-29 DIAGNOSIS — N183 Chronic kidney disease, stage 3 unspecified: Secondary | ICD-10-CM

## 2018-06-29 DIAGNOSIS — E1122 Type 2 diabetes mellitus with diabetic chronic kidney disease: Secondary | ICD-10-CM | POA: Diagnosis not present

## 2018-06-29 DIAGNOSIS — R600 Localized edema: Secondary | ICD-10-CM

## 2018-06-29 DIAGNOSIS — Z794 Long term (current) use of insulin: Principal | ICD-10-CM

## 2018-06-29 NOTE — Progress Notes (Signed)
  Chronic Care Management   Visit Note  06/29/2018 Name: Helen Richardson MRN: [id number] DOB: [birth year]  Referred by: Dorothyann Peng, MD Reason for referral : Chronic Care Management-->medication management/assistance  Helen Richardson is a 85 y.o. year old female who is a primary care patient of Dorothyann Peng, MD. The CCM team was consulted for assistance with chronic disease management and care coordination needs.   Review of patient status, including review of consultants reports, relevant laboratory and other test results, and collaboration with appropriate care team members and the patient's provider was performed as part of comprehensive patient evaluation and provision of chronic care management services.   I spoke with Helen Richardson (patient's grandson) today by telephone  Objective:   Goals Addressed            This Visit's Progress     Patient Stated   . I don't know if insulin regimen is working (pt-stated)       Current Barriers:  [name] Kitchen Knowledge Deficits related to managing diabetes  Pharmacist Clinical Goal(s):  [name] Kitchen Over the next 30 days, patient will demonstrate Improved medication adherence as evidenced by BGs within range, FBG<130, no hypoglycemia . Over the next 30 days, patient will work with CCM team to address needs related to managing diabetes  Interventions: . Comprehensive medication review performed. . Advised patient to continue checking BGs, take medications as prescribed . Discussed plans with patient for ongoing care management follow up and provided patient with direct contact information for care management team . Collaboration with RN Care Manager re: diabetes management  Patient Self Care Activities:  . Currently UNABLE TO independently manage diabetes; chronic conditions  Initial goal documentation    . I need help paying for my insulin & Tradjenta (pt-stated)       Current Barriers:  . Financial Barriers  Pharmacist Clinical Goal(s):  [name] Kitchen Over  the next 21 days, patient will work with CCM Pharmacist to address needs related to financial assistance for insulin  Interventions: . Collaboration with [facility] CPhT, Lilla Shook that will be assisting with applying for patient assistance programs  . Patient application mailed last week for [employer] (Humalog Kwikpen 75/25)  . Spoke with Helen Richardson (grandson) and he stated he was not currently with the patient.  I asked for him to call back when he was going to be there.  Notified him that application for free insulin was mailed to patient's address. CCM Pharmacist will walk him through application if additional support is needed.  Helen Richardson seemed a little overwhelmed.  He could not recall latest BG readings.  I let him know we were available for support & to please return call  Patient Self Care Activities:  . Currently UNABLE TO independently manage diabetes . Attends all scheduled provider appointments  Please see past updates related to this goal by clicking on the "Past Updates" button in the selected goal        PLAN: The CM team will reach out to the patient again over the next 5 days.   [name], PharmD, BCPS Clinical Pharmacist, [facility]  [facility]  Direct Dial: [phone]

## 2018-06-29 NOTE — Telephone Encounter (Signed)
Helen Richardson with [facility] called stating the patient's blood sugars have been elevated for the past couple of days. She thinks it is because she is in pain [phone] on 06/28/18 and today it was 238 it has been trending up everyday her other vitals are stable.  She is doing 25 units of Humalog and she is taking 5mg  of tradjenta. YRL,RMA

## 2018-06-29 NOTE — Telephone Encounter (Signed)
Patient's grandson notified YRL,RMA

## 2018-06-29 NOTE — Patient Instructions (Signed)
Visit Information  Goals Addressed            This Visit's Progress     Patient Stated   . I don't know if insulin regimen is working (pt-stated)       Current Barriers:  [name] Kitchen Knowledge Deficits related to managing diabetes  Pharmacist Clinical Goal(s):  [name] Kitchen Over the next 30 days, patient will demonstrate Improved medication adherence as evidenced by BGs within range, FBG<130, no hypoglycemia . Over the next 30 days, patient will work with CCM team to address needs related to managing diabetes  Interventions: . Comprehensive medication review performed. . Advised patient to continue checking BGs, take medications as prescribed . Discussed plans with patient for ongoing care management follow up and provided patient with direct contact information for care management team . Collaboration with RN Care Manager re: diabetes management  Patient Self Care Activities:  . Currently UNABLE TO independently manage diabetes; chronic conditions  Initial goal documentation     . I need help paying for my insulin & Tradjenta (pt-stated)       Current Barriers:  . Financial Barriers  Pharmacist Clinical Goal(s):  [name] Kitchen Over the next 21 days, patient will work with CCM Pharmacist to address needs related to financial assistance for insulin  Interventions: . Collaboration with [facility] CPhT, Lilla Shook that will be assisting with applying for patient assistance programs  . Patient application mailed last week for [employer] (Humalog Kwikpen 75/25)  . Spoke with [name] (grandson) and he stated he was not currently with the patient.  I asked for him to call back when he was going to be there.  Notified him that application for free insulin was mailed to patient's address. CCM Pharmacist will walk him through application if additional support is needed.  [name] seemed a little overwhelmed.  He could not recall latest BG readings.  I let him know we were available for support & to please return  call .   Patient Self Care Activities:  . Currently UNABLE TO independently manage diabetes . Attends all scheduled provider appointments  Please see past updates related to this goal by clicking on the "Past Updates" button in the selected goal         The patient verbalized understanding of instructions provided today and declined a print copy of patient instruction materials.   The CM team will reach out to the patient again over the next 5 days.   [name], PharmD, BCPS Clinical Pharmacist, [facility]  [facility]  Direct Dial: [phone]

## 2018-06-29 NOTE — Telephone Encounter (Signed)
In the future, please get time of day sugars were taken. Increase Humalog to 30 units in am and continue with 25 units at night. Pt and/or nurse needs to call with sugars on Thursday. thanks

## 2018-07-01 ENCOUNTER — Encounter (HOSPITAL_COMMUNITY): Payer: Self-pay | Admitting: Emergency Medicine

## 2018-07-01 ENCOUNTER — Telehealth: Payer: Self-pay

## 2018-07-01 ENCOUNTER — Emergency Department (HOSPITAL_COMMUNITY)
Admission: EM | Admit: 2018-07-01 | Discharge: 2018-07-02 | Disposition: A | Discharge status: Home / Self Care | Payer: Medicare Other | Attending: Emergency Medicine | Admitting: Emergency Medicine

## 2018-07-01 ENCOUNTER — Other Ambulatory Visit: Payer: Self-pay

## 2018-07-01 ENCOUNTER — Emergency Department (HOSPITAL_COMMUNITY): Payer: Medicare Other

## 2018-07-01 DIAGNOSIS — R2231 Localized swelling, mass and lump, right upper limb: Secondary | ICD-10-CM | POA: Diagnosis present

## 2018-07-01 DIAGNOSIS — R2242 Localized swelling, mass and lump, left lower limb: Secondary | ICD-10-CM | POA: Insufficient documentation

## 2018-07-01 DIAGNOSIS — R2241 Localized swelling, mass and lump, right lower limb: Secondary | ICD-10-CM | POA: Diagnosis not present

## 2018-07-01 DIAGNOSIS — E1122 Type 2 diabetes mellitus with diabetic chronic kidney disease: Secondary | ICD-10-CM | POA: Insufficient documentation

## 2018-07-01 DIAGNOSIS — N183 Chronic kidney disease, stage 3 (moderate): Secondary | ICD-10-CM | POA: Insufficient documentation

## 2018-07-01 DIAGNOSIS — I130 Hypertensive heart and chronic kidney disease with heart failure and stage 1 through stage 4 chronic kidney disease, or unspecified chronic kidney disease: Secondary | ICD-10-CM | POA: Insufficient documentation

## 2018-07-01 DIAGNOSIS — I509 Heart failure, unspecified: Secondary | ICD-10-CM | POA: Insufficient documentation

## 2018-07-01 DIAGNOSIS — M7989 Other specified soft tissue disorders: Secondary | ICD-10-CM

## 2018-07-01 DIAGNOSIS — Z79899 Other long term (current) drug therapy: Secondary | ICD-10-CM | POA: Insufficient documentation

## 2018-07-01 LAB — BASIC METABOLIC PANEL
Anion gap: 13 (ref 5–15)
BUN: 13 mg/dL (ref 8–23)
CO2: 22 mmol/L (ref 22–32)
Calcium: 9.6 mg/dL (ref 8.9–10.3)
Chloride: 98 mmol/L (ref 98–111)
Creatinine, Ser: 0.92 mg/dL (ref 0.44–1.00)
GFR calc Af Amer: >60 mL/min (ref >60)
GFR calc non Af Amer: 58 mL/min — ABNORMAL LOW (ref >60)
Glucose, Bld: 133 mg/dL — ABNORMAL HIGH (ref 70–99)
Potassium: 5.2 mmol/L — ABNORMAL HIGH (ref 3.5–5.1)
Sodium: 133 mmol/L — ABNORMAL LOW (ref 135–145)

## 2018-07-01 LAB — CBC
HCT: 30.2 % — ABNORMAL LOW (ref 36.0–46.0)
Hemoglobin: 9.4 g/dL — ABNORMAL LOW (ref 12.0–15.0)
MCH: 27.3 pg (ref 26.0–34.0)
MCHC: 31.1 g/dL (ref 30.0–36.0)
MCV: 87.8 fL (ref 80.0–100.0)
Platelets: 504 10*3/uL — ABNORMAL HIGH (ref 150–400)
RBC: 3.44 MIL/uL — ABNORMAL LOW (ref 3.87–5.11)
RDW: 15.2 % (ref 11.5–15.5)
WBC: 12.1 10*3/uL — ABNORMAL HIGH (ref 4.0–10.5)
nRBC: 0 % (ref 0.0–0.2)

## 2018-07-01 NOTE — ED Triage Notes (Signed)
[facility]- pt from home. Pt has swelling is both her arms and hands for a few days. Pt here due to PCP concerned for possible blood clot    140 systolic palpated CBG 200 HR 96 O2 97% 97.9 temp

## 2018-07-01 NOTE — Discharge Instructions (Addendum)
Work-up here tonight labs and chest x-ray without any significant abnormalities.  Your white blood cell count remains slightly up and has been up now for's several weeks.  Recommend follow-up with your primary care doctor return for any new or worse symptoms.  Leg swelling is equal bilaterally.  Oxygen levels are very normal.

## 2018-07-01 NOTE — Telephone Encounter (Signed)
I returned a call to the pt [phone] and to the physical therapist Dois Davenport with [facility] [phone] and left a message that the office was calling to schedule the pt a virtual appt to be evaluated for possible bilateral upper extremity DVT.

## 2018-07-01 NOTE — Telephone Encounter (Signed)
Left a message with the nurse that Dr. Allyne Gee wants Helen Richardson to take the pt to the ED to be evaluated because the physical therapist is concerned about the swelling in her upper extremities and they want to rule out DVT

## 2018-07-01 NOTE — ED Provider Notes (Signed)
[facility]   CSN: [id number] Arrival date & time: 07/01/18  1835    History   Chief Complaint No chief complaint on file.   HPI Helen Richardson is a 85 y.o. female.     Patient called EMS to bring her in.  Then was not certain what she called them for.  Initial notes for me as there was concerns about swelling in arms and legs for the past several days.  Chart review shows that during her last admission she definitely had swelling in her legs.  Patient denies any chest pain or shortness of breath.  Denies any fevers or cough and congestion.  Sounds as if maybe PCP was concerned about possible blood clot.  But the swelling is bilateral and equal in upper extremities and lower extremities.     Past Medical History:  Diagnosis Date  . CHF (congestive heart failure) (HCC)   . Diabetes mellitus without complication (HCC)   . High cholesterol   . HTN (hypertension)   . Obesity     Patient Active Problem List   Diagnosis Date Noted  . AKI (acute kidney injury) (HCC) 06/11/2018  . Sepsis due to gram-negative UTI (HCC) 06/11/2018  . Acute lower UTI (urinary tract infection) 06/10/2018  . HTN (hypertension) 06/10/2018  . Joint swelling 06/10/2018  . Hyperkalemia 06/10/2018  . Hyponatremia 06/10/2018  . Normocytic anemia 06/10/2018  . Type 2 diabetes mellitus with stage 3 chronic kidney disease, with long-term current use of insulin (HCC) 03/08/2018  . 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 Pure hypercholesterolemia 03/08/2018  . Class 3 severe obesity due to excess calories with serious comorbidity and body mass index (BMI) of 40.0 to 44.9 in adult [facility](HCC) 03/08/2018    Past Surgical History:  Procedure Laterality Date  . CATARACT EXTRACTION, BILATERAL    . ingrown toenail Left    left great toenail  . PARTIAL HYSTERECTOMY       OB History   No obstetric history on file.      Home Medications    Prior to  Admission medications   Medication Sig Start Date End Date Taking? Authorizing Provider  aspirin 81 MG chewable tablet Chew 81 mg by mouth at bedtime.   Yes [provider]  Calcium Carbonate-Vitamin D (CALTRATE 600+D PO) Take 1 tablet by mouth daily.   Yes [provider]  carvedilol (COREG) 6.25 MG tablet TAKE 1 TABLET BY MOUTH TWICE DAILY Patient taking differently: Take 6.25 mg by mouth 2 (two) times daily with a meal.  05/17/18  Yes [name], [name], MD  diclofenac sodium (VOLTAREN) 1 % GEL APPLY TO AFFECTED AREA 3 TIMES DAILY AS NEEDED Patient taking differently: Apply 2 g topically 3 (three) times daily as needed (pain).  06/16/18  Yes [name], [name], MD  furosemide (LASIX) 40 MG tablet Take 40 mg by mouth daily.  02/18/13  Yes [provider]  Insulin Lispro Prot & Lispro (HUMALOG MIX 75/25 KWIKPEN) (75-25) 100 UNIT/ML Kwikpen Inject 25 Units into the skin 2 (two) times daily before a meal. Patient taking differently: Inject 30 Units into the skin 2 (two) times daily before a meal.  06/22/18  Yes [name], [name], MD  polyethylene glycol powder (GLYCOLAX/MIRALAX) powder Mix 1 capful in drink and take by mouth 1 to 3 times daily as needed for daily soft stools  OTC 05/14/18  Yes Little, Ambrose Finlandachel Morgan, MD  potassium chloride SA (K-DUR,KLOR-CON) 20 MEQ tablet TAKE 1 TABLET  BY MOUTH TWICE A DAY Patient taking differently: Take 20 mEq by mouth 2 (two) times daily.  06/14/18  Yes Dorothyann Peng, MD  rosuvastatin (CRESTOR) 20 MG tablet TAKE 1 TABLET BY MOUTH EVERY DAY Patient taking differently: Take 20 mg by mouth daily.  06/29/18  Yes Dorothyann Peng, MD  telmisartan (MICARDIS) 80 MG tablet TAKE 1 TABLET BY MOUTH ONCE DAILY Patient taking differently: Take 80 mg by mouth daily.  05/17/18  Yes Dorothyann Peng, MD  TRADJENTA 5 MG TABS tablet TAKE 1 TABLET BY MOUTH ONCE DAILY Patient taking differently: Take 5 mg by mouth daily.  03/24/18  Yes Dorothyann Peng, MD  ADVOCATE LANCETS  MISC  01/04/13   [provider]  Alcohol Swabs (ALCOHOL PREP) 70 % PADS  02/09/13   [provider]  COMFORT EZ PEN NEEDLES 32G X 4 MM MISC  08/21/17   [provider]  Lancet Devices (ADVOCATE LANCING DEVICE) MISC  02/09/13   [provider]    Family History Family History  Problem Relation Age of Onset  . Diabetes Mother   . Hypertension Mother   . Hypertension Father   . Stroke Father     Social History Social History   Tobacco Use  . Smoking status: Never Smoker  . Smokeless tobacco: Never Used  Substance Use Topics  . Alcohol use: No  . Drug use: No     Allergies   Patient has no known allergies.   Review of Systems Review of Systems  Constitutional: Negative for chills and fever.  HENT: Negative for rhinorrhea and sore throat.   Eyes: Negative for visual disturbance.  Respiratory: Negative for cough and shortness of breath.   Cardiovascular: Positive for leg swelling. Negative for chest pain.  Gastrointestinal: Negative for abdominal pain, diarrhea, nausea and vomiting.  Genitourinary: Negative for dysuria.  Musculoskeletal: Negative for back pain and neck pain.  Skin: Negative for rash.  Neurological: Negative for dizziness, light-headedness and headaches.  Hematological: Does not bruise/bleed easily.  Psychiatric/Behavioral: Positive for confusion.     Physical Exam Updated Vital Signs BP (!) 105/91   Pulse 99   Temp 98.8 F (37.1 C) (Oral)   Resp (!) 25   Ht 1.6 m (5\' 3" )   Wt 90.7 kg   SpO2 100%   BMI 35.43 kg/m   Physical Exam Vitals signs and nursing note reviewed.  Constitutional:      General: She is not in acute distress.    Appearance: She is well-developed.  HENT:     Head: Normocephalic and atraumatic.     Mouth/Throat:     Mouth: Mucous membranes are moist.  Eyes:     Extraocular Movements: Extraocular movements intact.     Conjunctiva/sclera: Conjunctivae normal.     Pupils: Pupils are  equal, round, and reactive to light.  Neck:     Musculoskeletal: Normal range of motion and neck supple.  Cardiovascular:     Rate and Rhythm: Regular rhythm. Tachycardia present.     Heart sounds: No murmur.  Pulmonary:     Effort: Pulmonary effort is normal. No respiratory distress.     Breath sounds: Normal breath sounds.  Abdominal:     Palpations: Abdomen is soft.     Tenderness: There is no abdominal tenderness.  Musculoskeletal: Normal range of motion.        General: Swelling present.     Comments: Very slight pitting edema to both lower extremities equal bilaterally.  Little bit of swelling  without pitting edema to both upper extremities.  Skin:    General: Skin is warm and dry.     Capillary Refill: Capillary refill takes less than 2 seconds.  Neurological:     General: No focal deficit present.     Mental Status: She is alert. Mental status is at baseline.     Comments: Patient has a degree of some confusion but she is alert will follow commands.  And you can converse with her.      ED Treatments / Results  Labs (all labs ordered are listed, but only abnormal results are displayed) Labs Reviewed  CBC - Abnormal; Notable for the following components:      Result Value   WBC 12.1 (*)    RBC 3.44 (*)    Hemoglobin 9.4 (*)    HCT 30.2 (*)    Platelets 504 (*)    All other components within normal limits  BASIC METABOLIC PANEL - Abnormal; Notable for the following components:   Sodium 133 (*)    Potassium 5.2 (*)    Glucose, Bld 133 (*)    GFR calc non Af Amer 58 (*)    All other components within normal limits    EKG None  Radiology Dg Chest Port 1 View  Result Date: 07/01/2018 CLINICAL DATA:  Leg swelling, swelling in arms and hands for few days, history CHF, diabetes mellitus, hypertension EXAM: PORTABLE CHEST 1 VIEW COMPARISON:  Portable exam 2155 hours compared to 06/10/2018 FINDINGS: Normal heart size, mediastinal contours, and pulmonary vascularity.  Lungs clear. No infiltrate, pleural effusion or pneumothorax. Bones unremarkable. IMPRESSION: No acute abnormalities. Electronically Signed   By: Ulyses Southward M.D.   On: 07/01/2018 22:08    Procedures Procedures (including critical care time)  Medications Ordered in ED Medications - No data to display   Initial Impression / Assessment and Plan / ED Course  I have reviewed the triage vital signs and the nursing notes.  Pertinent labs & imaging results that were available during my care of the patient were reviewed by me and considered in my medical decision making (see chart for details).       Work-up for the upper extremity lower extremity swelling without any acute findings.  Chest x-ray without any evidence of fluid.  Labs normal white count is elevated but has been elevated for few weeks now.  Patient without fever.  Eat heart monitor with slight tachycardia in the right around 100.  But oxygen saturations are excellent on room air in the upper 90s.  Patient in no acute distress.  Patient stable for discharge home and follow-up with primary care doctor.  This extremity swelling is equal in bilateral do not feels consistent with a DVT in extremities.  Patient nontoxic no acute distress.   Final Clinical Impressions(s) / ED Diagnoses   Final diagnoses:  Leg swelling    ED Discharge Orders    None       Vanetta Mulders, MD 07/01/18 2354

## 2018-07-02 ENCOUNTER — Telehealth: Payer: Self-pay

## 2018-07-02 NOTE — Telephone Encounter (Signed)
I returned the pt's phone call and notified her that I left a message yesterday with the cna that was with her yesterday afternoon that Dr. Allyne Gee wanted the pt to go to the ER to be evaluated because the pt has swelling in her arms and  that the physical therapist said her arms feel warm to the touch and that the RNs Bonita Quin and Dois Davenport with [facility] said the pt's arms hurt her so bad that the pt can barely eat.  The pt said that her arms feel a little better today and that she will have someone take her to the ER.

## 2018-07-05 ENCOUNTER — Ambulatory Visit: Payer: Self-pay

## 2018-07-05 ENCOUNTER — Telehealth: Payer: Medicare Other

## 2018-07-05 DIAGNOSIS — E1122 Type 2 diabetes mellitus with diabetic chronic kidney disease: Secondary | ICD-10-CM

## 2018-07-05 DIAGNOSIS — Z794 Long term (current) use of insulin: Principal | ICD-10-CM

## 2018-07-05 DIAGNOSIS — N183 Chronic kidney disease, stage 3 unspecified: Secondary | ICD-10-CM

## 2018-07-05 DIAGNOSIS — R600 Localized edema: Secondary | ICD-10-CM

## 2018-07-05 DIAGNOSIS — I129 Hypertensive chronic kidney disease with stage 1 through stage 4 chronic kidney disease, or unspecified chronic kidney disease: Secondary | ICD-10-CM

## 2018-07-05 NOTE — Chronic Care Management (AMB) (Signed)
  Chronic Care Management    Clinical Social Work Follow Up Note  07/05/2018 Name: Helen Richardson MRN: [id number] DOB: 1933-06-14  Helen Richardson is a 85 y.o. year old female who is a primary care patient of Dorothyann Peng, MD. The CCM team was consulted for assistance with [name].   Review of patient status, including review of consultants reports, other relevant assessments, and collaboration with appropriate care team members and the patient's provider was performed as part of comprehensive patient evaluation and provision of chronic care management services.    I placed an unsuccessful call to the patients grandson [name] in effort to assess the progress of patient goals. Unfortunately, [name] does not have a Designer, fashion/clothing established which prohibited this Clinical research associate from requesting a return call.   Goals Addressed            This Visit's Progress     Patient Stated   . "We need help with home repairs" (pt-stated)   Not on track    Grandson stated  Current Barriers:  . Financial constraints . Lacks knowledge of community resource: community housing solutions  Clinical Social Work Clinical Goal(s):  [name] Kitchen Over the next 60 days, client will work with SW to address concerns related to home modifications  Interventions: . Telephonic attempt to the patients grand son [name]) to assess progress of referral . Collaboration with [employer] with [employer] via secure e-mail to follow up on the status of the patients referral. CCM SW has yet to receive an update since last attempt to receive  Patient Self Care Activities:  . Attends all scheduled provider appointments . Calls pharmacy for medication refills . Calls provider office for new concerns or questions   Please see past updates related to this goal by clicking on the "Past Updates" button in the selected goal        Follow Up Plan: SW will follow up with patient by phone over the next 7-14 days    Bevelyn Ngo, [place], CDP [facility] Care Management Social Worker [phone]  Total time spent performing care coordination and/or care management activities with the patient by phone or face to face = 8 minutes.

## 2018-07-05 NOTE — Patient Instructions (Signed)
Social Worker Visit Information  Goals we discussed today:  Goals Addressed            This Visit's Progress     Patient Stated   . "We need help with home repairs" (pt-stated)   Not on track    Grandson stated  Current Barriers:  . Financial constraints . Lacks knowledge of community resource: community housing solutions  Clinical Social Work Clinical Goal(s):  [name] Kitchen Over the next 60 days, client will work with SW to address concerns related to home modifications  Interventions: . Telephonic attempt to the patients grand son [name]) to assess progress of referral . Collaboration with [employer] with [employer] via secure e-mail to follow up on the status of the patients referral. CCM SW has yet to receive an update since last attempt to receive  Patient Self Care Activities:  . Attends all scheduled provider appointments . Calls pharmacy for medication refills . Calls provider office for new concerns or questions   Please see past updates related to this goal by clicking on the "Past Updates" button in the selected goal          Materials Provided: No. Patient not reached.  Follow Up Plan: SW will follow up with patient by phone over the next 7-14 days   Bevelyn Ngo, BSW, CDP [facility] Care Management Social Worker [phone]

## 2018-07-07 ENCOUNTER — Telehealth: Payer: Self-pay | Admitting: Internal Medicine

## 2018-07-07 ENCOUNTER — Ambulatory Visit: Payer: Medicare Other | Admitting: Podiatry

## 2018-07-07 ENCOUNTER — Telehealth: Payer: Self-pay

## 2018-07-07 NOTE — Telephone Encounter (Signed)
PT GRANDSON CALLED STATED THAT SHE IS HAVING ALLERGIC REACTION TO TRAMADOL CAUSING HER HANDS AND FEET TO SWELL, STATED THAT HE WILL DISCONTINUE THE MED. GRANDSON ALSO WANTED TO CANCEL 5/11 APPT BECAUSE PT DOES NOT WANT TO COME OUT SHE HAS CANCELLED ALL OTHER APPTS W/OTHER OFFICES.

## 2018-07-07 NOTE — Telephone Encounter (Signed)
I returned the pt's grandson Brandon's call.  He said he was calling because he was told that some of the pt's meds could be causing her to have some swelling.  The voicemail was unable to receive messages.

## 2018-07-08 ENCOUNTER — Telehealth: Payer: Medicare Other

## 2018-07-09 ENCOUNTER — Telehealth: Payer: Self-pay

## 2018-07-09 NOTE — Telephone Encounter (Signed)
Called to see if pt still needed the hand/wrist orthosis. No answer left message

## 2018-07-12 ENCOUNTER — Telehealth: Payer: Self-pay

## 2018-07-12 ENCOUNTER — Other Ambulatory Visit: Payer: Self-pay

## 2018-07-12 ENCOUNTER — Other Ambulatory Visit: Payer: Self-pay | Admitting: Nurse Practitioner

## 2018-07-12 ENCOUNTER — Ambulatory Visit: Payer: Medicare Other | Admitting: Internal Medicine

## 2018-07-12 ENCOUNTER — Ambulatory Visit: Payer: Medicare Other | Admitting: Nurse Practitioner

## 2018-07-12 ENCOUNTER — Encounter: Payer: Self-pay | Admitting: Nurse Practitioner

## 2018-07-12 VITALS — BP 120/84 | HR 65 | Temp 97.8°F | Ht 63.0 in | Wt 193.4 lb

## 2018-07-12 DIAGNOSIS — M2550 Pain in unspecified joint: Secondary | ICD-10-CM

## 2018-07-12 DIAGNOSIS — E1122 Type 2 diabetes mellitus with diabetic chronic kidney disease: Secondary | ICD-10-CM

## 2018-07-12 DIAGNOSIS — I129 Hypertensive chronic kidney disease with stage 1 through stage 4 chronic kidney disease, or unspecified chronic kidney disease: Secondary | ICD-10-CM | POA: Diagnosis not present

## 2018-07-12 DIAGNOSIS — N183 Chronic kidney disease, stage 3 unspecified: Secondary | ICD-10-CM

## 2018-07-12 DIAGNOSIS — M25511 Pain in right shoulder: Secondary | ICD-10-CM

## 2018-07-12 DIAGNOSIS — Z794 Long term (current) use of insulin: Secondary | ICD-10-CM

## 2018-07-12 DIAGNOSIS — I10 Essential (primary) hypertension: Secondary | ICD-10-CM

## 2018-07-12 MED ORDER — TRAMADOL HCL 50 MG PO TABS: 50.0000 mg | ORAL_TABLET | Freq: Four times a day (QID) | ORAL | 0 refills | Status: AC | PRN

## 2018-07-12 NOTE — Progress Notes (Addendum)
Subjective:     Patient ID: Helen Richardson , female    DOB: 05/23/1933 , 85 y.o.   MRN: [id number]   Chief Complaint  Patient presents with  . Diabetes  . Hypertension    HPI  Diabetes  She presents for her follow-up diabetic visit. She has type 2 diabetes mellitus. Her disease course has been stable. There are no hypoglycemic associated symptoms. Pertinent negatives for diabetes include no chest pain. Symptoms are stable.  Hypertension  This is a chronic problem. The current episode started more than 1 year ago. The problem is unchanged. The problem is controlled. Pertinent negatives include no anxiety or chest pain. Risk factors for coronary artery disease include diabetes mellitus, sedentary lifestyle and obesity. Past treatments include beta blockers and diuretics. The current treatment provides moderate improvement. There are no compliance problems.  There is no history of chronic renal disease.  Arm Pain   The incident occurred more than 1 week ago. Pain location: bilateral arms. The quality of the pain is described as aching. The pain does not radiate. The pain is at a severity of 8/10. Pertinent negatives include no chest pain or numbness. She has tried nothing for the symptoms. The treatment provided no relief.     Past Medical History:  Diagnosis Date  . CHF (congestive heart failure) (HCC)   . Diabetes mellitus without complication (HCC)   . High cholesterol   . HTN (hypertension)   . Obesity      Family History  Problem Relation Age of Onset  . Diabetes Mother   . Hypertension Mother   . Hypertension Father   . Stroke Father      Current Outpatient Medications:  .  ADVOCATE LANCETS MISC, , Disp: , Rfl:  .  Alcohol Swabs (ALCOHOL PREP) 70 % PADS, , Disp: , Rfl:  .  aspirin 81 MG chewable tablet, Chew 81 mg by mouth at bedtime., Disp: , Rfl:  .  Calcium Carbonate-Vitamin D (CALTRATE 600+D PO), Take 1 tablet by mouth daily., Disp: , Rfl:  .  carvedilol (COREG)  6.25 MG tablet, TAKE 1 TABLET BY MOUTH TWICE DAILY (Patient taking differently: Take 6.25 mg by mouth 2 (two) times daily with a meal. ), Disp: 60 tablet, Rfl: 4 .  COMFORT EZ PEN NEEDLES 32G X 4 MM MISC, , Disp: , Rfl: 5 .  diclofenac sodium (VOLTAREN) 1 % GEL, APPLY TO AFFECTED AREA 3 TIMES DAILY AS NEEDED (Patient taking differently: Apply 2 g topically 3 (three) times daily as needed (pain). ), Disp: 100 g, Rfl: 1 .  furosemide (LASIX) 40 MG tablet, Take 40 mg by mouth daily. , Disp: , Rfl:  .  Insulin Lispro Prot & Lispro (HUMALOG MIX 75/25 KWIKPEN) (75-25) 100 UNIT/ML Kwikpen, Inject 25 Units into the skin 2 (two) times daily before a meal. (Patient taking differently: Inject 30 Units into the skin 2 (two) times daily before a meal. ), Disp: 5 pen, Rfl: 0 .  Lancet Devices (ADVOCATE LANCING DEVICE) MISC, , Disp: , Rfl:  .  potassium chloride SA (K-DUR,KLOR-CON) 20 MEQ tablet, TAKE 1 TABLET BY MOUTH TWICE A DAY (Patient taking differently: Take 20 mEq by mouth 2 (two) times daily. ), Disp: 60 tablet, Rfl: 5 .  rosuvastatin (CRESTOR) 20 MG tablet, TAKE 1 TABLET BY MOUTH EVERY DAY (Patient taking differently: Take 20 mg by mouth daily. ), Disp: 90 tablet, Rfl: 0 .  telmisartan (MICARDIS) 80 MG tablet, TAKE 1 TABLET BY MOUTH ONCE  DAILY (Patient taking differently: Take 80 mg by mouth daily. ), Disp: 30 tablet, Rfl: 4 .  TRADJENTA 5 MG TABS tablet, TAKE 1 TABLET BY MOUTH ONCE DAILY (Patient taking differently: Take 5 mg by mouth daily. ), Disp: 30 tablet, Rfl: 3   No Known Allergies   Review of Systems  Cardiovascular: Negative for chest pain.  Neurological: Negative for numbness.     Today's Vitals   07/12/18 1203  BP: 120/84  Pulse: 65  Temp: 97.8 F (36.6 C)  TempSrc: Oral  Weight: 193 lb 6.4 oz (87.7 kg)  Height: 5\' 3"  (1.6 m)  PainSc: 0-No pain   Body mass index is 34.26 kg/m.   Objective:  Physical Exam Constitutional:      Appearance: Normal appearance.  Cardiovascular:      Rate and Rhythm: Normal rate and regular rhythm.     Pulses: Normal pulses.     Heart sounds: Normal heart sounds. No murmur.  Musculoskeletal:        General: Swelling (right hand swelling) and tenderness (right shoulder pain and decreased ROM) present.  Skin:    General: Skin is warm and dry.     Capillary Refill: Capillary refill takes less than 2 seconds.  Neurological:     General: No focal deficit present.     Mental Status: She is alert and oriented to person, place, and time.  Psychiatric:        Mood and Affect: Mood normal.        Behavior: Behavior normal.        Thought Content: Thought content normal.        Judgment: Judgment normal.         Assessment And Plan:    1. Arthralgia, unspecified joint  Right shoulder pain and stiffness. 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  Will treat with limited amount of tramadol and advised to make sure she is not trying to ambulate when taking this medication unless family is present.  I feel the shoulder issue is causing her swelling to her right hand and arm  I have advised them to call the orthopedic to schedule an appt - Autoimmune Profile - XR Shoulder Right - traMADol (ULTRAM) 50 MG tablet; Take 1 tablet (50 mg total) by mouth every 6 (six) hours as needed.  Dispense: 20 tablet; Refill: 0  2. Type 2 diabetes mellitus with stage 3 chronic kidney disease, with long-term current use of insulin (HCC)  Patients would benefit from having a Free Style Libre due to poor dexterity this will allow her to manage her diabetes more effectively Chronic, slightly uncontrolled Continue with current medications Encouraged to limit intake of sugary foods and drinks Encouraged to increase physical activity to 150 minutes per week  3. Essential hypertension B/P is controlled. .  The importance of  dietary modification was stressed to the patient.    Arnette Felts,  FNP    THE PATIENT IS ENCOURAGED TO PRACTICE SOCIAL DISTANCING DUE TO THE COVID-19 PANDEMIC.

## 2018-07-13 LAB — AUTOIMMUNE PROFILE
Anti Nuclear Antibody (ANA): NEGATIVE
Complement C3, Serum: 178 mg/dL — ABNORMAL HIGH (ref 82–167)
dsDNA Ab: <1 IU/mL (ref 0–9)

## 2018-07-13 LAB — HEMOGLOBIN A1C
Est. average glucose Bld gHb Est-mCnc: 246 mg/dL
Hgb A1c MFr Bld: 10.2 % — ABNORMAL HIGH (ref 4.8–5.6)

## 2018-07-14 ENCOUNTER — Other Ambulatory Visit: Payer: Self-pay | Admitting: Internal Medicine

## 2018-07-14 ENCOUNTER — Telehealth: Payer: Self-pay

## 2018-07-14 ENCOUNTER — Ambulatory Visit: Payer: Self-pay

## 2018-07-14 ENCOUNTER — Telehealth: Payer: Medicare Other

## 2018-07-14 ENCOUNTER — Other Ambulatory Visit: Payer: Self-pay

## 2018-07-14 ENCOUNTER — Ambulatory Visit (INDEPENDENT_AMBULATORY_CARE_PROVIDER_SITE_OTHER): Payer: Medicare Other

## 2018-07-14 DIAGNOSIS — N3000 Acute cystitis without hematuria: Secondary | ICD-10-CM

## 2018-07-14 DIAGNOSIS — E1122 Type 2 diabetes mellitus with diabetic chronic kidney disease: Secondary | ICD-10-CM | POA: Diagnosis not present

## 2018-07-14 DIAGNOSIS — M2550 Pain in unspecified joint: Secondary | ICD-10-CM

## 2018-07-14 DIAGNOSIS — N183 Chronic kidney disease, stage 3 unspecified: Secondary | ICD-10-CM

## 2018-07-14 DIAGNOSIS — R600 Localized edema: Secondary | ICD-10-CM

## 2018-07-14 DIAGNOSIS — Z794 Long term (current) use of insulin: Secondary | ICD-10-CM

## 2018-07-14 DIAGNOSIS — N179 Acute kidney failure, unspecified: Secondary | ICD-10-CM

## 2018-07-14 NOTE — Chronic Care Management (AMB) (Signed)
  Chronic Care Management   Outreach Note  07/14/2018 Name: Helen Richardson MRN: [id number] DOB: Sep 20, 1933  Referred by: Dorothyann Peng, MD Reason for referral : Care Coordination   A second unsuccessful telephone outreach was attempted today. The patient was referred to the case management team for assistance with chronic care management and care coordination.   Follow Up Plan: CCM SW unable to leave a voice message due to no established voice mailbox. CCM SW collaborated with CCM RN Case Manager to discuss barriers with patient engagement. CCM SW will not plan a future call at this time. CCM SW will collaborate with RN Case Manager regarding ongoing patient enrollment after her next outreach call.  Bevelyn Ngo, BSW, CDP [facility] / [facility] Care Management Social Worker [phone]  Total time spent performing care coordination and/or care management activities with the patient by phone or face to face = 12 minutes.

## 2018-07-14 NOTE — Progress Notes (Signed)
Thank you :)

## 2018-07-15 ENCOUNTER — Telehealth: Payer: Self-pay

## 2018-07-15 ENCOUNTER — Telehealth: Payer: Medicare Other

## 2018-07-15 NOTE — Chronic Care Management (AMB) (Signed)
Chronic Care Management   Follow Up Note   07/14/2018 Name: Helen Richardson MRN: [id number] DOB: 03/25/1933  Referred by: Glendale Chard, MD Reason for referral : Chronic Care Management (CCM RN Telephone Follow Up )   Helen Richardson is a 85 y.o. year old female who is a primary care patient of Glendale Chard, MD. The CCM team was consulted for assistance with chronic disease management and care coordination needs.    Review of patient status, including review of consultants reports, relevant laboratory and other test results, and collaboration with appropriate care team members and the patient's provider was performed as part of comprehensive patient evaluation and provision of chronic care management services.    I spoke with Mr. Berneda Piccininni and caregiver Ivory Broad by telephone today.   Goals Addressed      Patient Stated   . "Her blood sugars are running around 200 in the morning before breakfast" (pt-stated)   On track    Grandson stated  Current Barriers:  [name] Kitchen Knowledge Deficits related to Diabetes and Self Health Management   Nurse Case Manager Clinical Goal(s):  [name] Kitchen Over the next 90 days, patient/caregiver will verbalize basic understanding of Diabetes disease process and Self Health Management plan as evidenced by grandson Erlene Quan will verbalize increased understanding of the disease process for Diabetes including how to meal plan, carb counting, signs/symptoms of hypo/hyperglycemic events. 07/15/18-target Goal date extended out to 90 days  Interventions:   Completed a CCM RN Telephone Follow Up with grandson Taresa Montville and patient; [name] gave verbal consent to speak with caregiver Ivory Broad . Comprehensive medication review performed . Advised patient to continue checking BGs, take medications as prescribed - caregiver [place] reports pt continues to have low's (70's) and high's (300's) . Discussed plans with patient for ongoing care management follow up and  provided patient with direct contact information for care management team . Collaboration with embedded Pharm D Lottie Dawson re: unstable BGs reported by caregiver   Confirmed grandson Erlene Quan has RNCM contact # and hours of nurse availability  Scheduled a follow up call with Erlene Quan and caregiver [place]  Patient Self Care Activities:   [name] understanding of the information and education provided  Patient is currently UNABLE TO independently provide self-care or self administer medications . Attends all scheduled provider appointments (grandson and family are providing transportation) . Grandson or caregiver [place] calls pharmacy for medication refills . Needs assistance with ADL's independently . Needs assistance with IADL's independently . [name] calls provider office for new concerns or questions  Please see past updates related to this goal by clicking on the "Past Updates" button in the selected goal     . "We need help with home repairs" (pt-stated)   Not on track    Grandson stated  Current Barriers:  . Financial constraints . Lacks knowledge of community resource: community housing solutions  Clinical Social Work Clinical Goal(s):  [name] Kitchen Over the next 60 days, client will work with SW to address concerns related to home modifications  Interventions:  Completed a CCM RN Telephone Follow Up with grandson Neftaly Inzunza and patient; [name] gave verbal consent to speak with caregiver [place] for collaboration with Judeth Cornfield from [employer] states they have not been notified by anyone from [employer] as of yet  Collaboration with [employer] with status update and notification that no one from [employer] has contacted Mr. Germani re: the  home modifications  Scheduled a follow up call with caregiver Ivory Broad per consent from [name]  Patient Self Care Activities:   . Attends all scheduled provider appointments . Calls pharmacy for medication refills . Calls provider office for new concerns or questions   Please see past updates related to this goal by clicking on the "Past Updates" button in the selected goal     . "We need more help in the home" (pt-stated)   On track    Grandson and son stated:  Current Barriers:  . Financial constraints . Limited education about community resources and insurance benefits . Inability to perform ADL's independently . Inability to perform IADL's independently  Clinical Social Work Clinical Goal(s):  [name] Kitchen Over the next 45 days, client will work with SW to address concerns related to obtaining a caregiver . Over the next 10 days, client will follow up with DSS to complete a Medicaid application as directed by SW  Interventions:  Completed a CCM RN Telephone Follow Up with grandson Kaleah Hagemeister and patient; [name] gave verbal consent to speak with caregiver Ivory Broad  Assessed if the patient had been visited by SW from [place] [facility] to assist with completion of Medicaid application - [name] and caregiver Caryl Comes confirm the Medicaid application was completed and mailed on 07/05/18  Discussed that [name] will notify the CCM team once he receives notification with a determination  Collaboration with embedded BSW Daneen Schick re: status update for the Medicaid application  Scheduled a follow up call with grandson Erlene Quan and caregiver [place]  Patient Self Care Activities:  . Currently UNABLE TO independently perform iADL's and ADL's  . Relies on family members to assist with care needs  Please see past updates related to this goal by clicking on the "Past Updates" button in the selected goal     . COMPLETED: "We want back-up trnsportation options" (pt-stated)       Grandson Stated:  Current Barriers:  . Limited social support . ADL IADL limitations . Lacks knowledge of community  resource: [employer]  Clinical Social Work Clinical Goal(s):  [name] Kitchen Over the next 30 days, client will work with SW to address concerns related to transportation resources  Interventions:  Completed a CCM RN Telephone Follow Up with grandson Shalisha Clausing and patient; Ms. Warning gave verbal consent to speak with caregiver [place] application has not been received-Brandon states his cousin was asked to mail the application and he is unsure if she took care of it; he will ask her and mail if not  Discussed with Erlene Quan that the family is actually satisfied with the current arrangements they have in place for transportation and will plan to keep as it for now  Discussed he will let Daneen Schick BSW or myself know if they need to re-visit exploring options for transportation   Collaboration with [employer] with an update re: transportation and grandson's decision to keep transportation as is for now  Goal Met   Patient Self Care Activities:  . Currently UNABLE TO independently drive self   Please see past updates related to this goal by clicking on the "Past Updates" button in the selected goal     . I don't know if insulin regimen is working (pt-stated)   Not on track    Current Barriers:  [name] Kitchen Knowledge Deficits related to managing diabetes  Pharmacist Clinical Goal(s):  [name] Kitchen Over the next 30 days, patient will  demonstrate Improved medication adherence as evidenced by BGs within range, FBG<130, no hypoglycemia . Over the next 30 days, patient will work with CCM team to address needs related to managing diabetes  Interventions:  Completed a CCM RN Telephone Follow Up with grandson Lauriel Helin and patient; Ms. Ziegler gave verbal consent to speak with caregiver Ivory Broad . Comprehensive medication review performed . Advised patient to continue checking BGs, take medications as prescribed - caregiver [place] reports pt continues to have low's (70's) and high's  (300's) . Discussed plans with patient for ongoing care management follow up and provided patient with direct contact information for care management team . Collaboration with embedded Pharm D Lottie Dawson re: unstable BGs reported by caregiver   Patient Self Care Activities:  . Currently UNABLE TO independently manage diabetes; chronic conditions  Please see past updates related to this goal by clicking on the "Past Updates" button in the selected goal       Other   . "Her pain is so bad from her arthritis"   Not on track    Current Barriers:  [name] Kitchen Knowledge Deficits related to pain management for Arthritis  Nurse Case Manager Clinical Goal(s):  [name] Kitchen Over the next 30 days, patient will demonstrate a decrease in Pain exacerbations as evidenced by patient will exhibit increased and improved physical mobility and will be able to particpate in PT  Interventions:   Completed a CCM RN Telephone Follow Up with grandson Shaylynne Lunt and patient; Ms. Heinle gave verbal consent to speak with caregiver Ivory Broad . Evaluation of current treatment plan related to pain management for arthritis and patient's adherence to plan as established by provider. . Advised patient to take Tramadol starting asap, exactly as directed for best benefits of pain relief . Reviewed medications with patient and discussed Rx for Tramadol, including indication, dosage and frequency for pain mangement   Patient Self Care Activities:   Patient is currently UNABLE TO independently provide self-care or self administer medications . Attends all scheduled provider appointments (grandson and family are providing transportation) . Grandson or caregiver [place] calls pharmacy for medication refills . Needs assistance with ADL's independently . Needs assistance with IADL's independently . [name] calls provider office for new concerns or questions  Initial goal documentation    . "I think my grandmother has CHF"   On  track    Grandson stated:   Current Barriers:  [name] Kitchen Knowledge Deficits related to cause for lower leg edema and recent fall   Nurse Case Manager Clinical Goal(s):  [name] Kitchen Over the next 90 days, patient and grandson Evelisse Szalkowski will work with the CCM team to address care coordination needs and disease specific education needs for health related comorbidies and how to self manage. 07/15/18-Target goal date changed to 90 days  Interventions:   Completed a CCM RN Telephone Follow Up with grandson Adrea Sherpa and patient; Ms. Mariner gave verbal consent to speak with caregiver [place] for caregiver's knowledge and understanding of disease process for CHF  Assessed for understanding of Self Health Management, checking daily weights after voiding, using the same weight scales everyday, at the same time of the day each morning  Advised grandson/caregiver, providing education and rationale, to weigh daily and record, calling PCP, HHA and or CCM team for weight gain of 3lbs overnight or 5 pounds in a week  Provided verbal education related to other s/s suggestive of CHF and reinforced when to call the doctor/HHA/CCM team if  symptoms occur  Mailed printed CHF educational materials for family/caregiver to review  Reviewed medications with caregiver and confirmed patient is taking diuretic exactly as prescribed  Collaboration with Dr. Baird Cancer via in basket message providing patient update  Patient Self Care Activities:  Floreen Comber and caregiver [place] verbalizes understanding of the education/information provided during today's call   Patient is currently UNABLE TO independently provide self-care or self administer medications . Attends all scheduled provider appointments (grandson and family are providing transportation) . Grandson or caregiver [place] calls pharmacy for medication refills . Needs assistance with ADL's independently . Needs assistance with IADL's  independently . [name] calls provider office for new concerns or questions  Please see past updates related to this goal by clicking on the "Past Updates" button in the selected goal     . "I would like a Palliative Care Referral"       Current Barriers:  [name] Kitchen Knowledge Deficits related to availbility and purpose of Palliative Care  Nurse Case Manager Clinical Goal(s):  [name] Kitchen Over the next 30 days, patient will verbalize understanding of plan for initiating a Palliative Care Referral for advanced care planning  Interventions:   Completed a CCM RN Telephone Follow Up with grandson Dajon Rowe and patient; Ms. Durflinger gave verbal consent to speak with caregiver Ivory Broad . Provided education to patient re: Palliative Care Services and process for sending a referral . Collaborated with Dr. Baird Cancer regarding need for a Palliative Care Referral for advanced care planning  . Discussed plans with patient for ongoing care management follow up and provided patient with direct contact information for care management team  Patient Self Care Activities:   Patient is currently UNABLE TO independently provide self-care or self administer medications . Attends all scheduled provider appointments (grandson and family are providing transportation) . Grandson or caregiver [place] calls pharmacy for medication refills . Needs assistance with ADL's independently . Needs assistance with IADL's independently . [name] calls provider office for new concerns or questions  Initial goal documentation     . COMPLETED: "My grandmother's had a couple of falls"       Grandson stated:  Current Barriers:  [name] Kitchen Knowledge Deficits related to falls and prevention  . Impaired Physical Mobility . Inability to access appropriate DME in the home due to aged architecture structures of home  Nurse Case Manager Clinical Goal(s):  [name] Kitchen Over the next 30 days, patient and caregivers will verbalize understanding  of plan for in home SN and PT services.  Interventions:   Completed a CCM RN Telephone Follow Up with grandson Kaveri Perras and patient; Ms. Hawbaker gave verbal consent to speak with caregiver Ivory Broad  Confirmed in home SNV/PT/OT are in place through [facility] for questions/concerns related to in home services  Goal completed    Patient Self Care Activities:   [name] understanding of the information and education provided . Currently UNABLE TO independently provide self-care  . Self administers medications as prescribed (grandson is preparing all meds using a pill bed) . Attends all scheduled provider appointments (grandson and family are providing transportation) . Grandson calls pharmacy for medication refills . Needs assistance with ADL's independently . Needs assistance with IADL's independently . [name] calls provider office for new concerns or questions  Please see past updates related to this goal by clicking on the "Past Updates" button in the selected goal     . High Risk for readmission  Current Barriers:  [name] Kitchen Knowledge Deficits related to prevention of UTI and dehyration  Nurse Case Manager Clinical Goal(s):  [name] Kitchen Over the next 30 days, patient will not experience hospital admission. Hospital Admissions in last 6 months = 3  Interventions:   Completed a CCM RN Telephone Follow Up with grandson Lanika Colgate and patient; Ms. Renn gave verbal consent to speak with caregiver Ivory Broad . Evaluation of current treatment plan related to recurrent UTI's and patient's adherence to plan as established by provider. . Advised caregiver to encourage patient to urinate when she feels the urge; discussed urine should be clear yellow; discussed patient should be urinating 5-6 times per day . Provided education to patient re: importance of keeping patient well hydrated, with water, sugar free popsicles and or sugar free jello, and or low  sodium broth; to keep kidneys working well and to help prevent a recurrent UTI; suggested using a straw and have patient take sips throughout the day . Discussed plans with patient for ongoing care management follow up and provided patient with direct contact information for care management team  Patient Self Care Activities:   Patient is currently UNABLE TO independently provide self-care or self administer medications . Attends all scheduled provider appointments (grandson and family are providing transportation) . Grandson or caregiver [place] calls pharmacy for medication refills . Needs assistance with ADL's independently . Needs assistance with IADL's independently . [name] calls provider office for new concerns or questions  Initial goal documentation        The CCM team will reach out to the patient again over the next 7-10 days.    Barb Merino, RN,CCM Care Management Coordinator [facility]  Direct Phone: [phone]

## 2018-07-15 NOTE — Patient Instructions (Signed)
Visit Information  Goals Addressed      Patient Stated   . "Her blood sugars are running around 200 in the morning before breakfast" (pt-stated)   On track    Grandson stated  Current Barriers:  [name] Kitchen Knowledge Deficits related to Diabetes and Self Health Management   Nurse Case Manager Clinical Goal(s):  [name] Kitchen Over the next 90 days, patient/caregiver will verbalize basic understanding of Diabetes disease process and Self Health Management plan as evidenced by grandson Erlene Quan will verbalize increased understanding of the disease process for Diabetes including how to meal plan, carb counting, signs/symptoms of hypo/hyperglycemic events. 07/15/18-target Goal date extended out to 90 days  Interventions:   Completed a CCM RN Telephone Follow Up with grandson Claribel Sachs and patient; Ms. Chicoine gave verbal consent to speak with caregiver Ivory Broad . Comprehensive medication review performed . Advised patient to continue checking BGs, take medications as prescribed - caregiver [place] reports pt continues to have low's (70's) and high's (300's) . Discussed plans with patient for ongoing care management follow up and provided patient with direct contact information for care management team . Collaboration with embedded Pharm D Lottie Dawson re: unstable BGs reported by caregiver   Confirmed grandson Erlene Quan has RNCM contact # and hours of nurse availability  Scheduled a follow up call with Erlene Quan and caregiver [place]  Patient Self Care Activities:   [name] understanding of the information and education provided  Patient is currently UNABLE TO independently provide self-care or self administer medications . Attends all scheduled provider appointments (grandson and family are providing transportation) . Grandson or caregiver [place] calls pharmacy for medication refills . Needs assistance with ADL's independently . Needs assistance with IADL's independently . [name] calls provider  office for new concerns or questions   Please see past updates related to this goal by clicking on the "Past Updates" button in the selected goal     . "We need help with home repairs" (pt-stated)   Not on track    Grandson stated  Current Barriers:  . Financial constraints . Lacks knowledge of community resource: community housing solutions  Clinical Social Work Clinical Goal(s):  [name] Kitchen Over the next 60 days, client will work with SW to address concerns related to home modifications  Interventions:  Completed a CCM RN Telephone Follow Up with grandson Dalena Plantz and patient; Ms. Shrake gave verbal consent to speak with caregiver [place] for collaboration with Judeth Cornfield from [place] states they have not been notified by anyone from [employer] as of yet  Collaboration with [employer] with status update and notification that no one from [employer] has contacted Mr. Suder re: the home modifications  Scheduled a follow up call with caregiver Ivory Broad per consent from Ms. Daman  Patient Self Care Activities:  . Attends all scheduled provider appointments . Calls pharmacy for medication refills . Calls provider office for new concerns or questions   Please see past updates related to this goal by clicking on the "Past Updates" button in the selected goal      . "We need more help in the home" (pt-stated)   On track    Grandson and son stated:  Current Barriers:  . Financial constraints . Limited education about community resources and insurance benefits . Inability to perform ADL's independently . Inability to perform IADL's independently  Clinical Social Work Clinical Goal(s):  [name] Kitchen Over the next 45 days, client will  work with SW to address concerns related to obtaining a caregiver . Over the next 10 days, client will follow up with DSS to complete a Medicaid application as  directed by SW  Interventions:  Completed a CCM RN Telephone Follow Up with grandson Abbie Berling and patient; Ms. Croker gave verbal consent to speak with caregiver Ivory Broad  Assessed if the patient had been visited by SW from [place] at [facility] to assist with completion of Medicaid application - Latanza Pfefferkorn and caregiver Caryl Comes confirm the Medicaid application was completed and mailed on 07/05/18  Discussed that [name] will notify the CCM team once he receives notification with a determination  Collaboration with embedded BSW Daneen Schick re: status update for the Medicaid application  Scheduled a follow up call with grandson Erlene Quan and caregiver [place]  Patient Self Care Activities:  . Currently UNABLE TO independently perform iADL's and ADL's  . Relies on family members to assist with care needs  Please see past updates related to this goal by clicking on the "Past Updates" button in the selected goal     . COMPLETED: "We want back-up trnsportation options" (pt-stated)       Grandson Stated:  Current Barriers:  . Limited social support . ADL IADL limitations . Lacks knowledge of community resource: [place]  Clinical Social Work Clinical Goal(s):  [name] Kitchen Over the next 30 days, client will work with SW to address concerns related to transportation resources  Interventions:  Completed a CCM RN Telephone Follow Up with grandson Raynesha Tiedt and patient; Ms. Rainford gave verbal consent to speak with caregiver [place] application has not been received-Brandon states his cousin was asked to mail the application and he is unsure if she took care of it; he will ask her and mail if not  Discussed with Erlene Quan that the family is actually satisfied with the current arrangements they have in place for transportation and will plan to keep as it for now  Discussed he will let Daneen Schick BSW or myself know if they need to re-visit exploring options for  transportation   Collaboration with [place] with an update re: transportation and grandson's decision to keep transportation as is for now  Goal Met   Patient Self Care Activities:  . Currently UNABLE TO independently drive self   Please see past updates related to this goal by clicking on the "Past Updates" button in the selected goal      . I don't know if insulin regimen is working (pt-stated)   Not on track    Current Barriers:  [name] Kitchen Knowledge Deficits related to managing diabetes  Pharmacist Clinical Goal(s):  [name] Kitchen Over the next 30 days, patient will demonstrate Improved medication adherence as evidenced by BGs within range, FBG<130, no hypoglycemia . Over the next 30 days, patient will work with CCM team to address needs related to managing diabetes  Interventions:  Completed a CCM RN Telephone Follow Up with grandson Ellaree Gear and patient; Ms. Stiver gave verbal consent to speak with caregiver Ivory Broad . Comprehensive medication review performed . Advised patient to continue checking BGs, take medications as prescribed - caregiver [place]gu[place] reports pt continues to have low's (70's) and high's (300's) . Discussed plans with patient for ongoing care management follow up and provided patient with direct contact information for care management team . Collaboration with embedded Pharm D Lottie Dawson re: unstable BGs reported by caregiver   Patient Self Care Activities:  .  Currently UNABLE TO independently manage diabetes; chronic conditions  Please see past updates related to this goal by clicking on the "Past Updates" button in the selected goal        Other   . "Her pain is so bad from her arthritis"   Not on track    Current Barriers:  [name] Kitchen Knowledge Deficits related to pain management for Arthritis  Nurse Case Manager Clinical Goal(s):  [name] Kitchen Over the next 30 days, patient will demonstrate a decrease in Pain exacerbations as evidenced by patient will exhibit  increased and improved physical mobility and will be able to particpate in PT  Interventions:   Completed a CCM RN Telephone Follow Up with grandson Denya Buckingham and patient; Ms. Shadoan gave verbal consent to speak with caregiver Ivory Broad . Evaluation of current treatment plan related to pain management for arthritis and patient's adherence to plan as established by provider. . Advised patient to take Tramadol starting asap, exactly as directed for best benefits of pain relief . Reviewed medications with patient and discussed Rx for Tramadol, including indication, dosage and frequency for pain mangement   Patient Self Care Activities:   Patient is currently UNABLE TO independently provide self-care or self administer medications . Attends all scheduled provider appointments (grandson and family are providing transportation) . Grandson or caregiver [place] calls pharmacy for medication refills . Needs assistance with ADL's independently . Needs assistance with IADL's independently . [name] calls provider office for new concerns or questions   Initial goal documentation      . "I think my grandmother has CHF"   On track    Grandson stated:   Current Barriers:  [name] Kitchen Knowledge Deficits related to cause for lower leg edema and recent fall   Nurse Case Manager Clinical Goal(s):  [name] Kitchen Over the next 90 days, patient and grandson Mayia Megill will work with the CCM team to address care coordination needs and disease specific education needs for health related comorbidies and how to self manage. 07/15/18-Target goal date changed to 90 days  Interventions:   Completed a CCM RN Telephone Follow Up with grandson Catha Ontko and patient; Ms. Koroma gave verbal consent to speak with caregiver Ivory Broad  Assessed for caregiver's knowledge and understanding of disease process for CHF  Assessed for understanding of Self Health Management, checking daily weights after voiding,  using the same weight scales everyday, at the same time of the day each morning  Advised grandson/caregiver, providing education and rationale, to weigh daily and record, calling PCP, HHA and or CCM team for weight gain of 3lbs overnight or 5 pounds in a week  Provided verbal education related to other s/s suggestive of CHF and reinforced when to call the doctor/HHA/CCM team if symptoms occur  Mailed printed CHF educational materials for family/caregiver to review  Reviewed medications with caregiver and confirmed patient is taking diuretic exactly as prescribed  Collaboration with Dr. Baird Cancer via in basket message providing patient update  Patient Self Care Activities:  Floreen Comber and caregiver [place] verbalizes understanding of the education/information provided during today's call   Patient is currently UNABLE TO independently provide self-care or self administer medications . Attends all scheduled provider appointments (grandson and family are providing transportation) . Grandson or caregiver [place] calls pharmacy for medication refills . Needs assistance with ADL's independently . Needs assistance with IADL's independently . [name] calls provider office for new concerns or questions  Please see past updates related to this goal  by clicking on the "Past Updates" button in the selected goal      . "I would like a Palliative Care Referral"       Current Barriers:  [name] Kitchen Knowledge Deficits related to availbility and purpose of Palliative Care  Nurse Case Manager Clinical Goal(s):  [name] Kitchen Over the next 30 days, patient will verbalize understanding of plan for initiating a Palliative Care Referral for advanced care planning  Interventions:   Completed a CCM RN Telephone Follow Up with grandson Perlie Stene and patient; Ms. Dimmitt gave verbal consent to speak with caregiver Ivory Broad . Provided education to patient re: Palliative Care Services and process for  sending a referral . Collaborated with Dr. Baird Cancer regarding need for a Palliative Care Referral for advanced care planning  . Discussed plans with patient for ongoing care management follow up and provided patient with direct contact information for care management team  Patient Self Care Activities:   Patient is currently UNABLE TO independently provide self-care or self administer medications . Attends all scheduled provider appointments (grandson and family are providing transportation) . Grandson or caregiver [place] calls pharmacy for medication refills . Needs assistance with ADL's independently . Needs assistance with IADL's independently . [name] calls provider office for new concerns or questions  Initial goal documentation     . COMPLETED: "My grandmother's had a couple of falls"       Grandson stated:  Current Barriers:  [name] Kitchen Knowledge Deficits related to falls and prevention  . Impaired Physical Mobility . Inability to access appropriate DME in the home due to aged architecture structures of home  Nurse Case Manager Clinical Goal(s):  [name] Kitchen Over the next 30 days, patient and caregivers will verbalize understanding of plan for in home SN and PT services.  Interventions:   Completed a CCM RN Telephone Follow Up with grandson Antwonette Feliz and patient; Ms. Sampey gave verbal consent to speak with caregiver Ivory Broad  Confirmed in home SNV/PT/OT are in place through [facility] for questions/concerns related to in home services  Goal completed   Patient Self Care Activities:   [name] understanding of the information and education provided . Currently UNABLE TO independently provide self-care  . Self administers medications as prescribed (grandson is preparing all meds using a pill bed) . Attends all scheduled provider appointments (grandson and family are providing transportation) . Grandson calls pharmacy for medication refills . Needs  assistance with ADL's independently . Needs assistance with IADL's independently . [name] calls provider office for new concerns or questions  Please see past updates related to this goal by clicking on the "Past Updates" button in the selected goal     . High Risk for readmission        Current Barriers:  [name] Kitchen Knowledge Deficits related to prevention of UTI and dehyration  Nurse Case Manager Clinical Goal(s):  [name] Kitchen Over the next 30 days, patient will not experience hospital admission. Hospital Admissions in last 6 months = 3  Interventions:   Completed a CCM RN Telephone Follow Up with grandson Esthela Brandner and patient; Ms. Killilea gave verbal consent to speak with caregiver Ivory Broad . Evaluation of current treatment plan related to recurrent UTI's and patient's adherence to plan as established by provider. . Advised caregiver to encourage patient to urinate when she feels the urge; discussed urine should be clear yellow; discussed patient should be urinating 5-6 times per day . Provided education to patient re: importance of keeping  patient well hydrated, with water, sugar free popsicles and or sugar free jello, and or low sodium broth; to keep kidneys working well and to help prevent a recurrent UTI; suggested using a straw and have patient take sips throughout the day . Discussed plans with patient for ongoing care management follow up and provided patient with direct contact information for care management team  Patient Self Care Activities:   Patient is currently UNABLE TO independently provide self-care or self administer medications . Attends all scheduled provider appointments (grandson and family are providing transportation) . Grandson or caregiver [place] calls pharmacy for medication refills . Needs assistance with ADL's independently . Needs assistance with IADL's independently . [name] calls provider office for new concerns or questions  Initial goal  documentation       The patient verbalized understanding of instructions provided today and declined a print copy of patient instruction materials.   The CCM team will reach out to the patient again over the next 7-10 days.   Barb Merino, RN,CCM Care Management Coordinator [facility]  Direct Phone: [phone]

## 2018-07-16 ENCOUNTER — Telehealth: Payer: Medicare Other | Admitting: Pharmacist

## 2018-07-18 ENCOUNTER — Other Ambulatory Visit: Payer: Self-pay | Admitting: Internal Medicine

## 2018-07-18 DIAGNOSIS — R630 Anorexia: Secondary | ICD-10-CM

## 2018-07-18 DIAGNOSIS — M2550 Pain in unspecified joint: Secondary | ICD-10-CM

## 2018-07-18 DIAGNOSIS — Z794 Long term (current) use of insulin: Secondary | ICD-10-CM

## 2018-07-18 DIAGNOSIS — I129 Hypertensive chronic kidney disease with stage 1 through stage 4 chronic kidney disease, or unspecified chronic kidney disease: Secondary | ICD-10-CM

## 2018-07-18 DIAGNOSIS — E1122 Type 2 diabetes mellitus with diabetic chronic kidney disease: Secondary | ICD-10-CM

## 2018-07-18 DIAGNOSIS — N183 Chronic kidney disease, stage 3 unspecified: Secondary | ICD-10-CM

## 2018-07-20 ENCOUNTER — Ambulatory Visit: Payer: Self-pay

## 2018-07-20 DIAGNOSIS — N183 Chronic kidney disease, stage 3 unspecified: Secondary | ICD-10-CM

## 2018-07-20 DIAGNOSIS — I10 Essential (primary) hypertension: Secondary | ICD-10-CM

## 2018-07-20 DIAGNOSIS — R600 Localized edema: Secondary | ICD-10-CM

## 2018-07-20 DIAGNOSIS — E1122 Type 2 diabetes mellitus with diabetic chronic kidney disease: Secondary | ICD-10-CM

## 2018-07-20 NOTE — Patient Instructions (Signed)
Social Worker Visit Information  Goals we discussed today:  Goals Addressed            This Visit's Progress     Patient Stated   . "We need help with home repairs" (pt-stated)   Not on track    Grandson stated  Current Barriers:  . Financial constraints . Lacks knowledge of community resource: community housing solutions  Clinical Social Work Clinical Goal(s):  [name] Kitchen Over the next 60 days, client will work with SW to address concerns related to home modifications  Interventions:  CCM SW placed an unsuccessful call to [employer] requesting return communication either via call or e-mail regarding status of patients referral for home modifications. CCM SW has had difficulty maintaining contact with resource.  Patient Self Care Activities:  . Attends all scheduled provider appointments . Calls pharmacy for medication refills . Calls provider office for new concerns or questions   Please see past updates related to this goal by clicking on the "Past Updates" button in the selected goal          Materials Provided: No. Patient not reached.  Follow Up Plan: SW will follow up with the patient and her caregiver over the next 7-10 days.   Bevelyn Ngo, BSW, CDP [facility] Care Management Social Worker [phone]

## 2018-07-20 NOTE — Chronic Care Management (AMB) (Signed)
  Chronic Care Management   Social Work Note  07/20/2018 Name: Helen Richardson MRN: [id number] DOB: [birth year]  [name] is a 85 y.o. year old female who sees Dorothyann Peng, MD for primary care. The CCM team was consulted for assistance with [name].    Goals Addressed            This Visit's Progress     Patient Stated   . "We need help with home repairs" (pt-stated)   Not on track    Grandson stated  Current Barriers:  . Financial constraints . Lacks knowledge of community resource: community housing solutions  Clinical Social Work Clinical Goal(s):  [name] Kitchen Over the next 60 days, client will work with SW to address concerns related to home modifications  Interventions:  CCM SW placed an unsuccessful call to [employer] requesting return communication either via call or e-mail regarding status of patients referral for home modifications. CCM SW has had difficulty maintaining contact with resource.  Patient Self Care Activities:  . Attends all scheduled provider appointments . Calls pharmacy for medication refills . Calls provider office for new concerns or questions   Please see past updates related to this goal by clicking on the "Past Updates" button in the selected goal         Follow Up Plan: SW will outreach the patient and her caregiver over the next 7-10 days to explore alternate resources for home repairs considering the barriers in maintaining contact with [employer].  Bevelyn Ngo, BSW, CDP [facility] Care Management Social Worker [phone]  Total time spent performing care coordination and/or care management activities with the patient by phone or face to face = 6 minutes.

## 2018-07-21 ENCOUNTER — Telehealth: Payer: Medicare Other

## 2018-07-21 ENCOUNTER — Encounter: Payer: Self-pay | Admitting: Nurse Practitioner

## 2018-07-21 ENCOUNTER — Ambulatory Visit: Payer: Self-pay

## 2018-07-21 ENCOUNTER — Other Ambulatory Visit: Payer: Self-pay

## 2018-07-21 DIAGNOSIS — I10 Essential (primary) hypertension: Secondary | ICD-10-CM

## 2018-07-21 DIAGNOSIS — N183 Chronic kidney disease, stage 3 unspecified: Secondary | ICD-10-CM

## 2018-07-21 DIAGNOSIS — I129 Hypertensive chronic kidney disease with stage 1 through stage 4 chronic kidney disease, or unspecified chronic kidney disease: Secondary | ICD-10-CM

## 2018-07-21 DIAGNOSIS — E1122 Type 2 diabetes mellitus with diabetic chronic kidney disease: Secondary | ICD-10-CM | POA: Diagnosis not present

## 2018-07-21 DIAGNOSIS — R600 Localized edema: Secondary | ICD-10-CM

## 2018-07-21 DIAGNOSIS — Z794 Long term (current) use of insulin: Secondary | ICD-10-CM | POA: Diagnosis not present

## 2018-07-21 NOTE — Patient Instructions (Signed)
Goals Addressed      Patient Stated   . COMPLETED: "Her blood sugars are running around 200 in the morning before breakfast" (pt-stated)       Grandson stated  Current Barriers:  [name] Kitchen. Knowledge Deficits related to Diabetes and Self Health Management   Nurse Case Manager Clinical Goal(s):  [name] Kitchen. Over the next 90 days, patient/caregiver will verbalize basic understanding of Diabetes disease process and Self Health Management plan as evidenced by grandson Apolinar JunesBrandon will verbalize increased understanding of the disease process for Diabetes including how to meal plan, carb counting, signs/symptoms of hypo/hyperglycemic events. 07/15/18-target Goal date extended out to 90 days  CCM RN CM Interventions:  Completed on 07/21/18   Completed a CCM RN Telephone Follow Up with caregiver [name] advised the patient has a poor appetite and her BS dropped to 7459, [name] followed instructions previously discussed with last RNCM call; BG 15 min recheck= 64; caregiver repeated juice and gave 1 hershey kiss chocolate candy; caregiver instructed to do 15 min recheck and hold evening insulin . Discussed plans with patient for ongoing care management follow up and provided patient with direct contact information for care management team . Collaboration with embedded Pharm D [name] re: unstable BGs reported by caregiver  . Goal completed due to duplicate (see pharmacy goal)  Patient Self Care Activities:   Verbalizes understanding of the information and education provided  Patient is currently UNABLE TO independently provide self-care or self administer medications . Attends all scheduled provider appointments (grandson and family are providing transportation) . Grandson or caregiver [name] calls pharmacy for medication refills . Needs assistance with ADL's independently . Needs assistance with IADL's independently . Leanna BattlesGrandson Brandon calls provider office for new concerns or questions  Please see  past updates related to this goal by clicking on the "Past Updates" button in the selected goal     . I don't know if insulin regimen is working (pt-stated)   Not on track    Current Barriers:  [name] Kitchen. Knowledge Deficits related to managing diabetes  Pharmacist Clinical Goal(s):  [name] Kitchen. Over the next 30 days, patient will demonstrate Improved medication adherence as evidenced by BGs within range, FBG<130, no hypoglycemia . Over the next 30 days, patient will work with CCM team to address needs related to managing diabetes  CCM RN CM Interventions: Completed on 07/21/18   Completed a CCM RN Telephone Follow Up with caregiver [name] advised the patient has a poor appetite and her BS dropped to 3859, [name] followed instructions previously discussed with last RNCM call; BG 15 min recheck= 64; caregiver repeated juice and gave 1 hershey kiss chocolate candy; caregiver instructed to do 15 min recheck and hold evening insulin . Discussed plans with patient for ongoing care management follow up and provided patient with direct contact information for care management team . Collaboration with embedded Pharm D [name] re: unstable BGs reported by caregiver   Patient Self Care Activities:  . Currently UNABLE TO independently manage diabetes; chronic conditions  Please see past updates related to this goal by clicking on the "Past Updates" button in the selected goal       . "I would like a Palliative Care Referral"   Not on track    Current Barriers:  [name] Kitchen. Knowledge Deficits related to availbility and purpose of Palliative Care  Nurse Case Manager Clinical Goal(s):  [name] Kitchen. Over the next 30 days, patient will verbalize understanding of plan for initiating a  Palliative Care Referral for advanced care planning  CCM RN CM Interventions: Initial interventions 07/15/18   Completed a CCM RN Telephone Follow Up with grandson Rajah Sera and patient; Ms. Rankin gave verbal consent to speak with  caregiver Lessie Dings . Provided education to patient re: Palliative Care Services and process for sending a referral . Collaborated with Dr. Allyne Gee regarding need for a Palliative Care Referral for advanced care planning  . Discussed plans with patient for ongoing care management follow up and provided patient with direct contact information for care management team  CCM SW Interventions: Completed 07/21/18  Collaboration with CCM RN Case Manager regarding patient reported symptoms by caregiver  Performed chart review to confirm referral recipient  Collaboration with [facility] Palliative Care referral coordinatory, Misty Stanley  Confirmed the patients referral was received on 5/18  Provided Stacey with caregiver contact numbers as well as concern of patients reported condition to CCM RN Case Manager  Provided CCM SW contact number should Palliative care team need more information  Updated CCM RN Case Manager on above interventions  Patient Self Care Activities:   Patient is currently UNABLE TO independently provide self-care or self administer medications . Attends all scheduled provider appointments (grandson and family are providing transportation) . Grandson or caregiver [place] calls pharmacy for medication refills . Needs assistance with ADL's independently . Needs assistance with IADL's independently . Leanna Battles calls provider office for new concerns or questions  Please see past updates related to this goal by clicking on the "Past Updates" button in the selected goal     . High Risk for readmission    Not on track    Current Barriers:  [name] Kitchen Knowledge Deficits related to prevention of UTI and dehyration  Nurse Case Manager Clinical Goal(s):  [name] Kitchen Over the next 30 days, patient will not experience hospital admission. Hospital Admissions in last 6 months = 3  CCM RN CM Interventions:  Completed on 07/21/18   Completed RNCM Telephone Follow Up with caregiver Lessie Dings  Received message from [facility] Mariam Dollar requesting a call be placed to caregiver Lessie Dings to follow up on patient reported symptoms   Comprehensive assessment completed; Assessed for s/s of dehydration and or infection - caregiver reports the patient has a poor appetite, she is in pain and will not get out of the bed, she is not drinking fluids and has decreased output as a result . Requested caregiver check patient's vital signs; BP 128/78, HR 106, temp 98.3 . Instructed caregiver to push fluids and check patient's CBG; reported CBG 59; see pharmacy insulin goal for interventions given . Placed outbound call to grandson [name], education provided related to Hospice Care Services verses Palliative Care Services - Henretter Chakrabarti would like to request Hospice Care be ordered in place of Palliative Care at this time . Secure message sent to Dr. Allyne Gee with notification of patient's declining status and families decision to transition to Hospice Care  . Outbound call placed to [facility] [phone], spoke with Amber intake nurse; advised of new order for Hospice Care to evaluate and treat per Dr. Allyne Gee; advised the order is being faxed and the patient will benefit from having a home visit this evening if possible; Confirmed a prn Hospice nurse is available to complete the initial intake with patient tonight and nurse Amber will help expedite . Sent secure message to Dr. Allyne Gee and the CCM team . Placed outbound call to caregiver Lessie Dings with notification of plan for initial  Hospice visit to take place tonight - she verbalizes understanding . Discussed plans for care management follow up and provided caregiver with direct contact information for care management team . Scheduled RNCM Follow Up call to caregiver/grandson for 07/22/18 for patient status update and Hospice Care referral   Patient Self Care Activities:   Verbalizes understanding of the education/instructions  given today  Patient is currently UNABLE TO independently provide self-care or self administer medications . Attends all scheduled provider appointments (grandson and family are providing transportation) . Grandson or caregiver [place] calls pharmacy for medication refills . Needs assistance with ADL's independently . Needs assistance with IADL's independently . Leanna Battles calls provider office for new concerns or questions  Please see past updates related to this goal by clicking on the "Past Updates" button in the selected goal        The patient verbalized understanding of instructions provided today and declined a print copy of patient instruction materials.   Telephone follow up appointment with CCM team member scheduled for: 07/21/18   Delsa Sale, [facility] Care Management Coordinator [facility] Care Management/[facility]  Direct Phone: [phone]

## 2018-07-21 NOTE — Chronic Care Management (AMB) (Signed)
Chronic Care Management   Follow Up Note   07/21/2018 Name: Helen Richardson MRN: [id number] DOB: Jun 26, 1933  Referred by: Dorothyann Peng, MD Reason for referral : Chronic Care Management (RNCM Telephone Follow Up)   Helen Richardson is a 85 y.o. year old female who is a primary care patient of Dorothyann Peng, MD. The CCM team was consulted for assistance with chronic disease management and care coordination needs.    Review of patient status, including review of consultants reports, relevant laboratory and other test results, and collaboration with appropriate care team members and the patient's provider was performed as part of comprehensive patient evaluation and provision of chronic care management services.    I spoke with Helen Richardson, caregiver for Helen Richardson by telephone today.   Goals Addressed      Patient Stated   . COMPLETED: "Her blood sugars are running around 200 in the morning before breakfast" (pt-stated)       Grandson stated  Current Barriers:  [name] Kitchen Knowledge Deficits related to Diabetes and Self Health Management   Nurse Case Manager Clinical Goal(s):  [name] Kitchen Over the next 90 days, patient/caregiver will verbalize basic understanding of Diabetes disease process and Self Health Management plan as evidenced by grandson Helen Richardson will verbalize increased understanding of the disease process for Diabetes including how to meal plan, carb counting, signs/symptoms of hypo/hyperglycemic events. 07/15/18-target Goal date extended out to 90 days  CCM RN CM Interventions:  Completed on 07/21/18   Completed a CCM RN Telephone Follow Up with caregiver Helen Richardson . Helen Richardson advised the patient has a poor appetite and her BS dropped to 7, Helen Richardson followed instructions previously discussed with last RNCM call; BG 15 min recheck= 64; caregiver repeated juice and gave 1 hershey kiss chocolate candy; caregiver instructed to do 15 min recheck and hold evening insulin . Discussed plans with  patient for ongoing care management follow up and provided patient with direct contact information for care management team . Collaboration with embedded Pharm D Vanice Sarah re: unstable BGs reported by caregiver  . Goal completed due to duplicate (see pharmacy goal)  Patient Self Care Activities:   Verbalizes understanding of the information and education provided  Patient is currently UNABLE TO independently provide self-care or self administer medications . Attends all scheduled provider appointments (grandson and family are providing transportation) . Grandson or caregiver Helen Richardson calls pharmacy for medication refills . Needs assistance with ADL's independently . Needs assistance with IADL's independently . Helen Richardson calls provider office for new concerns or questions  Please see past updates related to this goal by clicking on the "Past Updates" button in the selected goal     . I don't know if insulin regimen is working (pt-stated)   Not on track    Current Barriers:  [name] Kitchen Knowledge Deficits related to managing diabetes  Pharmacist Clinical Goal(s):  [name] Kitchen Over the next 30 days, patient will demonstrate Improved medication adherence as evidenced by BGs within range, FBG<130, no hypoglycemia . Over the next 30 days, patient will work with CCM team to address needs related to managing diabetes  CCM RN CM Interventions: Completed on 07/21/18   Completed a CCM RN Telephone Follow Up with caregiver Helen Richardson . Helen Richardson advised the patient has a poor appetite and her BS dropped to 51, Helen Richardson followed instructions previously discussed with last RNCM call; BG 15 min recheck= 64; caregiver repeated juice and gave 1 hershey kiss chocolate candy; caregiver instructed to do 15 min  recheck and hold evening insulin . Discussed plans with patient for ongoing care management follow up and provided patient with direct contact information for care management team . Collaboration with embedded  Pharm D Vanice Sarah re: unstable BGs reported by caregiver   Patient Self Care Activities:  . Currently UNABLE TO independently manage diabetes; chronic conditions  Please see past updates related to this goal by clicking on the "Past Updates" button in the selected goal       . "I would like a Palliative Care Referral"   Not on track    Current Barriers:  [name] Kitchen Knowledge Deficits related to availbility and purpose of Palliative Care  Nurse Case Manager Clinical Goal(s):  [name] Kitchen Over the next 30 days, patient will verbalize understanding of plan for initiating a Palliative Care Referral for advanced care planning  CCM RN CM Interventions: Initial interventions 07/15/18   Completed a CCM RN Telephone Follow Up with grandson Helen Richardson and patient; Ms. Capano gave verbal consent to speak with caregiver Helen Richardson . Provided education to patient re: Palliative Care Services and process for sending a referral . Collaborated with Dr. Allyne Gee regarding need for a Palliative Care Referral for advanced care planning  . Discussed plans with patient for ongoing care management follow up and provided patient with direct contact information for care management team  CCM SW Interventions: Completed 07/21/18  Collaboration with CCM RN Case Manager regarding patient reported symptoms by caregiver  Performed chart review to confirm referral recipient  Collaboration with [facility] Palliative Care referral coordinatory, Helen Richardson  Confirmed the patients referral was received on 5/18  Provided Stacey with caregiver contact numbers as well as concern of patients reported condition to CCM RN Case Manager  Provided CCM SW contact number should Palliative care team need more information  Updated CCM RN Case Manager on above interventions  Patient Self Care Activities:   Patient is currently UNABLE TO independently provide self-care or self administer medications . Attends all scheduled provider  appointments (grandson and family are providing transportation) . Grandson or caregiver Helen Richardson calls pharmacy for medication refills . Needs assistance with ADL's independently . Needs assistance with IADL's independently . Helen Richardson calls provider office for new concerns or questions  Please see past updates related to this goal by clicking on the "Past Updates" button in the selected goal     . High Risk for readmission    Not on track    Current Barriers:  [name] Kitchen Knowledge Deficits related to prevention of UTI and dehyration  Nurse Case Manager Clinical Goal(s):  [name] Kitchen Over the next 30 days, patient will not experience hospital admission. Hospital Admissions in last 6 months = 3  CCM RN CM Interventions:  Completed on 07/21/18   Completed RNCM Telephone Follow Up with caregiver Helen Richardson  Received message from [facility] Helen Richardson requesting a call be placed to caregiver Helen Richardson to follow up on patient reported symptoms   Comprehensive assessment completed; Assessed for s/s of dehydration and or infection - caregiver reports the patient has a poor appetite, she is in pain and will not get out of the bed, she is not drinking fluids and has decreased output as a result . Requested caregiver check patient's vital signs; BP 128/78, HR 106, temp 98.3 . Instructed caregiver to push fluids and check patient's CBG; reported CBG 59; see pharmacy insulin goal for interventions given . Placed outbound call to grandson Helen Richardson, education provided related to [facility] verses Palliative  Care Services - Helen RoanBrandon Richardson would like to request Hospice Care be ordered in place of Palliative Care at this time . Secure message sent to [name] with notification of patient's declining status and families decision to transition to Hospice Care  . [facility] [phone], spoke with Helen Richardson intake nurse; advised of new order for Hospice Care to evaluate and  treat per [name]; advised the order is being faxed and the patient will benefit from having a home visit this evening if possible; Confirmed a prn Hospice nurse is available to complete the initial intake with patient tonight and nurse Helen Richardson will help expedite . Sent secure message to [name] and the CCM team . Placed outbound call to caregiver Helen DingsSierra Haith with notification of plan for initial Hospice visit to take place tonight - she verbalizes understanding . Discussed plans for care management follow up and provided caregiver with direct contact information for care management team . Scheduled RNCM Follow Up call to caregiver/grandson for 07/22/18 for patient status update and Hospice Care referral   Patient Self Care Activities:   Verbalizes understanding of the education/instructions given today  Patient is currently UNABLE TO independently provide self-care or self administer medications . Attends all scheduled provider appointments (grandson and family are providing transportation) . Grandson or caregiver [name] calls pharmacy for medication refills . Needs assistance with ADL's independently . Needs assistance with IADL's independently . Helen BattlesGrandson Helen calls provider office for new concerns or questions  Please see past updates related to this goal by clicking on the "Past Updates" button in the selected goal         Telephone follow up appointment with CCM team member scheduled for: 07/21/18   Delsa SaleAngel Charma Mocarski, [facility] Baptist CampusRN,CCM Care Management Coordinator [facility] Care Management/[facility]  Direct Phone: [phone]

## 2018-07-21 NOTE — Patient Instructions (Addendum)
Social Worker Visit Information  Goals we discussed today:  Goals Addressed            This Visit's Progress   . "I would like a Palliative Care Referral"       Current Barriers:  [name] Kitchen Knowledge Deficits related to availbility and purpose of Palliative Care  Nurse Case Manager Clinical Goal(s):  [name] Kitchen Over the next 30 days, patient will verbalize understanding of plan for initiating a Palliative Care Referral for advanced care planning  CCM RN CM Interventions: Initial interventions 07/15/18   Completed a CCM RN Telephone Follow Up with grandson Hiyab Malave and patient; Ms. Queener gave verbal consent to speak with caregiver Lessie Dings . Provided education to patient re: Palliative Care Services and process for sending a referral . Collaborated with Dr. Allyne Gee regarding need for a Palliative Care Referral for advanced care planning  . Discussed plans with patient for ongoing care management follow up and provided patient with direct contact information for care management team  CCM SW Interventions: Completed 07/21/18  Collaboration with CCM RN Case Manager regarding patient reported symptoms by caregiver  Performed chart review to confirm referral recipient  Collaboration with [facility] Palliative Care referral coordinatory, Misty Stanley  Confirmed the patients referral was received on 5/18  Provided Stacey with caregiver contact numbers as well as concern of patients reported condition to CCM RN Case Manager  Provided CCM SW contact number should Palliative care team need more information  Updated CCM RN Case Manager on above interventions  Patient Self Care Activities:   Patient is currently UNABLE TO independently provide self-care or self administer medications . Attends all scheduled provider appointments (grandson and family are providing transportation) . Grandson or caregiver [place] calls pharmacy for medication refills . Needs assistance with ADL's  independently . Needs assistance with IADL's independently . Leanna Battles calls provider office for new concerns or questions   Please see past updates related to this goal by clicking on the "Past Updates" button in the selected goal           Materials Provided: No. Patient not reached.  Follow Up Plan: SW will continue collaboration with CCM RN Case Manager regarding patient care needs.   Bevelyn Ngo, BSW, CDP [facility] / [facility] Care Management Social Worker [phone]

## 2018-07-21 NOTE — Chronic Care Management (AMB) (Signed)
  Chronic Care Management    Clinical Social Work Follow Up Note  07/21/2018 Name: Helen Richardson MRN: [id number] DOB: 1934/02/23  Helen Richardson is a 85 y.o. year old female who is a primary care patient of Dorothyann Peng, MD. The CCM team was consulted for assistance with Community Resources and Level of Care Concerns.   Review of patient status, including review of consultants reports, other relevant assessments, and collaboration with appropriate care team members and the patient's provider was performed as part of comprehensive patient evaluation and provision of chronic care management services.     I performed care coordination to assist CCM RN Case Manager in navigating patients referral for Palliative care.  Goals Addressed            This Visit's Progress   . "I would like a Palliative Care Referral"       Current Barriers:  [name] Kitchen Knowledge Deficits related to availbility and purpose of Palliative Care  Nurse Case Manager Clinical Goal(s):  [name] Kitchen Over the next 30 days, patient will verbalize understanding of plan for initiating a Palliative Care Referral for advanced care planning  CCM RN CM Interventions: Initial interventions 07/15/18   Completed a CCM RN Telephone Follow Up with grandson Helen Richardson and patient; Ms. Gamel gave verbal consent to speak with caregiver Helen Richardson . Provided education to patient re: Palliative Care Services and process for sending a referral . Collaborated with Dr. Allyne Gee regarding need for a Palliative Care Referral for advanced care planning  . Discussed plans with patient for ongoing care management follow up and provided patient with direct contact information for care management team  CCM SW Interventions: Completed 07/21/18  Collaboration with CCM RN Case Manager regarding patient reported symptoms by caregiver  Performed chart review to confirm referral recipient  Collaboration with [facility] Palliative Care referral coordinatory,  Helen Richardson  Confirmed the patients referral was received on 5/18  Provided Stacey with caregiver contact numbers as well as concern of patients reported condition to CCM RN Case Manager  Provided CCM SW contact number should Palliative care team need more information  Updated CCM RN Case Manager on above interventions  Patient Self Care Activities:   Patient is currently UNABLE TO independently provide self-care or self administer medications . Attends all scheduled provider appointments (grandson and family are providing transportation) . Grandson or caregiver Helen Richardson calls pharmacy for medication refills . Needs assistance with ADL's independently . Needs assistance with IADL's independently . Helen Richardson calls provider office for new concerns or questions   Please see past updates related to this goal by clicking on the "Past Updates" button in the selected goal         Follow Up Plan: CCM SW will continue to collaborate with CCM RN Case Manager regarding patient care needs.   Helen Richardson, BSW, CDP [facility] / [facility] Care Management Social Worker [phone]  Total time spent performing care coordination and/or care management activities with the patient by phone or face to face = 12 minutes.

## 2018-07-22 ENCOUNTER — Ambulatory Visit: Payer: Self-pay

## 2018-07-22 ENCOUNTER — Telehealth: Payer: Medicare Other

## 2018-07-22 ENCOUNTER — Other Ambulatory Visit: Payer: Medicare Other | Admitting: Adult Health Nurse Practitioner

## 2018-07-22 ENCOUNTER — Ambulatory Visit: Payer: Self-pay | Admitting: Pharmacist

## 2018-07-22 DIAGNOSIS — E1122 Type 2 diabetes mellitus with diabetic chronic kidney disease: Secondary | ICD-10-CM

## 2018-07-22 DIAGNOSIS — W19XXXA Unspecified fall, initial encounter: Secondary | ICD-10-CM

## 2018-07-22 DIAGNOSIS — I129 Hypertensive chronic kidney disease with stage 1 through stage 4 chronic kidney disease, or unspecified chronic kidney disease: Secondary | ICD-10-CM | POA: Diagnosis not present

## 2018-07-22 DIAGNOSIS — I10 Essential (primary) hypertension: Secondary | ICD-10-CM

## 2018-07-22 DIAGNOSIS — N183 Chronic kidney disease, stage 3 unspecified: Secondary | ICD-10-CM

## 2018-07-22 DIAGNOSIS — Z794 Long term (current) use of insulin: Secondary | ICD-10-CM | POA: Diagnosis not present

## 2018-07-22 DIAGNOSIS — R600 Localized edema: Secondary | ICD-10-CM

## 2018-07-22 NOTE — Chronic Care Management (AMB) (Signed)
  Chronic Care Management    Clinical Social Work Follow Up Note  07/22/2018 Name: KRISTARA TARRO MRN: [id number] DOB: Jun 28, 1933  MARRA HIRKO is a 85 y.o. year old female who is a primary care patient of Dorothyann Peng, MD. The CCM team was consulted for assistance with Hospice/Palliative Care Services Education.   Review of patient status, including review of consultants reports, other relevant assessments, and collaboration with appropriate care team members and the patient's provider was performed as part of comprehensive patient evaluation and provision of chronic care management services.     Goals Addressed            This Visit's Progress   . "I would like a Palliative Care Referral"       Current Barriers:  [name] Kitchen Knowledge Deficits related to availbility and purpose of Palliative Care  Nurse Case Manager Clinical Goal(s):  [name] Kitchen Over the next 30 days, patient will verbalize understanding of plan for initiating a Palliative Care Referral for advanced care planning  CCM RN CM Interventions: Initial interventions 07/15/18   Completed a CCM RN Telephone Follow Up with grandson Lundynn Hollingworth and patient; Ms. Tieu gave verbal consent to speak with caregiver Lessie Dings . Provided education to patient re: Palliative Care Services and process for sending a referral . Collaborated with Dr. Allyne Gee regarding need for a Palliative Care Referral for advanced care planning  . Discussed plans with patient for ongoing care management follow up and provided patient with direct contact information for care management team  CCM SW Interventions: Completed 07/22/18  Inbound call from Palliative Care RN navigator Beryle Beams to obtain background on patient status   CCM SW informed the patients family had chosen to decline hospice services and enroll with Palliative Care in order to keep Home Health RN visits  Collaboration with CCM RN Case Manager to provide update that patient had not yet  been admitted to hospice and was still scheduled for 1:00 pm Palliative Care consult  Inbound call from Cherlyn Roberts with  patients primary provider office to inform CCM SW the practice received a voice message stating the patient was not enrolled in the hospice program due to not meeting eligibility requirements  Collaboration with CCM RN Case Manager to provide updated information from provider office  Outbound call to [facility] Palliative Care RN Navigator to determine outcome of Palliative Care consult - left voice message requesting a return call  Patient Self Care Activities:   Patient is currently UNABLE TO independently provide self-care or self administer medications . Attends all scheduled provider appointments (grandson and family are providing transportation) . Grandson or caregiver [place] calls pharmacy for medication refills . Needs assistance with ADL's independently . Needs assistance with IADL's independently . Leanna Battles calls provider office for new concerns or questions   Please see past updates related to this goal by clicking on the "Past Updates" button in the selected goal        Follow Up Plan: SW will follow up with patient by phone over the next 7 days   Bevelyn Ngo, [place], CDP [facility] Care Management Social Worker [phone]  Total time spent performing care coordination and/or care management activities with the patient by phone or face to face = 13 minutes.

## 2018-07-22 NOTE — Patient Instructions (Addendum)
Visit Information  Goals Addressed    . High Risk for readmission        Current Barriers:  [name] Kitchen Knowledge Deficits related to prevention of UTI and dehyration  Nurse Case Manager Clinical Goal(s):  [name] Kitchen Over the next 30 days, patient will not experience hospital admission. Hospital Admissions in last 6 months = 3  CCM RN CM Interventions:  Completed on 07/22/18   Collaborative call completed with embedded Pharm D Vanice Sarah and caregiver Corinna Gab is not at the patient's home this am but is able to confirm a Hospice nurse came to the home last evening for the intake visit  [place] reports the patient's CBG jumped up to 383 at bedtime last evening  Placed outbound call to grandson Mama Podolak, [place] confirmed Hospice has been initiated and he is expecting a nurse to return to the home this afternoon  Assessed for patient's BG this am; he will check and call me with the results  Internal collaboration with BSW Bevelyn Ngo stating patient is not going to receive Hospice Care at this time; it is unclear if the patient is not Hospice appropriate or if the family declined; Enrique Sack was advised the Palliative NP will make a home visit today  Scheduled a CCM f/u call to grandson and or caregiver [place] for next week   Patient Self Care Activities:   Verbalizes understanding of the education/instructions given today  Patient is currently UNABLE TO independently provide self-care or self administer medications . Attends all scheduled provider appointments (grandson and family are providing transportation) . Grandson or caregiver [place] calls pharmacy for medication refills . Needs assistance with ADL's independently . Needs assistance with IADL's independently . Leanna Battles calls provider office for new concerns or questions  Please see past updates related to this goal by clicking on the "Past Updates" button in the selected goal        The patient verbalized  understanding of instructions provided today and declined a print copy of patient instruction materials.   Telephone follow up appointment with CCM team member scheduled for: 07/28/18  Delsa Sale, [facility] Care Management Coordinator [facility] Care Management/[facility]  Direct Phone: [phone]

## 2018-07-22 NOTE — Patient Instructions (Signed)
Social Worker Visit Information  Goals we discussed today:  Goals Addressed            This Visit's Progress   . "I would like a Palliative Care Referral"       Current Barriers:  [name] Kitchen Knowledge Deficits related to availbility and purpose of Palliative Care  Nurse Case Manager Clinical Goal(s):  [name] Kitchen Over the next 30 days, patient will verbalize understanding of plan for initiating a Palliative Care Referral for advanced care planning  CCM RN CM Interventions: Initial interventions 07/15/18   Completed a CCM RN Telephone Follow Up with grandson Waunetta Trundle and patient; Ms. Calhoon gave verbal consent to speak with caregiver Lessie Dings . Provided education to patient re: Palliative Care Services and process for sending a referral . Collaborated with Dr. Allyne Gee regarding need for a Palliative Care Referral for advanced care planning  . Discussed plans with patient for ongoing care management follow up and provided patient with direct contact information for care management team  CCM SW Interventions: Completed 07/22/18  Inbound call from Palliative Care RN navigator Beryle Beams to obtain background on patient status   CCM SW informed the patients family had chosen to decline hospice services and enroll with Palliative Care in order to keep Home Health RN visits  Collaboration with CCM RN Case Manager to provide update that patient had not yet been admitted to hospice and was still scheduled for 1:00 pm Palliative Care consult  Inbound call from Cherlyn Roberts with  patients primary provider office to inform CCM SW the practice received a voice message stating the patient was not enrolled in the hospice program due to not meeting eligibility requirements  Collaboration with CCM RN Case Manager to provide updated information from provider office  Outbound call to [facility] Palliative Care RN Navigator to determine outcome of Palliative Care consult - left voice message requesting a  return call  Patient Self Care Activities:   Patient is currently UNABLE TO independently provide self-care or self administer medications . Attends all scheduled provider appointments (grandson and family are providing transportation) . Grandson or caregiver [place] calls pharmacy for medication refills . Needs assistance with ADL's independently . Needs assistance with IADL's independently . Leanna Battles calls provider office for new concerns or questions   Please see past updates related to this goal by clicking on the "Past Updates" button in the selected goal           Materials Provided: No. Patient not reached.  Follow Up Plan: SW will follow up with patient by phone over the next 7 days  Bevelyn Ngo, [place], CDP [facility] / [facility] Care Management Social Worker [phone]

## 2018-07-22 NOTE — Chronic Care Management (AMB) (Signed)
  Chronic Care Management   Follow Up Note   07/22/2018 Name: [name] MRN: [id number] DOB: 11-05-1933  Referred by: Dorothyann Peng, MD Reason for referral : Chronic Care Management (CCM RNCM Telephone Follow Up)   Helen Richardson is a 85 y.o. year old female who is a primary care patient of Dorothyann Peng, MD. The CCM team was consulted for assistance with chronic disease management and care coordination needs.    Review of patient status, including review of consultants reports, relevant laboratory and other test results, and collaboration with appropriate care team members and the patient's provider was performed as part of comprehensive patient evaluation and provision of chronic care management services.    A collaborative call was placed to patient's caregiver Lessie Dings and grandson Zhara Wanger re: Hospice Care.    Goals Addressed    . High Risk for readmission        Current Barriers:  [name] Kitchen Knowledge Deficits related to prevention of UTI and dehyration  Nurse Case Manager Clinical Goal(s):  [name] Kitchen Over the next 30 days, patient will not experience hospital admission. Hospital Admissions in last 6 months = 3  CCM RN CM Interventions:  Completed on 07/22/18   Collaborative call completed with embedded Pharm D Vanice Sarah and caregiver Corinna Gab is not at the patient's home this am but is able to confirm a Hospice nurse came to the home last evening for the intake visit  Raoul Pitch was able to stated the patient's CBG was 383 at bedtime  Placed outbound call to grandson Braely Stepanyan, [place] confirmed Hospice has been initiated and he is expecting a nurse to return to the home this afternoon  Assessed for patient's BG this am; he will check and call me with the results  Internal collaboration with BSW Bevelyn Ngo stating patient is not going to receive Hospice Care at this time; it is unclear if the patient is not Hospice appropriate or if the family declined;  Enrique Sack was advised the Palliative NP will make a home visit today  Scheduled a CCM f/u call to grandson and or caregiver [place] for next week  Patient Self Care Activities:   Verbalizes understanding of the education/instructions given today  Patient is currently UNABLE TO independently provide self-care or self administer medications . Attends all scheduled provider appointments (grandson and family are providing transportation) . Grandson or caregiver [place] calls pharmacy for medication refills . Needs assistance with ADL's independently . Needs assistance with IADL's independently . Leanna Battles calls provider office for new concerns or questions  Please see past updates related to this goal by clicking on the "Past Updates" button in the selected goal         Telephone follow up appointment with CCM team member scheduled for: 07/28/18   Delsa Sale, [facility] Care Management Coordinator [facility] Care Management/[facility]  Direct Phone: [phone]

## 2018-07-23 ENCOUNTER — Ambulatory Visit: Payer: Medicare Other

## 2018-07-23 ENCOUNTER — Other Ambulatory Visit: Payer: Self-pay | Admitting: Adult Health Nurse Practitioner

## 2018-07-23 DIAGNOSIS — Z598 Other problems related to housing and economic circumstances: Secondary | ICD-10-CM

## 2018-07-23 DIAGNOSIS — R630 Anorexia: Secondary | ICD-10-CM

## 2018-07-23 DIAGNOSIS — R531 Weakness: Secondary | ICD-10-CM

## 2018-07-23 DIAGNOSIS — Z599 Problem related to housing and economic circumstances, unspecified: Secondary | ICD-10-CM

## 2018-07-23 NOTE — Chronic Care Management (AMB) (Signed)
Chronic Care Management    Clinical Social Work Follow Up Note  07/23/2018 Name: BRIEANNE LAWE MRN: [id number] DOB: 1933-10-04  KENZIE EITZEN is a 85 y.o. year old female who is a primary care patient of Dorothyann Peng, MD. The CCM team was consulted for assistance with Hospice/Palliative Care Services Education.   Review of patient status, including review of consultants reports, other relevant assessments, and collaboration with appropriate care team members and the patient's provider was performed as part of comprehensive patient evaluation and provision of chronic care management services.     Goals Addressed            This Visit's Progress     Patient Stated   . "We need help with home repairs" (pt-stated)   Not on track    Grandson stated  Current Barriers:  . Financial constraints . Lacks knowledge of community resource: community housing solutions  Clinical Social Work Clinical Goal(s):  [name] Kitchen Over the next 60 days, client will work with SW to address concerns related to home modifications  CCM SW Interventions: Completed 07/23/18  Outbound call to the patients grandson by CCM SW  Communicated continued difficulty in coordinating with [employer] after multiple e-mail and phone call outreach attempts  Determined the patients family has yet to hear from referral source  Advised the patients family there may be other resources available to assist  Requested information on exact home modifications needed - [name] reports he is not sure and will have to get back to [employer] on why it is important to know exactly what is needed to help identify available resources  Scheduled follow up call with the patients grandson and caregivers in the next 7-10 days  Patient Self Care Activities:  . Attends all scheduled provider appointments . Calls pharmacy for medication refills . Calls provider office for new concerns or questions   Please see  past updates related to this goal by clicking on the "Past Updates" button in the selected goal        Other   . "I would like a Palliative Care Referral"   On track    Current Barriers:  [name] Kitchen Knowledge Deficits related to availbility and purpose of Palliative Care  Nurse Case Manager Clinical Goal(s):  [name] Kitchen Over the next 30 days, patient will verbalize understanding of plan for initiating a Palliative Care Referral for advanced care planning  CCM RN CM Interventions: Initial interventions 07/15/18   Completed a CCM RN Telephone Follow Up with grandson Ward Ammirati and patient; Ms. Lifton gave verbal consent to speak with caregiver Lessie Dings . Provided education to patient re: Palliative Care Services and process for sending a referral . Collaborated with Dr. Allyne Gee regarding need for a Palliative Care Referral for advanced care planning  . Discussed plans with patient for ongoing care management follow up and provided patient with direct contact information for care management team  CCM SW Interventions: Completed 07/23/18  Inbound call from Palliative Care RN navigator Beryle Beams who reports the patient would have an in person palliative care intake visit with NP Amy on Friday at 4:00 pm  Outbound call to the patients grandson [name] to confirm knowledge of appointment  Planned follow up call within the next 7-10 days   Patient Self Care Activities:   Patient is currently UNABLE TO independently provide self-care or self administer medications . Attends all scheduled provider appointments (grandson and family are providing transportation) . Grandson or caregiver  [name] calls pharmacy for medication refills . Needs assistance with ADL's independently . Needs assistance with IADL's independently . Leanna BattlesGrandson Brandon calls provider office for new concerns or questions   Please see past updates related to this goal by clicking on the "Past Updates" button in the selected goal            Follow Up Plan: SW will follow up with patient by phone over the next 7-10 days   Bevelyn NgoKendra Derrick Tiegs, [place], CDP [facility] Care Management Social Worker [phone]  Total time spent performing care coordination and/or care management activities with the patient by phone or face to face = 18 minutes.

## 2018-07-23 NOTE — Patient Instructions (Signed)
Social Worker Visit Information  Goals we discussed today:  Goals Addressed            This Visit's Progress     Patient Stated   . "We need help with home repairs" (pt-stated)   Not on track    Grandson stated  Current Barriers:  . Financial constraints . Lacks knowledge of community resource: community housing solutions  Clinical Social Work Clinical Goal(s):  [name] Kitchen. Over the next 60 days, client will work with SW to address concerns related to home modifications  CCM SW Interventions: Completed 07/23/18  Outbound call to the patients grandson by CCM SW  Communicated continued difficulty in coordinating with [employer] after multiple e-mail and phone call outreach attempts  Determined the patients family has yet to hear from referral source  Advised the patients family there may be other resources available to assist  Requested information on exact home modifications needed - Apolinar JunesBrandon reports he is not sure and will have to get back to [employer]  Educated Brandon on why it is important to know exactly what is needed to help identify available resources  Scheduled follow up call with the patients grandson and caregivers in the next 7-10 days  Patient Self Care Activities:  . Attends all scheduled provider appointments . Calls pharmacy for medication refills . Calls provider office for new concerns or questions   Please see past updates related to this goal by clicking on the "Past Updates" button in the selected goal        Other   . "I would like a Palliative Care Referral"   On track    Current Barriers:  [name] Kitchen. Knowledge Deficits related to availbility and purpose of Palliative Care  Nurse Case Manager Clinical Goal(s):  [name] Kitchen. Over the next 30 days, patient will verbalize understanding of plan for initiating a Palliative Care Referral for advanced care planning  CCM RN CM Interventions: Initial interventions 07/15/18   Completed a CCM RN Telephone Follow Up  with grandson Katherine RoanBrandon Gruel and patient; Ms. Jyl HeinzChavis gave verbal consent to speak with caregiver Lessie DingsSierra Haith . Provided education to patient re: Palliative Care Services and process for sending a referral . Collaborated with [name] regarding need for a Palliative Care Referral for advanced care planning  . Discussed plans with patient for ongoing care management follow up and provided patient with direct contact information for care management team  CCM SW Interventions: Completed 07/23/18  Inbound call from Palliative Care RN navigator Beryle BeamsBetty Senor who reports the patient would have an in person palliative care intake visit with NP Amy on Friday at 4:00 pm  Outbound call to the patients grandson Apolinar JunesBrandon to confirm knowledge of appointment  Planned follow up call within the next 7-10 days   Patient Self Care Activities:   Patient is currently UNABLE TO independently provide self-care or self administer medications . Attends all scheduled provider appointments (grandson and family are providing transportation) . Grandson or caregiver [name] calls pharmacy for medication refills . Needs assistance with ADL's independently . Needs assistance with IADL's independently . Leanna BattlesGrandson Brandon calls provider office for new concerns or questions   Please see past updates related to this goal by clicking on the "Past Updates" button in the selected goal           Materials Provided: Verbal education about resources for home modifications provided by phone  Follow Up Plan: SW will follow up with patient by phone over the next 7-10 days  Bevelyn Ngo, BSW, CDP [facility] / [facility] Care Management Social Worker [phone]

## 2018-07-23 NOTE — Progress Notes (Signed)
Designer, [facility] Palliative Care Consult Note Telephone: [phone]  Fax: [phone]  PATIENT NAME: Helen Richardson DOB: [birth year] MRN: [id number]  PRIMARY CARE PROVIDER:   Glendale Chard, MD  REFERRING PROVIDER:  Glendale Chard, MD [address] [place], [place] [address]  RESPONSIBLE PARTY:   Desiraye Rolfson, grandson Ozora, caregiver [phone]      RECOMMENDATIONS and PLAN:  1.  CHF.  Patient has history of CHF and was recently seen in ED on 4/30 for increased edema.  Workup was negative for fluid overload. Family states that a DVT workup was negative but do not see DVT imaging noted in Epic, this may have been done outside of [facility].  Kidney function was good with BUN at 13 and creatinine at 0.92 and  eGFR >60. CBC does show anemia more likely related to chronic disease.  CBC on 06/11/2018 showed total protein of 6.3 and albumin of 2.0.  Though her breathing is somewhat labored today she denies cough, fever, chest pain.  Her O2 sats are 94% on RA and her breathing slows when taking slow deep breaths. Patient is currently on lasix 40 mg daily, telmisartan 80 mg daily, carvedilol 6.25 mg BID, rosuvastatin 20 mg daily, and aspirin 81 mg daily. Recommend cardiac workup to make sure this is not a progression of her heart failure.    2.  DMT2.  She was taken off her Tradjenta as a side effect of this is swelling.  Family does report that the swelling has gone quite a bit but she still has some.  Her blood sugars have been ranging 118- 292 fasting in the morning.  She is currently on Numalog 75/25 25 units BID.  On 5/13 her HA1C was 10.2.  May need to add a long acting insulin at night to get better control of her fasting blood sugars and help to bring down her HA1C.  3.  Pain. Family does report that her breathing gets faster when she is pain but she denies pain at this time.  She does have tramadol for pain and family  reports that she may take 1-2 tabs during the day for the pain with relief.  Family concerned about her becoming addicted.  Reassured them that as long as she is taking it as directed and with no more than she is actually taking, she would be at very low risk for addiction.  Continue current pain regimen as this is working with side effects of nausea or sedation.  Patient is being worked up for autoimmune disorders with her PCP as possible cause for the pain.  4.  Goals of care.  Patient is full code and did not go into ACP conversation today.  Patient is working with PT/OT.  Family does report that she did have 3 weeks of therapy and it was stopped but resumed again this week.  Family wants to continue with therapy at this time to see if this helps.  Patient has had a drastic decline in mobility in such a short time without clear etiology.  Believe that patient needs to have further workup at PCP and cardiology to rule out/in autoimmune or cardiac disorders.  Did discuss with family that therapy was a good option right now to see if it does help but further workup may  give [place] a better picture of what is going on.  Did explain that if  the therapy did not help with her mobility and she continues to have decreased appetite we may have to reevaluate goals of care with the patient.  Family does have an aide that comes into the home in the morning and then a family member comes in and sits with her.  Family is paying for the aide out of pocket and could use some assistance.  Will reach out to SW with possible resources.   I spent 90 minutes providing this consultation,  from 4:30 to 6:00. More than 50% of the time in this consultation was spent coordinating communication.   HISTORY OF PRESENT ILLNESS:  Helen Richardson is a 85 y.o. year old female with multiple medical problems including DMT2, CHF, HTN. Palliative Care was asked to help address goals of care.   As reported by family patient was independent up til  about 2 months ago. She had two UTIs and then started having groin pain and pain in her hands.  This caused her mobility to decrease.  She is continent but is having more accidents as she does not get to the bathroom in time due to the decreased mobility.  Up until this week she has been eating well but this week her appetite has decreased and she is not eating much.  No significant weight changes reported.  Family does report that 2 days ago she had a bad day in which she wasn't conversing as much and she wasn't eating.  Hospice was called in by [facility] and PCP and she was not admitted as she is doing PT/OT.    CODE STATUS: Full Code  PPS: 50% HOSPICE ELIGIBILITY/DIAGNOSIS: TBD  PHYSICAL EXAM:    General: patient sitting up in chair in some mild distress as her breathing is somewhat labored Cardiovascular: regular rate and rhythm; somewhat tachycardic with HR of 98 Pulmonary: lung sounds clear; no use of accessory muscles but she is breathing heavy but this slows as she is encouraged to slowly breathe through her nose and out her mouth. O2 sats at 94% on RA Abdomen: soft, nontender, + bowel sounds GU: No CVA tenderness, no suprapubic tenderness or distention  Extremities: trace pitting edema to bilateral feet and mild to moderate nonpitting edema to bilateral hands and lower arms, no joint deformities Skin: no rashes Neurological: Weakness but otherwise nonfocal.  A&Ox3  PAST MEDICAL HISTORY:  Past Medical History:  Diagnosis Date  . CHF (congestive heart failure) ([place])   . Diabetes mellitus without complication ([place])   . High cholesterol   . HTN (hypertension)   . Obesity     SOCIAL HX:  Social History   Tobacco Use  . Smoking status: Never Smoker  . Smokeless tobacco: Never Used  Substance Use Topics  . Alcohol use: No    ALLERGIES: No Known Allergies   PERTINENT MEDICATIONS:  Outpatient Encounter Medications as of 07/23/2018  Medication Sig  . ADVOCATE LANCETS MISC   .  Alcohol Swabs (ALCOHOL PREP) 70 % PADS   . aspirin 81 MG chewable tablet Chew 81 mg by mouth at bedtime.  . Calcium Carbonate-Vitamin D (CALTRATE 600+D PO) Take 1 tablet by mouth daily.  . carvedilol (COREG) 6.25 MG tablet TAKE 1 TABLET BY MOUTH TWICE DAILY (Patient taking differently: Take 6.25 mg by mouth 2 (two) times daily with a meal. )  . COMFORT EZ PEN NEEDLES 32G X 4 MM MISC   . diclofenac sodium (VOLTAREN) 1 % GEL APPLY TO AFFECTED AREA 3 TIMES DAILY AS  NEEDED (Patient taking differently: Apply 2 g topically 3 (three) times daily as needed (pain). )  . furosemide (LASIX) 40 MG tablet Take 40 mg by mouth daily.   . Insulin Lispro Prot & Lispro (HUMALOG MIX 75/25 KWIKPEN) (75-25) 100 UNIT/ML Kwikpen Inject 25 Units into the skin 2 (two) times daily before a meal. (Patient taking differently: Inject 30 Units into the skin 2 (two) times daily before a meal. )  . Lancet Devices (ADVOCATE LANCING DEVICE) MISC   . potassium chloride SA (K-DUR,KLOR-CON) 20 MEQ tablet TAKE 1 TABLET BY MOUTH TWICE A DAY (Patient taking differently: Take 20 mEq by mouth 2 (two) times daily. )  . rosuvastatin (CRESTOR) 20 MG tablet TAKE 1 TABLET BY MOUTH EVERY DAY (Patient taking differently: Take 20 mg by mouth daily. )  . telmisartan (MICARDIS) 80 MG tablet TAKE 1 TABLET BY MOUTH ONCE DAILY (Patient taking differently: Take 80 mg by mouth daily. )  . TRADJENTA 5 MG TABS tablet TAKE 1 TABLET BY MOUTH EVERY DAY  . traMADol (ULTRAM) 50 MG tablet Take 1 tablet (50 mg total) by mouth every 6 (six) hours as needed.   No facility-administered encounter medications on file as of 07/23/2018.       Anjali Manzella Jenetta Downer, NP

## 2018-07-28 ENCOUNTER — Other Ambulatory Visit: Payer: Medicare Other | Admitting: Adult Health Nurse Practitioner

## 2018-07-28 ENCOUNTER — Telehealth: Payer: Medicare Other

## 2018-07-28 ENCOUNTER — Other Ambulatory Visit: Payer: Medicare Other | Admitting: Licensed Clinical Social Worker

## 2018-07-28 ENCOUNTER — Other Ambulatory Visit: Payer: Self-pay

## 2018-07-28 ENCOUNTER — Ambulatory Visit: Payer: Self-pay

## 2018-07-28 DIAGNOSIS — I10 Essential (primary) hypertension: Secondary | ICD-10-CM | POA: Diagnosis not present

## 2018-07-28 DIAGNOSIS — M2550 Pain in unspecified joint: Secondary | ICD-10-CM

## 2018-07-28 DIAGNOSIS — E1122 Type 2 diabetes mellitus with diabetic chronic kidney disease: Secondary | ICD-10-CM | POA: Diagnosis not present

## 2018-07-28 DIAGNOSIS — Z515 Encounter for palliative care: Secondary | ICD-10-CM

## 2018-07-28 DIAGNOSIS — N183 Chronic kidney disease, stage 3 unspecified: Secondary | ICD-10-CM

## 2018-07-28 DIAGNOSIS — Z794 Long term (current) use of insulin: Secondary | ICD-10-CM | POA: Diagnosis not present

## 2018-07-28 NOTE — Progress Notes (Signed)
[facility] Consult Note Telephone: [phone]([phone]  Fax: [phone]  PATIENT NAME: Helen Richardson DOB: 05/01/1933 MRN: [id number]  PRIMARY CARE PROVIDER:   [name], [name], MD  REFERRING PROVIDER:  [name], [name], MD [address] [place] [address]  RESPONSIBLE PARTY:  Katherine RoanBrandon Jeffrey, grandson [phone]                                              Lessie DingsSierra Haith, caregiver [phone]       RECOMMENDATIONS and PLAN:  1.  CHF.  Patient still having mild labored breathing though she walked from the car to her house today but even after a resting for a while she still had some pursed lip breathing.  Though this is improved from our visit last week.    2. DMT2.  Patient's granddaughter is with her and states that her blood sugars have been a little high this week.  States that her appetite has improved and she is eating better.  Received a cortisone injection to right shoulder today and this may cause her blood sugars to increase as well.  Will have to have close monitoring of blood sugars with cortisone injection and possibly may need to add a long acting insulin at night for better blood sugar control as they were reported as being high before the injection.  3.  Mobility.  Observed patient getting out of car and walking to house.  She uses a cane but is able to ambulate slowly with only stand by assist.  She did have some difficulty stepping the one step into the house. As stated above patient received injection to right shoulder.  She denies pain at this time.  Ortho has stated to patient that the swelling in her arms may be due to her recent inactivity.  She continues to work with PT/OT and the swelling has gone some since last week.  Continue current PT/OT orders and pain regimen.    4.  Goals of care.  Usual caregivers were not at visit today.  Left card with cell number on it to arrange appointment in which we could  discuss ACP.  I spent 30 minutes providing this consultation,  from 3:30 to 4:00. More than 50% of the time in this consultation was spent coordinating communication.   HISTORY OF PRESENT ILLNESS:  Helen Kosellie H Hamid is a 85 y.o. year old female with multiple medical problems including DMT2, CHF, HTN. Palliative Care was asked to help address goals of care.   As reported by family patient was independent up til about 2 months ago. She had two UTIs and then started having groin pain and pain in her hands.  This caused her mobility to decrease.  She is continent but is having more accidents as she does not get to the bathroom in time due to the decreased mobility.  Up until this week she has been eating well but this week her appetite has decreased and she is not eating much.  No significant weight changes reported.  Family does report that 2 days ago she had a bad day in which she wasn't conversing as much and she wasn't eating.  [facility] and she was not admitted as she is doing PT/OT.    CODE STATUS: Full code  PPS: 50% HOSPICE  ELIGIBILITY/DIAGNOSIS: TBD  PHYSICAL EXAM:   General: patient sitting in chair in NAD Cardiovascular: regular rate and rhythm Pulmonary: lung sounds clear, normal respiratory effort.  Some labored breathing but improved from last week Abdomen: soft, nontender, + bowel sounds GU: no suprapubic tenderness Extremities:trace edema to feet, still has some mild nonpitting edema to arms and hands, no joint deformities Skin: no rashes Neurological: Weakness but otherwise nonfocal, A&Ox3  PAST MEDICAL HISTORY:  Past Medical History:  Diagnosis Date  . CHF (congestive heart failure) ([facility])   . Diabetes mellitus without complication ([facility])   . High cholesterol   . HTN (hypertension)   . Obesity     SOCIAL HX:  Social History   Tobacco Use  . Smoking status: Never Smoker  . Smokeless tobacco: Never Used  Substance Use Topics  . Alcohol use: No     ALLERGIES: No Known Allergies   PERTINENT MEDICATIONS:  Outpatient Encounter Medications as of 07/28/2018  Medication Sig  . ADVOCATE LANCETS MISC   . Alcohol Swabs (ALCOHOL PREP) 70 % PADS   . aspirin 81 MG chewable tablet Chew 81 mg by mouth at bedtime.  . Calcium Carbonate-Vitamin D (CALTRATE 600+D PO) Take 1 tablet by mouth daily.  . carvedilol (COREG) 6.25 MG tablet TAKE 1 TABLET BY MOUTH TWICE DAILY (Patient taking differently: Take 6.25 mg by mouth 2 (two) times daily with a meal. )  . COMFORT EZ PEN NEEDLES 32G X 4 MM MISC   . diclofenac sodium (VOLTAREN) 1 % GEL APPLY TO AFFECTED AREA 3 TIMES DAILY AS NEEDED (Patient taking differently: Apply 2 g topically 3 (three) times daily as needed (pain). )  . furosemide (LASIX) 40 MG tablet Take 40 mg by mouth daily.   . Insulin Lispro Prot & Lispro (HUMALOG MIX 75/25 KWIKPEN) (75-25) 100 UNIT/ML Kwikpen Inject 25 Units into the skin 2 (two) times daily before a meal. (Patient taking differently: Inject 30 Units into the skin 2 (two) times daily before a meal. )  . Lancet Devices (ADVOCATE LANCING DEVICE) MISC   . potassium chloride SA (K-DUR,KLOR-CON) 20 MEQ tablet TAKE 1 TABLET BY MOUTH TWICE A DAY (Patient taking differently: Take 20 mEq by mouth 2 (two) times daily. )  . rosuvastatin (CRESTOR) 20 MG tablet TAKE 1 TABLET BY MOUTH EVERY DAY (Patient taking differently: Take 20 mg by mouth daily. )  . telmisartan (MICARDIS) 80 MG tablet TAKE 1 TABLET BY MOUTH ONCE DAILY (Patient taking differently: Take 80 mg by mouth daily. )  . TRADJENTA 5 MG TABS tablet TAKE 1 TABLET BY MOUTH EVERY DAY  . traMADol (ULTRAM) 50 MG tablet Take 1 tablet (50 mg total) by mouth every 6 (six) hours as needed.   No facility-administered encounter medications on file as of 07/28/2018.       Cheyann Blecha Marlena Clipper, NP

## 2018-07-28 NOTE — Patient Instructions (Signed)
Visit Information  Goals Addressed            This Visit's Progress     Patient Stated   . I don't know if insulin regimen is working (pt-stated)       Current Barriers:  [name] Kitchen Knowledge Deficits related to managing diabetes  Pharmacist Clinical Goal(s):  [name] Kitchen Over the next 30 days, patient will demonstrate Improved medication adherence as evidenced by BGs within range, FBG<130, no hypoglycemia . Over the next 30 days, patient will work with CCM team to address needs related to managing diabetes  CCM RN CM Interventions: Completed on 07/21/18   Completed a CCM RN Telephone Follow Up with caregiver Lessie Dings . [place] advised the patient has a poor appetite and her BS dropped to 39, [place] followed instructions previously discussed with last RNCM call; BG 15 min recheck= 64; caregiver repeated juice and gave 1 hershey kiss chocolate candy; caregiver instructed to do 15 min recheck and hold evening insulin . Discussed plans with patient for ongoing care management follow up and provided patient with direct contact information for care management team . Collaboration with embedded Pharm D Vanice Sarah re: unstable BGs reported by caregiver   CCM RN PharmD Interventions: Completed on 07/21/18   Call placed to [place] around 6pm--> rechecked BGs and BGs now at 131, 150.  Patient eating a hearty portion of chicken, greens, rice.  Advised patient to resume 50% of dose tonight and resume normal insulin dose in the AM if BGs within goal range.  Instructed her to call office if BGs still not controlled.  Counseled on S/SX hypoglycemia  Appears patient is not controlled on humalog mix.  Would prefer Basal insulin with meal time coverage to avoid hypo/hyperglycemic events, however will discuss with PCP.  Will need to see family plan for palliative care, etc to make future decisions regarding medication management.  Removed Tradjenta from medication list and placed "intolerance" in the chart-->pt  experienced swelling.  Patient Self Care Activities:  . Currently UNABLE TO independently manage diabetes; chronic conditions  Please see past updates related to this goal by clicking on the "Past Updates" button in the selected goal      . I need help paying for my insulin & Tradjenta (pt-stated)       Current Barriers:  . Financial Barriers  Pharmacist Clinical Goal(s):  [name] Kitchen Over the next 21 days, patient will work with CCM Pharmacist to address needs related to financial assistance for insulin  Interventions: . Collaboration with [facility] CPhT, Lilla Shook that will be assisting with applying for patient assistance programs  . Patient application mailed last week for [employer] (Humalog Kwikpen 75/25)  . Spoke with [place] (caregiver) regarding application for [employer].  I asked her to call me back when she has had a chance to review documents.  CCM Pharmacist will walk her through application if additional support is needed.    Patient Self Care Activities:  . Currently UNABLE TO independently manage diabetes . Attends all scheduled provider appointments  Please see past updates related to this goal by clicking on the "Past Updates" button in the selected goal         The patient verbalized understanding of instructions provided today and declined a print copy of patient instruction materials.   The CM team will reach out to the patient again over the next 7-10 business days.   [name], PharmD, BCPS Clinical Pharmacist, [facility]  [facility]  Direct Dial: [phone]

## 2018-07-28 NOTE — Progress Notes (Signed)
Chronic Care Management   Visit Note  07/21/2018 Name: Helen Richardson MRN: [id number] DOB: 07/17/1933  Referred by: [name], [name], MD  Reason for referral : Chronic care management-->medication management   Helen Richardson is a 85 y.o. year old female who is a primary care patient of [name], [name], MD. The CCM team was consulted for assistance with chronic disease management and care coordination needs.   Review of patient status, including review of consultants reports, relevant laboratory and other test results, and collaboration with appropriate care team members and the patient's provider was performed as part of comprehensive patient evaluation and provision of chronic care management services.    Objective:   Goals Addressed            This Visit's Progress     Patient Stated   . I don't know if insulin regimen is working (pt-stated)       Current Barriers:  [name] Kitchen. Knowledge Deficits related to managing diabetes  Pharmacist Clinical Goal(s):  [name] Kitchen. Over the next 30 days, patient will demonstrate Improved medication adherence as evidenced by BGs within range, FBG<130, no hypoglycemia . Over the next 30 days, patient will work with CCM team to address needs related to managing diabetes  CCM RN CM Interventions: Completed on 07/21/18   Completed a CCM RN Telephone Follow Up with caregiver [name] advised the patient has a poor appetite and her BS dropped to 8859, [name] followed instructions previously discussed with last RNCM call; BG 15 min recheck= 64; caregiver repeated juice and gave 1 hershey kiss chocolate candy; caregiver instructed to do 15 min recheck and hold evening insulin . Discussed plans with patient for ongoing care management follow up and provided patient with direct contact information for care management team . Collaboration with embedded Pharm D Vanice SarahJulie Jiro Kiester re: unstable BGs reported by caregiver   CCM PharmD Interventions: Completed on 07/21/18    Call placed to [name] around 6pm--> rechecked BGs and BGs now at 131, 150.  Patient eating a hearty portion of chicken, greens, rice.  Advised patient to resume 50% of dose tonight and resume normal insulin dose in the AM if BGs within goal range.  Instructed her to call office if BGs still not controlled.  Counseled on S/SX hypoglycemia  Appears patient is not controlled on humalog mix.  Would prefer Basal insulin with meal time coverage to avoid hypo/hyperglycemic events, however will discuss with PCP.  Will need to see family plan for palliative care, etc to make future decisions regarding medication management.  Removed Tradjenta from medication list and placed "intolerance" in the chart-->pt experienced swelling.  Patient Self Care Activities:  . Currently UNABLE TO independently manage diabetes; chronic conditions  Please see past updates related to this goal by clicking on the "Past Updates" button in the selected goal      . I need help paying for my insulin  (pt-stated)       Current Barriers:  . Financial Barriers  Pharmacist Clinical Goal(s):  [name] Kitchen. Over the next 21 days, patient will work with CCM Pharmacist to address needs related to financial assistance for insulin  Interventions: . Collaboration with [facility] CPhT, Lilla Shookshley Coleman that will be assisting with applying for patient assistance programs  . Patient application mailed last week for [employer] (Humalog Kwikpen 75/25)  . Spoke with [name] (caregiver) regarding application for [employer].  I asked her to call me back when she has had a chance to review documents.  CCM Pharmacist will walk her through application if additional support is needed.    Patient Self Care Activities:  . Currently UNABLE TO independently manage diabetes . Attends all scheduled provider appointments  Please see past updates related to this goal by clicking on the "Past Updates" button in the selected goal         Plan:   The CM team will  reach out to the patient again over the next 7-10 bussiness days.    [name], PharmD, BCPS Clinical Pharmacist, [facility]  [facility]  Direct Dial: [phone]

## 2018-07-29 ENCOUNTER — Other Ambulatory Visit: Payer: Self-pay

## 2018-07-29 NOTE — Progress Notes (Signed)
COMMUNITY PALLIATIVE CARE SW NOTE  PATIENT NAME: Helen Richardson DOB: 1933-05-23 MRN: [id number]  PRIMARY CARE PROVIDER: Dorothyann Peng, MD  RESPONSIBLE PARTY:  Acct ID - Guarantor Home Phone Work Phone Relationship Acct Type  [id number] - Hable,NELL* [phone]  Self P/F     [address], [place], [place] [address]   Due to the COVID-19 crisis, this virtual check-in visit was done via telephone from my office and it was initiated and consent given by thispatient and or family.  PLAN OF CARE and INTERVENTIONS:             1. GOALS OF CARE/ ADVANCE CARE PLANNING:  Family wishes to keep patient in her home.  Patient is a full code. 2. SOCIAL/EMOTIONAL/SPIRITUAL ASSESSMENT/ INTERVENTIONS:  SW conducted a Biomedical engineer visit with patient's granddaughter-in-law, Ciara, per the request of Palliative Care NP, Amy Garner Nash.  Although patient has a CNA in the mornings to assist her, the other family members are caring for her the remainder of the day.  SW provided active listening and supportive counseling while exploring resources.  Ciara does not think patient will respond to adult care centers in the future.  The family applied for Medicaid, but patient does not meet the financial criteria.  SW emailed [place] an Radio broadcast assistant.  Patient is currently receiving PT, OT and RN visits.  Charlynne Cousins is also a CNA and provides needed education to other family members in caring for patient.  Encouraged Ciara to participate in self care during this time and she agreed. 3. PATIENT/CAREGIVER EDUCATION/ COPING:  Family expresses feelings openly. 4. PERSONAL EMERGENCY PLAN:  Family will call EMS. 5. COMMUNITY RESOURCES COORDINATION/ HEALTH CARE NAVIGATION:  Patient currently receives home health from [facility]. 6. FINANCIAL/LEGAL CONCERNS/INTERVENTIONS:  Patient is on a fixed income.     SOCIAL HX:  Social History   Tobacco Use  . Smoking status: Never Smoker  . Smokeless tobacco: Never Used  Substance  Use Topics  . Alcohol use: No    CODE STATUS:  Full Code  ADVANCED DIRECTIVES: N MOST FORM COMPLETE:  N HOSPICE EDUCATION PROVIDED:  N PPS:  Aide assists patient with bathing, dressing and breakfast. Duration of visit and documentation:  45 minutes.      Vella Kohler Isabell Bonafede, LCSW

## 2018-07-30 ENCOUNTER — Telehealth: Payer: Self-pay | Admitting: Internal Medicine

## 2018-07-30 ENCOUNTER — Other Ambulatory Visit: Payer: Self-pay

## 2018-07-30 ENCOUNTER — Ambulatory Visit: Payer: Self-pay

## 2018-07-30 ENCOUNTER — Other Ambulatory Visit: Payer: Self-pay | Admitting: Internal Medicine

## 2018-07-30 ENCOUNTER — Ambulatory Visit: Payer: Self-pay | Admitting: Pharmacist

## 2018-07-30 ENCOUNTER — Telehealth: Payer: Medicare Other

## 2018-07-30 DIAGNOSIS — I10 Essential (primary) hypertension: Secondary | ICD-10-CM | POA: Diagnosis not present

## 2018-07-30 DIAGNOSIS — E1122 Type 2 diabetes mellitus with diabetic chronic kidney disease: Secondary | ICD-10-CM

## 2018-07-30 DIAGNOSIS — N183 Chronic kidney disease, stage 3 unspecified: Secondary | ICD-10-CM

## 2018-07-30 DIAGNOSIS — Z794 Long term (current) use of insulin: Secondary | ICD-10-CM | POA: Diagnosis not present

## 2018-07-30 DIAGNOSIS — M2550 Pain in unspecified joint: Secondary | ICD-10-CM

## 2018-07-30 MED ORDER — INSULIN DEGLUDEC 200 UNIT/ML ~~LOC~~ SOPN: 34.0000 [IU] | PEN_INJECTOR | Freq: Every evening | SUBCUTANEOUS | 1 refills | Status: DC

## 2018-07-30 NOTE — Chronic Care Management (AMB) (Signed)
  Chronic Care Management   Follow Up Note   07/30/2018 Name: Helen Richardson MRN: [id number] DOB: September 14, 1933  Referred by: Dorothyann Peng, MD Reason for referral : Chronic Care Management (CCM RN CM Telephone Follow Up w/[facility])   [name] is a 85 y.o. year old female who is a primary care patient of Dorothyann Peng, MD. The CCM team was consulted for assistance with chronic disease management and care coordination needs.    Review of patient status, including review of consultants reports, relevant laboratory and other test results, and collaboration with appropriate care team members and the patient's provider was performed as part of comprehensive patient evaluation and provision of chronic care management services.   I spoke with the nurse manager from [facility] by telephone to obtain a patient status update.   Goals Addressed    . High Risk for readmission        Current Barriers:  [name] Kitchen Knowledge Deficits related to prevention of UTI and dehyration  Nurse Case Manager Clinical Goal(s):  [name] Kitchen Over the next 30 days, patient will not experience hospital admission. Hospital Admissions in last 6 months = 3  CCM RN CM Interventions:  Completed on 07/30/18 with nurse manager Macario Carls from [facility] [phone]   Confirmed orders remain in place for patient to receive in home SNV once weekly, PT/OT have not worked with patient this week due to patient's pain and immobility  Discussed patient is now taking Tramadol for pain and is reporting good effectiveness; discussed patient received a Cortisone injection in her shoulder by Orthopedics this week and was advised her pain and swelling may be related to decreased ROM and immobility   Discussed patient's assigned nurse is Andre Lefort [phone]  Discussed Palliative Care is in the home; discussed patient may benefit from ongoing PT/OT services now that her pain is better controlled  Discussed nurse noted  patient had a low BG yesterday due to not eating-nurse instructed caregiver accordingly  Nurse manager will provide an update to the home care team assigned to Ms. Mendell  Patient Self Care Activities:   Verbalizes understanding of the education/instructions given today  Patient is currently UNABLE TO independently provide self-care or self administer medications . Attends all scheduled provider appointments (grandson and family are providing transportation) . Grandson or caregiver [place] calls pharmacy for medication refills . Needs assistance with ADL's independently . Needs assistance with IADL's independently . Leanna Battles calls provider office for new concerns or questions  Please see past updates related to this goal by clicking on the "Past Updates" button in the selected goal         Telephone follow up appointment with care management team member scheduled for: 08/03/18   Delsa Sale, [facility] Care Management Coordinator [facility] Care Management/[facility]  Direct Phone: [phone]

## 2018-07-30 NOTE — Chronic Care Management (AMB) (Signed)
Chronic Care Management   Follow Up Note   07/28/2018 Name: Helen Richardson MRN: [id number] DOB: 1933/06/18  Referred by: Dorothyann Peng, MD Reason for referral : Chronic Care Management (CCM RN CM Telephone Follow Up)   Helen Richardson is a 85 y.o. year old female who is a primary care patient of Dorothyann Peng, MD. The CCM team was consulted for assistance with chronic disease management and care coordination needs.    Review of patient status, including review of consultants reports, relevant laboratory and other test results, and collaboration with appropriate care team members and the patient's provider was performed as part of comprehensive patient evaluation and provision of chronic care management services.    I spoke with patient's caregiver Helen Richardson by telephone today.   Goals Addressed    . "Her pain is so bad from her arthritis"   Not on track    Current Barriers:  [name] Kitchen Knowledge Deficits related to pain management for Arthritis  Nurse Case Manager Clinical Goal(s):  [name] Kitchen Over the next 30 days, patient will demonstrate a decrease in Pain exacerbations as evidenced by patient will exhibit increased and improved physical mobility and will be able to particpate in PT  CCM RN CM Interventions:  Completed on 07/28/18: completed call with caregiver Helen Richardson  . Evaluation of current treatment plan related to pain management for arthritis and patient's adherence to plan as established by provider. . Confirmed patient is taking Tramadol as directed for pain; assessed for effectiveness of pain med . Discussed patient continues to have shoulder pain and follow up with an Orthopedic doctor today . Assessed for readmission of in-home PT; per Helen Richardson the PT have restarted . Discussed plans with patient for ongoing care management follow up and provided patient with direct contact information for care management team   Patient Self Care Activities:   Patient is currently UNABLE TO  independently provide self-care or self administer medications . Attends all scheduled provider appointments (grandson and family are providing transportation) . Grandson or caregiver Helen Richardson calls pharmacy for medication refills . Needs assistance with ADL's independently . Needs assistance with IADL's independently . Helen Richardson calls provider office for new concerns or questions  Please see past updates related to this goal by clicking on the "Past Updates" button in the selected goal      . "I think my grandmother has CHF"   Not on track    Grandson stated:   Current Barriers:  [name] Kitchen Knowledge Deficits related to cause for lower leg edema and recent fall   Nurse Case Manager Clinical Goal(s):  [name] Kitchen Over the next 90 days, patient and grandson Helen Richardson will work with the CCM team to address care coordination needs and disease specific education needs for health related comorbidies and how to self manage. 07/15/18-Target goal date changed to 90 days  CCM RN CM Interventions:  Completed call with caregiver Helen Richardson on 07/28/18   Assessed for adherence to daily weights -- Helen Richardson states the aide caring for Ms. Latulippe in the am is not being consistent with obtaining daily weights -- reinforced the importance and rationale for purpose of consistently checking and recording daily weights first thing in the morning around the same time, on the same scale after patient empties her bladder and with a few clothes on as possible -- Helen Richardson verbalizes understanding and will reinforce this with the am caregiver  Reinforced verbal education related to other s/s suggestive of CHF, including increased edema to lower  extremities, hands or abdomen, increased shortness of breath, cough, nausea/vomiting and reinforced when to report symptoms  Reviewed guidelines for weight gain of 3 lbs in one day and or 5 lbs in one week  Reinforced importance of patient taking her fluid pill exactly as prescribed   Reinforced importance of adhering to a low Sodium diet . Discussed plans with patient for ongoing care management follow up and provided patient with direct contact information for care management team  Patient Self Care Activities:  Helen Richardson. Grandson Helen Richardson and caregiver Helen Richardson verbalizes understanding of the education/information provided during today's call   Patient is currently UNABLE TO independently provide self-care or self administer medications . Attends all scheduled provider appointments (grandson and family are providing transportation) . Grandson or caregiver Helen Richardson calls pharmacy for medication refills . Needs assistance with ADL's independently . Needs assistance with IADL's independently . Helen BattlesGrandson Helen calls provider office for new concerns or questions  Please see past updates related to this goal by clicking on the "Past Updates" button in the selected goal     . "I would like a Palliative Care Referral"   On track    Current Barriers:  [name] Kitchen. Knowledge Deficits related to availbility and purpose of Palliative Care  Nurse Case Manager Clinical Goal(s):  [name] Kitchen. Over the next 30 days, patient will verbalize understanding of plan for initiating a Palliative Care Referral for advanced care planning  CCM RN CM Interventions: Completed on 07/28/18: completed call with caregiver Helen Richardson  . Discussed patient's Palliative Care status with caregiver Helen Richardson . Discussed Palliative Care will provide services; NP Amy Garner NashDaniels completed initial home visit . Discussed that NP Amy Garner NashDaniels was scheduled to complete next visit today but was missed due to patient was a MD appt  . Instructed caregiver to notify the NP in the future ahead of time to coordinate home visits with any MD appts the patient may have in order to not miss the scheduled home visit -- she verbalizes understanding . Discussed plans with patient for ongoing care management follow up and provided patient with direct  contact information for care management team  CCM SW Interventions: Completed 07/23/18  Inbound call from Palliative Care RN navigator Helen Richardson who reports the patient would have an in person palliative care intake visit with NP Amy on Friday at 4:00 pm  Outbound call to the patients grandson Helen JunesBrandon to confirm knowledge of appointment  Planned follow up call within the next 7-10 days   Patient Self Care Activities:   Patient is currently UNABLE TO independently provide self-care or self administer medications . Attends all scheduled provider appointments (grandson and family are providing transportation) . Grandson or caregiver Helen Richardson calls pharmacy for medication refills . Needs assistance with ADL's independently . Needs assistance with IADL's independently . Helen BattlesGrandson Helen calls provider office for new concerns or questions  Please see past updates related to this goal by clicking on the "Past Updates" button in the selected goal      . High Risk for readmission    On track    Current Barriers:  [name] Kitchen. Knowledge Deficits related to prevention of UTI and dehyration  Nurse Case Manager Clinical Goal(s):  [name] Kitchen. Over the next 30 days, patient will not experience hospital admission. Hospital Admissions in last 6 months = 3  CCM RN CM Interventions:  Completed on 07/28/18 with caregiver Helen Richardson   Assessed for s/s suggestive of dehydration; encouraged to push fluids, water, broth, jello, popsicles  Assessed  for frequency of urination; discussed urine should be clear yellow  Reinforced importance of keeping patient well hydrated to help improve kidney function and improve overall health   Assessed ongoing monitoring of CBG's daily before meals-- Helen Richardson has not been caring for Ms. Hinnant for the past 5 days, she is unaware of her BG levels during this time  Encouraged Helen Richardson to notify RNCM or Pharm D of patient's recorded CBG's as soon as she returns to the patient's home (Helen Richardson  agreed, states she will return today) . Discussed plans with patient for ongoing care management follow up and provided patient with direct contact information for care management team  Patient Self Care Activities:   Verbalizes understanding of the education/instructions given today  Patient is currently UNABLE TO independently provide self-care or self administer medications . Attends all scheduled provider appointments (grandson and family are providing transportation) . Grandson or caregiver Helen Richardson calls pharmacy for medication refills . Needs assistance with ADL's independently . Needs assistance with IADL's independently . Helen Richardson calls provider office for new concerns or questions  Please see past updates related to this goal by clicking on the "Past Updates" button in the selected goal          Follow up with [facility] for patient status update    Delsa Sale, [facility] Care Management Coordinator [facility] Care Management/[facility]  Direct Phone: [phone]

## 2018-07-30 NOTE — Telephone Encounter (Signed)
I called pt at both contact numbers, no answer. Left message that new insulin will be sent to the pharmacy. Helen Richardson would be used INSTEAD of existing insulin. PT advised that insulin should be given between 9-10pm. Unable to leave message on cell phone. Will contact pt again on Monday. Pt will start with 34 units nightly on Tresiba U-200 pen. Will titrate her as needed.

## 2018-07-30 NOTE — Patient Instructions (Addendum)
Visit Information  Goals Addressed    . "Her pain is so bad from her arthritis"   Not on track    Current Barriers:  [name] Kitchen Knowledge Deficits related to pain management for Arthritis  Nurse Case Manager Clinical Goal(s):  [name] Kitchen Over the next 30 days, patient will demonstrate a decrease in Pain exacerbations as evidenced by patient will exhibit increased and improved physical mobility and will be able to particpate in PT  CCM RN CM Interventions:  Completed on 07/28/18: completed call with caregiver Lessie Dings  . Evaluation of current treatment plan related to pain management for arthritis and patient's adherence to plan as established by provider. . Confirmed patient is taking Tramadol as directed for pain; assessed for effectiveness of pain med . Discussed patient continues to have shoulder pain and follow up with an Orthopedic doctor today . Assessed for readmission of in-home PT; per [place] the PT have restarted . Discussed plans with patient for ongoing care management follow up and provided patient with direct contact information for care management team  Patient Self Care Activities:   Patient is currently UNABLE TO independently provide self-care or self administer medications . Attends all scheduled provider appointments (grandson and family are providing transportation) . Grandson or caregiver [place] calls pharmacy for medication refills . Needs assistance with ADL's independently . Needs assistance with IADL's independently . Leanna Battles calls provider office for new concerns or questions  Please see past updates related to this goal by clicking on the "Past Updates" button in the selected goal     . "I think my grandmother has CHF"   Not on track    Grandson stated:   Current Barriers:  [name] Kitchen Knowledge Deficits related to cause for lower leg edema and recent fall   Nurse Case Manager Clinical Goal(s):  [name] Kitchen Over the next 90 days, patient and grandson Katoria Nasso will work  with the CCM team to address care coordination needs and disease specific education needs for health related comorbidies and how to self manage. 07/15/18-Target goal date changed to 90 days  CCM RN CM Interventions:  Completed call with caregiver Lessie Dings on 07/28/18   Assessed for adherence to daily weights -- [place] states the aide caring for Ms. Motyka in the am is not being consistent with obtaining daily weights -- reinforced the importance and rationale for purpose of consistently checking and recording daily weights first thing in the morning around the same time, on the same scale after patient empties her bladder and with a few clothes on as possible -- [place] verbalizes understanding and will reinforce this with the am caregiver  Reinforced verbal education related to other s/s suggestive of CHF, including increased edema to lower extremities, hands or abdomen, increased shortness of breath, cough, nausea/vomiting and reinforced when to report symptoms  Reviewed guidelines for weight gain of 3 lbs in one day and or 5 lbs in one week  Reinforced importance of patient taking her fluid pill exactly as prescribed  Reinforced importance of adhering to a low Sodium diet . Discussed plans with patient for ongoing care management follow up and provided patient with direct contact information for care management team  Patient Self Care Activities:  Wyline Mood and caregiver [place] verbalizes understanding of the education/information provided during today's call   Patient is currently UNABLE TO independently provide self-care or self administer medications . Attends all scheduled provider appointments (grandson and family are providing transportation) . Grandson or caregiver [place] calls  pharmacy for medication refills . Needs assistance with ADL's independently . Needs assistance with IADL's independently . Leanna BattlesGrandson Brandon calls provider office for new concerns or  questions  Please see past updates related to this goal by clicking on the "Past Updates" button in the selected goal     . "I would like a Palliative Care Referral"   On track    Current Barriers:  [name] Kitchen. Knowledge Deficits related to availbility and purpose of Palliative Care  Nurse Case Manager Clinical Goal(s):  [name] Kitchen. Over the next 30 days, patient will verbalize understanding of plan for initiating a Palliative Care Referral for advanced care planning  CCM RN CM Interventions: Completed on 07/28/18: completed call with caregiver Lessie DingsSierra Haith  . Discussed patient's Palliative Care status with caregiver [name] . Discussed Palliative Care will provide services; NP Amy Garner NashDaniels completed initial home visit . Discussed that NP Amy Garner NashDaniels was scheduled to complete next visit today but was missed due to patient was a MD appt  . Instructed caregiver to notify the NP in the future ahead of time to coordinate home visits with any MD appts the patient may have in order to not miss the scheduled home visit -- she verbalizes understanding . Discussed plans with patient for ongoing care management follow up and provided patient with direct contact information for care management team  CCM SW Interventions: Completed 07/23/18  Inbound call from Palliative Care RN navigator Beryle BeamsBetty Senor who reports the patient would have an in person palliative care intake visit with NP Amy on Friday at 4:00 pm  Outbound call to the patients grandson Apolinar JunesBrandon to confirm knowledge of appointment  Planned follow up call within the next 7-10 days   Patient Self Care Activities:   Patient is currently UNABLE TO independently provide self-care or self administer medications . Attends all scheduled provider appointments (grandson and family are providing transportation) . Grandson or caregiver [name] calls pharmacy for medication refills . Needs assistance with ADL's independently . Needs assistance with IADL's  independently . Leanna BattlesGrandson Brandon calls provider office for new concerns or questions  Please see past updates related to this goal by clicking on the "Past Updates" button in the selected goal     . High Risk for readmission    On track    Current Barriers:  [name] Kitchen. Knowledge Deficits related to prevention of UTI and dehyration  Nurse Case Manager Clinical Goal(s):  [name] Kitchen. Over the next 30 days, patient will not experience hospital admission. Hospital Admissions in last 6 months = 3  CCM RN CM Interventions:  Completed on 07/28/18 with caregiver Lessie DingsSierra Haith   Assessed for s/s suggestive of dehydration; encouraged to push fluids, water, broth, jello, popsicles  Assessed for frequency of urination; discussed urine should be clear yellow  Reinforced importance of keeping patient well hydrated to help improve kidney function and improve overall health   Assessed ongoing monitoring of CBG's daily before meals-- [name] has not been caring for Ms. Schuler for the past 5 days, she is unaware of her BG levels during this time  Encouraged [name] to notify RNCM or Pharm D of patient's recorded CBG's as soon as she returns to the patient's home ([name] agreed, states she will return today) . Discussed plans with patient for ongoing care management follow up and provided patient with direct contact information for care management team  Patient Self Care Activities:   Verbalizes understanding of the education/instructions given today  Patient is currently UNABLE TO independently provide self-care  or self administer medications . Attends all scheduled provider appointments (grandson and family are providing transportation) . Grandson or caregiver [place] calls pharmacy for medication refills . Needs assistance with ADL's independently . Needs assistance with IADL's independently . Leanna Battles calls provider office for new concerns or questions  Please see past updates related to this goal by clicking  on the "Past Updates" button in the selected goal        The patient verbalized understanding of instructions provided today and declined a print copy of patient instruction materials.   Follow up with [facility] for patient status update   Delsa Sale, [facility] Care Management Coordinator [facility] Care Management/[facility]  Direct Phone: [phone]

## 2018-07-30 NOTE — Progress Notes (Signed)
  Chronic Care Management   Visit Note  07/30/2018 Name: Helen Richardson MRN: [id number] DOB: June 02, 1933  Referred by: Dorothyann Peng, MD Reason for referral : Chronic Care Management   Helen Richardson is a 85 y.o. year old female who is a primary care patient of Dorothyann Peng, MD. The CCM team was consulted for assistance with chronic disease management and care coordination needs.   Review of patient status, including review of consultants reports, relevant laboratory and other test results, and collaboration with appropriate care team members and the patient's provider was performed as part of comprehensive patient evaluation and provision of chronic care management services.    I spoke with patient's grandson [name]), home health RN Dois Davenport) and caregiver Raoul Pitch) by telephone today.  Objective:   Goals Addressed            This Visit's Progress     Patient Stated   . I don't know if insulin regimen is working (pt-stated)       Current Barriers:  [name] Kitchen Knowledge Deficits related to managing diabetes  Pharmacist Clinical Goal(s):  [name] Kitchen Over the next 30 days, patient will demonstrate Improved medication adherence as evidenced by BGs within range, FBG<130, no hypoglycemia . Over the next 30 days, patient will work with CCM team to address needs related to managing diabetes  CCM RN CM Interventions: Completed on 07/21/18   Completed a CCM RN Telephone Follow Up with caregiver Lessie Dings . [place] advised the patient has a poor appetite and her BS dropped to 66, [place] followed instructions previously discussed with last RNCM call; BG 15 min recheck= 64; caregiver repeated juice and gave 1 hershey kiss chocolate candy; caregiver instructed to do 15 min recheck and hold evening insulin . Discussed plans with patient for ongoing care management follow up and provided patient with direct contact information for care management team . Collaboration with embedded Pharm D Vanice Sarah re:  unstable BGs reported by caregiver   CCM PharmD Interventions: Completed on 07/30/18   Call placed to [place] around 6pm--> BGs in the upper 100s in the evenings.  Patient still inconsistent with eating/diet.  Instructed caregiver to call office or CCM team if concerned about BGs.  Counseled on S/SX hypoglycemia and how to treat.  Spoke with [facility] RN to discuss BGs--she will be going to the home on Tuesdays and report BGs back to PCP & CCM team in order to adjust insulin appropriately.  Appears patient is not controlled on humalog mix.  Would prefer Basal insulin with potential meal time coverage to avoid hypo/hyperglycemic events (tresiba covered on insurance--can also do patient assistance). Will discuss with PCP, Dr. Dorothyann Peng.  Last A1c 10.2 on 5.11.20 (down from 12.1)--will continue to follow and adjust insulin as appropriate  Patient Self Care Activities:  . Currently UNABLE TO independently manage diabetes; chronic conditions  Please see past updates related to this goal by clicking on the "Past Updates" button in the selected goal        Plan:   The care management team will reach out to the patient again over the next 7 days.   [name], PharmD, BCPS Clinical Pharmacist, [facility]  [facility]  Direct Dial: [phone]

## 2018-08-03 ENCOUNTER — Telehealth: Payer: Medicare Other

## 2018-08-03 ENCOUNTER — Other Ambulatory Visit: Payer: Self-pay

## 2018-08-03 ENCOUNTER — Telehealth: Payer: Self-pay

## 2018-08-03 ENCOUNTER — Ambulatory Visit: Payer: Self-pay

## 2018-08-03 DIAGNOSIS — N183 Chronic kidney disease, stage 3 unspecified: Secondary | ICD-10-CM

## 2018-08-03 DIAGNOSIS — I10 Essential (primary) hypertension: Secondary | ICD-10-CM

## 2018-08-03 DIAGNOSIS — E1122 Type 2 diabetes mellitus with diabetic chronic kidney disease: Secondary | ICD-10-CM

## 2018-08-03 DIAGNOSIS — M2550 Pain in unspecified joint: Secondary | ICD-10-CM

## 2018-08-03 NOTE — Telephone Encounter (Signed)
Returned call to [facility] no answer left message to call back. ((starting tresiba))

## 2018-08-04 ENCOUNTER — Ambulatory Visit: Payer: Self-pay | Admitting: Pharmacist

## 2018-08-04 ENCOUNTER — Other Ambulatory Visit: Payer: Self-pay

## 2018-08-04 ENCOUNTER — Telehealth: Payer: Self-pay

## 2018-08-04 ENCOUNTER — Ambulatory Visit (INDEPENDENT_AMBULATORY_CARE_PROVIDER_SITE_OTHER): Payer: Medicare Other

## 2018-08-04 DIAGNOSIS — I10 Essential (primary) hypertension: Secondary | ICD-10-CM

## 2018-08-04 DIAGNOSIS — Z794 Long term (current) use of insulin: Secondary | ICD-10-CM

## 2018-08-04 DIAGNOSIS — N183 Chronic kidney disease, stage 3 unspecified: Secondary | ICD-10-CM

## 2018-08-04 DIAGNOSIS — Z598 Other problems related to housing and economic circumstances: Secondary | ICD-10-CM

## 2018-08-04 DIAGNOSIS — R531 Weakness: Secondary | ICD-10-CM

## 2018-08-04 DIAGNOSIS — Z599 Problem related to housing and economic circumstances, unspecified: Secondary | ICD-10-CM

## 2018-08-04 DIAGNOSIS — E1122 Type 2 diabetes mellitus with diabetic chronic kidney disease: Secondary | ICD-10-CM | POA: Diagnosis not present

## 2018-08-04 NOTE — Patient Instructions (Addendum)
Visit Information  Goals Addressed      Patient Stated   . "We need help with home repairs" (pt-stated)       Grandson stated  Current Barriers:  . Financial constraints . Lacks knowledge of community resource: community housing solutions  Clinical Social Work Clinical Goal(s):  [name] Kitchen Over the next 60 days, client will work with SW to address concerns related to home modifications  CCM SW Interventions: Completed 07/23/18  Outbound call to the patients grandson by CCM SW  Communicated continued difficulty in coordinating with [employer] after multiple e-mail and phone call outreach attempts  Determined the patients family has yet to hear from referral source  Advised the patients family there may be other resources available to assist  Requested information on exact home modifications needed - [name] reports he is not sure and will have to get back to [employer] on why it is important to know exactly what is needed to help identify available resources  Scheduled follow up call with the patients grandson and caregivers in the next 7-10 days  CCM RN CM Interventions:  Completed call with caregiver Lessie Dings on 08/03/18:    Discussed a list of home repairs is needed in order to receive assistance from the [employer]  Discussed that [place] and or [name] will call back to provide a list of repairs needed  Discussed the request may be closed if a list of repairs are not received soon - [place] verbalizes understanding   Provided update to [employer]   Patient Self Care Activities:  . Attends all scheduled provider appointments . Calls pharmacy for medication refills . Calls provider office for new concerns or questions   Please see past updates related to this goal by clicking on the "Past Updates" button in the selected goal      . "We need more help in the home" (pt-stated)       Grandson and son stated:  Current  Barriers:  . Financial constraints . Limited education about community resources and insurance benefits . Inability to perform ADL's independently . Inability to perform IADL's independently  Clinical Social Work Clinical Goal(s):  [name] Kitchen Over the next 45 days, client will work with SW to address concerns related to obtaining a caregiver . Over the next 10 days, client will follow up with DSS to complete a Medicaid application as directed by SW  Interventions:  Completed a CCM RN Telephone Follow Up with grandson Lale Lagrassa and patient; Ms. Lockmiller gave verbal consent to speak with caregiver Lessie Dings  Assessed if the patient had been visited by SW from [facility] to assist with completion of Medicaid application - Janyce Casali and caregiver Raoul Pitch confirm the Medicaid application was completed and mailed on 07/05/18  Discussed that [name] will notify the CCM team once he receives notification with a determination  Collaboration with embedded BSW Bevelyn Ngo re: status update for the Medicaid application  Scheduled a follow up call with grandson [name] and caregiver [place]  CCM RN CM Interventions:  Completed call with caregiver Lessie Dings on 08/03/18:    Assessed for home safety concerns; discussed patient has an emergency alert pendant and is able to use it if needed   Discussed the family does not leave the patient home alone if she is "having a bad day" and is not mobile or able to provide self care  Discussed if the family and or caregiver leaves the  home, the front exterior door is locked but the patient is able to access the interior lock and is able to release the lock if needed to exit the home in case of emergency  Discussed plans with patient for ongoing care management follow up and provided patient with direct contact information for care management team  Patient Self Care Activities:  . Currently UNABLE TO independently perform iADL's and ADL's   . Relies on family members to assist with care needs  Please see past updates related to this goal by clicking on the "Past Updates" button in the selected goal     . I don't know if insulin regimen is working (pt-stated)       Current Barriers:  [name] Kitchen. Knowledge Deficits related to managing diabetes  Pharmacist Clinical Goal(s):  [name] Kitchen. Over the next 90 days, patient will demonstrate Improved medication adherence as evidenced by BGs within range, FBG<130, no hypoglycemia . Over the next 90 days, patient will work with CCM team to address needs related to managing diabetes 08/03/18: re-established target goal date to 90 days due to treatment delays secondary to COVID-19  CCM RN CM Interventions: Completed on 08/03/18 with caregiver Lessie DingsSierra Haith   Discussed new insulin regimen with caregiver Lessie DingsSierra Haith   Reviewed patient should discontinue the Novolog insulin and start Tresiba 35u around 8-9 pm daily starting today  [name] verbalizes understanding and plans to pick-up the [name] today  Discussed importance of all caregivers involved are knowledgeable and are aware of the new insulin - [name] confirms all caregivers are aware  Discussed plans with patient for ongoing care management follow up and provided patient with direct contact information for care management team  CCM PharmD Interventions: Completed on 07/30/18   Call placed to [name] around 6pm--> BGs in the upper 100s in the evenings.  Patient still inconsistent with eating/diet.  Instructed caregiver to call office or CCM team if concerned about BGs.  Counseled on S/SX hypoglycemia and how to treat.  [facility]--she will be going to the home on Tuesdays and report BGs back to PCP & CCM team in order to adjust insulin appropriately.  Appears patient is not controlled on humalog mix.  Would prefer Basal insulin with potential meal time coverage to avoid hypo/hyperglycemic events (tresiba covered on  insurance--can also do patient assistance). Will discuss with PCP, Dr. Dorothyann Pengobyn Sanders.  Last A1c 10.2 on 5.11.20 (down from 12.1)--will continue to follow and adjust insulin as appropriate  Patient Self Care Activities:  . Currently UNABLE TO independently manage diabetes; chronic conditions  Please see past updates related to this goal by clicking on the "Past Updates" button in the selected goal        The patient verbalized understanding of instructions provided today and declined a print copy of patient instruction materials.   Telephone follow up appointment with care management team member scheduled for: 08/11/18  Delsa SaleAngel Ona Roehrs, [facility] LLCRN,CCM Care Management Coordinator [facility] Care [facility]  Direct Phone: [phone]

## 2018-08-04 NOTE — Progress Notes (Signed)
  Chronic Care Management   Visit Note  08/04/2018 Name: Helen Richardson MRN: [id number] DOB: [birth year]  Referred by: Helen Peng, MD Reason for referral : Chronic Care Management   Helen Richardson is a 85 y.o. year old female who is a primary care patient of Helen Peng, MD. The CCM team was consulted for assistance with chronic disease management and care coordination needs.   Review of patient status, including review of consultants reports, relevant laboratory and other test results, and collaboration with appropriate care team members and the patient's provider was performed as part of comprehensive patient evaluation and provision of chronic care management services.    I spoke with caregiver, Helen Richardson and Helen Richardson by telephone today.  Objective:   Goals Addressed            This Visit's Progress     Patient Stated   . My test strips are too expensive (pt-stated)       Current Barriers:  . Financial Barriers-needs RX for test strips  Pharmacist Clinical Goal(s):  [name] Kitchen Over the next 30 days, patient will work with PharmD & PCP to address needs related to test strip affordability  Interventions: . Collaboration with provider re: medication management  . Patient using One Touch Verio meter--CMA assisted with sending new RX to be covered under patient's insurance  Patient Self Care Activities:  With help from caregivers . Self administers medications as prescribed . Attends all scheduled provider appointments . Calls pharmacy for medication refills  Initial goal documentation         Plan:   The care management team will reach out to the patient again over the next 5 days.   [name], PharmD, BCPS Clinical Pharmacist, [facility]  [facility]  Direct Dial: [phone]

## 2018-08-04 NOTE — Patient Instructions (Signed)
Visit Information  Goals Addressed            This Visit's Progress     Patient Stated   . My test strips are too expensive (pt-stated)       Current Barriers:  . Financial Barriers-needs RX for test strips  Pharmacist Clinical Goal(s):  [name] Kitchen Over the next 30 days, patient will work with PharmD & PCP to address needs related to test strip affordability  Interventions: . Collaboration with provider re: medication management  . Patient using One Touch Verio meter--CMA assisted with sending new RX to be covered under patient's insurance  Patient Self Care Activities:  With help from caregivers . Self administers medications as prescribed . Attends all scheduled provider appointments . Calls pharmacy for medication refills  Initial goal documentation        The patient verbalized understanding of instructions provided today and declined a print copy of patient instruction materials.   The care management team will reach out to the patient again over the next 5 days.   [name], PharmD, BCPS Clinical Pharmacist, [facility]  [facility]  Direct Dial: [phone]

## 2018-08-04 NOTE — Chronic Care Management (AMB) (Signed)
Chronic Care Management    Clinical Social Work Follow Up Note  08/04/2018 Name: Helen Richardson MRN: [id number] DOB: 07/25/1933  Helen Richardson is a 85 y.o. year old female who is a primary care patient of Dorothyann Richardson, [name], Helen Richardson. The CCM team was consulted for assistance with [employer].   Review of patient status, including review of consultants reports, other relevant assessments, and collaboration with appropriate care team members and the patient's provider was performed as part of comprehensive patient evaluation and provision of chronic care management services.     Goals Addressed            This Visit's Progress     Patient Stated   . COMPLETED: "We need help with home repairs" (pt-stated)       Grandson stated  Current Barriers:  . Financial constraints . Lacks knowledge of community resource: community housing solutions  Clinical Social Work Clinical Goal(s):  [name] Kitchen. Over the next 60 days, client will work with SW to address concerns related to home modifications  CCM SW Interventions: Completed 08/04/18  Outbound call to the patients grandson by CCM SW  Reviewed status of patient goal   Assessed for Helen Richardson's knowledge of needed home repair - Helen Richardson stated "she doesn't need anything. I remodeled the kitchen myself and bought her a new washer and dryer. I took care of everything"  Discussed patient referral to [employer] and reviewed home modification services provided by this Teacher, adult educationresource including heating repair and wheelchair ramp installations  Determined the patient's home is one story and she does not need a ramp to assist at this time  Apple Computerdvised Helen Richardson CCM SW would close referral to [employer] as home modification needs are no longer identified  Collaboration with Stry[employer] CorporationLynda Hunter Hopkins via e-mail correspondence to close patient referral at this time  Encouraged Helen Richardson to contact CCM SW if home modification resources are needed  at a later time  CCM RN CM Interventions:  Completed call with caregiver Helen Richardson on 08/03/18:    Discussed a list of home repairs is needed in order to receive assistance from the [employer]  Discussed that [name] and or Helen Richardson will call back to provide a list of repairs needed  Discussed the request may be closed if a list of repairs are not received soon - [name] verbalizes understanding   Provided update to [employer] Caira Poche   Patient Self Care Activities:  . Attends all scheduled provider appointments . Calls pharmacy for medication refills . Calls provider office for new concerns or questions   Please see past updates related to this goal by clicking on the "Past Updates" button in the selected goal      . COMPLETED: "We need more help in the home" (pt-stated)       Grandson and son stated:  Current Barriers:  . Financial constraints . Limited education about community resources and insurance benefits . Inability to perform ADL's independently . Inability to perform IADL's independently  Clinical Social Work Clinical Goal(s):  [name] Kitchen. Over the next 45 days, client will work with SW to address concerns related to obtaining a caregiver . Over the next 10 days, client will follow up with DSS to complete a Medicaid application as directed by SW  CCM SW Interventions: Completed 08/04/18  Outbound call to the patients grandson Helen Richardson to assess progression of goal  Determined the patient has been doing better since receiving cortisone injections and is able to participate  with transfers and ambulation  Helen Richardson reports he has also purchased the patient a new lift chair to assist with mobility  Assessed for further SW intervention needed - Helen Richardson reports he is able to care for the patient and does not need assistance at this time  Encouraged Helen Richardson to contact CCM SW if future resources are needed  CCM RN CM Interventions:  Completed call with  caregiver Helen Richardson on 08/03/18:    Assessed for home safety concerns; discussed patient has an emergency alert pendant and is able to use it if needed   Discussed the family does not leave the patient home alone if she is "having a bad day" and is not mobile or able to provide self care  Discussed if the family and or caregiver leaves the home, the front exterior door is locked but the patient is able to access the interior lock and is able to release the lock if needed to exit the home in case of emergency  Discussed plans with patient for ongoing care management follow up and provided patient with direct contact information for care management team  Patient Self Care Activities:  . Currently UNABLE TO independently perform iADL's and ADL's  . Relies on family members to assist with care needs  Please see past updates related to this goal by clicking on the "Past Updates" button in the selected goal      . I don't know if insulin regimen is working (pt-stated)   Not on track    Current Barriers:  [name] Kitchen Knowledge Deficits related to managing diabetes  Pharmacist Clinical Goal(s):  [name] Kitchen Over the next 90 days, patient will demonstrate Improved medication adherence as evidenced by BGs within range, FBG<130, no hypoglycemia . Over the next 90 days, patient will work with CCM team to address needs related to managing diabetes 08/03/18: re-established target goal date to 90 days due to treatment delays secondary to COVID-19  CCM SW Interventions: Completed 08/04/2018  Outbound call to the patients grandson and primary caregiver Helen Richardson  Confirmed the patients insulin had been filled and picked up from the pharmacy  Obtained information that the patient refused to begin new medication "until hearing from Dr. Allyne Gee that she should take it"  Performed chart review and communicated to [facility] that Dr Allyne Gee had attempted to contact the patient on 5/29 and left a voice message regarding  new medication - Helen Richardson reports the patients phone has been acting up and requests a follow up call to the patient between 3-5 pm   Sent message to Dr Allyne Gee via secure chat to provide information regarding patients reluctance to take medication until speaking with her provider. Asked that Dr. Allyne Gee or a member of the medical team contact the patient between 3-5 pm to review medication change  Assessed for other barrier to medication compliance- Helen Richardson reports the patients medication cost $80.00 and he was not aware he could use prescriptions for lancets and glucometer strips for insurance to assist with cost  Advised Helen Richardson that CCM SW would collaborate with the patients medical team to assist with medication assistance as well as prescription coverage of lancets and testing strips  Collaboration with CCM RN Case Manager to provide an update on medication compliance  Collaboration with CCM Pharm D requesting assistance with medication costs as well as prescription coverage of testing supplies  CCM RN CM Interventions: Completed on 08/03/18 with caregiver Helen Richardson   Discussed new insulin regimen with caregiver Helen Richardson   Reviewed  patient should discontinue the Novolog insulin and start Tresiba 35u around 8-9 pm daily starting today  Helen Richardson verbalizes understanding and plans to pick-up the [place] today  Discussed importance of all caregivers involved are knowledgeable and are aware of the new insulin - Helen Richardson confirms all caregivers are aware  Discussed plans with patient for ongoing care management follow up and provided patient with direct contact information for care management team  CCM PharmD Interventions: Completed on 07/30/18   Call placed to Helen Richardson around 6pm--> BGs in the upper 100s in the evenings.  Patient still inconsistent with eating/diet.  Instructed caregiver to call office or CCM team if concerned about BGs.  Counseled on S/SX hypoglycemia and how to  treat.  Spoke with [facility] RN to discuss BGs--she will be going to the home on Tuesdays and report BGs back to PCP & CCM team in order to adjust insulin appropriately.  Appears patient is not controlled on humalog mix.  Would prefer Basal insulin with potential meal time coverage to avoid hypo/hyperglycemic events (tresiba covered on insurance--can also do patient assistance). Will discuss with PCP, Dr. Dorothyann Peng.  Last A1c 10.2 on 5.11.20 (down from 12.1)--will continue to follow and adjust insulin as appropriate  Patient Self Care Activities:  . Currently UNABLE TO independently manage diabetes; chronic conditions  Please see past updates related to this goal by clicking on the "Past Updates" button in the selected goal          Follow Up Plan: No further CCM SW follow planned at this time   Bevelyn Ngo, BSW, CDP Social Worker, Certified Dementia Practitioner [facility] Care Management [phone]  Total time spent performing care coordination and/or care management activities with the patient by phone or face to face = 23 minutes.

## 2018-08-04 NOTE — Patient Instructions (Signed)
Social Worker Visit Information  Goals we discussed today:  Goals Addressed            This Visit's Progress     Patient Stated   . COMPLETED: "We need help with home repairs" (pt-stated)       Helen Richardson stated  Current Barriers:  . Financial constraints . Lacks knowledge of community resource: community housing solutions  Clinical Social Work Clinical Goal(s):  Helen Richardson Kitchen Over the next 60 days, client will work with SW to address concerns related to home modifications  CCM SW Interventions: Completed 08/04/18  Outbound call to the patients Helen Richardson by CCM SW  Reviewed status of patient goal   Assessed for Helen Richardson's knowledge of needed home repair - Helen Richardson stated "she doesn't need anything. I remodeled the kitchen myself and bought her a new washer and dryer. I took care of everything"  Discussed patient referral to [employer] and reviewed home modification services provided by this Teacher, adult education and wheelchair ramp installations  Determined the patient's home is one story and she does not need a ramp to assist at this time  [employer] CCM SW would close referral to [employer] as home modification needs are no longer identified  Collaboration with [employer] via e-mail correspondence to close patient referral at this time  Encouraged Helen Richardson to contact CCM SW if home modification resources are needed at a later time   Patient Self Care Activities:  . Attends all scheduled provider appointments . Calls pharmacy for medication refills . Calls provider office for new concerns or questions   Please see past updates related to this goal by clicking on the "Past Updates" button in the selected goal      . COMPLETED: "We need more help in the home" (pt-stated)       Helen Richardson and son stated:  Current Barriers:  . Financial constraints . Limited education about community resources and insurance  benefits . Inability to perform ADL's independently . Inability to perform IADL's independently  Clinical Social Work Clinical Goal(s):  Helen Richardson Kitchen Over the next 45 days, client will work with SW to address concerns related to obtaining a caregiver . Over the next 10 days, client will follow up with DSS to complete a Medicaid application as directed by SW  CCM SW Interventions: Completed 08/04/18  Outbound call to the patients Helen Richardson Helen Richardson to assess progression of goal  Determined the patient has been doing better since receiving cortisone injections and is able to participate with transfers and ambulation  Helen Richardson reports he has also purchased the patient a new lift chair to assist with mobility  Assessed for further SW intervention needed - Helen Richardson reports he is able to care for the patient and does not need assistance at this time  Encouraged Helen Richardson to contact CCM SW if future resources are needed   Patient Self Care Activities:  . Currently UNABLE TO independently perform iADL's and ADL's  . Relies on family members to assist with care needs  Please see past updates related to this goal by clicking on the "Past Updates" button in the selected goal      . I don't know if insulin regimen is working (pt-stated)   Not on track    Current Barriers:  Helen Richardson Kitchen Knowledge Deficits related to managing diabetes  Pharmacist Clinical Goal(s):  Helen Richardson Kitchen Over the next 90 days, patient will demonstrate Improved medication adherence as evidenced by BGs within range, FBG<130, no hypoglycemia . Over  the next 90 days, patient will work with CCM team to address needs related to managing diabetes 08/03/18: re-established target goal date to 90 days due to treatment delays secondary to COVID-19  CCM SW Interventions: Completed 08/04/2018  Outbound call to the patients Helen Richardson and primary caregiver Helen Richardson  Confirmed the patients insulin had been filled and picked up from the pharmacy  Obtained  information that the patient refused to begin new medication "until hearing from Dr. Allyne Richardson that she should take it"  Performed chart review and [facility] Helen Richardson had attempted to contact the patient on 5/29 and left a voice message regarding new medication - Helen JunesBrandon reports the patients phone has been acting up and requests a follow up call to the patient between 3-5 pm   Sent message to Dr Helen Richardson via secure chat to provide information regarding patients reluctance to take medication until speaking with her provider. Asked that Dr. Allyne Richardson or a member of the medical team contact the patient between 3-5 pm to review medication change  Assessed for other barrier to medication compliance- Helen JunesBrandon reports the patients medication cost $80.00 and he was not aware he could use prescriptions for lancets and glucometer strips for insurance to assist with cost  Advised Helen JunesBrandon that CCM SW would collaborate with the patients medical team to assist with medication assistance as well as prescription coverage of lancets and testing strips  Collaboration with CCM RN Case Manager to provide an update on medication compliance  Collaboration with CCM Pharm D requesting assistance with medication costs as well as prescription coverage of testing supplies   Patient Self Care Activities:  . Currently UNABLE TO independently manage diabetes; chronic conditions  Please see past updates related to this goal by clicking on the "Past Updates" button in the selected goal          Materials Provided: No: Patient declined  Follow Up Plan: NO further SW follow up planned at this time. Please call if resources are needed.   Helen Richardson, BSW, CDP Social Worker, Certified Dementia Practitioner [facility] Care Management [phone]

## 2018-08-04 NOTE — Patient Instructions (Signed)
Visit Information  Goals Addressed            This Visit's Progress     Patient Stated   . I don't know if insulin regimen is working (pt-stated)       Current Barriers:  [name] Kitchen Knowledge Deficits related to managing diabetes  Pharmacist Clinical Goal(s):  [name] Kitchen Over the next 30 days, patient will demonstrate Improved medication adherence as evidenced by BGs within range, FBG<130, no hypoglycemia . Over the next 30 days, patient will work with CCM team to address needs related to managing diabetes  CCM RN CM Interventions: Completed on 07/21/18   Completed a CCM RN Telephone Follow Up with caregiver Lessie Dings . [place] advised the patient has a poor appetite and her BS dropped to 55, [place] followed instructions previously discussed with last RNCM call; BG 15 min recheck= 64; caregiver repeated juice and gave 1 hershey kiss chocolate candy; caregiver instructed to do 15 min recheck and hold evening insulin . Discussed plans with patient for ongoing care management follow up and provided patient with direct contact information for care management team . Collaboration with embedded Pharm D Vanice Sarah re: unstable BGs reported by caregiver   CCM PharmD Interventions: Completed on 07/30/18   Call placed to [place] around 6pm--> BGs in the upper 100s in the evenings.  Patient still inconsistent with eating/diet.  Instructed caregiver to call office or CCM team if concerned about BGs.  Counseled on S/SX hypoglycemia and how to treat.  Spoke with [facility] RN to discuss BGs--she will be going to the home on Tuesdays and report BGs back to PCP & CCM team in order to adjust insulin appropriately.  Appears patient is not controlled on humalog mix.  Would prefer Basal insulin with potential meal time coverage to avoid hypo/hyperglycemic events (tresiba covered on insurance--can also do patient assistance). Will discuss with PCP, Dr. Dorothyann Peng.  Last A1c 10.2 on 5.11.20 (down from 12.1)--will  continue to follow and adjust insulin as appropriate  Patient Self Care Activities:  . Currently UNABLE TO independently manage diabetes; chronic conditions  Please see past updates related to this goal by clicking on the "Past Updates" button in the selected goal         The patient verbalized understanding of instructions provided today and declined a print copy of patient instruction materials.   The care management team will reach out to the patient again over the next 7 days.   [name], PharmD, BCPS Clinical Pharmacist, [facility]  [facility]  Direct Dial: [phone]

## 2018-08-04 NOTE — Chronic Care Management (AMB) (Signed)
Chronic Care Management   Follow Up Note   08/03/2018 Name: Helen Richardson MRN: [id number] DOB: 12/25/1933  Referred by: Dorothyann Peng, MD Reason for referral : Chronic Care Management (CCM RNCM Telephone Follow Up )   Helen Richardson is a 85 y.o. year old female who is a primary care patient of Dorothyann Peng, MD. The CCM team was consulted for assistance with chronic disease management and care coordination needs.    Review of patient status, including review of consultants reports, relevant laboratory and other test results, and collaboration with appropriate care team members and the patient's provider was performed as part of comprehensive patient evaluation and provision of chronic care management services.   I spoke with Helen Richardson caregiver for [name] by telephone today.    Goals Addressed      Patient Stated   . "We need help with home repairs" (pt-stated)       Grandson stated  Current Barriers:  . Financial constraints . Lacks knowledge of community resource: community housing solutions  Clinical Social Work Clinical Goal(s):  [name] Kitchen Over the next 60 days, client will work with SW to address concerns related to home modifications  CCM SW Interventions: Completed 07/23/18  Outbound call to the patients grandson by CCM SW  Communicated continued difficulty in coordinating with [employer] after multiple e-mail and phone call outreach attempts  Determined the patients family has yet to hear from referral source  Advised the patients family there may be other resources available to assist  Requested information on exact home modifications needed - Helen Richardson reports he is not sure and will have to get back to [employer] on why it is important to know exactly what is needed to help identify available resources  Scheduled follow up call with the patients grandson and caregivers in the next 7-10 days  CCM RN CM Interventions:  Completed call  with caregiver Helen Richardson on 08/03/18:    Discussed a list of home repairs is needed in order to receive assistance from the [employer]  Discussed that Helen Richardson and or Helen Richardson will call back to provide a list of repairs needed  Discussed the request may be closed if a list of repairs are not received soon - Helen Richardson verbalizes understanding   Provided update to [employer]   Patient Self Care Activities:  . Attends all scheduled provider appointments . Calls pharmacy for medication refills . Calls provider office for new concerns or questions   Please see past updates related to this goal by clicking on the "Past Updates" button in the selected goal      . "We need more help in the home" (pt-stated)       Grandson and son stated:  Current Barriers:  . Financial constraints . Limited education about community resources and insurance benefits . Inability to perform ADL's independently . Inability to perform IADL's independently  Clinical Social Work Clinical Goal(s):  [name] Kitchen Over the next 45 days, client will work with SW to address concerns related to obtaining a caregiver . Over the next 10 days, client will follow up with DSS to complete a Medicaid application as directed by SW  Interventions:  Completed a CCM RN Telephone Follow Up with grandson Helen Richardson and patient; [name] gave verbal consent to speak with caregiver Helen Richardson  Assessed if the patient had been visited by SW from [facility] to assist with completion of Medicaid application - Helen Richardson  Richardson and caregiver Helen Richardson confirm the Medicaid application was completed and mailed on 07/05/18  Discussed that Helen Richardson will notify the CCM team once he receives notification with a determination  Collaboration with embedded BSW Helen Richardson re: status update for the Medicaid application  Scheduled a follow up call with grandson Helen Richardson and caregiver Helen Richardson  CCM RN CM Interventions:   Completed call with caregiver Helen Richardson on 08/03/18:    Assessed for home safety concerns; discussed patient has an emergency alert pendant and is able to use it if needed   Discussed the family does not leave the patient home alone if she is "having a bad day" and is not mobile or able to provide self care  Discussed if the family and or caregiver leaves the home, the front exterior door is locked but the patient is able to access the interior lock and is able to release the lock if needed to exit the home in case of emergency  Discussed plans with patient for ongoing care management follow up and provided patient with direct contact information for care management team  Patient Self Care Activities:  . Currently UNABLE TO independently perform iADL's and ADL's  . Relies on family members to assist with care needs  Please see past updates related to this goal by clicking on the "Past Updates" button in the selected goal      . I don't know if insulin regimen is working (pt-stated)       Current Barriers:  [name] Kitchen Knowledge Deficits related to managing diabetes  Pharmacist Clinical Goal(s):  [name] Kitchen Over the next 90 days, patient will demonstrate Improved medication adherence as evidenced by BGs within range, FBG<130, no hypoglycemia . Over the next 90 days, patient will work with CCM team to address needs related to managing diabetes 08/03/18: re-established target goal date to 90 days due to treatment delays secondary to COVID-19  CCM RN CM Interventions: Completed on 08/03/18 with caregiver Helen Richardson   Discussed new insulin regimen with caregiver Helen Richardson   Reviewed patient should discontinue the Novolog insulin and start Tresiba 35u around 8-9 pm daily starting today  Helen Richardson verbalizes understanding and plans to pick-up the [place] today  Discussed importance of all caregivers involved are knowledgeable and are aware of the new insulin - Helen Richardson confirms all caregivers are  aware  Discussed plans with patient for ongoing care management follow up and provided patient with direct contact information for care management team  CCM PharmD Interventions: Completed on 07/30/18   Call placed to Helen Richardson around 6pm--> BGs in the upper 100s in the evenings.  Patient still inconsistent with eating/diet.  Instructed caregiver to call office or CCM team if concerned about BGs.  Counseled on S/SX hypoglycemia and how to treat.  Spoke with [facility] RN to discuss BGs--she will be going to the home on Tuesdays and report BGs back to PCP & CCM team in order to adjust insulin appropriately.  Appears patient is not controlled on humalog mix.  Would prefer Basal insulin with potential meal time coverage to avoid hypo/hyperglycemic events (tresiba covered on insurance--can also do patient assistance). Will discuss with PCP, Dr. Dorothyann Peng.  Last A1c 10.2 on 5.11.20 (down from 12.1)--will continue to follow and adjust insulin as appropriate  Patient Self Care Activities:  . Currently UNABLE TO independently manage diabetes; chronic conditions  Please see past updates related to this goal by clicking on the "Past Updates" button in the selected goal  Telephone follow up appointment with care management team member scheduled for: 08/11/18   Delsa SaleAngel Emara Lichter, [facility] SurgeryRN,CCM Care Management Coordinator [facility] Care Management/[facility]  Direct Phone: [phone]

## 2018-08-04 NOTE — Telephone Encounter (Signed)
Error

## 2018-08-06 ENCOUNTER — Ambulatory Visit: Payer: Self-pay

## 2018-08-06 ENCOUNTER — Ambulatory Visit: Payer: Medicare Other | Admitting: Pharmacist

## 2018-08-06 DIAGNOSIS — I10 Essential (primary) hypertension: Secondary | ICD-10-CM

## 2018-08-06 DIAGNOSIS — M2550 Pain in unspecified joint: Secondary | ICD-10-CM

## 2018-08-06 DIAGNOSIS — E1122 Type 2 diabetes mellitus with diabetic chronic kidney disease: Secondary | ICD-10-CM

## 2018-08-06 DIAGNOSIS — N183 Chronic kidney disease, stage 3 unspecified: Secondary | ICD-10-CM

## 2018-08-06 DIAGNOSIS — Z794 Long term (current) use of insulin: Secondary | ICD-10-CM

## 2018-08-06 NOTE — Chronic Care Management (AMB) (Signed)
  Chronic Care Management   Outreach Note  08/06/2018 Name: Helen Richardson MRN: [id number] DOB: Aug 05, 1933  Referred by: Dorothyann Peng, MD Reason for referral : Chronic Care Management (CCM RNCM Telephone Follow Up )  Internal collaboration with embedded PharmD Vanice Sarah regarding patient's reported elevated BG in the 500's last evening per a voice message she received from the patient's grandson Oluwafunmilayo Whipkey this morning. After Julie's follow up with Mr. Knoedler this morning, he reports patient's BG came down to 360. Raynelle Fanning provided him with further instruction and will have ongoing follow up with the patient and [name].   I attempted an unsuccessful telephone outreach to Mr. Juliamarie Plachy today. Unfortunately, Mr. Golec did not answer the call and his voicemail box has not been set up.   Follow Up Plan: The care management team will reach out to the patient again over the next 5-7 days.     Delsa Sale, RN,CCM Care Management Coordinator [facility] Care Management/[facility]  Direct Phone: [phone]

## 2018-08-10 ENCOUNTER — Telehealth: Payer: Medicare Other

## 2018-08-11 ENCOUNTER — Other Ambulatory Visit: Payer: Self-pay | Admitting: Internal Medicine

## 2018-08-11 ENCOUNTER — Telehealth: Payer: Medicare Other

## 2018-08-13 ENCOUNTER — Ambulatory Visit: Payer: Self-pay

## 2018-08-13 DIAGNOSIS — E1122 Type 2 diabetes mellitus with diabetic chronic kidney disease: Secondary | ICD-10-CM | POA: Diagnosis not present

## 2018-08-13 DIAGNOSIS — M2550 Pain in unspecified joint: Secondary | ICD-10-CM

## 2018-08-13 DIAGNOSIS — N183 Chronic kidney disease, stage 3 unspecified: Secondary | ICD-10-CM

## 2018-08-13 DIAGNOSIS — I10 Essential (primary) hypertension: Secondary | ICD-10-CM | POA: Diagnosis not present

## 2018-08-13 DIAGNOSIS — Z794 Long term (current) use of insulin: Secondary | ICD-10-CM | POA: Diagnosis not present

## 2018-08-13 NOTE — Patient Instructions (Signed)
Visit Information  Goals Addressed    . "Her pain is so bad from her arthritis"       Current Barriers:  [name] Kitchen Knowledge Deficits related to pain management for Arthritis  Nurse Case Manager Clinical Goal(s):  [name] Kitchen Over the next 60 days, patient will demonstrate a decrease in Pain exacerbations as evidenced by patient will exhibit increased and improved physical mobility and will be able to particpate in PT 08/13/18: Target goal date re-established to 60 days due to services were put on hold but due to resume on 08/16/18  CCM RN CM Interventions:  Completed on 08/13/18: completed call with caregiver Ivory Broad  . Evaluation of current treatment plan related to pain management for arthritis and patient's adherence to plan as established by provider. . Confirmed patient is taking Tramadol as directed for pain; assessed for effectiveness of pain med - caregiver reports Ms. Nadal is having less pain and is more mobile . Confirmed patient completed her MD visit with Ortho - caregiver reports the visit was completed and Ms. Lubrano was given a Cortisone injection which has also improved her pain . Assessed for readmission of in-home PT; per [place] PT has not resumed . Placed outbound call to [facility], spoke with [place] regarding restart of PT/OT - [place] advised orders to resume therapy are needed by Dr. Baird Cancer . Sent in-basket message to Dr. Baird Cancer requesting orders be sent to [facility] to resume PT and OT -requested caregiver Ivory Broad be listed as the point of contact to schedule all PT/OT appointments - caregiver Ivory Broad notified of plan regarding PT/OT . Discussed plans with patient for ongoing care management follow up and provided patient with direct contact information for care management team   Patient Self Care Activities:   Patient is currently UNABLE TO independently provide self-care or self administer medications . Attends all scheduled provider appointments  (grandson and family are providing transportation) . Grandson or caregiver [place] calls pharmacy for medication refills . Needs assistance with ADL's independently . Needs assistance with IADL's independently . [name] calls provider office for new concerns or questions  Please see past updates related to this goal by clicking on the "Past Updates" button in the selected goal     . COMPLETED: "I would like a Palliative Care Referral"       Current Barriers:  [name] Kitchen Knowledge Deficits related to availbility and purpose of Palliative Care  Nurse Case Manager Clinical Goal(s):  [name] Kitchen Over the next 30 days, patient will verbalize understanding of plan for initiating a Palliative Care Referral for advanced care planning  Goal Met   CCM RN CM Interventions: Completed on 08/13/18: completed call with caregiver Ivory Broad  . Discussed patient's Palliative Care status with caregiver [place] are in place and ongoing  CCM SW Interventions: Completed 07/23/18  Inbound call from [place] who reports the patient would have an in person palliative care intake visit with NP Amy on Friday at 4:00 pm  Outbound call to the patients grandson Erlene Quan to confirm knowledge of appointment  Planned follow up call within the next 7-10 days   Patient Self Care Activities:   Patient is currently UNABLE TO independently provide self-care or self administer medications . Attends all scheduled provider appointments (grandson and family are providing transportation) . Grandson or caregiver [place] calls pharmacy for medication refills . Needs assistance with ADL's independently . Needs assistance with IADL's independently . Grandson  Brandon calls provider office for new concerns or questions  Please see past updates related to this goal by clicking on the "Past Updates" button in the selected goal     . COMPLETED: High Risk for readmission         Current Barriers:  [name] Kitchen Knowledge Deficits related to prevention of UTI and dehyration  Nurse Case Manager Clinical Goal(s):  [name] Kitchen Over the next 30 days, patient will not experience hospital admission. Hospital Admissions in last 6 months = 3 Goal Met   CCM RN CM Interventions:  Completed on 08/13/18; call completed with caregiver Ivory Broad   . Assessed for ED visits and or IP admissions since last hospitalization in April - caregiver denies patient having any hospital events since her last visit in April . Assessed for adequate hydration and caregiver understanding of importance of keeping patient well hydrated - caregiver reports she is drinking plenty fluids . Assessed for improvement in appetite - per caregiver, patient is "eating about the same" . Assessed for recent falls or near falls - caregiver denies . Reviewed s/s of dehydration ; discussed when to call the CCM team and or provider if symptoms suggestive of dehydration occur - caregiver verbalizes understanding   Patient Self Care Activities:   Verbalizes understanding of the education/instructions given today  Patient is currently UNABLE TO independently provide self-care or self administer medications . Attends all scheduled provider appointments (grandson and family are providing transportation) . Grandson or caregiver [place] calls pharmacy for medication refills . Needs assistance with ADL's independently . Needs assistance with IADL's independently . [name] calls provider office for new concerns or questions  Please see past updates related to this goal by clicking on the "Past Updates" button in the selected goal        The patient verbalized understanding of instructions provided today and declined a print copy of patient instruction materials.   The care management team will reach out to the patient again over the next 7-14 days.   Barb Merino, RN,CCM Care Management Coordinator [place]  Management/[facility]  Direct Phone: [phone]

## 2018-08-13 NOTE — Chronic Care Management (AMB) (Signed)
Chronic Care Management   Follow Up Note   08/13/2018 Name: LAURIAN EDRINGTON MRN: [id number] DOB: October 12, 1933  Referred by: Glendale Chard, MD Reason for referral : Chronic Care Management (CCM RNCM Telephone Follow Up )   Helen Richardson is a 85 y.o. year old female who is a primary care patient of Glendale Chard, MD. The CCM team was consulted for assistance with chronic disease management and care coordination needs.    Review of patient status, including review of consultants reports, relevant laboratory and other test results, and collaboration with appropriate care team members and the patient's provider was performed as part of comprehensive patient evaluation and provision of chronic care management services.    I spoke with caregiver Ivory Broad by telephone today for a patient status update.   Goals Addressed    . "Her pain is so bad from her arthritis"       Current Barriers:  [name] Kitchen Knowledge Deficits related to pain management for Arthritis  Nurse Case Manager Clinical Goal(s):  [name] Kitchen Over the next 60 days, patient will demonstrate a decrease in Pain exacerbations as evidenced by patient will exhibit increased and improved physical mobility and will be able to particpate in PT 08/13/18: Target goal date re-established to 60 days due to services were put on hold but due to resume on 08/16/18  CCM RN CM Interventions:  Completed on 08/13/18: completed call with caregiver Ivory Broad  . Evaluation of current treatment plan related to pain management for arthritis and patient's adherence to plan as established by provider. . Confirmed patient is taking Tramadol as directed for pain; assessed for effectiveness of pain med - caregiver reports Ms. Kirchner is having less pain and is more mobile . Confirmed patient completed her MD visit with Ortho - caregiver reports the visit was completed and Ms. Racca was given a Cortisone injection which has also improved her pain . Assessed for  readmission of in-home PT; per [place] PT has not resumed . Placed outbound call to [facility], spoke with [place] regarding restart of PT/OT - [place] advised orders to resume therapy are needed by Dr. Baird Cancer . Sent in-basket message to Dr. Baird Cancer requesting orders be sent to [facility] to resume PT and OT -requested caregiver Ivory Broad be listed as the point of contact to schedule all PT/OT appointments - caregiver Ivory Broad notified of plan regarding PT/OT . Discussed plans with patient for ongoing care management follow up and provided patient with direct contact information for care management team  Patient Self Care Activities:   Patient is currently UNABLE TO independently provide self-care or self administer medications . Attends all scheduled provider appointments (grandson and family are providing transportation) . Grandson or caregiver [place] calls pharmacy for medication refills . Needs assistance with ADL's independently . Needs assistance with IADL's independently . [name] calls provider office for new concerns or questions  Please see past updates related to this goal by clicking on the "Past Updates" button in the selected goal     . COMPLETED: "I would like a Palliative Care Referral"       Current Barriers:  [name] Kitchen Knowledge Deficits related to availbility and purpose of Palliative Care  Nurse Case Manager Clinical Goal(s):  [name] Kitchen Over the next 30 days, patient will verbalize understanding of plan for initiating a Palliative Care Referral for advanced care planning  Goal Met   CCM RN CM Interventions: Completed on 08/13/18: completed call with caregiver Ivory Broad  .  Discussed patient's Palliative Care status with caregiver [place] are in place and ongoing  CCM SW Interventions: Completed 07/23/18  Inbound call from [place] who reports the patient would have an in person palliative care  intake visit with NP Amy on Friday at 4:00 pm  Outbound call to the patients grandson Erlene Quan to confirm knowledge of appointment  Planned follow up call within the next 7-10 days   Patient Self Care Activities:   Patient is currently UNABLE TO independently provide self-care or self administer medications . Attends all scheduled provider appointments (grandson and family are providing transportation) . Grandson or caregiver [place] calls pharmacy for medication refills . Needs assistance with ADL's independently . Needs assistance with IADL's independently . [name] calls provider office for new concerns or questions  Please see past updates related to this goal by clicking on the "Past Updates" button in the selected goal     . COMPLETED: High Risk for readmission        Current Barriers:  [name] Kitchen Knowledge Deficits related to prevention of UTI and dehyration  Nurse Case Manager Clinical Goal(s):  [name] Kitchen Over the next 30 days, patient will not experience hospital admission. Hospital Admissions in last 6 months = 3 Goal Met   CCM RN CM Interventions:  Completed on 08/13/18; call completed with caregiver Ivory Broad   . Assessed for ED visits and or IP admissions since last hospitalization in April - caregiver denies patient having any hospital events since her last visit in April . Assessed for adequate hydration and caregiver understanding of importance of keeping patient well hydrated - caregiver reports she is drinking plenty fluids . Assessed for improvement in appetite - per caregiver, patient is "eating about the same" . Assessed for recent falls or near falls - caregiver denies . Reviewed s/s of dehydration ; discussed when to call the CCM team and or provider if symptoms suggestive of dehydration occur - caregiver verbalizes understanding   Patient Self Care Activities:   Verbalizes understanding of the education/instructions given today  Patient is currently UNABLE TO  independently provide self-care or self administer medications . Attends all scheduled provider appointments (grandson and family are providing transportation) . Grandson or caregiver [place] calls pharmacy for medication refills . Needs assistance with ADL's independently . Needs assistance with IADL's independently . [name] calls provider office for new concerns or questions  Please see past updates related to this goal by clicking on the "Past Updates" button in the selected goal         The care management team will reach out to the patient again over the next 7-14 days.    Barb Merino, RN,CCM Care Management Coordinator [place] [facility]  Direct Phone: [phone]

## 2018-08-16 ENCOUNTER — Other Ambulatory Visit: Payer: Self-pay

## 2018-08-16 ENCOUNTER — Ambulatory Visit (HOSPITAL_COMMUNITY)
Admission: RE | Admit: 2018-08-16 | Discharge: 2018-08-16 | Disposition: A | Discharge status: Home / Self Care | Payer: Medicare Other | Source: Ambulatory Visit | Attending: Internal Medicine | Admitting: Internal Medicine

## 2018-08-16 DIAGNOSIS — R0600 Dyspnea, unspecified: Secondary | ICD-10-CM

## 2018-08-16 DIAGNOSIS — I110 Hypertensive heart disease with heart failure: Secondary | ICD-10-CM | POA: Diagnosis not present

## 2018-08-16 DIAGNOSIS — I083 Combined rheumatic disorders of mitral, aortic and tricuspid valves: Secondary | ICD-10-CM | POA: Insufficient documentation

## 2018-08-16 DIAGNOSIS — R0609 Other forms of dyspnea: Secondary | ICD-10-CM | POA: Diagnosis present

## 2018-08-16 DIAGNOSIS — I509 Heart failure, unspecified: Secondary | ICD-10-CM | POA: Insufficient documentation

## 2018-08-16 DIAGNOSIS — E785 Hyperlipidemia, unspecified: Secondary | ICD-10-CM | POA: Diagnosis not present

## 2018-08-16 DIAGNOSIS — E119 Type 2 diabetes mellitus without complications: Secondary | ICD-10-CM | POA: Diagnosis not present

## 2018-08-16 NOTE — Progress Notes (Signed)
*  PRELIMINARY RESULTS* Echocardiogram 2D Echocardiogram has been performed by [facility] [employer].  Bobbye Charleston 08/16/2018, 2:44 PM

## 2018-08-18 NOTE — Progress Notes (Signed)
Chronic Care Management   Visit Note  08/06/2018 Name: Helen Richardson MRN: [id number] DOB: 09/17/1933  Referred by: [name], [name], MD Reason for referral : Chronic Care Management   Helen Richardson is a 85 y.o. year old female who is a primary care patient of [name], [name], MD. The CCM team was consulted for assistance with chronic disease management and care coordination needs.   Review of patient status, including review of consultants reports, relevant laboratory and other test results, and collaboration with appropriate care team members and the patient's provider was performed as part of comprehensive patient evaluation and provision of chronic care management services.    I spoke with grandson and caregivers by telephone today.  Objective:   Goals Addressed            This Visit's Progress     Patient Stated   . I don't know if insulin regimen is working (pt-stated)       Current Barriers:  [name] Kitchen. Knowledge Deficits related to managing diabetes  Pharmacist Clinical Goal(s):  [name] Kitchen. Over the next 90 days, patient will demonstrate Improved medication adherence as evidenced by BGs within range, FBG<130, no hypoglycemia . Over the next 90 days, patient will work with CCM team to address needs related to managing diabetes 08/03/18: re-established target goal date to 90 days due to treatment delays secondary to COVID-19  CCM SW Interventions: Completed 08/04/2018  Outbound call to the patients grandson and primary caregiver Helen Richardson  Confirmed the patients insulin had been filled and picked up from the pharmacy  Obtained information that the patient refused to begin new medication "until hearing from [name] that she should take it"  Performed chart review and [facility]lyne GeeSanders had attempted to contact the patient on 5/29 and left a voice message regarding new medication - Helen Richardson reports the patients phone has been acting up and requests a follow up  call to the patient between 3-5 pm   Sent message to Dr Allyne GeeSanders via secure chat to provide information regarding patients reluctance to take medication until speaking with her provider. Asked that [name] or a member of the medical team contact the patient between 3-5 pm to review medication change  Assessed for other barrier to medication compliance- Helen Richardson reports the patients medication cost $80.00 and he was not aware he could use prescriptions for lancets and glucometer strips for insurance to assist with cost  Advised Helen Richardson that CCM SW would collaborate with the patients medical team to assist with medication assistance as well as prescription coverage of lancets and testing strips  Collaboration with CCM RN Case Manager to provide an update on medication compliance  Collaboration with CCM Pharm D requesting assistance with medication costs as well as prescription coverage of testing supplies  CCM RN CM Interventions: Completed on 08/03/18 with caregiver Helen Richardson   Discussed new insulin regimen with caregiver Helen Richardson   Reviewed patient should discontinue the Novolog insulin and start Tresiba 35u around 8-9 pm daily starting today  Helen Richardson verbalizes understanding and plans to pick-up the [name] today  Discussed importance of all caregivers involved are knowledgeable and are aware of the new insulin - Helen Richardson confirms all caregivers are aware  Discussed plans with patient for ongoing care management follow up and provided patient with direct contact information for care management team  CCM PharmD Interventions: Completed on 05//20   Call placed to Helen Richardson around 6pm--> BGs in the upper 100s in the evenings.  Patient still inconsistent with eating/diet.  Instructed caregiver to call office or CCM team if concerned about BGs.  Counseled on S/SX hypoglycemia and how to treat.  Spoke with [facility] RN to discuss BGs--she will be going to the home on Tuesdays and report  BGs back to PCP & CCM team in order to adjust insulin appropriately.  Patient has successfully transitioned to once daily [place] and her dose has been titrated to 38 units.  Grandson Helen Richardson) reports blood glucose <200 at this time.  I requested patient's grandson to have family check blood sugars throughout the day around meal times and continue checking FBG.  Case discussed with PCP, Dr. Glendale Chard.  Last A1c 10.2 on 5.11.20 (down from 12.1)--will continue to follow and adjust insulin as appropriate  Patient Self Care Activities:  . Currently UNABLE TO independently manage diabetes; chronic conditions  Please see past updates related to this goal by clicking on the "Past Updates" button in the selected goal         Plan:   The care management team will reach out to the patient again over the next 14 days.   Regina Eck, PharmD, BCPS Clinical Pharmacist, [facility] [place]: [phone]

## 2018-08-18 NOTE — Patient Instructions (Signed)
Visit Information  Goals Addressed            This Visit's Progress     Patient Stated   . I don't know if insulin regimen is working (pt-stated)       Current Barriers:  [name] Kitchen. Knowledge Deficits related to managing diabetes  Pharmacist Clinical Goal(s):  [name] Kitchen. Over the next 90 days, patient will demonstrate Improved medication adherence as evidenced by BGs within range, FBG<130, no hypoglycemia . Over the next 90 days, patient will work with CCM team to address needs related to managing diabetes 08/03/18: re-established target goal date to 90 days due to treatment delays secondary to COVID-19  CCM SW Interventions: Completed 08/04/2018  Outbound call to the patients grandson and primary caregiver Katherine RoanBrandon Robards  Confirmed the patients insulin had been filled and picked up from the pharmacy  Obtained information that the patient refused to begin new medication "until hearing from [name] that she should take it"  Performed chart review and [facility] Dr Allyne GeeSanders had attempted to contact the patient on 5/29 and left a voice message regarding new medication - Apolinar JunesBrandon reports the patients phone has been acting up and requests a follow up call to the patient between 3-5 pm   Sent message to Dr Allyne GeeSanders via secure chat to provide information regarding patients reluctance to take medication until speaking with her provider. Asked that [name] or a member of the medical team contact the patient between 3-5 pm to review medication change  Assessed for other barrier to medication compliance- Apolinar JunesBrandon reports the patients medication cost $80.00 and he was not aware he could use prescriptions for lancets and glucometer strips for insurance to assist with cost  Advised Apolinar JunesBrandon that CCM SW would collaborate with the patients medical team to assist with medication assistance as well as prescription coverage of lancets and testing strips  Collaboration with CCM RN Case Manager to  provide an update on medication compliance  Collaboration with CCM Pharm D requesting assistance with medication costs as well as prescription coverage of testing supplies  CCM RN CM Interventions: Completed on 08/03/18 with caregiver Lessie DingsSierra Haith   Discussed new insulin regimen with caregiver Lessie DingsSierra Haith   Reviewed patient should discontinue the Novolog insulin and start Tresiba 35u around 8-9 pm daily starting today  [name] verbalizes understanding and plans to pick-up the [name] today  Discussed importance of all caregivers involved are knowledgeable and are aware of the new insulin - [name] confirms all caregivers are aware  Discussed plans with patient for ongoing care management follow up and provided patient with direct contact information for care management team  CCM PharmD Interventions: Completed on 08/06/18   Call placed to [name] (caregiver).  Patient is eating more consistently.  Instructed caregiver/grandson to call PCP office or CCM team if concerned about BGs.  Counseled on S/SX hypoglycemia and how to treat.  [facility]--she will be going to the home on Tuesdays and report BGs back to PCP & CCM team in order to adjust insulin appropriately.  Patient has successfully transitioned to once daily [name] and her dose has been titrated to 38 units.  Grandson [name]([place]) reports blood glucose <200 at this time.  I requested patient's grandson to have family check blood sugars throughout the day around meal times and continue checking FBG.  Case discussed with PCP, Dr. Dorothyann Pengobyn Sanders.  Last A1c 10.2 on 5.11.20 (down from 12.1)--will continue to follow and adjust insulin  as appropriate  Patient Self Care Activities:  . Currently UNABLE TO independently manage diabetes; chronic conditions  Please see past updates related to this goal by clicking on the "Past Updates" button in the selected goal         The patient verbalized understanding of  instructions provided today and declined a print copy of patient instruction materials.   The care management team will reach out to the patient again over the next 14 days.   Regina Eck, PharmD, BCPS Clinical Pharmacist, [facility] [place]: [phone]

## 2018-08-19 DIAGNOSIS — E7800 Pure hypercholesterolemia, unspecified: Secondary | ICD-10-CM

## 2018-08-19 DIAGNOSIS — I110 Hypertensive heart disease with heart failure: Secondary | ICD-10-CM | POA: Diagnosis not present

## 2018-08-19 DIAGNOSIS — Z6834 Body mass index (BMI) 34.0-34.9, adult: Secondary | ICD-10-CM

## 2018-08-19 DIAGNOSIS — I509 Heart failure, unspecified: Secondary | ICD-10-CM | POA: Diagnosis not present

## 2018-08-19 DIAGNOSIS — N183 Chronic kidney disease, stage 3 (moderate): Secondary | ICD-10-CM | POA: Diagnosis not present

## 2018-08-19 DIAGNOSIS — E1122 Type 2 diabetes mellitus with diabetic chronic kidney disease: Secondary | ICD-10-CM | POA: Diagnosis not present

## 2018-08-19 DIAGNOSIS — E669 Obesity, unspecified: Secondary | ICD-10-CM

## 2018-08-19 DIAGNOSIS — M25511 Pain in right shoulder: Secondary | ICD-10-CM

## 2018-08-20 ENCOUNTER — Telehealth: Payer: Medicare Other

## 2018-08-24 ENCOUNTER — Telehealth: Payer: Medicare Other

## 2018-08-27 ENCOUNTER — Ambulatory Visit: Payer: Self-pay

## 2018-08-27 DIAGNOSIS — E1122 Type 2 diabetes mellitus with diabetic chronic kidney disease: Secondary | ICD-10-CM

## 2018-08-27 DIAGNOSIS — N183 Chronic kidney disease, stage 3 unspecified: Secondary | ICD-10-CM

## 2018-08-27 DIAGNOSIS — I10 Essential (primary) hypertension: Secondary | ICD-10-CM | POA: Diagnosis not present

## 2018-08-27 DIAGNOSIS — Z794 Long term (current) use of insulin: Secondary | ICD-10-CM | POA: Diagnosis not present

## 2018-08-27 DIAGNOSIS — M2550 Pain in unspecified joint: Secondary | ICD-10-CM

## 2018-08-27 NOTE — Patient Instructions (Signed)
Visit Information  Goals Addressed      Patient Stated   . I don't know if insulin regimen is working (pt-stated)       Current Barriers:  [name] Kitchen Knowledge Deficits related to managing diabetes  Pharmacist Clinical Goal(s):  [name] Kitchen Over the next 90 days, patient will demonstrate Improved medication adherence as evidenced by BGs within range, FBG<130, no hypoglycemia . Over the next 90 days, patient will work with CCM team to address needs related to managing diabetes 08/03/18: re-established target goal date to 27 days due to treatment delays secondary to COVID-19  CCM RN CM Interventions: Completed on 08/27/18 with caregiver Ivory Broad   Assessed for medication adherence; discussed Tresiba dosage and frequency - per [place] Ms. Salaam is taking her insulin in the am   Assessed for adherence and comfort level with monitoring patient's [place] states the number are recorded and located at Ms. Charles Mix does not currently access to discuss - [place] requested I contact the [facility] RN to obtain [place] outbound call to nurse Katharine Look with [facility]; discussed Ms. Gittens's CBG's are being checked every morning by the family and recorded  Discussed CBG's this past week (pt runs lower in the am, lowest BG 70, average in the 170's with the highest being 258 on Saturday  Collaboration with embedded Pharm D Lottie Dawson; provided CBG's and discussed patient is taking her [place] Aas in the am verses the pm  Discussed plans with patient for ongoing care management follow up and provided patient with direct contact information for care management team  Patient Self Care Activities:  . Currently UNABLE TO independently manage diabetes; chronic conditions  Please see past updates related to this goal by clicking on the "Past Updates" button in the selected goal       Other   . "Her pain is so bad from her arthritis"       Current Barriers:  [name] Kitchen Knowledge Deficits related to pain  management for Arthritis  Nurse Case Manager Clinical Goal(s):  [name] Kitchen Over the next 60 days, patient will demonstrate a decrease in Pain exacerbations as evidenced by patient will exhibit increased and improved physical mobility and will be able to particpate in PT 08/13/18: Target goal date re-established to 60 days due to services were put on hold but due to resume on 08/16/18 -Improving as of 08/27/18  CCM RN CM Interventions:  Completed on 08/27/18: completed call with caregiver Ivory Broad  . Evaluation of current treatment plan related to pain management for arthritis and patient's adherence to plan as established by provider. . Assessed for readmission of in-home PT; per caregiver PT and OT have resumed, patient has had 1 visit and is more engaged and is c/o having NO pain . Discussed plans with patient for ongoing care management follow up and provided patient with direct contact information for care management team . Placed outbound call to nurse Katharine Look with [facility] - per Katharine Look, patient's stamina has improved overall, the patient denies having pain and is now ambulating with a cane in place of a walker  Patient Self Care Activities:   Patient is currently UNABLE TO independently provide self-care or self administer medications . Attends all scheduled provider appointments (grandson and family are providing transportation) . Grandson or caregiver [place] calls pharmacy for medication refills . Needs assistance with ADL's independently . Needs assistance with IADL's independently . [name] calls provider office for new concerns or questions  Please see past updates related to this goal by clicking on the "Past Updates" button in the selected goal        The patient verbalized understanding of instructions provided today and declined a print copy of patient instruction materials.   The care management team will reach out to the patient again over the next 2-3 weeks.    Delsa SaleAngel Takeyah Wieman, RN, BSN, CCM Care Management Coordinator [facility]  Direct Phone: [phone]

## 2018-08-27 NOTE — Chronic Care Management (AMB) (Signed)
Chronic Care Management   Follow Up Note   08/27/2018 Name: SHAYA REDDICK MRN: [id number] DOB: [birth year]  Referred by: Glendale Chard, MD Reason for referral : Chronic Care Management (CCM RNCM Telephone Outreach)   KINZA GOUVEIA is a 85 y.o. year old female who is a primary care patient of Glendale Chard, MD. The CCM team was consulted for assistance with chronic disease management and care coordination needs.    Review of patient status, including review of consultants reports, relevant laboratory and other test results, and collaboration with appropriate care team members and the patient's provider was performed as part of comprehensive patient evaluation and provision of chronic care management services.    I spoke with the caregiver Ivory Broad for Ms. Puello by telephone today.   Goals Addressed      Patient Stated   . I don't know if insulin regimen is working (pt-stated)       Current Barriers:  [name] Kitchen Knowledge Deficits related to managing diabetes  Pharmacist Clinical Goal(s):  [name] Kitchen Over the next 90 days, patient will demonstrate Improved medication adherence as evidenced by BGs within range, FBG<130, no hypoglycemia . Over the next 90 days, patient will work with CCM team to address needs related to managing diabetes 08/03/18: re-established target goal date to 27 days due to treatment delays secondary to COVID-19  CCM RN CM Interventions: Completed on 08/27/18 with caregiver Ivory Broad   Assessed for medication adherence; discussed Tresiba dosage and frequency - per [place] Ms. Hinkley is taking her insulin in the am   Assessed for adherence and comfort level with monitoring patient's [place] states the number are recorded and located at Ms. [place] does not currently access to discuss - [place] requested I contact the [facility] RN to obtain [place] outbound call to nurse Katharine Look with [facility]; discussed Ms. Baray's CBG's are being checked every  morning by the family and recorded  Discussed CBG's this past week (pt runs lower in the am, lowest BG 70, average in the 170's with the highest being 258 on Saturday  Collaboration with embedded Pharm D Lottie Dawson; provided CBG's and discussed patient is taking her Tyler Aas in the am verses the pm  Discussed plans with patient for ongoing care management follow up and provided patient with direct contact information for care management team  Patient Self Care Activities:  . Currently UNABLE TO independently manage diabetes; chronic conditions  Please see past updates related to this goal by clicking on the "Past Updates" button in the selected goal      Other   . "Her pain is so bad from her arthritis"       Current Barriers:  [name] Kitchen Knowledge Deficits related to pain management for Arthritis  Nurse Case Manager Clinical Goal(s):  [name] Kitchen Over the next 60 days, patient will demonstrate a decrease in Pain exacerbations as evidenced by patient will exhibit increased and improved physical mobility and will be able to particpate in PT 08/13/18: Target goal date re-established to 60 days due to services were put on hold but due to resume on 08/16/18 -Improving as of 08/27/18  CCM RN CM Interventions:  Completed on 08/27/18: completed call with caregiver Ivory Broad  . Evaluation of current treatment plan related to pain management for arthritis and patient's adherence to plan as established by provider. . Assessed for readmission of in-home PT; per caregiver PT and OT have resumed, patient has had 1 visit and is  more engaged and is c/o having NO pain . Discussed plans with patient for ongoing care management follow up and provided patient with direct contact information for care management team . Placed outbound call to [facility]Sandra [facility] - per Dois DavenportSandra, patient's stamina has improved overall, the patient denies having pain and is now ambulating with a cane in place of a walker  Patient  Self Care Activities:   Patient is currently UNABLE TO independently provide self-care or self administer medications . Attends all scheduled provider appointments (grandson and family are providing transportation) . Grandson or caregiver [name] calls pharmacy for medication refills . Needs assistance with ADL's independently . Needs assistance with IADL's independently . Leanna BattlesGrandson Brandon calls provider office for new concerns or questions  Please see past updates related to this goal by clicking on the "Past Updates" button in the selected goal         The care management team will reach out to the patient again over the next 2-3 weeks.   Delsa SaleAngel Jilliana Burkes, RN, BSN, CCM Care Management Coordinator [facility]  Direct Phone: [phone]

## 2018-08-30 ENCOUNTER — Telehealth: Payer: Self-pay

## 2018-09-01 ENCOUNTER — Ambulatory Visit: Payer: Medicare Other | Admitting: Podiatry

## 2018-09-01 ENCOUNTER — Encounter

## 2018-09-01 ENCOUNTER — Ambulatory Visit (INDEPENDENT_AMBULATORY_CARE_PROVIDER_SITE_OTHER): Payer: Medicare Other | Admitting: Pharmacist

## 2018-09-01 DIAGNOSIS — I10 Essential (primary) hypertension: Secondary | ICD-10-CM | POA: Diagnosis not present

## 2018-09-01 DIAGNOSIS — Z794 Long term (current) use of insulin: Secondary | ICD-10-CM

## 2018-09-01 DIAGNOSIS — N179 Acute kidney failure, unspecified: Secondary | ICD-10-CM | POA: Diagnosis not present

## 2018-09-01 DIAGNOSIS — N183 Chronic kidney disease, stage 3 (moderate): Secondary | ICD-10-CM | POA: Diagnosis not present

## 2018-09-01 DIAGNOSIS — E1122 Type 2 diabetes mellitus with diabetic chronic kidney disease: Secondary | ICD-10-CM | POA: Diagnosis not present

## 2018-09-01 NOTE — Patient Instructions (Signed)
Visit Information  Goals Addressed            This Visit's Progress     Patient Stated   . I don't know if insulin regimen is working (pt-stated)       Current Barriers:  [name] Kitchen Knowledge Deficits related to managing diabetes  Pharmacist Clinical Goal(s):  [name] Kitchen Over the next 90 days, patient will demonstrate Improved medication adherence as evidenced by BGs within range, FBG<130, no hypoglycemia . Over the next 90 days, patient will work with CCM team to address needs related to managing diabetes 08/03/18: re-established target goal date to 55 days due to treatment delays secondary to COVID-19  CCM RN CM Interventions: Completed on 08/27/18 with caregiver Ivory Broad   Assessed for medication adherence; discussed Tresiba dosage and frequency - per [place] Ms. Dolberry is taking her insulin in the am   Assessed for adherence and comfort level with monitoring patient's [place] states the number are recorded and located at Ms. [place] does not currently access to discuss - [place] requested I contact the [facility] RN to obtain [place] outbound call to nurse Katharine Look with [facility]; discussed Ms. Eckles's CBG's are being checked every morning by the family and recorded  Discussed CBG's this past week (pt runs lower in the am, lowest BG 70, average in the 170's with the highest being 258 on Saturday  Collaboration with embedded Pharm D Lottie Dawson; provided CBG's and discussed patient is taking her Tyler Aas in the am verses the pm  Discussed plans with patient for ongoing care management follow up and provided patient with direct contact information for care management team  CCM PharmD Interventions: Completed on 09/01/18 with caregiver/grandson [name] . Erlene Quan states his grandmother is doing "so much better".  He denies hypoglycemia (Bg<70).  He states that Bgs are usually in the 150-170 range, however fluctuate depending on eating habits.  Patient consistently  eats 2 meals daily.  Will continue to follow and adjust insulin as needed.  Ms. Zhao is currently injecting Tresiba 38 units daily. . Will explore moving Tresiba to nighttime if patient and caregivers able to transition appropriately.  Tyler Aas was given in the AM due to patient going without insulin for 24 hours.  At that time, it was best to give [place] as soon as possible. . Will send Erlene Quan application to obtain free [place] through [employer].  He verbalizes understanding. . Encouraged patient to call PCP office or CCM team if needs arise.  Patient Self Care Activities:  . Currently UNABLE TO independently manage diabetes; chronic conditions  Please see past updates related to this goal by clicking on the "Past Updates" button in the selected goal         The patient verbalized understanding of instructions provided today and declined a print copy of patient instruction materials.   The care management team will reach out to the patient again over the next 3 weeks.  Regina Eck, PharmD, BCPS Clinical Pharmacist, [facility] [place]: [phone]

## 2018-09-01 NOTE — Progress Notes (Signed)
Chronic Care Management  Visit Note  09/01/2018 Name: Helen Richardson MRN: [id number] DOB: 25-Aug-1933  Referred by: Helen Chard, MD Reason for referral : Chronic Care Management   Helen Richardson is a 85 y.o. year old female who is a primary care patient of Helen Chard, MD. The CCM team was consulted for assistance with chronic disease management and care coordination needs.   Review of patient status, including review of consultants reports, relevant laboratory and other test results, and collaboration with appropriate care team members and the patient's provider was performed as part of comprehensive patient evaluation and provision of chronic care management services.    I spoke with patient's grandson, Helen Richardson by telephone today.  Objective:   Goals Addressed            This Visit's Progress     Patient Stated    I don't know if insulin regimen is working (pt-stated)       Current Barriers:   Knowledge Deficits related to managing diabetes  Pharmacist Clinical Goal(s):   Over the next 90 days, patient will demonstrate Improved medication adherence as evidenced by BGs within range, FBG<130, no hypoglycemia  Over the next 90 days, patient will work with CCM team to address needs related to managing diabetes 08/03/18: re-established target goal date to 90 days due to treatment delays secondary to COVID-19  CCM RN CM Interventions: Completed on 08/27/18 with caregiver Helen Richardson   Assessed for medication adherence; discussed Tresiba dosage and frequency - per Helen Richardson is taking her insulin in the am   Assessed for adherence and comfort level with monitoring patient's Helen Richardson states the number are recorded and located at Helen Richardson does not currently access to discuss - Helen Richardson I contact the [facility] RN to obtain [place] outbound call to nurse Helen Richardson with Helen Richardson; discussed Helen Richardson's CBG's are being checked  every morning by the family and recorded  Discussed CBG's this past week (pt runs lower in the am, lowest BG 70, average in the 170's with the highest being 258 on Saturday  Collaboration with embedded Pharm D Helen Richardson; provided CBG's and discussed patient is taking her Tyler Aas in the am verses the pm  Discussed plans with patient for ongoing care management follow up and provided patient with direct contact information for care management team  CCM PharmD Interventions: Completed on 09/01/18 with caregiver/grandson Helen Richardson states his grandmother is doing "so much better".  He denies hypoglycemia (Bg<70).  He states that Bgs are usually in the 150-170 range, however fluctuate depending on eating habits.  Patient consistently eats 2 meals daily.  Will continue to follow and adjust insulin as needed.  Ms. Ryser is currently injecting Tresiba 38 units daily. Counseled on hypoglycemia protocol  Will explore moving Tresiba to nighttime if patient and caregivers able to transition appropriately.  Tyler Aas was given in the AM due to patient going without insulin for 24 hours.  At that time, it was best to give [place] as soon as possible.  Will send Helen Richardson patient assistance application to obtain free Tresiba through manufacturer.  He verbalizes understanding.  Encouraged patient's grandson to call PCP office or CCM team if needs arise.  Patient Self Care Activities:   Currently UNABLE TO independently manage diabetes; chronic conditions  Please see past updates related to this goal by clicking on the "Past Updates" button in the selected goal  Plan:   The care management team will reach out to the patient again over the next 3 weeks.  Helen Richardson, PharmD, BCPS Clinical Pharmacist, [facility]  [facility]  Direct Dial: [phone]

## 2018-09-10 ENCOUNTER — Other Ambulatory Visit: Payer: Self-pay | Admitting: Internal Medicine

## 2018-09-13 ENCOUNTER — Other Ambulatory Visit: Payer: Self-pay

## 2018-09-13 ENCOUNTER — Telehealth: Payer: Self-pay

## 2018-09-15 ENCOUNTER — Ambulatory Visit: Payer: Medicare Other | Admitting: Pharmacist

## 2018-09-15 DIAGNOSIS — N179 Acute kidney failure, unspecified: Secondary | ICD-10-CM

## 2018-09-15 DIAGNOSIS — I10 Essential (primary) hypertension: Secondary | ICD-10-CM

## 2018-09-15 DIAGNOSIS — E1122 Type 2 diabetes mellitus with diabetic chronic kidney disease: Secondary | ICD-10-CM

## 2018-09-15 DIAGNOSIS — N183 Chronic kidney disease, stage 3 unspecified: Secondary | ICD-10-CM

## 2018-09-15 NOTE — Progress Notes (Signed)
Chronic Care Management   Visit Note  09/15/2018 Name: Helen Richardson MRN: [id number] DOB: 04-21-1933  Referred by: Glendale Chard, MD Reason for referral : Chronic Care Management   Helen Richardson is a 85 y.o. year old female who is a primary care patient of Glendale Chard, MD. The CCM team was consulted for assistance with chronic disease management and care coordination needs.   Review of patient status, including review of consultants reports, relevant laboratory and other test results, and collaboration with appropriate care team members and the patient's provider was performed as part of comprehensive patient evaluation and provision of chronic care management services.    I spoke with patient's caregiver/grandson, Helen Richardson, by telephone today.  Objective:   Goals Addressed            This Visit's Progress     Patient Stated   . I don't know if insulin regimen is working (pt-stated)       Current Barriers:  [name] Kitchen Knowledge Deficits related to managing diabetes  Pharmacist Clinical Goal(s):  [name] Kitchen Over the next 90 days, patient will demonstrate Improved medication adherence as evidenced by BGs within range, FBG<130, no hypoglycemia . Over the next 90 days, patient will work with CCM team to address needs related to managing diabetes 08/03/18: re-established target goal date to 50 days due to treatment delays secondary to COVID-19  CCM RN CM Interventions: Completed on 08/27/18 with caregiver Helen Richardson   Assessed for medication adherence; discussed Tresiba dosage and frequency - per Helen Richardson is taking her insulin in the am   Assessed for adherence and comfort level with monitoring patient's Helen Richardson states the number are recorded and located at Helen Richardson does not currently access to discuss - Helen requested I contact the [facility] RN to obtain [place] outbound call to nurse Helen Richardson with [facility]; discussed Helen Richardson's CBG's are  being checked every morning by the family and recorded  Discussed CBG's this past week (pt runs lower in the am, lowest BG 70, average in the 170's with the highest being 258 on Saturday  Collaboration with embedded Pharm D Lottie Dawson; provided CBG's and discussed patient is taking her Tyler Aas in the am verses the pm  Discussed plans with patient for ongoing care management follow up and provided patient with direct contact information for care management team  CCM PharmD Interventions: Completed on 09/15/18 with caregiver/grandson Helen Richardson . Helen Richardson states his grandmother continues to be doing better in terms of her blood sugar/diabetes.  She is having some muscle/ortho pain that is being addressed by the appropriate discipline.   [name] Kitchen He denies hypoglycemia (Bg<70).  He states that BGs are usually in the 150-170 range, however fluctuate depending on eating habits.  Patient consistently eats 2 meals daily.  Will continue to follow and adjust insulin as needed.  Ms. Top is currently injecting Tresiba 38 units daily. . Will explore moving Tresiba to nighttime if patient and caregivers able to transition appropriately.  Tyler Aas was given in the AM due to patient going without insulin for 24 hours.  At that time, it was best to give [place] as soon as possible. . Patient also continues to take Tradjenta daily.  He blood sugars have bastly improved compared to initial consult.  Will continue to optimize therapy . Prepare and mailed application to obtain free Tresiba through manufacturer.  Helen Richardson verbalizes understanding. . Encouraged patient/grandson to call PCP office or CCM team  if needs arise.  Patient Self Care Activities:  . Currently UNABLE TO independently manage diabetes; chronic conditions  Please see past updates related to this goal by clicking on the "Past Updates" button in the selected goal      . My test strips are too expensive (pt-stated)       Current Barriers:  .  Financial Barriers-needs RX for test strips  Pharmacist Clinical Goal(s):  [name] Kitchen. Over the next 30 days, patient will work with PharmD & PCP to address needs related to test strip & insulin affordability  Interventions: . Collaboration with provider re: medication management  . Patient using One Touch Verio glucometer--CMA assisted with sending new RX to be covered under patient's insurance.  Pharmacy states they have not received.  Will request another RX. [name] Kitchen. Patient's grandson & patient are agreeable to participate in patient assistance program for [name] (pens) through [employer].  Application was discussed, prepared and mailed today. . Encouraged family to reach out if questions arise. . Will continue to follow  Patient Self Care Activities:  With help from caregivers . Self administers medications as prescribed . Attends all scheduled provider appointments . Calls pharmacy for medication refills  Please see past updates related to this goal by clicking on the "Past Updates" button in the selected goal          Plan:   The care management team will reach out to the patient again over the next 3 weeks.  Kieth BrightlyJulie Dattero Ryanna Teschner, PharmD, BCPS Clinical Pharmacist, [name]  [facility]  Direct Dial: [phone]

## 2018-09-15 NOTE — Patient Instructions (Signed)
Visit Information  Goals Addressed            This Visit's Progress     Patient Stated   . I don't know if insulin regimen is working (pt-stated)       Current Barriers:  [name] Kitchen. Knowledge Deficits related to managing diabetes  Pharmacist Clinical Goal(s):  [name] Kitchen. Over the next 90 days, patient will demonstrate Improved medication adherence as evidenced by BGs within range, FBG<130, no hypoglycemia . Over the next 90 days, patient will work with CCM team to address needs related to managing diabetes 08/03/18: re-established target goal date to 90 days due to treatment delays secondary to COVID-19  CCM RN CM Interventions: Completed on 08/27/18 with caregiver Lessie DingsSierra Haith   Assessed for medication adherence; discussed Tresiba dosage and frequency - per [name] Ms. Bretado is taking her insulin in the am   Assessed for adherence and comfort level with monitoring patient's CBGs -[name] states the number are recorded and located at Ms. Rote's home - Raoul PitchSierra does not currently access to discuss - [name] [facility] obtain CBGs  Placed outbound call to [facility]Sandra [facility]; discussed Ms. Linch's CBG's are being checked every morning by the family and recorded  Discussed CBG's this past week (pt runs lower in the am, lowest BG 70, average in the 170's with the highest being 258 on Saturday  Collaboration with embedded Pharm D Vanice SarahJulie Wake Conlee; provided CBG's and discussed patient is taking her [name] in the am verses the pm  Discussed plans with patient for ongoing care management follow up and provided patient with direct contact information for care management team  CCM PharmD Interventions: Completed on 09/15/18 with caregiver/grandson Katherine RoanBrandon Popescu . Apolinar JunesBrandon states his grandmother continues to be doing better in terms of her blood sugar/diabetes.  She is having some muscle/ortho pain that is being addressed by the appropriate discipline.   [name] Kitchen. He denies  hypoglycemia (Bg<70).  He states that BGs are usually in the 150-170 range, however fluctuate depending on eating habits.  Patient consistently eats 2 meals daily.  Will continue to follow and adjust insulin as needed.  Ms. Jyl HeinzChavis is currently injecting Tresiba 38 units daily. . Will explore moving Tresiba to nighttime if patient and caregivers able to transition appropriately.  [name] was given in the AM due to patient going without insulin for 24 hours.  At that time, it was best to give [name] as soon as possible. . Patient also continues to take Tradjenta daily.  He blood sugars have bastly improved compared to initial consult.  Will continue to optimize therapy . Prepare and mailed application to obtain free Tresiba through manufacturer.  Brandon verbalizes understanding. . Encouraged patient/grandson to call PCP office or CCM team if needs arise.  Patient Self Care Activities:  . Currently UNABLE TO independently manage diabetes; chronic conditions  Please see past updates related to this goal by clicking on the "Past Updates" button in the selected goal      . My test strips are too expensive (pt-stated)       Current Barriers:  . Financial Barriers-needs RX for test strips  Pharmacist Clinical Goal(s):  [name] Kitchen. Over the next 30 days, patient will work with PharmD & PCP to address needs related to test strip & insulin affordability  Interventions: . Collaboration with provider re: medication management  . Patient using One Touch Verio glucometer--CMA assisted with sending new RX to be covered under patient's insurance.  Pharmacy states they have not received.  Will request another RX. [name] Kitchen Patient's grandson & patient are agreeable to participate in patient assistance program for [place] (pens) through [employer].  Application was discussed, prepared and mailed today. . Encouraged family to reach out if questions arise. . Will continue to follow  Patient Self Care Activities:  With help  from caregivers . Self administers medications as prescribed . Attends all scheduled provider appointments . Calls pharmacy for medication refills  Please see past updates related to this goal by clicking on the "Past Updates" button in the selected goal         The patient verbalized understanding of instructions provided today and declined a print copy of patient instruction materials.   The care management team will reach out to the patient again over the next 3 weeks.  Regina Eck, PharmD, BCPS Clinical Pharmacist, [facility] [place]: [phone]

## 2018-09-16 ENCOUNTER — Telehealth: Payer: Medicare Other

## 2018-09-17 ENCOUNTER — Other Ambulatory Visit: Payer: Self-pay

## 2018-09-17 ENCOUNTER — Ambulatory Visit: Payer: Self-pay

## 2018-09-17 ENCOUNTER — Telehealth: Payer: Medicare Other

## 2018-09-17 DIAGNOSIS — E1122 Type 2 diabetes mellitus with diabetic chronic kidney disease: Secondary | ICD-10-CM

## 2018-09-17 DIAGNOSIS — M2550 Pain in unspecified joint: Secondary | ICD-10-CM

## 2018-09-17 DIAGNOSIS — I10 Essential (primary) hypertension: Secondary | ICD-10-CM

## 2018-09-17 DIAGNOSIS — N183 Chronic kidney disease, stage 3 unspecified: Secondary | ICD-10-CM

## 2018-09-17 MED ORDER — GLUCOSE BLOOD VI STRP: ORAL_STRIP | 12 refills | Status: DC

## 2018-09-17 NOTE — Patient Instructions (Signed)
Visit Information  Goals Addressed      Patient Stated   . I don't know if insulin regimen is working (pt-stated)   On track    Current Barriers:  [name] Kitchen Knowledge Deficits related to managing diabetes  Pharmacist Clinical Goal(s):  [name] Kitchen Over the next 90 days, patient will demonstrate Improved medication adherence as evidenced by BGs within range, FBG<130, no hypoglycemia . Over the next 90 days, patient will work with CCM team to address needs related to managing diabetes 08/03/18: re-established target goal date to 1 days due to treatment delays secondary to COVID-19  CCM RN CM Interventions: Completed on 08/27/18 with caregiver Ivory Broad   Assessed for medication adherence; discussed Tresiba dosage and frequency - per [place] Ms. Seeberger is taking her insulin in the am   Assessed for adherence and comfort level with monitoring patient's [place] states the number are recorded and located at Ms. Rembrandt does not currently access to discuss - [place] requested I contact the [facility] RN to obtain [place] outbound call to nurse Katharine Look with [facility]; discussed Ms. Sarnowski's CBG's are being checked every morning by the family and recorded  Discussed CBG's this past week (pt runs lower in the am, lowest BG 70, average in the 170's with the highest being 258 on Saturday  Collaboration with embedded Pharm D Lottie Dawson; provided CBG's and discussed patient is taking her Tyler Aas in the am verses the pm  Discussed plans with patient for ongoing care management follow up and provided patient with direct contact information for care management team  CCM PharmD Interventions: Completed on 09/15/18 with caregiver/grandson [name] . Erlene Quan states his grandmother continues to be doing better in terms of her blood sugar/diabetes.  She is having some muscle/ortho pain that is being addressed by the appropriate discipline.   [name] Kitchen He denies hypoglycemia (Bg<70).  He states that  BGs are usually in the 150-170 range, however fluctuate depending on eating habits.  Patient consistently eats 2 meals daily.  Will continue to follow and adjust insulin as needed.  Ms. Mickiewicz is currently injecting Tresiba 38 units daily. . Will explore moving Tresiba to nighttime if patient and caregivers able to transition appropriately.  Tyler Aas was given in the AM due to patient going without insulin for 24 hours.  At that time, it was best to give [place] as soon as possible. . Patient also continues to take Tradjenta daily.  He blood sugars have bastly improved compared to initial consult.  Will continue to optimize therapy . Prepare and mailed application to obtain free Tresiba through manufacturer.  Brandon verbalizes understanding. . Encouraged patient/grandson to call PCP office or CCM team if needs arise.  Patient Self Care Activities:  . Currently UNABLE TO independently manage diabetes; chronic conditions  Please see past updates related to this goal by clicking on the "Past Updates" button in the selected goal       Other   . "Her pain is so bad from her arthritis"   On track    Current Barriers:  [name] Kitchen Knowledge Deficits related to pain management for Arthritis  Nurse Case Manager Clinical Goal(s):  [name] Kitchen Over the next 60 days, patient will demonstrate a decrease in Pain exacerbations as evidenced by patient will exhibit increased and improved physical mobility and will be able to particpate in PT 08/13/18: Target goal date re-established to 60 days due to services were put on hold but due to resume on 08/16/18 -  Improving as of 08/27/18  CCM RN CM Interventions:  09/17/18 call completed with grandson Katherine RoanBrandon Denley  . Evaluation of current treatment plan related to pain management for arthritis and patient's adherence to plan as established by provider . Discussed patient has experienced increased shoulder pain this week and was given an injection by the Orthopedic MD - per grandson  the injection was effective as evidence by patient was able to be independent with self care this am . Discussed patient continues to receive in home PT services and is participating in the HEP . Discussed Mr. Jyl HeinzChavis does not have any questions or concerns at this time and feels his grandmother has all services and DME needed at this time . Discussed plans with patient for ongoing care management follow up and provided patient with direct contact information for care management team  Patient Self Care Activities:   Patient is currently UNABLE TO independently provide self-care or self administer medications . Attends all scheduled provider appointments (grandson and family are providing transportation) . Grandson or caregiver [name] calls pharmacy for medication refills . Needs assistance with ADL's independently . Needs assistance with IADL's independently . Leanna BattlesGrandson Brandon calls provider office for new concerns or questions  Please see past updates related to this goal by clicking on the "Past Updates" button in the selected goal     . "I think my grandmother has CHF"   On track    Grandson stated:   Current Barriers:  [name] Kitchen. Knowledge Deficits related to cause for lower leg edema and recent fall   Nurse Case Manager Clinical Goal(s):  [name] Kitchen. Over the next 90 days, patient and grandson Katherine RoanBrandon Kasinger will work with the CCM team to address care coordination needs and disease specific education needs for health related comorbidies and how to self manage. 07/15/18-Target goal date changed to 90 days  CCM RN CM Interventions:  09/17/18 completed call with grandson Katherine RoanBrandon Vivas  . Evaluation of current treatment plan related to Self health management for CHF  and patient's adherence to plan as established by provider. . Discussed plans with patient for ongoing care management follow up and provided patient with direct contact information for care management team . Provided patient with printed  educational materials related to CHF zone management tool   Patient Self Care Activities:  W[place] Mood. Grandson Brandon Brissett and caregiver [name] verbalizes understanding of the education/information provided during today's call   Patient is currently UNABLE TO independently provide self-care or self administer medications . Attends all scheduled provider appointments (grandson and family are providing transportation) . Grandson or caregiver [name] calls pharmacy for medication refills . Needs assistance with ADL's independently . Needs assistance with IADL's independently . Leanna BattlesGrandson Brandon calls provider office for new concerns or questions  Please see past updates related to this goal by clicking on the "Past Updates" button in the selected goal        The patient verbalized understanding of instructions provided today and declined a print copy of patient instruction materials.   Telephone follow up appointment with care management team member scheduled for: 10/08/18  Delsa SaleAngel Ercel Normoyle, RN, BSN, CCM Care Management Coordinator [facility]  Direct Phone: [phone]

## 2018-09-17 NOTE — Chronic Care Management (AMB) (Signed)
Chronic Care Management   Follow Up Note   09/17/2018 Name: Helen Richardson MRN: [id number] DOB: 06/09/1933  Referred by: Helen Richardson Reason for referral : Chronic Care Management (CCM RNCM Telephone Follow up )   Helen Richardson is an 85 y.o. year old female who is a primary care patient of Helen Richardson. The CCM team was consulted for assistance with chronic disease management and care coordination needs.    Review of patient status, including review of consultants reports, relevant laboratory and other test results, and collaboration with appropriate care team members and the patient's provider was performed as part of comprehensive patient evaluation and provision of chronic care management services.    I spoke with patient's grandson Helen Richardson by telephone today for a CCM follow up.   Goals Addressed      Patient Stated    I don't know if insulin regimen is working (pt-stated)   On track    Current Barriers:   Knowledge Deficits related to managing diabetes  Pharmacist Clinical Goal(s):   Over the next 90 days, patient will demonstrate Improved medication adherence as evidenced by BGs within range, FBG<130, no hypoglycemia  Over the next 90 days, patient will work with CCM team to address needs related to managing diabetes 08/03/18: re-established target goal date to 90 days due to treatment delays secondary to COVID-19  CCM RN CM Interventions: Completed on 08/27/18 with caregiver Helen Richardson   Assessed for medication adherence; discussed Tresiba dosage and frequency - per Helen Richardson is taking her insulin in the am   Assessed for adherence and comfort level with monitoring patient's CBGs -Helen Richardson states the number are recorded and located at Helen Richardson's home - Helen Richardson does not currently access to discuss - Helen [facility]  Placed outbound call [facility]; discussed Helen Richardson's CBG's are  being checked every morning by the family and recorded  Discussed CBG's this past week (pt runs lower in the am, lowest BG 70, average in the 170's with the highest being 258 on Saturday  Collaboration with embedded Pharm D Helen Richardson; provided CBG's and discussed patient is taking her [name] in the am verses the pm  Discussed plans with patient for ongoing care management follow up and provided patient with direct contact information for care management team  CCM PharmD Interventions: Completed on 09/15/18 with caregiver/grandson Helen Richardson  Helen Richardson states his grandmother continues to be doing better in terms of her blood sugar/diabetes.  She is having some muscle/ortho pain that is being addressed by the appropriate discipline.    He denies hypoglycemia (Bg<70).  He states that BGs are usually in the 150-170 range, however fluctuate depending on eating habits.  Patient consistently eats 2 meals daily.  Will continue to follow and adjust insulin as needed.  [name] is currently injecting Tresiba 38 units daily.  Will explore moving Tresiba to nighttime if patient and caregivers able to transition appropriately.  [name] was given in the AM due to patient going without insulin for 24 hours.  At that time, it was best to give [name] as soon as possible.  Patient also continues to take Tradjenta daily.  He blood sugars have bastly improved compared to initial consult.  Will continue to optimize therapy  Prepare and mailed application to obtain free Tresiba through manufacturer.  Helen Richardson.  Encouraged patient/grandson to call PCP office or CCM team  if needs arise.  Patient Self Care Activities:   Currently UNABLE TO independently manage diabetes; chronic conditions  Please see past updates related to this goal by clicking on the "Past Updates" button in the selected goal        Other    "Her pain is so bad from her arthritis"   On track    Current  Barriers:   Knowledge Deficits related to pain management for Arthritis  Nurse Case Manager Clinical Goal(s):   Over the next 60 days, patient will demonstrate a decrease in Pain exacerbations as evidenced by patient will exhibit increased and improved physical mobility and will be able to particpate in PT 08/13/18: Target goal date re-established to 60 days due to services were put on hold but due to resume on 08/16/18 -Improving as of 08/27/18  CCM RN CM Interventions:  09/17/18 call completed with grandson Helen Richardson   Evaluation of current treatment plan related to pain management for arthritis and patient's adherence to plan as established by provider  Discussed patient has experienced increased shoulder pain this week and was given an injection by the Orthopedic Richardson - per grandson the injection was effective as evidence by patient was able to be independent with self care this am  Discussed patient continues to receive in home PT services and is participating in the HEP  Discussed Helen Richardson does not have any questions or concerns at this time and feels his grandmother has all services and DME needed at this time  Discussed plans with patient for ongoing care management follow up and provided patient with direct contact information for care management team  Patient Self Care Activities:   Patient is currently UNABLE TO independently provide self-care or self administer medications  Attends all scheduled provider appointments (grandson and family are providing transportation)  Games developer or caregiver Helen Richardson calls pharmacy for medication refills  Needs assistance with ADL's independently  Needs assistance with IADL's independently  Helen Richardson calls provider office for new concerns or questions  Please see past updates related to this goal by clicking on the "Past Updates" button in the selected goal      "I think my grandmother has CHF"   On track    Grandson  stated:   Current Barriers:   Knowledge Deficits related to cause for lower leg edema and recent fall   Nurse Case Manager Clinical Goal(s):   Over the next 90 days, patient and grandson Helen Richardson will work with the CCM team to address care coordination needs and disease specific education needs for health related comorbidies and how to self manage. 07/15/18-Target goal date changed to 29 days  CCM RN CM Interventions:  09/17/18 completed call with grandson Helen Richardson   Evaluation of current treatment plan related to Self health management for CHF  and patient's adherence to plan as established by provider.  Discussed plans with patient for ongoing care management follow up and provided patient with direct contact information for care management team  Provided patient with printed educational materials related to CHF zone management tool  Patient Self Care Activities:   Helen Richardson and caregiver Helen Richardson of the education/information provided during today's call   Patient is currently UNABLE TO independently provide self-care or self administer medications  Attends all scheduled provider appointments (grandson and family are providing transportation)  Games developer or caregiver Helen Richardson calls pharmacy for medication refills  Needs assistance with ADL's independently  Needs assistance with IADL's independently  Grandson  Helen Richardson calls provider office for new concerns or questions  Please see past updates related to this goal by clicking on the "Past Updates" button in the selected goal         Telephone follow up appointment with care management team member scheduled for: 10/08/18   Helen Richardson SaleAngel Chrisean Kloth, RN, BSN, CCM Care Management Coordinator [facility]  Direct Phone: [phone]

## 2018-09-28 ENCOUNTER — Telehealth: Payer: Self-pay | Admitting: Internal Medicine

## 2018-09-28 LAB — HM DIABETES EYE EXAM

## 2018-09-28 NOTE — Telephone Encounter (Signed)
I called the patient's grandson to schedule earlier AWV, but received a message that the call can't be completed at this time. VDM (DD)

## 2018-10-01 ENCOUNTER — Telehealth: Payer: Self-pay

## 2018-10-01 NOTE — Telephone Encounter (Signed)
I called the pt to see what urinary symptoms she was having because Demetria called and left a message with the answering service that the pt is having UTI symptoms.  The pt said that she isn't having any urinary symptoms that she is a little nauseated.  I asked the pt did she want to schedule a virtual appt and the pt said that she didn't know when she could do it.  I asked her to have her grandson brandon call the office back to schedule her a virtual appt.

## 2018-10-04 ENCOUNTER — Telehealth: Payer: Medicare Other

## 2018-10-05 ENCOUNTER — Encounter: Payer: Self-pay | Admitting: Internal Medicine

## 2018-10-08 ENCOUNTER — Telehealth: Payer: Medicare Other

## 2018-10-08 ENCOUNTER — Ambulatory Visit: Payer: Medicare Other | Admitting: Pharmacist

## 2018-10-08 NOTE — Progress Notes (Signed)
  Chronic Care Management   Outreach Note  10/08/2018 Name: Helen Richardson MRN: [id number] DOB: [birth year]  Referred by: Glendale Chard, MD Reason for referral : No chief complaint on file.   An unsuccessful telephone outreach was attempted today. The patient was referred to the case management team by for assistance with chronic care management and care coordination.   Follow Up Plan: The care management team will reach out to the patient again over the next 10-14 business days.    Regina Eck, PharmD, BCPS Clinical Pharmacist, [facility] [place]: [phone]

## 2018-10-18 ENCOUNTER — Telehealth: Payer: Self-pay

## 2018-10-18 NOTE — Telephone Encounter (Signed)
Left message for pt to call back. Need to know if she requested a wrist brace shoulder brace and ankle brace from [facility].  Also need to know if she requested tx wand for scalp. Does she have a dermatologist bc we have no records of her having psoriasis.

## 2018-10-21 ENCOUNTER — Telehealth: Payer: Self-pay

## 2018-10-26 ENCOUNTER — Other Ambulatory Visit: Payer: Self-pay

## 2018-10-26 ENCOUNTER — Encounter: Payer: Self-pay | Admitting: Podiatry

## 2018-10-26 ENCOUNTER — Ambulatory Visit: Payer: Medicare Other | Admitting: Podiatry

## 2018-10-26 VITALS — Temp 98.4°F

## 2018-10-26 DIAGNOSIS — M79676 Pain in unspecified toe(s): Secondary | ICD-10-CM

## 2018-10-26 DIAGNOSIS — B351 Tinea unguium: Secondary | ICD-10-CM

## 2018-10-26 DIAGNOSIS — Z794 Long term (current) use of insulin: Secondary | ICD-10-CM

## 2018-10-26 DIAGNOSIS — N183 Chronic kidney disease, stage 3 unspecified: Secondary | ICD-10-CM

## 2018-10-26 DIAGNOSIS — E1122 Type 2 diabetes mellitus with diabetic chronic kidney disease: Secondary | ICD-10-CM | POA: Diagnosis not present

## 2018-10-26 MED ORDER — TRESIBA FLEXTOUCH 200 UNIT/ML ~~LOC~~ SOPN: 36.0000 [IU] | PEN_INJECTOR | Freq: Every day | SUBCUTANEOUS | 2 refills | Status: DC

## 2018-10-26 NOTE — Progress Notes (Signed)
Patient ID: Helen Richardson, female   DOB: [birth year], 85 y.o.   MRN: [id number] Complaint:  Visit Type: Patient returns to my office for continued preventative foot care services. Complaint: Patient states" my nails have grown long and thick and become painful to walk and wear shoes" Patient has been diagnosed with DM with neuropathy She presents for preventative foot care services. No changes to ROS.  Patient has not been seen 6 months. Patient presents with her granddaughter.  Podiatric Exam: Vascular: dorsalis pedis and posterior tibial pulses are weakly  palpable bilateral. Capillary return is immediate. Temperature gradient is WNL. Skin turgor WNL  Sensorium: Diminished  Semmes Weinstein monofilament test. Normal tactile sensation bilaterally. Nail Exam: Pt has thick disfigured discolored nails with subungual debris noted bilateral entire nail hallux through fifth toenails Ulcer Exam: There is no evidence of ulcer or pre-ulcerative changes or infection. Orthopedic Exam: Muscle tone and strength are WNL. No limitations in general ROM. No crepitus or effusions noted. HAV  B/L.  Hammer toes . Skin: No Porokeratosis. No infection or ulcers  Diagnosis:  Tinea unguium, Pain in right toe, pain in left toes  Treatment & Plan Procedures and Treatment: Consent by patient was obtained for treatment procedures. The patient understood the discussion of treatment and procedures well. All questions were answered thoroughly reviewed. Debridement of mycotic and hypertrophic toenails, 1 through 5 bilateral and clearing of subungual debris. No ulceration, no infection noted.    Return Visit-Office Procedure: Patient instructed to return to the office for a follow up visit 10 weeks for continued evaluation and treatment.    Gardiner Barefoot DPM

## 2018-10-29 ENCOUNTER — Telehealth: Payer: Self-pay

## 2018-10-30 ENCOUNTER — Other Ambulatory Visit: Payer: Self-pay | Admitting: Internal Medicine

## 2018-11-04 ENCOUNTER — Telehealth: Payer: Self-pay

## 2018-11-04 ENCOUNTER — Ambulatory Visit: Payer: Self-pay

## 2018-11-04 DIAGNOSIS — N183 Chronic kidney disease, stage 3 unspecified: Secondary | ICD-10-CM

## 2018-11-04 DIAGNOSIS — M2550 Pain in unspecified joint: Secondary | ICD-10-CM

## 2018-11-04 DIAGNOSIS — I10 Essential (primary) hypertension: Secondary | ICD-10-CM

## 2018-11-04 DIAGNOSIS — E1122 Type 2 diabetes mellitus with diabetic chronic kidney disease: Secondary | ICD-10-CM

## 2018-11-04 NOTE — Chronic Care Management (AMB) (Signed)
  Chronic Care Management   Outreach Note  11/04/2018 Name: Helen Richardson MRN: [id number] DOB: Jun 03, 1933  Referred by: Glendale Chard, MD Reason for referral : Chronic Care Management (CCM RN CM Telephone Follow up )   An unsuccessful telephone outreach was attempted today. The patient was referred to the case management team by Glendale Chard MD for assistance with chronic care management and care coordination.   Follow Up Plan: A HIPPA compliant phone message was left for the patient on her home phone # providing contact information and requesting a return call. Telephone follow up appointment with care management team member scheduled for: 11/22/18   Barb Merino, RN, BSN, CCM Care Management Coordinator [facility]  Direct Phone: [phone]

## 2018-11-11 ENCOUNTER — Telehealth: Payer: Self-pay

## 2018-11-22 ENCOUNTER — Telehealth: Payer: Self-pay

## 2018-11-22 ENCOUNTER — Other Ambulatory Visit: Payer: Self-pay

## 2018-11-22 MED ORDER — POTASSIUM CHLORIDE CRYS ER 20 MEQ PO TBCR: EXTENDED_RELEASE_TABLET | ORAL | 0 refills | Status: DC

## 2018-11-22 MED ORDER — FUROSEMIDE 40 MG PO TABS: 40.0000 mg | ORAL_TABLET | Freq: Every day | ORAL | 1 refills | Status: DC

## 2018-11-22 MED ORDER — TELMISARTAN 80 MG PO TABS: 80.0000 mg | ORAL_TABLET | Freq: Every day | ORAL | 1 refills | Status: DC

## 2018-11-23 NOTE — Progress Notes (Signed)
This encounter was created in error - please disregard.

## 2018-11-24 ENCOUNTER — Ambulatory Visit: Payer: Medicare Other

## 2018-11-24 ENCOUNTER — Ambulatory Visit: Payer: Medicare Other | Admitting: Internal Medicine

## 2018-11-25 ENCOUNTER — Ambulatory Visit (INDEPENDENT_AMBULATORY_CARE_PROVIDER_SITE_OTHER): Payer: Medicare Other

## 2018-11-25 ENCOUNTER — Other Ambulatory Visit: Payer: Self-pay

## 2018-11-25 ENCOUNTER — Telehealth: Payer: Medicare Other

## 2018-11-25 DIAGNOSIS — N183 Chronic kidney disease, stage 3 unspecified: Secondary | ICD-10-CM

## 2018-11-25 DIAGNOSIS — E1122 Type 2 diabetes mellitus with diabetic chronic kidney disease: Secondary | ICD-10-CM

## 2018-11-25 DIAGNOSIS — M2550 Pain in unspecified joint: Secondary | ICD-10-CM

## 2018-11-25 DIAGNOSIS — Z794 Long term (current) use of insulin: Secondary | ICD-10-CM

## 2018-11-25 DIAGNOSIS — I10 Essential (primary) hypertension: Secondary | ICD-10-CM | POA: Diagnosis not present

## 2018-11-26 ENCOUNTER — Ambulatory Visit: Payer: Self-pay

## 2018-11-26 DIAGNOSIS — E1122 Type 2 diabetes mellitus with diabetic chronic kidney disease: Secondary | ICD-10-CM

## 2018-11-26 DIAGNOSIS — M2550 Pain in unspecified joint: Secondary | ICD-10-CM

## 2018-11-26 DIAGNOSIS — N183 Chronic kidney disease, stage 3 unspecified: Secondary | ICD-10-CM

## 2018-11-26 DIAGNOSIS — I10 Essential (primary) hypertension: Secondary | ICD-10-CM

## 2018-11-26 DIAGNOSIS — Z794 Long term (current) use of insulin: Secondary | ICD-10-CM

## 2018-11-26 NOTE — Patient Instructions (Addendum)
Visit Information  Goals Addressed    . "I think my grandmother has CHF"       Grandson stated:   Current Barriers:  [name] Kitchen Knowledge Deficits related to cause for lower leg edema and recent fall   Nurse Case Manager Clinical Goal(s):  [name] Kitchen Over the next 90 days, patient and grandson Lamya Lausch will work with the CCM team to address care coordination needs and disease specific education needs for health related comorbidies and how to self manage. 07/15/18-Target goal date changed to 90 days Goal Met . 11/25/18 Over the next 90 days patient report increased adherence with Self monitoring her weights daily and recording the weights for the CCM team and PCP to review . 11/25/18 Over the next 90 days, patient will experience no ED events and or IP admissions secondary to CHF exacerbation   CCM RN CM Interventions:  11/25/18 completed call with patient   . Evaluation of current treatment plan related to CHF  and patient's adherence to plan as established by provider. . Provided education to patient re: potential complications of CHF if not detected and treated early including chest pain, increased edema, shortness of breath, renal failure, respiratory failure, hospitalization and or death; Assessed for s/s suggestive of CHF - patient denies having edema, shortness of breath, chest pain or nausea; patient is only weighing 3 times weekly w/assistance of her CNA; patient is unable to recall her most recent weight taken; patient reports her CNA informed her she has gained 5 lbs in one week; Assessed for hydration and appetite; discussed patient is trying to drink more water and is eating 3 meals per day; discussed meals are home cooked and prepared by her CNA and other family members . Reviewed medications with patient and discussed patient is adhering to taking her Lasix 40 mg daily . Collaborated with Dr. Baird Cancer regarding patient reports her CNA advised her she has gained 5 lbs in 1 week . Discussed plans  with patient for ongoing care management follow up and provided patient with direct contact information for care management team . Provided patient with printed educational materials related to CHF daily weight log . Advised patient, providing education and rationale, to weigh daily and record, calling PCP office and or CCM team for weight gain of 3lbs overnight or 5 pounds in a week.  Discussed patient missed her AWV with Dr. Baird Cancer this week; patient states she had the date wrong; discussed the office will call her to reschedule   11/26/18 call completed with patient   . Assessed for s/s of CHF exacerbation - patient continues to deny symptoms . Assessed for daily weight; patient and aide report patient's weight today is 175 lbs; aide confirms Ms. Dehner daily weights are ranging from 173-175 lbs without a significant change . Confirmed patient has the CHF zone management tool for reference . Reinforced the importance of patient monitoring her weights daily each am before breakfast and after voiding and recording her weights . Reviewed other s/s suggestive of CHF and discussed when to call patient's PCP and or CCM team  . Sent in basket message to Dr. Baird Cancer notifying her of Ms. Jaime weight today   Patient Self Care Activities:   Patient is currently UNABLE TO independently provide self-care or self administer medications  Please see past updates related to this goal by clicking on the "Past Updates" button in the selected goal          The patient verbalized understanding of instructions provided  today and declined a print copy of patient instruction materials.   Telephone follow up appointment with care management team member scheduled for: 12/16/18  Barb Merino, RN, BSN, CCM Care Management Coordinator [place] [facility]  Direct Phone: [phone]

## 2018-11-26 NOTE — Patient Instructions (Signed)
Visit Information  Goals Addressed      Patient Stated   . I don't know if insulin regimen is working (pt-stated)       Current Barriers:  [name] Kitchen Knowledge Deficits related to managing diabetes  Pharmacist Clinical Goal(s):  [name] Kitchen Over the next 90 days, patient will demonstrate Improved medication adherence as evidenced by BGs within range, FBG<130, no hypoglycemia . Over the next 90 days, patient will work with CCM team to address needs related to managing diabetes 08/03/18: re-established target goal date to 15 days due to treatment delays secondary to COVID-19  CCM RN CM Interventions: 11/25/18 call completed with patient    Assessed for medication adherence; discussed patient's Tresiba dosage and frequency - Ms. Gosdin states she is self administering this medication every am without difficulty  Discussed CBG's this past week (pt runs lower in the am, lowest BG 79, average in the 170's with the highest 200)  Reviewed with patient how to handle hypo/hyperglycemia events and when to call the doctor if unable to resolve her symptoms and or improve her BG levels  Discussed patient continues to check her CBG every am before meals and sometimes will recheck her CBG if not feeling well or if experiencing hypoglycemia at anytime during the daytime  Positive reinforcement provided to patient for adhering to her DM plan of care and closely monitoring her glucose levels   Discussed plans with patient for ongoing care management follow up and provided patient with direct contact information for care management team  Patient Self Care Activities:  . Currently UNABLE TO independently manage diabetes; chronic conditions  Please see past updates related to this goal by clicking on the "Past Updates" button in the selected goal        Other   . COMPLETED: "Her pain is so bad from her arthritis"       Current Barriers:  [name] Kitchen Knowledge Deficits related to pain management for Arthritis  Nurse Case  Manager Clinical Goal(s):  [name] Kitchen Over the next 60 days, patient will demonstrate a decrease in Pain exacerbations as evidenced by patient will exhibit increased and improved physical mobility and will be able to particpate in PT . 08/13/18: Target goal date re-established to 60 days due to services were put on hold but due to resume on 08/16/18   Goal Met  CCM RN CM Interventions:  11/25/18 call completed with patient   . Evaluation of current treatment plan related to pain management for arthritis and patient's adherence to plan as established by provider . Discussed patient states her arthritic pain has significantly decreased; she is currently able to ambulate within her home without difficulty and denies having any falls; she is sleeping better; continues to wear an emergency alert necklace . Discussed patient continues to have an aide coming into her home daily from around 8 am to 12 noon; she continues to have the support of her grandson Erlene Quan and other family member's who assist with providing meals . Discussed patient is now satisfied with the management of her Arthritis   Patient Self Care Activities:   Patient is currently UNABLE TO independently provide self-care or self administer medications . Attends all scheduled provider appointments (grandson and family are providing transportation) . Grandson or caregiver [place] calls pharmacy for medication refills . Needs assistance with ADL's independently . Needs assistance with IADL's independently . [name] calls provider office for new concerns or questions  Please see past updates related to this goal by clicking on  the "Past Updates" button in the selected goal     . "I think my grandmother has CHF"       Grandson stated:   Current Barriers:  [name] Kitchen Knowledge Deficits related to cause for lower leg edema and recent fall   Nurse Case Manager Clinical Goal(s):  [name] Kitchen Over the next 90 days, patient and grandson Tinisha Etzkorn will  work with the CCM team to address care coordination needs and disease specific education needs for health related comorbidies and how to self manage. 07/15/18-Target goal date changed to 90 days Goal Met . 11/25/18 Over the next 90 days patient report increased adherence with Self monitoring her weights daily and recording the weights for the CCM team and PCP to review . 11/25/18 Over the next 90 days, patient will experience no ED events and or IP admissions secondary to CHF exacerbation   CCM RN CM Interventions:  11/25/18 completed call with patient   . Evaluation of current treatment plan related to CHF  and patient's adherence to plan as established by provider. . Provided education to patient re: potential complications of CHF if not detected and treated early including chest pain, increased edema, shortness of breath, renal failure, respiratory failure, hospitalization and or death; Assessed for s/s suggestive of CHF - patient denies having edema, shortness of breath, chest pain or nausea; patient is only weighing 3 times weekly w/assistance of her CNA; patient is unable to recall her most recent weight taken; patient reports her CNA informed her she has gained 5 lbs in one week; Assessed for hydration and appetite; discussed patient is trying to drink more water and is eating 3 meals per day; discussed meals are home cooked and prepared by her CNA and other family members . Reviewed medications with patient and discussed patient is adhering to taking her Lasix 40 mg daily . Collaborated with Dr. Baird Cancer regarding patient reports her CNA advised her she has gained 5 lbs in 1 week . Discussed plans with patient for ongoing care management follow up and provided patient with direct contact information for care management team . Provided patient with printed educational materials related to CHF daily weight log . Advised patient, providing education and rationale, to weigh daily and record, calling  PCP office and or CCM team for weight gain of 3lbs overnight or 5 pounds in a week.  Discussed patient missed her AWV with Dr. Baird Cancer this week; patient states she had the date wrong; discussed the office will call her to reschedule  Patient Self Care Activities:   Patient is currently UNABLE TO independently provide self-care or self administer medications  Please see past updates related to this goal by clicking on the "Past Updates" button in the selected goal        The patient verbalized understanding of instructions provided today and declined a print copy of patient instruction materials.   Telephone follow up appointment with care management team member scheduled for: 11/26/18  Barb Merino, RN, BSN, CCM Care Management Coordinator [facility]  Direct Phone: [phone]

## 2018-11-26 NOTE — Chronic Care Management (AMB) (Signed)
Chronic Care Management   Follow Up Note   11/25/2018 Name: QUEEN ABBETT MRN: [id number] DOB: [birth year]  Referred by: Glendale Chard, MD Reason for referral : Chronic Care Management (CCM RNCM Telephone Follow up )   EMMALENE KATTNER is a 85 y.o. year old female who is a primary care patient of Glendale Chard, MD. The CCM team was consulted for assistance with chronic disease management and care coordination needs.    Review of patient status, including review of consultants reports, relevant laboratory and other test results, and collaboration with appropriate care team members and the patient's provider was performed as part of comprehensive patient evaluation and provision of chronic care management services.    SDOH (Social Determinants of Health) screening performed today: None. See Care Plan for related entries.   Advanced Directives Status: N See Care Plan and Vynca application for related entries.  I spoke with Ms. Sheehan by telephone today for a CCM follow up.   Outpatient Encounter Medications as of 11/25/2018  Medication Sig Note   ADVOCATE LANCETS MISC  03/10/2013: Received from: External Pharmacy   Alcohol Swabs (ALCOHOL PREP) 70 % PADS  03/10/2013: Received from: External Pharmacy   aspirin 81 MG chewable tablet Chew 81 mg by mouth at bedtime.    Calcium Carbonate-Vitamin D (CALTRATE 600+D PO) Take 1 tablet by mouth daily.    carvedilol (COREG) 6.25 MG tablet TAKE 1 TABLET BY MOUTH TWICE DAILY    COMFORT EZ PEN NEEDLES 32G X 4 MM MISC     diclofenac sodium (VOLTAREN) 1 % GEL APPLY TO AFFECTED AREA 3 TIMES DAILY AS NEEDED (Patient taking differently: Apply 2 g topically 3 (three) times daily as needed (pain). )    furosemide (LASIX) 40 MG tablet Take 1 tablet (40 mg total) by mouth daily.    glucose blood test strip Test up to 4 times daily    Lancet Devices (ADVOCATE LANCING DEVICE) MISC  03/10/2013: Received from: External Pharmacy   potassium chloride SA (K-DUR)  20 MEQ tablet potassium chloride ER 20 mEq tablet,extended release(part/cryst)    rosuvastatin (CRESTOR) 20 MG tablet TAKE 1 TABLET BY MOUTH EVERY DAY    telmisartan (MICARDIS) 80 MG tablet Take 1 tablet (80 mg total) by mouth daily.    TRADJENTA 5 MG TABS tablet TK 1 T PO QD    traMADol (ULTRAM) 50 MG tablet Take 1 tablet (50 mg total) by mouth every 6 (six) hours as needed.    TRESIBA FLEXTOUCH 200 UNIT/ML SOPN ADMINISTER 34 UNITS UNDER THE SKIN EVERY EVENING. MAX TITRATION- 80 UNITS    No facility-administered encounter medications on file as of 11/25/2018.      Goals Addressed      Patient Stated    I don't know if insulin regimen is working (pt-stated)       Current Barriers:   Knowledge Deficits related to managing diabetes  Pharmacist Clinical Goal(s):   Over the next 90 days, patient will demonstrate Improved medication adherence as evidenced by BGs within range, FBG<130, no hypoglycemia  Over the next 90 days, patient will work with CCM team to address needs related to managing diabetes 08/03/18: re-established target goal date to 90 days due to treatment delays secondary to COVID-19  CCM RN CM Interventions: 11/25/18 call completed with patient    Assessed for medication adherence; discussed patient's Tresiba dosage and frequency - Ms. Zupko states she is self administering this medication every am without difficulty  Discussed CBG's this past week (  pt runs lower in the am, lowest BG 79, average in the 170's with the highest 200)  Reviewed with patient how to handle hypo/hyperglycemia events and when to call the doctor if unable to resolve her symptoms and or improve her BG levels  Discussed patient continues to check her CBG every am before meals and sometimes will recheck her CBG if not feeling well or if experiencing hypoglycemia at anytime during the daytime  Positive reinforcement provided to patient for adhering to her DM plan of care and closely monitoring  her glucose levels   Discussed plans with patient for ongoing care management follow up and provided patient with direct contact information for care management team  Patient Self Care Activities:   Currently UNABLE TO independently manage diabetes; chronic conditions  Please see past updates related to this goal by clicking on the "Past Updates" button in the selected goal        Other    COMPLETED: "Her pain is so bad from her arthritis"       Current Barriers:   Knowledge Deficits related to pain management for Arthritis  Nurse Case Manager Clinical Goal(s):   Over the next 60 days, patient will demonstrate a decrease in Pain exacerbations as evidenced by patient will exhibit increased and improved physical mobility and will be able to particpate in PT  08/13/18: Target goal date re-established to 60 days due to services were put on hold but due to resume on 08/16/18   Goal Met  CCM RN CM Interventions:  11/25/18 call completed with patient    Evaluation of current treatment plan related to pain management for arthritis and patient's adherence to plan as established by provider  Discussed patient states her arthritic pain has significantly decreased; she is currently able to ambulate within her home without difficulty and denies having any falls; she is sleeping better; continues to wear an emergency alert necklace  Discussed patient continues to have an aide coming into her home daily from around 8 am to 12 noon; she continues to have the support of her grandson Erlene Quan and other family member's who assist with providing meals  Discussed patient is now satisfied with the management of her Arthritis   Patient Self Care Activities:   Patient is currently UNABLE TO independently provide self-care or self administer medications  Attends all scheduled provider appointments (grandson and family are providing transportation)  Games developer or caregiver [place] calls pharmacy for  medication refills  Needs assistance with ADL's independently  Needs assistance with IADL's independently  [name] calls provider office for new concerns or questions   Please see past updates related to this goal by clicking on the "Past Updates" button in the selected goal        "I think my grandmother has CHF"       Grandson stated:   Current Barriers:   Knowledge Deficits related to cause for lower leg edema and recent fall   Nurse Case Manager Clinical Goal(s):   Over the next 90 days, patient and grandson Domingue Coltrain will work with the CCM team to address care coordination needs and disease specific education needs for health related comorbidies and how to self manage. 07/15/18-Target goal date changed to 90 days Goal Met  11/25/18 Over the next 90 days patient report increased adherence with Self monitoring her weights daily and recording the weights for the CCM team and PCP to review  11/25/18 Over the next 90 days, patient will experience no ED  events and or IP admissions secondary to CHF exacerbation   CCM RN CM Interventions:  11/25/18 completed call with patient    Evaluation of current treatment plan related to CHF  and patient's adherence to plan as established by provider.  Provided education to patient re: potential complications of CHF if not detected and treated early including chest pain, increased edema, shortness of breath, renal failure, respiratory failure, hospitalization and or death; Assessed for s/s suggestive of CHF - patient denies having edema, shortness of breath, chest pain or nausea; patient is only weighing 3 times weekly w/assistance of her CNA; patient is unable to recall her most recent weight taken; patient reports her CNA informed her she has gained 5 lbs in one week; Assessed for hydration and appetite; discussed patient is trying to drink more water and is eating 3 meals per day; discussed meals are home cooked and prepared by  her CNA and other family members  Reviewed medications with patient and discussed patient is adhering to taking her Lasix 40 mg daily  Collaborated with Dr. Baird Cancer regarding patient reports her CNA advised her she has gained 5 lbs in 1 week  Discussed plans with patient for ongoing care management follow up and provided patient with direct contact information for care management team  Provided patient with printed educational materials related to CHF daily weight log  Advised patient, providing education and rationale, to weigh daily and record, calling PCP office and or CCM team for weight gain of 3lbs overnight or 5 pounds in a week.  Discussed patient missed her AWV with Dr. Baird Cancer this week; patient states she had the date wrong; discussed the office will call her to reschedule   Patient Self Care Activities:   Patient is currently UNABLE TO independently provide self-care or self administer medications   Please see past updates related to this goal by clicking on the "Past Updates" button in the selected goal           Telephone follow up appointment with care management team member scheduled for: 11/26/18   Barb Merino, RN, BSN, CCM Care Management Coordinator [place] [facility]  Direct Phone: [phone]

## 2018-11-26 NOTE — Chronic Care Management (AMB) (Signed)
Chronic Care Management   Follow Up Note   11/26/2018 Name: Helen Richardson MRN: [id number] DOB: May 14, 1933  Referred by: Glendale Chard, MD Reason for referral : Chronic Care Management (CCM RNCM Telephone Follow up )   Helen Richardson is a 85 y.o. year old female who is a primary care patient of Glendale Chard, MD. The CCM team was consulted for assistance with chronic disease management and care coordination needs.    Review of patient status, including review of consultants reports, relevant laboratory and other test results, and collaboration with appropriate care team members and the patient's provider was performed as part of comprehensive patient evaluation and provision of chronic care management services.    SDOH (Social Determinants of Health) screening performed today: None. See Care Plan for related entries.   Advanced Directives Status: N See Care Plan and Vynca application for related entries.  I spoke with Helen Richardson by telephone today to f/u on her CHF.   Outpatient Encounter Medications as of 11/26/2018  Medication Sig Note  . ADVOCATE LANCETS MISC  03/10/2013: Received from: External Pharmacy  . Alcohol Swabs (ALCOHOL PREP) 70 % PADS  03/10/2013: Received from: External Pharmacy  . aspirin 81 MG chewable tablet Chew 81 mg by mouth at bedtime.   . Calcium Carbonate-Vitamin D (CALTRATE 600+D PO) Take 1 tablet by mouth daily.   . carvedilol (COREG) 6.25 MG tablet TAKE 1 TABLET BY MOUTH TWICE DAILY   . COMFORT EZ PEN NEEDLES 32G X 4 MM MISC    . diclofenac sodium (VOLTAREN) 1 % GEL APPLY TO AFFECTED AREA 3 TIMES DAILY AS NEEDED (Patient taking differently: Apply 2 g topically 3 (three) times daily as needed (pain). )   . furosemide (LASIX) 40 MG tablet Take 1 tablet (40 mg total) by mouth daily.   [name] Kitchen glucose blood test strip Test up to 4 times daily   . Lancet Devices (ADVOCATE LANCING DEVICE) MISC  03/10/2013: Received from: External Pharmacy  . potassium chloride SA (K-DUR)  20 MEQ tablet potassium chloride ER 20 mEq tablet,extended release(part/cryst)   . rosuvastatin (CRESTOR) 20 MG tablet TAKE 1 TABLET BY MOUTH EVERY DAY   . telmisartan (MICARDIS) 80 MG tablet Take 1 tablet (80 mg total) by mouth daily.   . TRADJENTA 5 MG TABS tablet TK 1 T PO QD   . traMADol (ULTRAM) 50 MG tablet Take 1 tablet (50 mg total) by mouth every 6 (six) hours as needed.   . TRESIBA FLEXTOUCH 200 UNIT/ML SOPN ADMINISTER 34 UNITS UNDER THE SKIN EVERY EVENING. MAX TITRATION- 80 UNITS    No facility-administered encounter medications on file as of 11/26/2018.      Goals Addressed    . "I think my grandmother has CHF"       Grandson stated:   Current Barriers:  [name] Kitchen Knowledge Deficits related to cause for lower leg edema and recent fall   Nurse Case Manager Clinical Goal(s):  [name] Kitchen Over the next 90 days, patient and grandson Tyrea Froberg will work with the CCM team to address care coordination needs and disease specific education needs for health related comorbidies and how to self manage. 07/15/18-Target goal date changed to 90 days Goal Met . 11/25/18 Over the next 90 days patient report increased adherence with Self monitoring her weights daily and recording the weights for the CCM team and PCP to review . 11/25/18 Over the next 90 days, patient will experience no ED events and or IP admissions secondary to CHF  exacerbation   CCM RN CM Interventions:  11/25/18 completed call with patient   . Evaluation of current treatment plan related to CHF  and patient's adherence to plan as established by provider. . Provided education to patient re: potential complications of CHF if not detected and treated early including chest pain, increased edema, shortness of breath, renal failure, respiratory failure, hospitalization and or death; Assessed for s/s suggestive of CHF - patient denies having edema, shortness of breath, chest pain or nausea; patient is only weighing 3 times weekly w/assistance of  her CNA; patient is unable to recall her most recent weight taken; patient reports her CNA informed her she has gained 5 lbs in one week; Assessed for hydration and appetite; discussed patient is trying to drink more water and is eating 3 meals per day; discussed meals are home cooked and prepared by her CNA and other family members . Reviewed medications with patient and discussed patient is adhering to taking her Lasix 40 mg daily . Collaborated with Dr. Baird Cancer regarding patient reports her CNA advised her she has gained 5 lbs in 1 week . Discussed plans with patient for ongoing care management follow up and provided patient with direct contact information for care management team . Provided patient with printed educational materials related to CHF daily weight log . Advised patient, providing education and rationale, to weigh daily and record, calling PCP office and or CCM team for weight gain of 3lbs overnight or 5 pounds in a week.  Discussed patient missed her AWV with Dr. Baird Cancer this week; patient states she had the date wrong; discussed the office will call her to reschedule   11/26/18 call completed with patient   . Assessed for s/s of CHF exacerbation - patient continues to deny symptoms . Assessed for daily weight; patient and aide report patient's weight today is 175 lbs; aide confirms Helen Richardson daily weights are ranging from 173-175 lbs without a significant change . Confirmed patient has the CHF zone management tool for reference . Reinforced the importance of patient monitoring her weights daily each am before breakfast and after voiding and recording her weights . Reviewed other s/s suggestive of CHF and discussed when to call patient's PCP and or CCM team  . Sent in basket message to Dr. Baird Cancer notifying her of Helen Richardson weight today   Patient Self Care Activities:   Patient is currently UNABLE TO independently provide self-care or self administer medications  Please see  past updates related to this goal by clicking on the "Past Updates" button in the selected goal         Telephone follow up appointment with care management team member scheduled for: 12/16/18   Barb Merino, RN, BSN, CCM Care Management Coordinator [place] [facility]  Direct Phone: [phone]

## 2018-12-02 ENCOUNTER — Telehealth: Payer: Self-pay

## 2018-12-14 ENCOUNTER — Other Ambulatory Visit: Payer: Self-pay

## 2018-12-14 ENCOUNTER — Telehealth: Payer: Self-pay

## 2018-12-14 ENCOUNTER — Ambulatory Visit (INDEPENDENT_AMBULATORY_CARE_PROVIDER_SITE_OTHER): Payer: Medicare Other | Admitting: Internal Medicine

## 2018-12-14 ENCOUNTER — Encounter: Payer: Self-pay | Admitting: Internal Medicine

## 2018-12-14 VITALS — BP 140/72 | HR 97 | Temp 98.0°F | Ht 59.8 in | Wt 182.6 lb

## 2018-12-14 DIAGNOSIS — Z6835 Body mass index (BMI) 35.0-35.9, adult: Secondary | ICD-10-CM

## 2018-12-14 DIAGNOSIS — R413 Other amnesia: Secondary | ICD-10-CM

## 2018-12-14 DIAGNOSIS — Z794 Long term (current) use of insulin: Secondary | ICD-10-CM

## 2018-12-14 DIAGNOSIS — E1122 Type 2 diabetes mellitus with diabetic chronic kidney disease: Secondary | ICD-10-CM | POA: Diagnosis not present

## 2018-12-14 DIAGNOSIS — I129 Hypertensive chronic kidney disease with stage 1 through stage 4 chronic kidney disease, or unspecified chronic kidney disease: Secondary | ICD-10-CM

## 2018-12-14 DIAGNOSIS — N183 Chronic kidney disease, stage 3 unspecified: Secondary | ICD-10-CM | POA: Diagnosis not present

## 2018-12-14 DIAGNOSIS — Z23 Encounter for immunization: Secondary | ICD-10-CM

## 2018-12-14 NOTE — Patient Instructions (Addendum)
Exercises To Do While Sitting  Exercises that you do while sitting (chair exercises) can give you many of the same benefits as full exercise. Benefits include strengthening your heart, burning calories, and keeping muscles and joints healthy. Exercise can also improve your mood and help with depression and anxiety. You may benefit from chair exercises if you are unable to do standing exercises because of:  Diabetic foot pain.  Obesity.  Illness.  Arthritis.  Recovery from surgery or injury.  Breathing problems.  Balance problems.  Another type of disability. Before starting chair exercises, check with your health care provider or a physical therapist to find out how much exercise you can tolerate and which exercises are safe for you. If your health care provider approves:  Start out slowly and build up over time. Aim to work up to about 10-20 minutes for each exercise session.  Make exercise part of your daily routine.  Drink water when you exercise. Do not wait until you are thirsty. Drink every 10-15 minutes.  Stop exercising right away if you have pain, nausea, shortness of breath, or dizziness.  If you are exercising in a wheelchair, make sure to lock the wheels.  Ask your health care provider whether you can do tai chi or yoga. Many positions in these mind-body exercises can be modified to do while seated. Warm-up Before starting other exercises: 1. Sit up as straight as you can. Have your knees bent at 90 degrees, which is the shape of the capital letter "L." Keep your feet flat on the floor. 2. Sit at the front edge of your chair, if you can. 3. Pull in (tighten) the muscles in your abdomen and stretch your spine and neck as straight as you can. Hold this position for a few minutes. 4. Breathe in and out evenly. Try to concentrate on your breathing, and relax your mind. Stretching Exercise A: Arm stretch 1. Hold your arms out straight in front of your body. 2. Bend  your hands at the wrist with your fingers pointing up, as if signaling someone to stop. Notice the slight tension in your forearms as you hold the position. 3. Keeping your arms out and your hands bent, rotate your hands outward as far as you can and hold this stretch. Aim to have your thumbs pointing up and your pinkie fingers pointing down. Slowly repeat arm stretches for one minute as tolerated. Exercise B: Leg stretch 1. If you can move your legs, try to "draw" letters on the floor with the toes of your foot. Write your name with one foot. 2. Write your name with the toes of your other foot. Slowly repeat the movements for one minute as tolerated. Exercise C: Reach for the sky 1. Reach your hands as far over your head as you can to stretch your spine. 2. Move your hands and arms as if you are climbing a rope. Slowly repeat the movements for one minute as tolerated. Range of motion exercises Exercise A: Shoulder roll 1. Let your arms hang loosely at your sides. 2. Lift just your shoulders up toward your ears, then let them relax back down. 3. When your shoulders feel loose, rotate your shoulders in backward and forward circles. Do shoulder rolls slowly for one minute as tolerated. Exercise B: March in place 1. As if you are marching, pump your arms and lift your legs up and down. Lift your knees as high as you can. ? If you are unable to lift your knees,   just pump your arms and move your ankles and feet up and down. March in place for one minute as tolerated. Exercise C: Seated jumping jacks 1. Let your arms hang down straight. 2. Keeping your arms straight, lift them up over your head. Aim to point your fingers to the ceiling. 3. While you lift your arms, straighten your legs and slide your heels along the floor to your sides, as wide as you can. 4. As you bring your arms back down to your sides, slide your legs back together. ? If you are unable to use your legs, just move your arms.  Slowly repeat seated jumping jacks for one minute as tolerated. Strengthening exercises Exercise A: Shoulder squeeze 1. Hold your arms straight out from your body to your sides, with your elbows bent and your fists pointed at the ceiling. 2. Keeping your arms in the bent position, move them forward so your elbows and forearms meet in front of your face. 3. Open your arms back out as wide as you can with your elbows still bent, until you feel your shoulder blades squeezing together. Hold for 5 seconds. Slowly repeat the movements forward and backward for one minute as tolerated. Contact a health care provider if you:  Had to stop exercising due to any of the following: ? Pain. ? Nausea. ? Shortness of breath. ? Dizziness. ? Fatigue.  Have significant pain or soreness after exercising. Get help right away if you have:  Chest pain.  Difficulty breathing. These symptoms may represent a serious problem that is an emergency. Do not wait to see if the symptoms will go away. Get medical help right away. Call your local emergency services (911 in the [place] Do not drive yourself to the hospital. This information is not intended to replace advice given to you by your health care provider. Make sure you discuss any questions you have with your health care provider. Document Released: 12/31/2016 Document Revised: 06/10/2018 Document Reviewed: 12/31/2016 Elsevier Patient Education  2020 [employer].  

## 2018-12-16 ENCOUNTER — Telehealth: Payer: Medicare Other

## 2018-12-21 ENCOUNTER — Telehealth: Payer: Self-pay

## 2018-12-22 LAB — CMP14+EGFR
ALT: 9 IU/L (ref 0–32)
AST: 12 IU/L (ref 0–40)
Albumin/Globulin Ratio: 1.3 (ref 1.2–2.2)
Albumin: 3.6 g/dL (ref 3.6–4.6)
Alkaline Phosphatase: 71 IU/L (ref 39–117)
BUN/Creatinine Ratio: 17 (ref 12–28)
BUN: 13 mg/dL (ref 8–27)
Bilirubin Total: 0.2 mg/dL (ref 0.0–1.2)
CO2: 23 mmol/L (ref 20–29)
Calcium: 9.1 mg/dL (ref 8.7–10.3)
Chloride: 100 mmol/L (ref 96–106)
Creatinine, Ser: 0.76 mg/dL (ref 0.57–1.00)
GFR calc Af Amer: 84 mL/min/{1.73_m2} (ref >59)
GFR calc non Af Amer: 73 mL/min/{1.73_m2} (ref >59)
Globulin, Total: 2.7 g/dL (ref 1.5–4.5)
Glucose: 234 mg/dL — ABNORMAL HIGH (ref 65–99)
Potassium: 4.8 mmol/L (ref 3.5–5.2)
Sodium: 136 mmol/L (ref 134–144)
Total Protein: 6.3 g/dL (ref 6.0–8.5)

## 2018-12-22 LAB — HEMOGLOBIN A1C
Est. average glucose Bld gHb Est-mCnc: 232 mg/dL
Hgb A1c MFr Bld: 9.7 % — ABNORMAL HIGH (ref 4.8–5.6)

## 2018-12-22 LAB — TSH: TSH: 1.09 u[IU]/mL (ref 0.450–4.500)

## 2018-12-22 LAB — LIPID PANEL
Chol/HDL Ratio: 2.4 ratio (ref 0.0–4.4)
Cholesterol, Total: 153 mg/dL (ref 100–199)
HDL: 65 mg/dL (ref >39)
LDL Chol Calc (NIH): 70 mg/dL (ref 0–99)
Triglycerides: 97 mg/dL (ref 0–149)
VLDL Cholesterol Cal: 18 mg/dL (ref 5–40)

## 2018-12-22 LAB — RPR: RPR Ser Ql: NONREACTIVE

## 2018-12-23 ENCOUNTER — Ambulatory Visit: Payer: Medicare Other | Admitting: Pharmacist

## 2018-12-23 ENCOUNTER — Telehealth: Payer: Self-pay | Admitting: Internal Medicine

## 2018-12-23 DIAGNOSIS — I10 Essential (primary) hypertension: Secondary | ICD-10-CM

## 2018-12-23 DIAGNOSIS — E1122 Type 2 diabetes mellitus with diabetic chronic kidney disease: Secondary | ICD-10-CM

## 2018-12-23 DIAGNOSIS — N183 Chronic kidney disease, stage 3 unspecified: Secondary | ICD-10-CM

## 2018-12-23 NOTE — Telephone Encounter (Signed)
I called the patient to schedule AWV with [place].  She said that she isn't doing too well right now and has transportation issues with getting to office, so I offered a virtual visit.  She doesn't have a smartphone or internet, so I offered a phone call.  I explained that her AWV is past due and tried to get it scheduled for next week, but she insisted on scheduling it in late December after Christmas.

## 2018-12-26 NOTE — Progress Notes (Signed)
  Chronic Care Management   Outreach Note  12/23/2018 Name: Helen Richardson MRN: [id number] DOB: 1933-10-15  Referred by: Glendale Chard, MD Reason for referral : Chronic Care Management   Successful outreach to Helen Richardson with HIPAA identifiers verified.  Patient was with her home health aid and was busy taking her morning medications.  She requested I call back next week. Patient assistance packet for insulin (NovoNordisk-Tresiba) mailed to Helen Richardson per her request.  Will follow-up and assist.  Of note, Helen Richardson' A1c has improved while being on Tresiba.   Follow Up Plan: Telephone follow up appointment with care management team member scheduled for: 12/30/2018   Regina Eck, PharmD, BCPS Clinical Pharmacist, [facility] [place]: [phone]

## 2018-12-26 NOTE — Progress Notes (Signed)
Subjective:     Patient ID: Helen Richardson , female    DOB: 10/11/1933 , 85 y.o.   MRN: [id number]   Chief Complaint  Patient presents with  . Diabetes  . Hypertension    HPI  Diabetes She presents for her follow-up diabetic visit. She has type 2 diabetes mellitus. There are no hypoglycemic associated symptoms. Pertinent negatives for diabetes include no blurred vision and no chest pain. There are no hypoglycemic complications. She is following a generally healthy diet. Meal planning includes avoidance of concentrated sweets. She never participates in exercise. An ACE inhibitor/angiotensin II receptor blocker is being taken.  Hypertension This is a chronic problem. The current episode started today. The problem has been gradually improving since onset. The problem is uncontrolled. Pertinent negatives include no blurred vision, chest pain, palpitations or shortness of breath. Risk factors for coronary artery disease include diabetes mellitus, dyslipidemia, obesity, post-menopausal state and sedentary lifestyle. The current treatment provides moderate improvement. Compliance problems include exercise.      Past Medical History:  Diagnosis Date  . CHF (congestive heart failure) ([place])   . Diabetes mellitus without complication ([place])   . High cholesterol   . HTN (hypertension)   . Obesity      Family History  Problem Relation Age of Onset  . Diabetes Mother   . Hypertension Mother   . Hypertension Father   . Stroke Father      Current Outpatient Medications:  .  ADVOCATE LANCETS MISC, , Disp: , Rfl:  .  Alcohol Swabs (ALCOHOL PREP) 70 % PADS, , Disp: , Rfl:  .  aspirin 81 MG chewable tablet, Chew 81 mg by mouth at bedtime., Disp: , Rfl:  .  Calcium Carbonate-Vitamin D (CALTRATE 600+D PO), Take 1 tablet by mouth daily., Disp: , Rfl:  .  carvedilol (COREG) 6.25 MG tablet, TAKE 1 TABLET BY MOUTH TWICE DAILY, Disp: 180 tablet, Rfl: 1 .  COMFORT EZ PEN NEEDLES 32G X 4 MM MISC, ,  Disp: , Rfl: 5 .  diclofenac sodium (VOLTAREN) 1 % GEL, APPLY TO AFFECTED AREA 3 TIMES DAILY AS NEEDED (Patient taking differently: Apply 2 g topically 3 (three) times daily as needed (pain). ), Disp: 100 g, Rfl: 1 .  furosemide (LASIX) 40 MG tablet, Take 1 tablet (40 mg total) by mouth daily., Disp: 90 tablet, Rfl: 1 .  glucose blood test strip, Test up to 4 times daily, Disp: 200 each, Rfl: 12 .  Lancet Devices (ADVOCATE LANCING DEVICE) MISC, , Disp: , Rfl:  .  potassium chloride SA (K-DUR) 20 MEQ tablet, potassium chloride ER 20 mEq tablet,extended release(part/cryst), Disp: 90 tablet, Rfl: 0 .  rosuvastatin (CRESTOR) 20 MG tablet, TAKE 1 TABLET BY MOUTH EVERY DAY, Disp: 90 tablet, Rfl: 0 .  telmisartan (MICARDIS) 80 MG tablet, Take 1 tablet (80 mg total) by mouth daily., Disp: 90 tablet, Rfl: 1 .  TRADJENTA 5 MG TABS tablet, TK 1 T PO QD, Disp: , Rfl:  .  traMADol (ULTRAM) 50 MG tablet, Take 1 tablet (50 mg total) by mouth every 6 (six) hours as needed., Disp: 20 tablet, Rfl: 0 .  TRESIBA FLEXTOUCH 200 UNIT/ML SOPN, ADMINISTER 34 UNITS UNDER THE SKIN EVERY EVENING. MAX TITRATION- 80 UNITS, Disp: 9 mL, Rfl: 2   Allergies  Allergen Reactions  . Tradjenta [Linagliptin] Swelling     Review of Systems  Constitutional: Negative.   Eyes: Negative for blurred vision.  Respiratory: Negative.  Negative for shortness of breath.  Cardiovascular: Negative.  Negative for chest pain and palpitations.  Gastrointestinal: Negative.   Neurological: Negative.        She c/o memory issues.  She feels she is more forgetful. She is not sure what may have contributed to her symptoms.   Psychiatric/Behavioral: Negative.      Today's Vitals   12/14/18 1544  BP: 140/72  Pulse: 97  Temp: 98 F (36.7 C)  TempSrc: Oral  SpO2: 98%  Weight: 182 lb 9.6 oz (82.8 kg)  Height: 4' 11.8" (1.519 m)   Body mass index is 35.9 kg/m.   Objective:  Physical Exam Vitals signs and nursing note reviewed.   Constitutional:      Appearance: Normal appearance. She is obese.  HENT:     Head: Normocephalic and atraumatic.  Cardiovascular:     Rate and Rhythm: Normal rate and regular rhythm.     Heart sounds: Normal heart sounds.  Pulmonary:     Effort: Pulmonary effort is normal.     Breath sounds: Normal breath sounds.  Skin:    General: Skin is warm.  Neurological:     General: No focal deficit present.     Mental Status: She is alert.  Psychiatric:        Mood and Affect: Mood normal.        Behavior: Behavior normal.         Assessment And Plan:     1. Type 2 diabetes mellitus with stage 3 chronic kidney disease, with long-term current use of insulin, unspecified whether stage 3a or 3b CKD ([place])  I will check labs as listed below.  She will continue with current meds for now. She is encouraged to keep track of her daily blood sugars so her insulin dose can be adjusted accordingly.   - Hemoglobin A1c - CMP14+EGFR - Lipid panel  2. Parenchymal renal hypertension, stage 1 through stage 4 or unspecified chronic kidney disease  Chronic, fair control. She will continue with current meds.  She is encouraged to avoid adding salt to her foods.   3. Memory loss  I will check labs as listed below.  I will make further recommendations once her labs are available for review. Pt also advised that persistently elevated BS can contribute to these symptoms.   - RPR - TSH  4. Class 2 severe obesity due to excess calories with serious comorbidity and body mass index (BMI) of 35.0 to 35.9 in adult [facility])  She is encouraged to perform chair exercises five days a weeek. I would like for her to try to lose ten pounds to decrease cardiac risk.   5. Need for influenza vaccination  - Flu vaccine HIGH DOSE PF (Fluzone High dose)       Helen Greenland, MD    THE PATIENT IS ENCOURAGED TO PRACTICE SOCIAL DISTANCING DUE TO THE COVID-19 PANDEMIC.

## 2018-12-30 ENCOUNTER — Telehealth: Payer: Medicare Other

## 2018-12-31 ENCOUNTER — Ambulatory Visit: Payer: Self-pay | Admitting: Pharmacist

## 2018-12-31 DIAGNOSIS — E1122 Type 2 diabetes mellitus with diabetic chronic kidney disease: Secondary | ICD-10-CM

## 2018-12-31 DIAGNOSIS — I10 Essential (primary) hypertension: Secondary | ICD-10-CM

## 2018-12-31 DIAGNOSIS — Z794 Long term (current) use of insulin: Secondary | ICD-10-CM

## 2019-01-03 NOTE — Patient Instructions (Signed)
Visit Information  Goals Addressed            This Visit's Progress     Patient Stated   . I don't know if insulin regimen is working (pt-stated)       Current Barriers:  [name] Kitchen Knowledge Deficits related to managing diabetes  Pharmacist Clinical Goal(s):  [name] Kitchen Over the next 90 days, patient will demonstrate Improved medication adherence as evidenced by BGs within range, FBG<130, no hypoglycemia . Over the next 90 days, patient will work with CCM team to address needs related to managing diabetes 08/03/18: re-established target goal date to 75 days due to treatment delays secondary to COVID-19  CCM RN CM Interventions: 11/25/18 call completed with patient    Assessed for medication adherence; discussed patient's Tresiba dosage and frequency - Ms. Reppond states she is self administering this medication every am without difficulty  Discussed CBG's this past week (pt runs lower in the am, lowest BG 79, average in the 170's with the highest 200)  Reviewed with patient how to handle hypo/hyperglycemia events and when to call the doctor if unable to resolve her symptoms and or improve her BG levels  Discussed patient continues to check her CBG every am before meals and sometimes will recheck her CBG if not feeling well or if experiencing hypoglycemia at anytime during the daytime  Positive reinforcement provided to patient for adhering to her DM plan of care and closely monitoring her glucose levels   Discussed plans with patient for ongoing care management follow up and provided patient with direct contact information for care management team  CCM PharmD Interventions: 12/31/18 call completed with patient    Patient continues on Tresiba 38 units daily-she has an aid come in daily which she states is paid be her grandson.  Discussed CBG's- patient denies hypoglycemia; her highest readings in the upper 100s (when she eats sweets, etc), usually runs 140-160 in the AM  Patient aware of how  to respond to low blood sugar.  Discussed patient assistance with patient for the next calendar year. Will complete paperwork at her next PCP visit  Patient Self Care Activities:  . Currently UNABLE TO independently manage diabetes; chronic conditions  Please see past updates related to this goal by clicking on the "Past Updates" button in the selected goal         The patient verbalized understanding of instructions provided today and declined a print copy of patient instruction materials.   The care management team will reach out to the patient again over the next 6 weeks.  SIGNATURE Regina Eck, PharmD, BCPS Clinical Pharmacist, [facility] [place]: [phone]

## 2019-01-03 NOTE — Progress Notes (Signed)
Chronic Care Management   Visit Note  12/31/2018 Name: SELENNE COGGIN MRN: [id number] DOB: 02-May-1933  Referred by: Glendale Chard, MD Reason for referral : Chronic Care Management   Temiloluwa LILYGRACE RODICK is a 85 y.o. year old female who is a primary care patient of Glendale Chard, MD. The CCM team was consulted for assistance with chronic disease management and care coordination needs related to DMII  Review of patient status, including review of consultants reports, relevant laboratory and other test results, and collaboration with appropriate care team members and the patient's provider was performed as part of comprehensive patient evaluation and provision of chronic care management services.    I spoke with Ms. Karren by telephone today.  Advanced Directives Status: N See Care Plan and Vyn[facility] application for related entries.   Medications: Outpatient Encounter Medications as of 12/31/2018  Medication Sig Note  . ADVOCATE LANCETS MISC  03/10/2013: Received from: External Pharmacy  . Alcohol Swabs (ALCOHOL PREP) 70 % PADS  03/10/2013: Received from: External Pharmacy  . aspirin 81 MG chewable tablet Chew 81 mg by mouth at bedtime.   . Calcium Carbonate-Vitamin D (CALTRATE 600+D PO) Take 1 tablet by mouth daily.   . carvedilol (COREG) 6.25 MG tablet TAKE 1 TABLET BY MOUTH TWICE DAILY   . COMFORT EZ PEN NEEDLES 32G X 4 MM MISC    . diclofenac sodium (VOLTAREN) 1 % GEL APPLY TO AFFECTED AREA 3 TIMES DAILY AS NEEDED (Patient taking differently: Apply 2 g topically 3 (three) times daily as needed (pain). )   . furosemide (LASIX) 40 MG tablet Take 1 tablet (40 mg total) by mouth daily.   [name] Kitchen glucose blood test strip Test up to 4 times daily   . Lancet Devices (ADVOCATE LANCING DEVICE) MISC  03/10/2013: Received from: External Pharmacy  . potassium chloride SA (K-DUR) 20 MEQ tablet potassium chloride ER 20 mEq tablet,extended release(part/cryst)   . rosuvastatin (CRESTOR) 20 MG tablet TAKE 1 TABLET BY  MOUTH EVERY DAY   . telmisartan (MICARDIS) 80 MG tablet Take 1 tablet (80 mg total) by mouth daily.   . TRADJENTA 5 MG TABS tablet TK 1 T PO QD   . traMADol (ULTRAM) 50 MG tablet Take 1 tablet (50 mg total) by mouth every 6 (six) hours as needed.   . TRESIBA FLEXTOUCH 200 UNIT/ML SOPN ADMINISTER 34 UNITS UNDER THE SKIN EVERY EVENING. MAX TITRATION- 80 UNITS    No facility-administered encounter medications on file as of 12/31/2018.      Objective:   Goals Addressed            This Visit's Progress     Patient Stated   . I don't know if insulin regimen is working (pt-stated)       Current Barriers:  [name] Kitchen Knowledge Deficits related to managing diabetes  Pharmacist Clinical Goal(s):  [name] Kitchen Over the next 90 days, patient will demonstrate Improved medication adherence as evidenced by BGs within range, FBG<130, no hypoglycemia . Over the next 90 days, patient will work with CCM team to address needs related to managing diabetes 08/03/18: re-established target goal date to 28 days due to treatment delays secondary to COVID-19  CCM RN CM Interventions: 11/25/18 call completed with patient    Assessed for medication adherence; discussed patient's Tresiba dosage and frequency - Ms. Janis states she is self administering this medication every am without difficulty  Discussed CBG's this past week (pt runs lower in the am, lowest BG 79, average in the  170's with the highest 200)  Reviewed with patient how to handle hypo/hyperglycemia events and when to call the doctor if unable to resolve her symptoms and or improve her BG levels  Discussed patient continues to check her CBG every am before meals and sometimes will recheck her CBG if not feeling well or if experiencing hypoglycemia at anytime during the daytime  Positive reinforcement provided to patient for adhering to her DM plan of care and closely monitoring her glucose levels   Discussed plans with patient for ongoing care management  follow up and provided patient with direct contact information for care management team  CCM PharmD Interventions: 12/31/18 call completed with patient    Patient continues on Tresiba 38 units daily-she has an aid come in daily which she states is paid be her grandson.  Discussed CBG's- patient denies hypoglycemia; her highest readings in the upper 100s (when she eats sweets, etc), usually runs 140-160 in the AM  Patient aware of how to respond to low blood sugar.  Discussed patient assistance with patient for the next calendar year. Will complete paperwork at her next PCP visit  Patient Self Care Activities:  . Currently UNABLE TO independently manage diabetes; chronic conditions  Please see past updates related to this goal by clicking on the "Past Updates" button in the selected goal          Plan:   The care management team will reach out to the patient again over the next 6 weeks   Provider Signature [name], PharmD, BCPS Clinical Pharmacist, [facility]  [facility]  Direct Dial: [phone]

## 2019-01-10 ENCOUNTER — Other Ambulatory Visit: Payer: Self-pay

## 2019-01-10 MED ORDER — POTASSIUM CHLORIDE CRYS ER 20 MEQ PO TBCR: EXTENDED_RELEASE_TABLET | ORAL | 1 refills | Status: DC

## 2019-01-11 ENCOUNTER — Telehealth: Payer: Self-pay

## 2019-01-11 ENCOUNTER — Other Ambulatory Visit: Payer: Self-pay

## 2019-01-11 MED ORDER — ROSUVASTATIN CALCIUM 10 MG PO TABS: ORAL_TABLET | ORAL | 1 refills | Status: DC

## 2019-01-11 NOTE — Telephone Encounter (Signed)
I spoke with the pt's granddaughter Caryl Pina [phone] and notified her that Dr. Baird Cancer wants the pt to take Rosuvastatin  20 mg on Monday, Wednesday, Fridays only.  Mrs. Laboy and Caryl Pina said that the 20 mg needed a refill.  They were told that a prescription for 10 mg was faxed to the pharmacy and for the pt to take 1 tab Monday - Friday.

## 2019-01-12 ENCOUNTER — Telehealth: Payer: Self-pay

## 2019-01-12 NOTE — Telephone Encounter (Signed)
Demetria called for the pt and wanted to know if they should still be checking the pt's weight because they have been checking it for a couple months and that it's not changing, Demetria also wanted to know what she can do about the pt's blood sugars, mon am 64, tues am 6, wednes am 68 she leaves a peanut butter sandwich by the pt's bed at night to eat but that the pt forgets to eat it and that the person who sits with her at night doesn't give it to her. [phone].  The pt was told that Dr. Baird Cancer said to eat 1/2 of the sandwich at night.  I tried to call Demetria back but kept getting a busy signal.

## 2019-01-14 ENCOUNTER — Other Ambulatory Visit: Payer: Medicare Other | Admitting: Hospice

## 2019-01-14 ENCOUNTER — Other Ambulatory Visit: Payer: Self-pay

## 2019-01-14 DIAGNOSIS — Z515 Encounter for palliative care: Secondary | ICD-10-CM

## 2019-01-14 DIAGNOSIS — I509 Heart failure, unspecified: Secondary | ICD-10-CM

## 2019-01-14 NOTE — Progress Notes (Signed)
[facility] Palliative Care Consult Note Telephone: [phone]  Fax: [phone]  PATIENT NAME: Helen Richardson DOB: 1933/07/18 MRN: [id number]  PRIMARY CARE PROVIDER:   Glendale Chard, MD  REFERRING PROVIDER:  Glendale Chard, [address] [place],   [address]  RESPONSIBLE PARTY:  Vincenza Dail, grandson Dimmitt, caregiver [phone]   TELEHEALTH VISIT STATEMENT Due to the COVID-19 crisis, this visit was done via telephone from my office. It was initiated and consented to by this patient and/or family.  RECOMMENDATIONS/PLAN:   Advance Care Planning/Goals of Care: Telehealth Visit consisted of building trust and discussions on Palliative Medicine as specialized medical care for people living with serious illness, aimed at facilitating advance care plan, symptoms relief and establishing goals of care. Goals of care include to maximize quality of life and symptom management. Patient remains a full code.  Symptom management: Significant improvement in blood sugar control, A1c in April '20 was 12.1, in May '20 was 10.2 and last month was 9.7. Morning CBG reading <130 with recent occasional reading in the 60s. She denied hypoglycemic s/s and said overall she feels well. Education provided to have bedtime snack or half a sandwich. Patient verbalized understanding and said she would do same. She continues on [place] as ordered. She denied edema or shortness of breath; continues on Lasix as ordered. She said she walks but still have difficulty with strength and gait. She discussed this with her PCP who will arrange for PT services for her. She said that would start soon. Caregivers Dea and Caryl Comes continue to provide her care at home. Ongoing supportive care encouraged.  Follow up: Palliative care will continue to follow patient for goals of care clarification and symptom  management   I spent 30  minutes providing this consultation, from 3.00pm to 3.30pm. More than 50% of the time in this consultation was spent on coordinating communication  HISTORY OF PRESENT ILLNESS:  Helen Richardson is a 85 y.o. year old female with multiple medical problems including DMT2, CHF, HTN. Palliative Care was asked to help address goals of care.   CODE STATUS: Full  PPS: 50% HOSPICE ELIGIBILITY/DIAGNOSIS: TBD  PAST MEDICAL HISTORY:  Past Medical History:  Diagnosis Date  . CHF (congestive heart failure) ([place])   . Diabetes mellitus without complication ([place])   . High cholesterol   . HTN (hypertension)   . Obesity     SOCIAL HX:  Social History   Tobacco Use  . Smoking status: Never Smoker  . Smokeless tobacco: Never Used  Substance Use Topics  . Alcohol use: No    ALLERGIES:  Allergies  Allergen Reactions  . Tradjenta [Linagliptin] Swelling     PERTINENT MEDICATIONS:  Outpatient Encounter Medications as of 01/14/2019  Medication Sig  . ADVOCATE LANCETS MISC   . Alcohol Swabs (ALCOHOL PREP) 70 % PADS   . aspirin 81 MG chewable tablet Chew 81 mg by mouth at bedtime.  . Calcium Carbonate-Vitamin D (CALTRATE 600+D PO) Take 1 tablet by mouth daily.  . carvedilol (COREG) 6.25 MG tablet TAKE 1 TABLET BY MOUTH TWICE DAILY  . COMFORT EZ PEN NEEDLES 32G X 4 MM MISC   . diclofenac sodium (VOLTAREN) 1 % GEL APPLY TO AFFECTED AREA 3 TIMES DAILY AS NEEDED (Patient taking differently: Apply 2 g topically 3 (three) times daily as needed (pain). )  . furosemide (LASIX) 40 MG tablet Take 1 tablet (40 mg total) by mouth daily.  [name] Kitchen  glucose blood test strip Test up to 4 times daily  . Lancet Devices (ADVOCATE LANCING DEVICE) MISC   . potassium chloride SA (KLOR-CON) 20 MEQ tablet potassium chloride ER 20 mEq tablet,extended release(part/cryst)  . rosuvastatin (CRESTOR) 10 MG tablet Take one tablet by mouth Monday -Friday. Do not take on Saturday or sunday  . telmisartan  (MICARDIS) 80 MG tablet Take 1 tablet (80 mg total) by mouth daily.  . TRADJENTA 5 MG TABS tablet TK 1 T PO QD  . traMADol (ULTRAM) 50 MG tablet Take 1 tablet (50 mg total) by mouth every 6 (six) hours as needed.  . TRESIBA FLEXTOUCH 200 UNIT/ML SOPN ADMINISTER 34 UNITS UNDER THE SKIN EVERY EVENING. MAX TITRATION- 80 UNITS   No facility-administered encounter medications on file as of 01/14/2019.      Rosaura Carpenter, NP

## 2019-01-17 ENCOUNTER — Telehealth: Payer: Self-pay

## 2019-01-25 ENCOUNTER — Other Ambulatory Visit: Payer: Self-pay

## 2019-01-25 ENCOUNTER — Telehealth: Payer: Medicare Other

## 2019-01-25 ENCOUNTER — Ambulatory Visit (INDEPENDENT_AMBULATORY_CARE_PROVIDER_SITE_OTHER): Payer: Medicare Other

## 2019-01-25 DIAGNOSIS — E1122 Type 2 diabetes mellitus with diabetic chronic kidney disease: Secondary | ICD-10-CM

## 2019-01-25 DIAGNOSIS — I509 Heart failure, unspecified: Secondary | ICD-10-CM

## 2019-01-25 DIAGNOSIS — N183 Chronic kidney disease, stage 3 unspecified: Secondary | ICD-10-CM | POA: Diagnosis not present

## 2019-01-25 DIAGNOSIS — Z794 Long term (current) use of insulin: Secondary | ICD-10-CM

## 2019-01-25 DIAGNOSIS — I10 Essential (primary) hypertension: Secondary | ICD-10-CM

## 2019-01-26 ENCOUNTER — Ambulatory Visit: Payer: Self-pay | Admitting: Pharmacist

## 2019-01-26 DIAGNOSIS — Z794 Long term (current) use of insulin: Secondary | ICD-10-CM

## 2019-01-26 DIAGNOSIS — E1122 Type 2 diabetes mellitus with diabetic chronic kidney disease: Secondary | ICD-10-CM

## 2019-01-26 DIAGNOSIS — I10 Essential (primary) hypertension: Secondary | ICD-10-CM

## 2019-01-26 NOTE — Patient Instructions (Signed)
Visit Information  Goals Addressed            This Visit's Progress     Patient Stated   . "I am having more arthritis pain in my arms and legs" (pt-stated)       Current Barriers:  . Poor adherence to following a HEP . Osteoarthritis  . Impaired Physical Mobility   Nurse Case Manager Clinical Goal(s):  [name] Kitchen Over the next 60 days, patient will verbalize understanding of plan for resumption of in home PT to evaluate and treat impaired physical mobility, poor gait, decreased ROM  CCM RN CM Interventions:  01/25/19 call completed with patient and granddaughter Caryl Pina   . Evaluation of current treatment plan related to Osteoarthritis pain and Impaired Physical Mobility and patient's adherence to plan as established by provider. . Advised patient to expect a call from the [facility] to schedule an intake visit for evaluation and treatment of Impaired Physical Mobility and decreased ROM  . Collaborated with PCP Dr. Glendale Chard regarding referral for in home PT . Discussed plans with patient for ongoing care management follow up and provided patient with direct contact information for care management team . Discussed with patient and granddaughter that patient is established with Dr. Onnie Graham Orthopedic MD but does not have a scheduled follow up visit at this time . Discussed patient feels she will benefit from revisiting in home PT; discussed she would like to work with PT prior to following up with Orthopedics   Patient Self Care Activities:  . Patient is currently UNABLE TO independently provide self-care or self administer medications  Initial goal documentation     . I don't know if insulin regimen is working (pt-stated)       Current Barriers:  [name] Kitchen Knowledge Deficits related to managing diabetes  Pharmacist Clinical Goal(s):  [name] Kitchen Over the next 90 days, patient will demonstrate Improved medication adherence as evidenced by BGs within range, FBG<130, no hypoglycemia . Over the next 90 days,  patient will work with CCM team to address needs related to managing diabetes 08/03/18: re-established target goal date to 73 days due to treatment delays secondary to COVID-19  CCM RN CM Interventions: 01/25/19 call completed with patient and granddaughter Caryl Pina   . Evaluation of current treatment plan related to Diabetes and patient's adherence to plan as established by provider. . Provided education to patient re: the importance of eating a healthy bedtime snack to help avoid hypoglycemic events while sleeping; instructed patient to eat 1/2 peanut butter sandwich at bedtime and to continue to monitor her am blood sugars  . Reviewed medications with patient and discussed patient is taking [place] as directed; 34 units at bedtime . Collaborated with embedded Pharm D and PCP regarding patient's reported hypoglycemic events, with several readings between 60-68 and the instructions given to patient to eat 1/2 peanut butter sandwich at bedtime and to continue to monitor her CBGs and record . Discussed plans with patient for ongoing care management follow up and provided patient with direct contact information for care management team . Advised patient, providing education and rationale, to check cbg before meals daily and record, calling CCM team and PCP for findings outside established parameters.    Patient Self Care Activities:  . Currently UNABLE TO independently manage diabetes; chronic conditions  Please see past updates related to this goal by clicking on the "Past Updates" button in the selected goal        Other   . "I think my grandmother  has CHF"       Grandson stated:   Current Barriers:  [name] Kitchen Knowledge Deficits related to cause for lower leg edema and recent fall   Nurse Case Manager Clinical Goal(s):  [name] Kitchen Over the next 90 days, patient and grandson Matricia Begnaud will work with the CCM team to address care coordination needs and disease specific education needs for health related  comorbidies and how to self manage. 07/15/18-Target goal date changed to 90 days Goal Met . 11/25/18 Over the next 90 days patient report increased adherence with Self monitoring her weights daily and recording the weights for the CCM team and PCP to review  . 11/25/18 Over the next 90 days, patient will experience no ED events and or IP admissions secondary to CHF exacerbation   CCM RN CM Interventions:  01/25/19 completed call with patient and granddaughter Caryl Pina   . Evaluation of current treatment plan related to CHF  and patient's adherence to plan as established by provider. . Discussed plans with patient for ongoing care management follow up and provided patient with direct contact information for care management team . Advised patient, providing education and rationale, to weigh daily and record, calling PCP office and or CCM team for weight gain of 3lbs overnight or 5 pounds in a week.  . Discussed patient continues to check daily weights, reports her weights are stable with no increase in shortness of breath, no increase in peripheral edema, reports no ascites   Patient Self Care Activities:  Patient is currently UNABLE TO independently provide self-care or self administer medications  Please see past updates related to this goal by clicking on the "Past Updates" button in the selected goal          The patient verbalized understanding of instructions provided today and declined a print copy of patient instruction materials.   Telephone follow up appointment with care management team member scheduled for: 03/10/19  Barb Merino, RN, BSN, CCM Care Management Coordinator [facility]  Direct Phone: [phone]

## 2019-01-26 NOTE — Chronic Care Management (AMB) (Signed)
Chronic Care Management   Follow Up Note   01/25/2019 Name: Helen Richardson MRN: [id number] DOB: May 20, 1933  Referred by: Glendale Chard, MD Reason for referral : Chronic Care Management (CCM RNCM Telephone Outreach )   Helen Richardson is a 85 y.o. year old female who is a primary care patient of Glendale Chard, MD. The CCM team was consulted for assistance with chronic disease management and care coordination needs.    Review of patient status, including review of consultants reports, relevant laboratory and other test results, and collaboration with appropriate care team members and the patient's provider was performed as part of comprehensive patient evaluation and provision of chronic care management services.    SDOH (Social Determinants of Health) screening performed today: None. See Care Plan for related entries.   Placed outbound call to Helen Richardson to follow up on her DM, CHF and Arthralgia.    Outpatient Encounter Medications as of 01/25/2019  Medication Sig Note  . ADVOCATE LANCETS MISC  03/10/2013: Received from: External Pharmacy  . Alcohol Swabs (ALCOHOL PREP) 70 % PADS  03/10/2013: Received from: External Pharmacy  . aspirin 81 MG chewable tablet Chew 81 mg by mouth at bedtime.   . Calcium Carbonate-Vitamin D (CALTRATE 600+D PO) Take 1 tablet by mouth daily.   . carvedilol (COREG) 6.25 MG tablet TAKE 1 TABLET BY MOUTH TWICE DAILY   . COMFORT EZ PEN NEEDLES 32G X 4 MM MISC    . diclofenac sodium (VOLTAREN) 1 % GEL APPLY TO AFFECTED AREA 3 TIMES DAILY AS NEEDED (Patient taking differently: Apply 2 g topically 3 (three) times daily as needed (pain). )   . furosemide (LASIX) 40 MG tablet Take 1 tablet (40 mg total) by mouth daily.   [name] Kitchen glucose blood test strip Test up to 4 times daily   . Lancet Devices (ADVOCATE LANCING DEVICE) MISC  03/10/2013: Received from: External Pharmacy  . potassium chloride SA (KLOR-CON) 20 MEQ tablet potassium chloride ER 20 mEq tablet,extended  release(part/cryst)   . rosuvastatin (CRESTOR) 10 MG tablet Take one tablet by mouth Monday -Friday. Do not take on Saturday or sunday   . telmisartan (MICARDIS) 80 MG tablet Take 1 tablet (80 mg total) by mouth daily.   . TRADJENTA 5 MG TABS tablet TK 1 T PO QD   . traMADol (ULTRAM) 50 MG tablet Take 1 tablet (50 mg total) by mouth every 6 (six) hours as needed.   . TRESIBA FLEXTOUCH 200 UNIT/ML SOPN ADMINISTER 34 UNITS UNDER THE SKIN EVERY EVENING. MAX TITRATION- 80 UNITS    No facility-administered encounter medications on file as of 01/25/2019.      Goals Addressed      Patient Stated   . "I am having more arthritis pain in my arms and legs" (pt-stated)       Current Barriers:  . Poor adherence to following a HEP . Osteoarthritis  . Impaired Physical Mobility   Nurse Case Manager Clinical Goal(s):  [name] Kitchen Over the next 60 days, patient will verbalize understanding of plan for resumption of in home PT to evaluate and treat impaired physical mobility, poor gait, decreased ROM  CCM RN CM Interventions:  01/25/19 call completed with patient and granddaughter Caryl Pina   . Evaluation of current treatment plan related to Osteoarthritis pain and Impaired Physical Mobility and patient's adherence to plan as established by provider. . Advised patient to expect a call from the [facility] to schedule an intake visit for evaluation and treatment of Impaired Physical  Mobility and decreased ROM  . Collaborated with PCP Dr. Glendale Chard regarding referral for in home PT . Discussed plans with patient for ongoing care management follow up and provided patient with direct contact information for care management team . Discussed with patient and granddaughter that patient is established with Dr. Onnie Graham Orthopedic MD but does not have a scheduled follow up visit at this time . Discussed patient feels she will benefit from revisiting in home PT; discussed she would like to work with PT prior to following up with  Orthopedics   Patient Self Care Activities:  . Patient is currently UNABLE TO independently provide self-care or self administer medications  Initial goal documentation     . I don't know if insulin regimen is working (pt-stated)       Current Barriers:  [name] Kitchen Knowledge Deficits related to managing diabetes  Pharmacist Clinical Goal(s):  [name] Kitchen Over the next 90 days, patient will demonstrate Improved medication adherence as evidenced by BGs within range, FBG<130, no hypoglycemia . Over the next 90 days, patient will work with CCM team to address needs related to managing diabetes 08/03/18: re-established target goal date to 57 days due to treatment delays secondary to COVID-19  CCM RN CM Interventions: 01/25/19 call completed with patient and granddaughter Caryl Pina   . Evaluation of current treatment plan related to Diabetes and patient's adherence to plan as established by provider. . Provided education to patient re: the importance of eating a healthy bedtime snack to help avoid hypoglycemic events while sleeping; instructed patient to eat 1/2 peanut butter sandwich at bedtime and to continue to monitor her am blood sugars  . Reviewed medications with patient and discussed patient is taking [place] as directed; 34 units at bedtime . Collaborated with embedded Pharm D and PCP regarding patient's reported hypoglycemic events, with several readings between 60-68 and the instructions given to patient to eat 1/2 peanut butter sandwich at bedtime and to continue to monitor her CBGs and record . Discussed plans with patient for ongoing care management follow up and provided patient with direct contact information for care management team . Advised patient, providing education and rationale, to check cbg before meals daily and record, calling CCM team and PCP for findings outside established parameters.    Patient Self Care Activities:  . Currently UNABLE TO independently manage diabetes; chronic  conditions  Please see past updates related to this goal by clicking on the "Past Updates" button in the selected goal        Other   . "I think my grandmother has CHF"       Grandson stated:   Current Barriers:  [name] Kitchen Knowledge Deficits related to cause for lower leg edema and recent fall   Nurse Case Manager Clinical Goal(s):  [name] Kitchen Over the next 90 days, patient and grandson Markeesha Char will work with the CCM team to address care coordination needs and disease specific education needs for health related comorbidies and how to self manage. 07/15/18-Target goal date changed to 90 days Goal Met . 11/25/18 Over the next 90 days patient report increased adherence with Self monitoring her weights daily and recording the weights for the CCM team and PCP to review  . 11/25/18 Over the next 90 days, patient will experience no ED events and or IP admissions secondary to CHF exacerbation   CCM RN CM Interventions:  01/25/19 completed call with patient and granddaughter Caryl Pina   . Evaluation of current treatment plan related to CHF  and  patient's adherence to plan as established by provider. . Discussed plans with patient for ongoing care management follow up and provided patient with direct contact information for care management team . Advised patient, providing education and rationale, to weigh daily and record, calling PCP office and or CCM team for weight gain of 3lbs overnight or 5 pounds in a week.  . Discussed patient continues to check daily weights, reports her weights are stable with no increase in shortness of breath, no increase in peripheral edema, reports no ascites   Patient Self Care Activities:  Patient is currently UNABLE TO independently provide self-care or self administer medications  Please see past updates related to this goal by clicking on the "Past Updates" button in the selected goal         Telephone follow up appointment with care management team member scheduled for:  03/10/19   Barb Merino, RN, BSN, CCM Care Management Coordinator [facility]  Direct Phone: [phone]

## 2019-01-26 NOTE — Progress Notes (Signed)
  Chronic Care Management   Outreach Note  01/26/2019 Name: Helen Richardson MRN: [id number] DOB: [birth year]  Referred by: Glendale Chard, MD Reason for referral : Chronic Care Management   An unsuccessful telephone outreach was attempted today. The patient was referred to the case management team by for assistance with care management and care coordination.   Follow Up Plan: The care management team will reach out to the patient again over the next 7 days.  The care management team is available to follow up with the patient after provider conversation with the patient regarding recommendation for care management engagement and subsequent re-referral to the care management team.   SIGNATURE Regina Eck, PharmD, BCPS Clinical Pharmacist, [facility] [place]: [phone]

## 2019-02-03 ENCOUNTER — Ambulatory Visit (INDEPENDENT_AMBULATORY_CARE_PROVIDER_SITE_OTHER): Payer: Medicare Other | Admitting: Pharmacist

## 2019-02-03 DIAGNOSIS — N183 Chronic kidney disease, stage 3 unspecified: Secondary | ICD-10-CM

## 2019-02-03 DIAGNOSIS — I10 Essential (primary) hypertension: Secondary | ICD-10-CM

## 2019-02-03 DIAGNOSIS — Z794 Long term (current) use of insulin: Secondary | ICD-10-CM | POA: Diagnosis not present

## 2019-02-03 DIAGNOSIS — E1122 Type 2 diabetes mellitus with diabetic chronic kidney disease: Secondary | ICD-10-CM | POA: Diagnosis not present

## 2019-02-07 ENCOUNTER — Telehealth: Payer: Self-pay

## 2019-02-07 NOTE — Progress Notes (Signed)
Chronic Care Management   Visit Note  02/03/2019 Name: Helen Richardson MRN: [id number] DOB: [birth year]  Referred by: Helen Peng, MD Reason for referral : Chronic Care Management   Helen Richardson is a 85 y.o. year old female who is a primary care patient of Helen Peng, MD. The CCM team was consulted for assistance with chronic disease management and care coordination needs related to DMII  Review of patient status, including review of consultants reports, relevant laboratory and other test results, and collaboration with appropriate care team members and the patient's provider was performed as part of comprehensive patient evaluation and provision of chronic care management services.    I spoke with Helen Richardson by telephone today.  Medications: Outpatient Encounter Medications as of 02/03/2019  Medication Sig Note  . ADVOCATE LANCETS MISC  03/10/2013: Received from: External Pharmacy  . Alcohol Swabs (ALCOHOL PREP) 70 % PADS  03/10/2013: Received from: External Pharmacy  . aspirin 81 MG chewable tablet Chew 81 mg by mouth at bedtime.   . Calcium Carbonate-Vitamin D (CALTRATE 600+D PO) Take 1 tablet by mouth daily.   . carvedilol (COREG) 6.25 MG tablet TAKE 1 TABLET BY MOUTH TWICE DAILY   . COMFORT EZ PEN NEEDLES 32G X 4 MM MISC    . diclofenac sodium (VOLTAREN) 1 % GEL APPLY TO AFFECTED AREA 3 TIMES DAILY AS NEEDED (Patient taking differently: Apply 2 g topically 3 (three) times daily as needed (pain). )   . furosemide (LASIX) 40 MG tablet Take 1 tablet (40 mg total) by mouth daily.   [name] Kitchen glucose blood test strip Test up to 4 times daily   . Lancet Devices (ADVOCATE LANCING DEVICE) MISC  03/10/2013: Received from: External Pharmacy  . potassium chloride SA (KLOR-CON) 20 MEQ tablet potassium chloride ER 20 mEq tablet,extended release(part/cryst)   . rosuvastatin (CRESTOR) 10 MG tablet Take one tablet by mouth Monday -Friday. Do not take on Saturday or sunday   . telmisartan (MICARDIS) 80  MG tablet Take 1 tablet (80 mg total) by mouth daily.   . TRADJENTA 5 MG TABS tablet TK 1 T PO QD   . traMADol (ULTRAM) 50 MG tablet Take 1 tablet (50 mg total) by mouth every 6 (six) hours as needed.   . TRESIBA FLEXTOUCH 200 UNIT/ML SOPN ADMINISTER 34 UNITS UNDER THE SKIN EVERY EVENING. MAX TITRATION- 80 UNITS    No facility-administered encounter medications on file as of 02/03/2019.      Objective:   Goals Addressed            This Visit's Progress     Patient Stated   . I don't know if insulin regimen is working (pt-stated)       Current Barriers:  [name] Kitchen Knowledge Deficits related to managing diabetes  Pharmacist Clinical Goal(s):  [name] Kitchen Over the next 90 days, patient will demonstrate Improved medication adherence as evidenced by BGs within range, FBG<130, no hypoglycemia . Over the next 90 days, patient will work with CCM team to address needs related to managing diabetes 02/03/19: re-established target goal date to 90 days due to treatment delays secondary to COVID-19  CCM RN CM Interventions: 01/25/19 call completed with patient and granddaughter Helen Richardson   . Evaluation of current treatment plan related to Diabetes and patient's adherence to plan as established by provider. . Provided education to patient re: the importance of eating a healthy bedtime snack to help avoid hypoglycemic events while sleeping; instructed patient to eat 1/2 peanut butter sandwich at  bedtime and to continue to monitor her am blood sugars  . Reviewed medications with patient and discussed patient is taking [place] as directed; 34 units at bedtime . Collaborated with embedded Pharm D and PCP regarding patient's reported hypoglycemic events, with several readings between 60-68 and the instructions given to patient to eat 1/2 peanut butter sandwich at bedtime and to continue to monitor her CBGs and record . Discussed plans with patient for ongoing care management follow up and provided patient with direct  contact information for care management team . Advised patient, providing education and rationale, to check cbg before meals daily and record, calling CCM team and PCP for findings outside established parameters.    CCM PharmD Interventions: 02/03/19 call completed with patient  . Patient states he Bgs have improved.  She is starting to eat more and denies hypoglycemia (Bg<70) since last CCM RN CM call on 01/25/19. [name] Kitchen Consider reduction of [place] 34 units to [place] 30 units if BGs fall again or A1c continues to decline. Next A1c follow up on 02/22/19 at PCP office . Reinforced CCM RN CM instructions above  Patient Self Care Activities:  . Currently UNABLE TO independently manage diabetes; chronic conditions  Please see past updates related to this goal by clicking on the "Past Updates" button in the selected goal          Plan:   The care management team will reach out to the patient again over the next 14 days.   Provider Signature Regina Eck, PharmD, BCPS Clinical Pharmacist, [facility] [place]: [phone]

## 2019-02-07 NOTE — Patient Instructions (Addendum)
Visit Information  Goals Addressed            This Visit's Progress     Patient Stated   . I don't know if insulin regimen is working (pt-stated)       Current Barriers:  [name] Kitchen Knowledge Deficits related to managing diabetes  Pharmacist Clinical Goal(s):  [name] Kitchen Over the next 90 days, patient will demonstrate Improved medication adherence as evidenced by BGs within range, FBG<130, no hypoglycemia . Over the next 90 days, patient will work with CCM team to address needs related to managing diabetes 02/03/19: re-established target goal date to 61 days due to treatment delays secondary to COVID-19  CCM RN CM Interventions: 01/25/19 call completed with patient and granddaughter Caryl Pina   . Evaluation of current treatment plan related to Diabetes and patient's adherence to plan as established by provider. . Provided education to patient re: the importance of eating a healthy bedtime snack to help avoid hypoglycemic events while sleeping; instructed patient to eat 1/2 peanut butter sandwich at bedtime and to continue to monitor her am blood sugars  . Reviewed medications with patient and discussed patient is taking [place] as directed; 34 units at bedtime . Collaborated with embedded Pharm D and PCP regarding patient's reported hypoglycemic events, with several readings between 60-68 and the instructions given to patient to eat 1/2 peanut butter sandwich at bedtime and to continue to monitor her CBGs and record . Discussed plans with patient for ongoing care management follow up and provided patient with direct contact information for care management team . Advised patient, providing education and rationale, to check cbg before meals daily and record, calling CCM team and PCP for findings outside established parameters.    CCM PharmD Interventions: 02/03/19 call completed with patient  . Patient states he Bgs have improved.  She is starting to eat more and denies hypoglycemia (Bg<70) since last CCM RN  CM call on 01/25/19. [name] Kitchen Consider reduction of Tresiba 34 units to [place] 30 units if BGs fall again or A1c continues to decline. Next A1c follow up on 02/22/19 at PCP office . Reinforced CCM RN CM instructions above  Patient Self Care Activities:  . Currently UNABLE TO independently manage diabetes; chronic conditions  Please see past updates related to this goal by clicking on the "Past Updates" button in the selected goal         The patient verbalized understanding of instructions provided today and declined a print copy of patient instruction materials.   The care management team will reach out to the patient again over the next 14 days.   SIGNATURE Regina Eck, PharmD, BCPS Clinical Pharmacist, [facility] [place]: [phone]

## 2019-02-09 ENCOUNTER — Ambulatory Visit: Payer: Medicare Other | Admitting: Podiatry

## 2019-02-15 ENCOUNTER — Telehealth: Payer: Self-pay

## 2019-02-15 NOTE — Telephone Encounter (Signed)
I left pt a v/m to call the office we wanted to know if she was really requesting orthosis. YRL,RMA

## 2019-02-22 ENCOUNTER — Other Ambulatory Visit: Payer: Self-pay

## 2019-02-22 ENCOUNTER — Other Ambulatory Visit: Payer: Medicare Other

## 2019-02-22 DIAGNOSIS — E878 Other disorders of electrolyte and fluid balance, not elsewhere classified: Secondary | ICD-10-CM

## 2019-02-22 DIAGNOSIS — M25569 Pain in unspecified knee: Secondary | ICD-10-CM

## 2019-02-22 DIAGNOSIS — M2540 Effusion, unspecified joint: Secondary | ICD-10-CM

## 2019-02-23 ENCOUNTER — Other Ambulatory Visit: Payer: Self-pay

## 2019-02-23 LAB — BMP8+EGFR
BUN/Creatinine Ratio: 17 (ref 12–28)
BUN: 13 mg/dL (ref 8–27)
CO2: 21 mmol/L (ref 20–29)
Calcium: 9.6 mg/dL (ref 8.7–10.3)
Chloride: 96 mmol/L (ref 96–106)
Creatinine, Ser: 0.78 mg/dL (ref 0.57–1.00)
GFR calc Af Amer: 81 mL/min/{1.73_m2} (ref >59)
GFR calc non Af Amer: 71 mL/min/{1.73_m2} (ref >59)
Glucose: 184 mg/dL — ABNORMAL HIGH (ref 65–99)
Potassium: 4.9 mmol/L (ref 3.5–5.2)
Sodium: 132 mmol/L — ABNORMAL LOW (ref 134–144)

## 2019-02-23 LAB — CYCLIC CITRUL PEPTIDE ANTIBODY, IGG/IGA: Cyclic Citrullin Peptide Ab: 9 units (ref 0–19)

## 2019-02-23 LAB — FERRITIN: Ferritin: 240 ng/mL — ABNORMAL HIGH (ref 15–150)

## 2019-02-28 ENCOUNTER — Ambulatory Visit: Payer: Medicare Other | Admitting: Pharmacist

## 2019-02-28 DIAGNOSIS — Z794 Long term (current) use of insulin: Secondary | ICD-10-CM

## 2019-02-28 DIAGNOSIS — E1122 Type 2 diabetes mellitus with diabetic chronic kidney disease: Secondary | ICD-10-CM

## 2019-02-28 DIAGNOSIS — I10 Essential (primary) hypertension: Secondary | ICD-10-CM

## 2019-03-01 ENCOUNTER — Ambulatory Visit (INDEPENDENT_AMBULATORY_CARE_PROVIDER_SITE_OTHER): Payer: Medicare Other

## 2019-03-01 ENCOUNTER — Telehealth: Payer: Self-pay

## 2019-03-01 ENCOUNTER — Other Ambulatory Visit: Payer: Self-pay

## 2019-03-01 VITALS — Ht 63.0 in | Wt 177.0 lb

## 2019-03-01 DIAGNOSIS — Z0000 Encounter for general adult medical examination without abnormal findings: Secondary | ICD-10-CM

## 2019-03-01 NOTE — Telephone Encounter (Signed)
I spoke with the pt's granddaughter Caryl Pina [phone] and notified her that Jenny Reichmann with [facility] called and said that they haven't heard back about starting physical therapy with the pt.  Caryl Pina said to give [facility] her humber.  I called [place] back [phone] and provided her with the new contact number.

## 2019-03-01 NOTE — Progress Notes (Signed)
This visit type was conducted due to national recommendations for restrictions regarding the COVID-19 Pandemic (e.g. social distancing). This format is felt to be most appropriate for this patient at this time. All issues noted in this document were discussed and addressed. No physical exam was performed (except for noted visual exam findings with Video Visits). This patient, [name], has given permission to perform this visit via telephone. Vital signs may be absent or patient reported.  Patient location:  At home  Nurse location:  [facility] office    Subjective:   Helen Richardson is a 85 y.o. female who presents for Medicare Annual (Subsequent) preventive examination.  Review of Systems:  N/a  Cardiac Risk Factors include: advanced age (>22mn, >>19women);sedentary lifestyle;obesity (BMI >30kg/m2);diabetes mellitus;hypertension     Objective:     Vitals: Ht _0  (1.6 m) Comment: per patient  Wt 177 lb (80.3 kg) Comment: per patient  BMI 31.35 kg/m   Body mass index is 31.35 kg/m.  Advanced Directives 03/01/2019 07/01/2018 06/10/2018 05/14/2018 06/28/2015  Does Patient Have a Medical Advance Directive? _1   Would patient like information on creating a medical advance directive? - No - Patient declined No - Patient declined No - Patient declined -    Tobacco Social History   Tobacco Use  Smoking Status Never Smoker  Smokeless Tobacco Never Used     Counseling given: Not Answered   Clinical Intake:  Pre-visit preparation completed: Yes  Pain : No/denies pain     Nutritional Status: BMI > 30  Obese Nutritional Risks: None Diabetes: Yes CBG done?: No Did pt. bring in CBG monitor from home?: No  How often do you need to have someone help you when you read instructions, pamphlets, or other written materials from your doctor or pharmacy?: 1 - Never What is the last grade level you completed in school?: 12th grade  Interpreter Needed?:  No  Information entered by :: NAllen LPN  Past Medical History:  Diagnosis Date  . CHF (congestive heart failure) ([facility]   . Diabetes mellitus without complication ([facility]   . High cholesterol   . HTN (hypertension)   . Obesity    Past Surgical History:  Procedure Laterality Date  . CATARACT EXTRACTION, BILATERAL    . ingrown toenail Left    left great toenail  . PARTIAL HYSTERECTOMY     Family History  Problem Relation Age of Onset  . Diabetes Mother   . Hypertension Mother   . Hypertension Father   . Stroke Father    Social History   Socioeconomic History  . Marital status: Widowed    Spouse name: Not on file  . Number of children: Not on file  . Years of education: Not on file  . Highest education level: Not on file  Occupational History  . Occupation: retired  Tobacco Use  . Smoking status: Never Smoker  . Smokeless tobacco: Never Used  Substance and Sexual Activity  . Alcohol use: No  . Drug use: No  . Sexual activity: Not Currently  Other Topics Concern  . Not on file  Social History Narrative  . Not on file   Social Determinants of Health   Financial Resource Strain: Low Risk   . Difficulty of Paying Living Expenses: Not hard at all  Food Insecurity: No Food Insecurity  . Worried About RCharity fundraiserin the Last Year: Never true  . Ran Out of Food in the Last Year:  Never true  Transportation Needs: No Transportation Needs  . Lack of Transportation (Medical): No  . Lack of Transportation (Non-Medical): No  Physical Activity: Insufficiently Active  . Days of Exercise per Week: 7 days  . Minutes of Exercise per Session: 10 min  Stress: No Stress Concern Present  . Feeling of Stress : Not at all  Social Connections:   . Frequency of Communication with Friends and Family: Not on file  . Frequency of Social Gatherings with Friends and Family: Not on file  . Attends Religious Services: Not on file  . Active Member of Clubs or Organizations: Not on  file  . Attends Archivist Meetings: Not on file  . Marital Status: Not on file    Outpatient Encounter Medications as of 03/01/2019  Medication Sig  . ADVOCATE LANCETS MISC   . Alcohol Swabs (ALCOHOL PREP) 70 % PADS   . aspirin 81 MG chewable tablet Chew 81 mg by mouth at bedtime.  . Calcium Carbonate-Vitamin D (CALTRATE 600+D PO) Take 1 tablet by mouth daily.  . carvedilol (COREG) 6.25 MG tablet TAKE 1 TABLET BY MOUTH TWICE DAILY  . COMFORT EZ PEN NEEDLES 32G X 4 MM MISC   . diclofenac sodium (VOLTAREN) 1 % GEL APPLY TO AFFECTED AREA 3 TIMES DAILY AS NEEDED (Patient taking differently: Apply 2 g topically 3 (three) times daily as needed (pain). )  . furosemide (LASIX) 40 MG tablet Take 1 tablet (40 mg total) by mouth daily.  [name] Kitchen glucose blood test strip Test up to 4 times daily  . Lancet Devices (ADVOCATE LANCING DEVICE) MISC   . potassium chloride SA (KLOR-CON) 20 MEQ tablet potassium chloride ER 20 mEq tablet,extended release(part/cryst)  . rosuvastatin (CRESTOR) 10 MG tablet Take one tablet by mouth Monday -Friday. Do not take on Saturday or sunday  . telmisartan (MICARDIS) 80 MG tablet Take 1 tablet (80 mg total) by mouth daily.  . TRADJENTA 5 MG TABS tablet TK 1 T PO QD  . traMADol (ULTRAM) 50 MG tablet Take 1 tablet (50 mg total) by mouth every 6 (six) hours as needed.  . TRESIBA FLEXTOUCH 200 UNIT/ML SOPN ADMINISTER 34 UNITS UNDER THE SKIN EVERY EVENING. MAX TITRATION- 80 UNITS   No facility-administered encounter medications on file as of 03/01/2019.    Activities of Daily Living In your present state of health, do you have any difficulty performing the following activities: 03/01/2019  Hearing? N  Vision? N  Difficulty concentrating or making decisions? N  Walking or climbing stairs? N  Dressing or bathing? N  Doing errands, shopping? N  Preparing Food and eating ? Y  Using the Toilet? N  In the past six months, have you accidently leaked urine? N  Do you have  problems with loss of bowel control? N  Managing your Medications? Y  Managing your Finances? Y  Housekeeping or managing your Housekeeping? Y  Some recent data might be hidden    Patient Care Team: Glendale Chard, MD as PCP - General (Internal Medicine) Daneen Schick as Social Worker [place], Claudette Stapler, RN as Case Manager Lavera Guise, [facility] (Pharmacist)    Assessment:   This is a routine wellness examination for Helen Richardson.  Exercise Activities and Dietary recommendations Current Exercise Habits: Home exercise routine, Type of exercise: stretching, Time (Minutes): 10, Frequency (Times/Week): 7, Weekly Exercise (Minutes/Week): 70  Goals    . "I am having more arthritis pain in my arms and legs" (pt-stated)     Current  Barriers:  . Poor adherence to following a HEP . Osteoarthritis  . Impaired Physical Mobility   Nurse Case Manager Clinical Goal(s):  [name] Kitchen Over the next 60 days, patient will verbalize understanding of plan for resumption of in home PT to evaluate and treat impaired physical mobility, poor gait, decreased ROM  CCM RN CM Interventions:  01/25/19 call completed with patient and granddaughter Caryl Pina   . Evaluation of current treatment plan related to Osteoarthritis pain and Impaired Physical Mobility and patient's adherence to plan as established by provider. . Advised patient to expect a call from the [facility] to schedule an intake visit for evaluation and treatment of Impaired Physical Mobility and decreased ROM  . Collaborated with PCP Dr. Glendale Chard regarding referral for in home PT . Discussed plans with patient for ongoing care management follow up and provided patient with direct contact information for care management team . Discussed with patient and granddaughter that patient is established with Dr. Onnie Graham Orthopedic MD but does not have a scheduled follow up visit at this time . Discussed patient feels she will benefit from revisiting in home PT; discussed she would  like to work with PT prior to following up with Orthopedics   Patient Self Care Activities:  . Patient is currently UNABLE TO independently provide self-care or self administer medications  Initial goal documentation     . "I think my grandmother has CHF"     Grandson stated:   Current Barriers:  [name] Kitchen Knowledge Deficits related to cause for lower leg edema and recent fall   Nurse Case Manager Clinical Goal(s):  [name] Kitchen Over the next 90 days, patient and grandson Melda Mermelstein will work with the CCM team to address care coordination needs and disease specific education needs for health related comorbidies and how to self manage. 07/15/18-Target goal date changed to 90 days Goal Met . 11/25/18 Over the next 90 days patient report increased adherence with Self monitoring her weights daily and recording the weights for the CCM team and PCP to review  . 11/25/18 Over the next 90 days, patient will experience no ED events and or IP admissions secondary to CHF exacerbation   CCM RN CM Interventions:  01/25/19 completed call with patient and granddaughter Caryl Pina   . Evaluation of current treatment plan related to CHF  and patient's adherence to plan as established by provider. . Discussed plans with patient for ongoing care management follow up and provided patient with direct contact information for care management team . Advised patient, providing education and rationale, to weigh daily and record, calling PCP office and or CCM team for weight gain of 3lbs overnight or 5 pounds in a week.  . Discussed patient continues to check daily weights, reports her weights are stable with no increase in shortness of breath, no increase in peripheral edema, reports no ascites   Patient Self Care Activities:  Patient is currently UNABLE TO independently provide self-care or self administer medications  Please see past updates related to this goal by clicking on the "Past Updates" button in the selected goal        . I don't know if insulin regimen is working (pt-stated)     Current Barriers:  [name] Kitchen Knowledge Deficits related to managing diabetes  Pharmacist Clinical Goal(s):  [name] Kitchen Over the next 90 days, patient will demonstrate Improved medication adherence as evidenced by BGs within range, FBG<130, no hypoglycemia . Over the next 90 days, patient will work with CCM team to address needs  related to managing diabetes 02/03/19: re-established target goal date to 90 days due to treatment delays secondary to COVID-19  CCM RN CM Interventions: 01/25/19 call completed with patient and granddaughter Caryl Pina   . Evaluation of current treatment plan related to Diabetes and patient's adherence to plan as established by provider. . Provided education to patient re: the importance of eating a healthy bedtime snack to help avoid hypoglycemic events while sleeping; instructed patient to eat 1/2 peanut butter sandwich at bedtime and to continue to monitor her am blood sugars  . Reviewed medications with patient and discussed patient is taking [place] as directed; 34 units at bedtime . Collaborated with embedded Pharm D and PCP regarding patient's reported hypoglycemic events, with several readings between 60-68 and the instructions given to patient to eat 1/2 peanut butter sandwich at bedtime and to continue to monitor her CBGs and record . Discussed plans with patient for ongoing care management follow up and provided patient with direct contact information for care management team . Advised patient, providing education and rationale, to check cbg before meals daily and record, calling CCM team and PCP for findings outside established parameters.    CCM PharmD Interventions: 02/03/19 call completed with patient  . Patient states he Bgs have improved.  She is starting to eat more and denies hypoglycemia (Bg<70) since last CCM RN CM call on 01/25/19. [name] Kitchen Consider reduction of Tresiba 34 units to [place] 30 units if BGs fall  again or A1c continues to decline. Next A1c follow up on 02/22/19 at PCP office . Reinforced CCM RN CM instructions above  Patient Self Care Activities:  . Currently UNABLE TO independently manage diabetes; chronic conditions  Please see past updates related to this goal by clicking on the "Past Updates" button in the selected goal      . My test strips are too expensive (pt-stated)     Current Barriers:  . Financial Barriers-needs RX for test strips  Pharmacist Clinical Goal(s):  [name] Kitchen Over the next 30 days, patient will work with PharmD & PCP to address needs related to test strip & insulin affordability  Interventions: . Collaboration with provider re: medication management  . Patient using One Touch Verio glucometer--CMA assisted with sending new RX to be covered under patient's insurance.  Pharmacy states they have not received.  Will request another RX. [name] Kitchen Patient's grandson & patient are agreeable to participate in patient assistance program for [place] (pens) through [employer].  Application was discussed, prepared and mailed today. . Encouraged family to reach out if questions arise. . Will continue to follow  Patient Self Care Activities:  With help from caregivers . Self administers medications as prescribed . Attends all scheduled provider appointments . Calls pharmacy for medication refills  Please see past updates related to this goal by clicking on the "Past Updates" button in the selected goal      . Patient Stated     03/01/2019, no goals       Fall Risk Fall Risk  03/01/2019 12/14/2018 07/12/2018 06/16/2018 05/21/2018  Falls in the past year? 0 0 0 0 1  Number falls in past yr: - - - - 0  Injury with Fall? - - - - 0  Risk for fall due to : Medication side effect;Impaired balance/gait;Impaired mobility - - - -  Follow up Falls evaluation completed;Education provided;Falls prevention discussed - - - -   Is the patient's home free of loose throw rugs in walkways,  pet beds, electrical cords,  etc?   yes      Grab bars in the bathroom? yes      Handrails on the stairs?   n/a      Adequate lighting?   yes  Timed Get Up and Go performed: n/a  Depression Screen PHQ 2/9 Scores 03/01/2019 12/14/2018 07/12/2018 05/18/2018  PHQ - 2 Score 0 0 0 0  PHQ- 9 Score 0 - - -     Cognitive Function     6CIT Screen 03/01/2019  What Year? 4 points  What month? 0 points  What time? 0 points  Count back from 20 0 points  Months in reverse 0 points  Repeat phrase 0 points  Total Score 4    Immunization History  Administered Date(s) Administered  . Influenza, High Dose Seasonal PF 11/19/2017, 12/14/2018    Qualifies for Shingles Vaccine? yes  Screening Tests Health Maintenance  Topic Date Due  . TETANUS/TDAP  02/29/2020 (Originally 03/03/1952)  . PNA vac Low Risk Adult (1 of 2 - PCV13) 02/29/2020 (Originally 03/03/2000)  . HEMOGLOBIN A1C  06/21/2019  . OPHTHALMOLOGY EXAM  09/28/2019  . FOOT EXAM  10/26/2019  . INFLUENZA VACCINE  Completed  . DEXA SCAN  Completed    Cancer Screenings: Lung: Low Dose CT Chest recommended if Age 86-80 years, 30 pack-year currently smoking OR have quit w/in 15years. Patient does not qualify. Breast:  Up to date on Mammogram? Yes   Up to date of Bone Density/Dexa? Yes Colorectal: not required  Additional Screenings: : Hepatitis C Screening: n/a     Plan:    Patient has no goals set at this time.   I have personally reviewed and noted the following in the patient's chart:   . Medical and social history . Use of alcohol, tobacco or illicit drugs  . Current medications and supplements . Functional ability and status . Nutritional status . Physical activity . Advanced directives . List of other physicians . Hospitalizations, surgeries, and ER visits in previous 12 months . Vitals . Screenings to include cognitive, depression, and falls . Referrals and appointments  In addition, I have reviewed and discussed  with patient certain preventive protocols, quality metrics, and best practice recommendations. A written personalized care plan for preventive services as well as general preventive health recommendations were provided to patient.     Kellie Simmering, LPN  [id number]

## 2019-03-01 NOTE — Patient Instructions (Signed)
Helen Richardson , Thank you for taking time to come for your Medicare Wellness Visit. I appreciate your ongoing commitment to your health goals. Please review the following plan we discussed and let me know if I can assist you in the future.   Screening recommendations/referrals: Colonoscopy: not required Mammogram: not required Bone Density: 12/2013 Recommended yearly ophthalmology/optometry visit for glaucoma screening and checkup Recommended yearly dental visit for hygiene and checkup  Vaccinations: Influenza vaccine: 12/2018 Pneumococcal vaccine: declined Tdap vaccine: declined Shingles vaccine: discussed    Advanced directives: Advance directive discussed with you today.   Conditions/risks identified: obesity  Next appointment: 04/18/2019 at 3:00   Preventive Care 65 Years and Older, Female Preventive care refers to lifestyle choices and visits with your health care provider that can promote health and wellness. What does preventive care include?  A yearly physical exam. This is also called an annual well check.  Dental exams once or twice a year.  Routine eye exams. Ask your health care provider how often you should have your eyes checked.  Personal lifestyle choices, including:  Daily care of your teeth and gums.  Regular physical activity.  Eating a healthy diet.  Avoiding tobacco and drug use.  Limiting alcohol use.  Practicing safe sex.  Taking low-dose aspirin every day.  Taking vitamin and mineral supplements as recommended by your health care provider. What happens during an annual well check? The services and screenings done by your health care provider during your annual well check will depend on your age, overall health, lifestyle risk factors, and family history of disease. Counseling  Your health care provider may ask you questions about your:  Alcohol use.  Tobacco use.  Drug use.  Emotional well-being.  Home and relationship  well-being.  Sexual activity.  Eating habits.  History of falls.  Memory and ability to understand (cognition).  Work and work Statistician.  Reproductive health. Screening  You may have the following tests or measurements:  Height, weight, and BMI.  Blood pressure.  Lipid and cholesterol levels. These may be checked every 5 years, or more frequently if you are over 23 years old.  Skin check.  Lung cancer screening. You may have this screening every year starting at age 48 if you have a 30-pack-year history of smoking and currently smoke or have quit within the past 15 years.  Fecal occult blood test (FOBT) of the stool. You may have this test every year starting at age 81.  Flexible sigmoidoscopy or colonoscopy. You may have a sigmoidoscopy every 5 years or a colonoscopy every 10 years starting at age 39.  Hepatitis C blood test.  Hepatitis B blood test.  Sexually transmitted disease (STD) testing.  Diabetes screening. This is done by checking your blood sugar (glucose) after you have not eaten for a while (fasting). You may have this done every 1-3 years.  Bone density scan. This is done to screen for osteoporosis. You may have this done starting at age 57.  Mammogram. This may be done every 1-2 years. Talk to your health care provider about how often you should have regular mammograms. Talk with your health care provider about your test results, treatment options, and if necessary, the need for more tests. Vaccines  Your health care provider may recommend certain vaccines, such as:  Influenza vaccine. This is recommended every year.  Tetanus, diphtheria, and acellular pertussis (Tdap, Td) vaccine. You may need a Td booster every 10 years.  Zoster vaccine. You may need this  after age 39.  Pneumococcal 13-valent conjugate (PCV13) vaccine. One dose is recommended after age 11.  Pneumococcal polysaccharide (PPSV23) vaccine. One dose is recommended after age  51. Talk to your health care provider about which screenings and vaccines you need and how often you need them. This information is not intended to replace advice given to you by your health care provider. Make sure you discuss any questions you have with your health care provider. Document Released: 03/16/2015 Document Revised: 11/07/2015 Document Reviewed: 12/19/2014 Elsevier Interactive Patient Education  2017 [employer] Prevention in the Home Falls can cause injuries. They can happen to people of all ages. There are many things you can do to make your home safe and to help prevent falls. What can I do on the outside of my home?  Regularly fix the edges of walkways and driveways and fix any cracks.  Remove anything that might make you trip as you walk through a door, such as a raised step or threshold.  Trim any bushes or trees on the path to your home.  Use bright outdoor lighting.  Clear any walking paths of anything that might make someone trip, such as rocks or tools.  Regularly check to see if handrails are loose or broken. Make sure that both sides of any steps have handrails.  Any raised decks and porches should have guardrails on the edges.  Have any leaves, snow, or ice cleared regularly.  Use sand or salt on walking paths during winter.  Clean up any spills in your garage right away. This includes oil or grease spills. What can I do in the bathroom?  Use night lights.  Install grab bars by the toilet and in the tub and shower. Do not use towel bars as grab bars.  Use non-skid mats or decals in the tub or shower.  If you need to sit down in the shower, use a plastic, non-slip stool.  Keep the floor dry. Clean up any water that spills on the floor as soon as it happens.  Remove soap buildup in the tub or shower regularly.  Attach bath mats securely with double-sided non-slip rug tape.  Do not have throw rugs and other things on the floor that can make  you trip. What can I do in the bedroom?  Use night lights.  Make sure that you have a light by your bed that is easy to reach.  Do not use any sheets or blankets that are too big for your bed. They should not hang down onto the floor.  Have a firm chair that has side arms. You can use this for support while you get dressed.  Do not have throw rugs and other things on the floor that can make you trip. What can I do in the kitchen?  Clean up any spills right away.  Avoid walking on wet floors.  Keep items that you use a lot in easy-to-reach places.  If you need to reach something above you, use a strong step stool that has a grab bar.  Keep electrical cords out of the way.  Do not use floor polish or wax that makes floors slippery. If you must use wax, use non-skid floor wax.  Do not have throw rugs and other things on the floor that can make you trip. What can I do with my stairs?  Do not leave any items on the stairs.  Make sure that there are handrails on both sides of the  stairs and use them. Fix handrails that are broken or loose. Make sure that handrails are as long as the stairways.  Check any carpeting to make sure that it is firmly attached to the stairs. Fix any carpet that is loose or worn.  Avoid having throw rugs at the top or bottom of the stairs. If you do have throw rugs, attach them to the floor with carpet tape.  Make sure that you have a light switch at the top of the stairs and the bottom of the stairs. If you do not have them, ask someone to add them for you. What else can I do to help prevent falls?  Wear shoes that:  Do not have high heels.  Have rubber bottoms.  Are comfortable and fit you well.  Are closed at the toe. Do not wear sandals.  If you use a stepladder:  Make sure that it is fully opened. Do not climb a closed stepladder.  Make sure that both sides of the stepladder are locked into place.  Ask someone to hold it for you, if  possible.  Clearly mark and make sure that you can see:  Any grab bars or handrails.  First and last steps.  Where the edge of each step is.  Use tools that help you move around (mobility aids) if they are needed. These include:  Canes.  Walkers.  Scooters.  Crutches.  Turn on the lights when you go into a dark area. Replace any light bulbs as soon as they burn out.  Set up your furniture so you have a clear path. Avoid moving your furniture around.  If any of your floors are uneven, fix them.  If there are any pets around you, be aware of where they are.  Review your medicines with your doctor. Some medicines can make you feel dizzy. This can increase your chance of falling. Ask your doctor what other things that you can do to help prevent falls. This information is not intended to replace advice given to you by your health care provider. Make sure you discuss any questions you have with your health care provider. Document Released: 12/14/2008 Document Revised: 07/26/2015 Document Reviewed: 03/24/2014 Elsevier Interactive Patient Education  2017 [employer].

## 2019-03-07 NOTE — Progress Notes (Signed)
  Chronic Care Management   Outreach Note  12/282021 Name: Helen Richardson MRN: [id number] DOB: 06/14/1933  Referred by: Dorothyann Peng, MD Reason for referral : Chronic Care Management   An unsuccessful telephone outreach was attempted today. The patient was referred to the case management team by for assistance with care management and care coordination.   Follow Up Plan: A HIPPA compliant phone message was left for the patient providing contact information and requesting a return call.  The care management team will reach out to the patient again over the next 10 days.     SIGNATURE [name], PharmD, BCPS Clinical Pharmacist, [facility]  [facility]  Direct Dial: [phone]

## 2019-03-10 ENCOUNTER — Telehealth: Payer: Medicare Other

## 2019-03-10 ENCOUNTER — Other Ambulatory Visit: Payer: Self-pay | Admitting: Internal Medicine

## 2019-04-05 ENCOUNTER — Other Ambulatory Visit: Payer: Self-pay | Admitting: Internal Medicine

## 2019-04-06 ENCOUNTER — Telehealth: Payer: Self-pay

## 2019-04-06 NOTE — Telephone Encounter (Signed)
The pt was asked did she request a Helen Richardson from a [place] medical supply.  The pt said that she didn't know what a Helen Richardson was.

## 2019-04-12 ENCOUNTER — Other Ambulatory Visit: Payer: Self-pay

## 2019-04-12 ENCOUNTER — Ambulatory Visit: Payer: Medicare PPO | Admitting: Podiatry

## 2019-04-12 ENCOUNTER — Encounter: Payer: Self-pay | Admitting: Podiatry

## 2019-04-12 DIAGNOSIS — B351 Tinea unguium: Secondary | ICD-10-CM | POA: Diagnosis not present

## 2019-04-12 DIAGNOSIS — M79676 Pain in unspecified toe(s): Secondary | ICD-10-CM

## 2019-04-12 DIAGNOSIS — L608 Other nail disorders: Secondary | ICD-10-CM | POA: Insufficient documentation

## 2019-04-12 DIAGNOSIS — Z794 Long term (current) use of insulin: Secondary | ICD-10-CM | POA: Diagnosis not present

## 2019-04-12 DIAGNOSIS — E1140 Type 2 diabetes mellitus with diabetic neuropathy, unspecified: Secondary | ICD-10-CM

## 2019-04-12 NOTE — Progress Notes (Signed)
Patient ID: Helen Richardson, female   DOB: 10-Nov-1933, 86 y.o.   MRN: [id number] Complaint:  Visit Type: Patient returns to my office for continued preventative foot care services. Complaint: Patient states" my nails have grown long and thick and become painful to walk and wear shoes" Patient has been diagnosed with DM with neuropathy She presents for preventative foot care services. No changes to ROS.  Patient has not been seen 6 months. Patient presents with her granddaughter.  Podiatric Exam: Vascular: dorsalis pedis and posterior tibial pulses are weakly  palpable bilateral. Capillary return is immediate. Temperature gradient is WNL. Skin turgor WNL  Sensorium: Diminished  Semmes Weinstein monofilament test. Normal tactile sensation bilaterally. Nail Exam: Pt has thick disfigured discolored nails with subungual debris noted bilateral entire nail hallux through fifth toenails Ulcer Exam: There is no evidence of ulcer or pre-ulcerative changes or infection. Orthopedic Exam: Muscle tone and strength are WNL. No limitations in general ROM. No crepitus or effusions noted. HAV  B/L.  Hammer toes . Skin: No Porokeratosis. No infection or ulcers  Diagnosis:  Tinea unguium,Pincer nails  B/L. Pain in right toe, pain in left toes  Treatment & Plan Procedures and Treatment: Consent by patient was obtained for treatment procedures. The patient understood the discussion of treatment and procedures well. All questions were answered thoroughly reviewed. Debridement of mycotic and hypertrophic toenails, 1 through 5 bilateral and clearing of subungual debris. No ulceration, no infection noted.    Return Visit-Office Procedure: Patient instructed to return to the office for a follow up visit 3 months  for continued evaluation and treatment.    [name] DPM

## 2019-04-18 ENCOUNTER — Ambulatory Visit: Payer: Medicare Other | Admitting: Internal Medicine

## 2019-04-27 DIAGNOSIS — E1165 Type 2 diabetes mellitus with hyperglycemia: Secondary | ICD-10-CM | POA: Diagnosis not present

## 2019-05-06 ENCOUNTER — Telehealth: Payer: Self-pay

## 2019-05-06 ENCOUNTER — Other Ambulatory Visit: Payer: Self-pay | Admitting: Internal Medicine

## 2019-05-09 ENCOUNTER — Other Ambulatory Visit: Payer: Self-pay

## 2019-05-09 ENCOUNTER — Other Ambulatory Visit: Payer: Medicare PPO | Admitting: Hospice

## 2019-05-09 DIAGNOSIS — Z515 Encounter for palliative care: Secondary | ICD-10-CM

## 2019-05-09 DIAGNOSIS — I509 Heart failure, unspecified: Secondary | ICD-10-CM

## 2019-05-09 NOTE — Progress Notes (Signed)
Therapist, nutritional Palliative Care Consult Note Telephone: [phone]  Fax: [phone]  PATIENT NAME: Helen Richardson DOB: 08/15/1933 MRN: [id number]  PRIMARY CARE PROVIDER:   Dorothyann Peng, MD  REFERRING PROVIDER:  Dorothyann Peng, MD [address] [place],  [place] [address]  RESPONSIBLE PARTY:Self [phone] Contact:Brandon Bambach, grandson at home [phone] Lessie Dings, caregiver [phone]   TELEHEALTH VISIT STATEMENT Due to the COVID-19 crisis, this visit was done via telephone from my office. It was initiated and consented to by this patient and/or family.  RECOMMENDATIONS/PLAN:   Advance Care Planning/Goals of Care: Telehealth Visit consisted of building trust and follow up on palliative care. Patient affirmed she is a full code. Goals of care include to maximize quality of life and symptom management.  Symptom management: Patient denies hypoglycemic/hyperglycemic events. Current A1c is  9.7; Significant improvement in blood sugar control, A1c in April '20 was 12.1, in May '20 was 10.2. She continues on [place] as ordered.  She continues  to have bedtime snack or half a sandwich. She denied edema or shortness of breath; continues on Lasix as ordered. Caregivers Dea and Raoul Pitch continue to provide her care at home. Ongoing supportive care encouraged.  Follow up: Palliative care will continue to follow patient for goals of care clarification and symptom management  I spent 25 minutes providing this consultation. More than 50% of the time in this consultation was spent on coordinating communication  HISTORY OF PRESENT ILLNESS:Helen H Chavisis a 86 y.o.year oldfemalewith multiple medical problems including DMT2, CHF, HTN. Palliative Care was asked to help address goals of care.   CODE STATUS: Full  PPS: 50% HOSPICE ELIGIBILITY/DIAGNOSIS: TBD  PAST MEDICAL  HISTORY:  Past Medical History:  Diagnosis Date  . CHF (congestive heart failure) (HCC)   . Diabetes mellitus without complication (HCC)   . High cholesterol   . HTN (hypertension)   . Obesity     SOCIAL HX:  Social History   Tobacco Use  . Smoking status: Never Smoker  . Smokeless tobacco: Never Used  Substance Use Topics  . Alcohol use: No    ALLERGIES:  Allergies  Allergen Reactions  . Tradjenta [Linagliptin] Swelling     PERTINENT MEDICATIONS:  Outpatient Encounter Medications as of 05/09/2019  Medication Sig  . ADVOCATE LANCETS MISC   . Alcohol Swabs (ALCOHOL PREP) 70 % PADS   . aspirin 81 MG chewable tablet Chew 81 mg by mouth at bedtime.  . Calcium Carbonate-Vitamin D (CALTRATE 600+D PO) Take 1 tablet by mouth daily.  . carvedilol (COREG) 6.25 MG tablet TAKE 1 TABLET BY MOUTH TWICE DAILY  . COMFORT EZ PEN NEEDLES 32G X 4 MM MISC   . diclofenac sodium (VOLTAREN) 1 % GEL APPLY TO AFFECTED AREA 3 TIMES DAILY AS NEEDED (Patient taking differently: Apply 2 g topically 3 (three) times daily as needed (pain). )  . furosemide (LASIX) 40 MG tablet Take 1 tablet (40 mg total) by mouth daily.  [name] Kitchen glucose blood test strip Test up to 4 times daily  . Lancet Devices (ADVOCATE LANCING DEVICE) MISC   . polyethylene glycol powder (GLYCOLAX/MIRALAX) 17 GM/SCOOP powder polyethylene glycol 3350 17 gram/dose oral powder  MIX 1 CAPFUL IN DRINK AND TK PO 1 TO 3 TIMES DAILY PRF DAILY SOFT STOOLS  . potassium chloride SA (KLOR-CON) 20 MEQ tablet TAKE 1 TABLET BY MOUTH TWICE DAILY  . rosuvastatin (CRESTOR) 10 MG tablet Take one tablet by mouth Monday -Friday.  Do not take on Saturday or sunday  . rosuvastatin (CRESTOR) 20 MG tablet TAKE 1 TABLET BY MOUTH EVERY DAY  . telmisartan (MICARDIS) 80 MG tablet Take 1 tablet (80 mg total) by mouth daily.  . TRADJENTA 5 MG TABS tablet TK 1 T PO QD  . traMADol (ULTRAM) 50 MG tablet Take 1 tablet (50 mg total) by mouth every 6 (six) hours as needed.  .  TRESIBA FLEXTOUCH 200 UNIT/ML SOPN ADMINISTER 34 UNITS UNDER THE SKIN EVERY EVENING. MAX TITRATION- 80 UNITS  . UNABLE TO FIND lancets 30 gauge  . UNABLE TO FIND Comfort EZ Pen Needles 32 gauge x 5/32"   No facility-administered encounter medications on file as of 05/09/2019.     Teodoro Spray, NP

## 2019-05-10 ENCOUNTER — Other Ambulatory Visit: Payer: Self-pay | Admitting: Internal Medicine

## 2019-05-16 ENCOUNTER — Telehealth: Payer: Self-pay

## 2019-05-16 NOTE — Telephone Encounter (Signed)
Per RS ask pt did she request [facility]? Per pt she did not request it or anything from the company

## 2019-05-20 DIAGNOSIS — Z794 Long term (current) use of insulin: Secondary | ICD-10-CM | POA: Diagnosis not present

## 2019-05-20 DIAGNOSIS — E119 Type 2 diabetes mellitus without complications: Secondary | ICD-10-CM | POA: Diagnosis not present

## 2019-06-05 ENCOUNTER — Other Ambulatory Visit: Payer: Self-pay | Admitting: Internal Medicine

## 2019-07-04 DIAGNOSIS — H31009 Unspecified chorioretinal scars, unspecified eye: Secondary | ICD-10-CM | POA: Insufficient documentation

## 2019-07-04 DIAGNOSIS — Z794 Long term (current) use of insulin: Secondary | ICD-10-CM | POA: Insufficient documentation

## 2019-07-04 DIAGNOSIS — H43811 Vitreous degeneration, right eye: Secondary | ICD-10-CM | POA: Insufficient documentation

## 2019-07-04 DIAGNOSIS — E113553 Type 2 diabetes mellitus with stable proliferative diabetic retinopathy, bilateral: Secondary | ICD-10-CM | POA: Insufficient documentation

## 2019-07-04 DIAGNOSIS — H35363 Drusen (degenerative) of macula, bilateral: Secondary | ICD-10-CM | POA: Insufficient documentation

## 2019-07-04 DIAGNOSIS — H353132 Nonexudative age-related macular degeneration, bilateral, intermediate dry stage: Secondary | ICD-10-CM | POA: Insufficient documentation

## 2019-07-05 ENCOUNTER — Other Ambulatory Visit: Payer: Self-pay | Admitting: Internal Medicine

## 2019-07-05 ENCOUNTER — Encounter (INDEPENDENT_AMBULATORY_CARE_PROVIDER_SITE_OTHER): Payer: Self-pay | Admitting: Ophthalmology

## 2019-07-05 DIAGNOSIS — E113553 Type 2 diabetes mellitus with stable proliferative diabetic retinopathy, bilateral: Secondary | ICD-10-CM

## 2019-07-05 DIAGNOSIS — H35363 Drusen (degenerative) of macula, bilateral: Secondary | ICD-10-CM

## 2019-07-05 DIAGNOSIS — H31003 Unspecified chorioretinal scars, bilateral: Secondary | ICD-10-CM

## 2019-07-05 DIAGNOSIS — H43811 Vitreous degeneration, right eye: Secondary | ICD-10-CM

## 2019-07-05 DIAGNOSIS — H353132 Nonexudative age-related macular degeneration, bilateral, intermediate dry stage: Secondary | ICD-10-CM

## 2019-07-06 ENCOUNTER — Other Ambulatory Visit: Payer: Medicare PPO | Admitting: Hospice

## 2019-07-06 ENCOUNTER — Other Ambulatory Visit: Payer: Self-pay

## 2019-07-06 DIAGNOSIS — I509 Heart failure, unspecified: Secondary | ICD-10-CM | POA: Diagnosis not present

## 2019-07-06 DIAGNOSIS — Z515 Encounter for palliative care: Secondary | ICD-10-CM

## 2019-07-06 NOTE — Progress Notes (Signed)
[facility] Consult Note Telephone: [phone]  Fax: [phone]  PATIENT NAME: Helen Richardson DOB: [birth year] MRN: [id number]  PRIMARY CARE PROVIDER:   Glendale Chard, MD  Referring Provider: Glendale Chard, MD   RESPONSIBLE PARTY:Self [phone] Contact:Brandon Osland, grandson at home [place], caregiver [phone]- close family friend Karena Addison  [phone]-caregiver   RECOMMENDATIONS/PLAN:  Advance Care Planning/Goals of Care:  Visit consisted of building trust and follow up on palliative care.  Patient wanted to know if palliative care was working hand-in-hand with PCP.  She was assured that palliative care was in collaboration with PCP.  Discussion on patient's wish if she should stop breathing and her heart stops beating.  Patient affirmed she is a full code. Goals of care include to maximize quality of life and symptom management. Symptom management:CBG today in the morning was 114. Patient denies hypoglycemic/hyperglycemic events. Current A1c is  9.7; Significant improvement in blood sugar control, A1c in April '20 was 12.1, in May '20 was 10.2. She continues on [place] as ordered.  She continues  to have bedtime snack or half a sandwich. She has chocolate by bedside if needed per caregiver [place]. She denied edema or shortness of breath; continues on Lasix as ordered. Caregivers Dea is with patient Dorene Grebe 2 hours a day. Patient on Telmisartan and Carvidilol for HTN.  Karena Addison reported that patient gained 2 pounds in the last 1 month.  Patient with no edema no wheezing no shortness of breath.  Education provided on avoiding concentrated sweets and high caloric foods; Incorporating fruits and vegetables to meals.  Caregiver to monitor weight and call with any concerns.  Patient  continues on her Lasix as ordered.  Patient's patient in no acute distress and with no concerns; caregiver with no report of  medical acuity at this time and  no complaints.  Ongoing supportive care encouraged. Follow SW:[id number] care will continue to follow patient for goals of care clarification and symptom management  I spent1 hour and 20 minutes providing this consultation; time includes chart review and documentation. More than 50% of the time in this consultation was spent on coordinating communication  HISTORY OF PRESENT ILLNESS:Helen H Chavisis a 86 y.o.year oldfemalewith multiple medical problems including DMT2, CHF, HTN. Palliative Care was asked to help address goals of care. CODE STATUS: Full code  PPS: 50% HOSPICE ELIGIBILITY/DIAGNOSIS: TBD  PAST MEDICAL HISTORY:  Past Medical History:  Diagnosis Date  . CHF (congestive heart failure) ([place])   . Diabetes mellitus without complication ([place])   . High cholesterol   . HTN (hypertension)   . Obesity     SOCIAL HX:  Social History   Tobacco Use  . Smoking status: Never Smoker  . Smokeless tobacco: Never Used  Substance Use Topics  . Alcohol use: No    ALLERGIES:  Allergies  Allergen Reactions  . Tradjenta [Linagliptin] Swelling     PERTINENT MEDICATIONS:  Outpatient Encounter Medications as of 07/06/2019  Medication Sig  . ADVOCATE LANCETS MISC   . Alcohol Swabs (ALCOHOL PREP) 70 % PADS   . aspirin 81 MG chewable tablet Chew 81 mg by mouth at bedtime.  . Calcium Carbonate-Vitamin D (CALTRATE 600+D PO) Take 1 tablet by mouth daily.  . carvedilol (COREG) 6.25 MG tablet TAKE 1 TABLET BY MOUTH TWICE DAILY  . COMFORT EZ PEN NEEDLES 32G X 4 MM MISC   . diclofenac sodium (VOLTAREN) 1 % GEL APPLY  TO AFFECTED AREA 3 TIMES DAILY AS NEEDED (Patient taking differently: Apply 2 g topically 3 (three) times daily as needed (pain). )  . furosemide (LASIX) 40 MG tablet Take 1 tablet (40 mg total) by mouth daily.  [name] Kitchen glucose blood test strip Test up to 4 times daily  . Lancet Devices (ADVOCATE LANCING DEVICE) MISC   . polyethylene glycol  powder (GLYCOLAX/MIRALAX) 17 GM/SCOOP powder polyethylene glycol 3350 17 gram/dose oral powder  MIX 1 CAPFUL IN DRINK AND TK PO 1 TO 3 TIMES DAILY PRF DAILY SOFT STOOLS  . potassium chloride SA (KLOR-CON) 20 MEQ tablet TAKE 1 TABLET BY MOUTH TWICE DAILY  . rosuvastatin (CRESTOR) 10 MG tablet Take one tablet by mouth Monday -Friday. Do not take on Saturday or sunday  . rosuvastatin (CRESTOR) 20 MG tablet TAKE 1 TABLET BY MOUTH EVERY DAY  . telmisartan (MICARDIS) 80 MG tablet Take 1 tablet (80 mg total) by mouth daily.  . TRADJENTA 5 MG TABS tablet TK 1 T PO QD  . traMADol (ULTRAM) 50 MG tablet Take 1 tablet (50 mg total) by mouth every 6 (six) hours as needed.  . TRESIBA FLEXTOUCH 200 UNIT/ML SOPN ADMINISTER 34 UNITS UNDER THE SKIN EVERY EVENING. MAX TITRATION- 80 UNITS  . UNABLE TO FIND lancets 30 gauge  . UNABLE TO FIND Comfort EZ Pen Needles 32 gauge x 5/32"   No facility-administered encounter medications on file as of 07/06/2019.    PHYSICAL EXAM/ROS:  General: NAD, cooperative Cardiovascular: regular rate and rhythm; denies chest pain Pulmonary: clear ant/post fields; normal respiratory effort, no coughing, no shortness of breath. Abdomen: soft, nontender, + bowel sounds Extremities: no edema, no joint deformities Skin: no rashes to exposed skin Neurological: Weakness but otherwise nonfocal  Rosaura Carpenter, NP

## 2019-07-12 ENCOUNTER — Ambulatory Visit: Payer: Medicare PPO | Admitting: Podiatry

## 2019-07-12 ENCOUNTER — Other Ambulatory Visit: Payer: Self-pay

## 2019-07-12 ENCOUNTER — Encounter: Payer: Self-pay | Admitting: Podiatry

## 2019-07-12 VITALS — Temp 97.2°F

## 2019-07-12 DIAGNOSIS — E1140 Type 2 diabetes mellitus with diabetic neuropathy, unspecified: Secondary | ICD-10-CM | POA: Diagnosis not present

## 2019-07-12 DIAGNOSIS — L608 Other nail disorders: Secondary | ICD-10-CM | POA: Diagnosis not present

## 2019-07-12 DIAGNOSIS — B351 Tinea unguium: Secondary | ICD-10-CM | POA: Diagnosis not present

## 2019-07-12 DIAGNOSIS — Z794 Long term (current) use of insulin: Secondary | ICD-10-CM | POA: Diagnosis not present

## 2019-07-12 DIAGNOSIS — M79676 Pain in unspecified toe(s): Secondary | ICD-10-CM

## 2019-07-12 NOTE — Progress Notes (Signed)
This patient returns to my office for at risk foot care.  This patient requires this care by a professional since this patient will be at risk due to having diabetes type 2.  Patient is accompanied by her granddaughter.  This patient is unable to cut nails herself since the patient cannot reach her nails.These nails are painful walking and wearing shoes.  This patient presents for at risk foot care today.  General Appearance  Alert, conversant and in no acute stress.  Vascular  Dorsalis pedis and posterior tibial  pulses are weakly  palpable  bilaterally.  Capillary return is within normal limits  bilaterally. Temperature is within normal limits  bilaterally.  Neurologic  Senn-Weinstein monofilament wire test diminished  bilaterally. Muscle power within normal limits bilaterally.  Nails Thick disfigured discolored nails with subungual debris  from hallux to fifth toes bilaterally. No evidence of bacterial infection or drainage bilaterally.  Orthopedic  No limitations of motion  feet .  No crepitus or effusions noted.  HAV  B/L.  Hammer toes  B/L.  Skin  normotropic skin with no porokeratosis noted bilaterally.  No signs of infections or ulcers noted.     Onychomycosis  Pain in right toes  Pain in left toes  Consent was obtained for treatment procedures.   Mechanical debridement of nails 1-5  bilaterally performed with a nail nipper.  Filed with dremel without incident.    Return office visit    3 months                  Told patient to return for periodic foot care and evaluation due to potential at risk complications.   [name] DPM

## 2019-07-14 ENCOUNTER — Telehealth: Payer: Self-pay

## 2019-07-14 NOTE — Telephone Encounter (Signed)
Left vm for pt to return call.  Need to know if patient requested to have a handheld tx wand for scalp. Also need to know who diagnosed with psoriasis.

## 2019-07-25 DIAGNOSIS — E1165 Type 2 diabetes mellitus with hyperglycemia: Secondary | ICD-10-CM | POA: Diagnosis not present

## 2019-07-27 ENCOUNTER — Telehealth: Payer: Self-pay

## 2019-08-04 ENCOUNTER — Other Ambulatory Visit: Payer: Self-pay | Admitting: Internal Medicine

## 2019-08-16 ENCOUNTER — Telehealth: Payer: Self-pay

## 2019-08-16 NOTE — Telephone Encounter (Signed)
Message left for [facility] to change upcoming 6/23 appt with palliative care NP

## 2019-08-17 ENCOUNTER — Telehealth: Payer: Self-pay

## 2019-08-17 NOTE — Telephone Encounter (Signed)
Called and left caregiver [name] 2 messages to reschedule palliative care visit for 6/23.  6/23 visit cancelled, will await callback from caregiver to reschedule.

## 2019-08-19 DIAGNOSIS — E119 Type 2 diabetes mellitus without complications: Secondary | ICD-10-CM | POA: Diagnosis not present

## 2019-08-19 DIAGNOSIS — Z794 Long term (current) use of insulin: Secondary | ICD-10-CM | POA: Diagnosis not present

## 2019-08-23 ENCOUNTER — Telehealth: Payer: Self-pay

## 2019-08-23 NOTE — Telephone Encounter (Signed)
The pt's granddaughter Morrie Sheldon called and said that the pt declined physical therapy and told the company that she didn't need it when she actually does and that the family wanted to know if the referral can be made again and that Morrie Sheldon be contacted from the company instead of the patient so that she can have the physical therapy because she is unable to hardly walk around.

## 2019-08-24 NOTE — Telephone Encounter (Signed)
Helen Richardson said no she hasn't called but that she will call in the morning to see if she can get the physical therapy restarted.

## 2019-08-24 NOTE — Telephone Encounter (Signed)
Has Helen Richardson called the company already to reinstate the therapy?

## 2019-08-30 ENCOUNTER — Telehealth: Payer: Self-pay

## 2019-08-30 ENCOUNTER — Ambulatory Visit (INDEPENDENT_AMBULATORY_CARE_PROVIDER_SITE_OTHER): Payer: Medicare PPO

## 2019-08-30 ENCOUNTER — Other Ambulatory Visit: Payer: Self-pay

## 2019-08-30 DIAGNOSIS — N183 Chronic kidney disease, stage 3 unspecified: Secondary | ICD-10-CM | POA: Diagnosis not present

## 2019-08-30 DIAGNOSIS — I509 Heart failure, unspecified: Secondary | ICD-10-CM | POA: Diagnosis not present

## 2019-08-30 DIAGNOSIS — Z794 Long term (current) use of insulin: Secondary | ICD-10-CM | POA: Diagnosis not present

## 2019-08-30 DIAGNOSIS — I10 Essential (primary) hypertension: Secondary | ICD-10-CM | POA: Diagnosis not present

## 2019-08-30 DIAGNOSIS — E1122 Type 2 diabetes mellitus with diabetic chronic kidney disease: Secondary | ICD-10-CM

## 2019-08-30 NOTE — Telephone Encounter (Signed)
I returned a call from [facility] about the pt having some swelling and that the pt's diet hasn't changed and that she thinks that it's fluid and to call ashley the granddaughter to schedule the pt an appt.  Morrie Sheldon was notified that Dr.Sanders is going to send out [facility] to evaluate the patient and for Morrie Sheldon to mention that the pt needs physical therapy.

## 2019-09-02 NOTE — Patient Instructions (Signed)
Visit Information  Goals Addressed      Patient Stated   .  COMPLETED: I don't know if insulin regimen is working (pt-stated)        Current Barriers:  [name] Kitchen Knowledge Deficits related to managing diabetes  Pharmacist Clinical Goal(s):  [name] Kitchen Over the next 90 days, patient will demonstrate Improved medication adherence as evidenced by BGs within range, FBG<130, no hypoglycemia . Over the next 90 days, patient will work with CCM team to address needs related to managing diabetes 02/03/19: re-established target goal date to 90 days due to treatment delays secondary to COVID-19  CCM RN CM Interventions: 01/25/19 call completed with patient and granddaughter Morrie Sheldon   . Evaluation of current treatment plan related to Diabetes and patient's adherence to plan as established by provider. . Provided education to patient re: the importance of eating a healthy bedtime snack to help avoid hypoglycemic events while sleeping; instructed patient to eat 1/2 peanut butter sandwich at bedtime and to continue to monitor her am blood sugars  . Reviewed medications with patient and discussed patient is taking [place] as directed; 34 units at bedtime . Collaborated with embedded Pharm D and PCP regarding patient's reported hypoglycemic events, with several readings between 60-68 and the instructions given to patient to eat 1/2 peanut butter sandwich at bedtime and to continue to monitor her CBGs and record . Discussed plans with patient for ongoing care management follow up and provided patient with direct contact information for care management team . Advised patient, providing education and rationale, to check cbg before meals daily and record, calling CCM team and PCP for findings outside established parameters.    CCM PharmD Interventions: 02/03/19 call completed with patient  . Patient states he Bgs have improved.  She is starting to eat more and denies hypoglycemia (Bg<70) since last CCM RN CM call on  01/25/19. [name] Kitchen Consider reduction of Tresiba 34 units to [place] 30 units if BGs fall again or A1c continues to decline. Next A1c follow up on 02/22/19 at PCP office . Reinforced CCM RN CM instructions above  Patient Self Care Activities:  . Currently UNABLE TO independently manage diabetes; chronic conditions  Please see past updates related to this goal by clicking on the "Past Updates" button in the selected goal      .  COMPLETED: My test strips are too expensive (pt-stated)        Current Barriers:  . Financial Barriers-needs RX for test strips  Pharmacist Clinical Goal(s):  [name] Kitchen Over the next 30 days, patient will work with PharmD & PCP to address needs related to test strip & insulin affordability  Interventions: . Collaboration with provider re: medication management  . Patient using One Touch Verio glucometer--CMA assisted with sending new RX to be covered under patient's insurance.  Pharmacy states they have not received.  Will request another RX. [name] Kitchen Patient's grandson & patient are agreeable to participate in patient assistance program for [place]-B[place] (pens) through [employer].  Application was discussed, prepared and mailed today. . Encouraged family to reach out if questions arise. . Will continue to follow  Patient Self Care Activities:  With help from caregivers . Self administers medications as prescribed . Attends all scheduled provider appointments . Calls pharmacy for medication refills  Please see past updates related to this goal by clicking on the "Past Updates" button in the selected goal        Other   .  "I think my grandmother has CHF"  CARE PLAN ENTRY (see longitudinal plan of care for additional care plan information  Current Barriers:  [name] Kitchen Knowledge Deficits related to disease process and Self Health management of CHF . Chronic Disease Management support and education needs related to DM, HTN, s/p fall, Medication Management, CHF  Nurse Case Manager  Clinical Goal(s):  [name] Kitchen New 08/31/19 Over the next 90 days, patient and caregiver will work with the CCM team and PCP to improve Self Health management of CHF as evidence by patient will experience having no CHF exacerbations or hospitalizations   CCM RN CM Interventions:  08/31/19 completed call with granddaughter Morrie Sheldon  . Evaluation of current treatment plan related to CHF and patient's adherence to plan as established by provider . Determined granddaughter Morrie Sheldon has noticed increased edema to her grandmother's lower extremities, her weight has not changed, her grandmother is not as active as before, she notified Dr. Allyne Gee . Determined Dr. Allyne Gee recommended a referral to [facility] and the patient is agreeable . Education provided to [employer] regarding [employer] and the services they provide; provided her with the contact number and discussed if a call from [facility] has not been received by the end of the week, Morrie Sheldon will follow up . Advised patient, providing education and rationale, to weigh daily and record, calling PCP office and or CCM team for weight gain of 3lbs overnight or 5 pounds in a week . Reiterated the importance of having patient follow a strict low Sodium diet to help avoid fluid retention and elevated BP . Determined patient's am CNA [facility]) is unavailable in the afternoon and more caregiver assistance is needed for the patient to help prepare dinner and get her settled for bedtime . Determined the family would like to privately pay for a CNA but would like to work with the embedded BSW to obtain local resources for which they can hire a safe reliable licensed CNA  . Collaborated with embedded BSW Bevelyn Ngo via in basket message regarding the family's request for assistance with locating a licensed CNA . Discussed plans with patient for ongoing care management follow up and provided patient with direct contact information for care management team  Patient Self Care  Activities:  Patient is currently UNABLE TO independently provide self-care or self administer medications  Please see past updates related to this goal by clicking on the "Past Updates" button in the selected goal        Patient verbalizes understanding of instructions provided today.   Telephone follow up appointment with care management team member scheduled for: 09/07/19  Delsa Sale, RN, BSN, CCM Care Management Coordinator [facility] Care Management/[facility]  Direct Phone: [phone]

## 2019-09-02 NOTE — Chronic Care Management (AMB) (Signed)
Chronic Care Management   Follow Up Note   08/31/2019 Name: LILIANNA CASE MRN: [id number] DOB: [birth year]  Referred by: Dorothyann Peng, MD Reason for referral : Chronic Care Management (FU RN CM Call )   RUMAISA SCHNETZER is a 86 y.o. year old female who is a primary care patient of Dorothyann Peng, MD. The CCM team was consulted for assistance with chronic disease management and care coordination needs.    Review of patient status, including review of consultants reports, relevant laboratory and other test results, and collaboration with appropriate care team members and the patient's provider was performed as part of comprehensive patient evaluation and provision of chronic care management services.    SDOH (Social Determinants of Health) assessments performed: Yes - caregiver assistance See Care Plan activities for detailed interventions related to SDOH)   Placed outbound call to granddaughter Morrie Sheldon for a CCM RN CM care plan update.     Outpatient Encounter Medications as of 08/30/2019  Medication Sig Note  . ADVOCATE LANCETS MISC  03/10/2013: Received from: External Pharmacy  . Alcohol Swabs (ALCOHOL PREP) 70 % PADS  03/10/2013: Received from: External Pharmacy  . aspirin 81 MG chewable tablet Chew 81 mg by mouth at bedtime.   . Calcium Carbonate-Vitamin D (CALTRATE 600+D PO) Take 1 tablet by mouth daily.   . carvedilol (COREG) 6.25 MG tablet TAKE 1 TABLET BY MOUTH TWICE DAILY   . COMFORT EZ PEN NEEDLES 32G X 4 MM MISC    . diclofenac sodium (VOLTAREN) 1 % GEL APPLY TO AFFECTED AREA 3 TIMES DAILY AS NEEDED (Patient taking differently: Apply 2 g topically 3 (three) times daily as needed (pain). )   . furosemide (LASIX) 40 MG tablet TAKE 1 TABLET(40 MG) BY MOUTH DAILY   . glucose blood test strip Test up to 4 times daily   . Lancet Devices (ADVOCATE LANCING DEVICE) MISC  03/10/2013: Received from: External Pharmacy  . polyethylene glycol powder (GLYCOLAX/MIRALAX) 17 GM/SCOOP powder  polyethylene glycol 3350 17 gram/dose oral powder  MIX 1 CAPFUL IN DRINK AND TK PO 1 TO 3 TIMES DAILY PRF DAILY SOFT STOOLS   . potassium chloride SA (KLOR-CON) 20 MEQ tablet TAKE 1 TABLET BY MOUTH TWICE DAILY   . rosuvastatin (CRESTOR) 10 MG tablet Take one tablet by mouth Monday -Friday. Do not take on Saturday or sunday   . rosuvastatin (CRESTOR) 20 MG tablet TAKE 1 TABLET BY MOUTH EVERY DAY   . telmisartan (MICARDIS) 80 MG tablet TAKE 1 TABLET(80 MG) BY MOUTH DAILY   . TRADJENTA 5 MG TABS tablet TK 1 T PO QD   . TRESIBA FLEXTOUCH 200 UNIT/ML SOPN ADMINISTER 34 UNITS UNDER THE SKIN EVERY EVENING. MAX TITRATION- 80 UNITS   . UNABLE TO FIND lancets 30 gauge   . UNABLE TO FIND Comfort EZ Pen Needles 32 gauge x 5/32"    No facility-administered encounter medications on file as of 08/30/2019.     Objective:  Lab Results  Component Value Date   HGBA1C 9.7 (H) 12/21/2018   HGBA1C 10.2 (H) 07/12/2018   HGBA1C 12.1 (H) 06/11/2018   Lab Results  Component Value Date   LDLCALC 70 12/21/2018   CREATININE 0.78 02/22/2019   BP Readings from Last 3 Encounters:  12/14/18 140/72  07/12/18 120/84  07/02/18 (!) 130/58    Goals Addressed      Patient Stated   .  COMPLETED: I don't know if insulin regimen is working (pt-stated)  Current Barriers:  [name] Kitchen Knowledge Deficits related to managing diabetes  Pharmacist Clinical Goal(s):  [name] Kitchen Over the next 90 days, patient will demonstrate Improved medication adherence as evidenced by BGs within range, FBG<130, no hypoglycemia . Over the next 90 days, patient will work with CCM team to address needs related to managing diabetes 02/03/19: re-established target goal date to 90 days due to treatment delays secondary to COVID-19  CCM RN CM Interventions: 01/25/19 call completed with patient and granddaughter Morrie Sheldon   . Evaluation of current treatment plan related to Diabetes and patient's adherence to plan as established by provider. . Provided  education to patient re: the importance of eating a healthy bedtime snack to help avoid hypoglycemic events while sleeping; instructed patient to eat 1/2 peanut butter sandwich at bedtime and to continue to monitor her am blood sugars  . Reviewed medications with patient and discussed patient is taking [place] as directed; 34 units at bedtime . Collaborated with embedded Pharm D and PCP regarding patient's reported hypoglycemic events, with several readings between 60-68 and the instructions given to patient to eat 1/2 peanut butter sandwich at bedtime and to continue to monitor her CBGs and record . Discussed plans with patient for ongoing care management follow up and provided patient with direct contact information for care management team . Advised patient, providing education and rationale, to check cbg before meals daily and record, calling CCM team and PCP for findings outside established parameters.    CCM PharmD Interventions: 02/03/19 call completed with patient  . Patient states he Bgs have improved.  She is starting to eat more and denies hypoglycemia (Bg<70) since last CCM RN CM call on 01/25/19. [name] Kitchen Consider reduction of Tresiba 34 units to [place] 30 units if BGs fall again or A1c continues to decline. Next A1c follow up on 02/22/19 at PCP office . Reinforced CCM RN CM instructions above  Patient Self Care Activities:  . Currently UNABLE TO independently manage diabetes; chronic conditions  Please see past updates related to this goal by clicking on the "Past Updates" button in the selected goal      .  COMPLETED: My test strips are too expensive (pt-stated)        Current Barriers:  . Financial Barriers-needs RX for test strips  Pharmacist Clinical Goal(s):  [name] Kitchen Over the next 30 days, patient will work with PharmD & PCP to address needs related to test strip & insulin affordability  Interventions: . Collaboration with provider re: medication management  . Patient using One Touch  Verio glucometer--CMA assisted with sending new RX to be covered under patient's insurance.  Pharmacy states they have not received.  Will request another RX. [name] Kitchen Patient's grandson & patient are agreeable to participate in patient assistance program for [place] (pens) through [employer].  Application was discussed, prepared and mailed today. . Encouraged family to reach out if questions arise. . Will continue to follow  Patient Self Care Activities:  With help from caregivers . Self administers medications as prescribed . Attends all scheduled provider appointments . Calls pharmacy for medication refills  Please see past updates related to this goal by clicking on the "Past Updates" button in the selected goal        Other   .  "I think my grandmother has CHF"        CARE PLAN ENTRY (see longitudinal plan of care for additional care plan information  Current Barriers:  [name] Kitchen Knowledge Deficits related to disease process and Self Health  management of CHF . Chronic Disease Management support and education needs related to DM, HTN, s/p fall, Medication Management, CHF  Nurse Case Manager Clinical Goal(s):  [name] Kitchen New 08/31/19 Over the next 90 days, patient and caregiver will work with the CCM team and PCP to improve Self Health management of CHF as evidence by patient will experience having no CHF exacerbations or hospitalizations   CCM RN CM Interventions:  08/31/19 completed call with granddaughter Morrie Sheldon  . Evaluation of current treatment plan related to CHF and patient's adherence to plan as established by provider . Determined granddaughter Morrie Sheldon has noticed increased edema to her grandmother's lower extremities, her weight has not changed, her grandmother is not as active as before, she notified Dr. Allyne Gee . Determined Dr. Allyne Gee recommended a referral to [facility] and the patient is agreeable . Education provided to [employer] regarding [employer] and the services they provide;  provided her with the contact number and discussed if a call from [facility] has not been received by the end of the week, Morrie Sheldon will follow up . Advised patient, providing education and rationale, to weigh daily and record, calling PCP office and or CCM team for weight gain of 3lbs overnight or 5 pounds in a week . Reiterated the importance of having patient follow a strict low Sodium diet to help avoid fluid retention and elevated BP . Determined patient's am CNA [facility]) is unavailable in the afternoon and more caregiver assistance is needed for the patient to help prepare dinner and get her settled for bedtime . Determined the family would like to privately pay for a CNA but would like to work with the embedded BSW to obtain local resources for which they can hire a safe reliable licensed CNA  . Collaborated with embedded BSW Bevelyn Ngo via in basket message regarding the family's request for assistance with locating a licensed CNA . Discussed plans with patient for ongoing care management follow up and provided patient with direct contact information for care management team  Patient Self Care Activities:  Patient is currently UNABLE TO independently provide self-care or self administer medications  Please see past updates related to this goal by clicking on the "Past Updates" button in the selected goal        Plan:   Telephone follow up appointment with care management team member scheduled for: 09/07/19  Delsa Sale, RN, BSN, CCM Care Management Coordinator [facility] Care Management/[facility]  Direct Phone: [phone]

## 2019-09-03 ENCOUNTER — Other Ambulatory Visit: Payer: Self-pay | Admitting: Internal Medicine

## 2019-09-06 ENCOUNTER — Telehealth: Payer: Medicare PPO

## 2019-09-06 ENCOUNTER — Other Ambulatory Visit: Payer: Self-pay

## 2019-09-06 ENCOUNTER — Ambulatory Visit (INDEPENDENT_AMBULATORY_CARE_PROVIDER_SITE_OTHER): Payer: Medicare PPO

## 2019-09-06 DIAGNOSIS — I10 Essential (primary) hypertension: Secondary | ICD-10-CM | POA: Diagnosis not present

## 2019-09-06 DIAGNOSIS — I509 Heart failure, unspecified: Secondary | ICD-10-CM | POA: Diagnosis not present

## 2019-09-06 DIAGNOSIS — E1122 Type 2 diabetes mellitus with diabetic chronic kidney disease: Secondary | ICD-10-CM

## 2019-09-06 DIAGNOSIS — I129 Hypertensive chronic kidney disease with stage 1 through stage 4 chronic kidney disease, or unspecified chronic kidney disease: Secondary | ICD-10-CM | POA: Diagnosis not present

## 2019-09-06 DIAGNOSIS — Z794 Long term (current) use of insulin: Secondary | ICD-10-CM | POA: Diagnosis not present

## 2019-09-06 DIAGNOSIS — N183 Chronic kidney disease, stage 3 unspecified: Secondary | ICD-10-CM | POA: Diagnosis not present

## 2019-09-07 ENCOUNTER — Telehealth: Payer: Medicare PPO

## 2019-09-07 DIAGNOSIS — E871 Hypo-osmolality and hyponatremia: Secondary | ICD-10-CM | POA: Diagnosis not present

## 2019-09-07 DIAGNOSIS — E1122 Type 2 diabetes mellitus with diabetic chronic kidney disease: Secondary | ICD-10-CM | POA: Diagnosis not present

## 2019-09-07 DIAGNOSIS — E113553 Type 2 diabetes mellitus with stable proliferative diabetic retinopathy, bilateral: Secondary | ICD-10-CM | POA: Diagnosis not present

## 2019-09-07 DIAGNOSIS — D649 Anemia, unspecified: Secondary | ICD-10-CM | POA: Diagnosis not present

## 2019-09-07 DIAGNOSIS — E875 Hyperkalemia: Secondary | ICD-10-CM | POA: Diagnosis not present

## 2019-09-07 DIAGNOSIS — I10 Essential (primary) hypertension: Secondary | ICD-10-CM | POA: Diagnosis not present

## 2019-09-07 NOTE — Chronic Care Management (AMB) (Signed)
Chronic Care Management   Follow Up Note   09/06/2019 Name: Helen Richardson MRN: [id number] DOB: Jul 09, 1933  Referred by: Glendale Chard, MD Reason for referral : Chronic Care Management (FU RN CM Call )   Helen Richardson is a 86 y.o. year old female who is a primary care patient of Glendale Chard, MD. The CCM team was consulted for assistance with chronic disease management and care coordination needs.    Review of patient status, including review of consultants reports, relevant laboratory and other test results, and collaboration with appropriate care team members and the patient's provider was performed as part of comprehensive patient evaluation and provision of chronic care management services.    SDOH (Social Determinants of Health) assessments performed: Yes - caregiver assistance See Care Plan activities for detailed interventions related to [place])   Placed outbound CCM RN CM call to granddaughter Caryl Pina to f/u on [facility].     Outpatient Encounter Medications as of 09/06/2019  Medication Sig Note  . ADVOCATE LANCETS MISC  03/10/2013: Received from: External Pharmacy  . Alcohol Swabs (ALCOHOL PREP) 70 % PADS  03/10/2013: Received from: External Pharmacy  . aspirin 81 MG chewable tablet Chew 81 mg by mouth at bedtime.   . Calcium Carbonate-Vitamin D (CALTRATE 600+D PO) Take 1 tablet by mouth daily.   . carvedilol (COREG) 6.25 MG tablet TAKE 1 TABLET BY MOUTH TWICE DAILY   . COMFORT EZ PEN NEEDLES 32G X 4 MM MISC    . diclofenac sodium (VOLTAREN) 1 % GEL APPLY TO AFFECTED AREA 3 TIMES DAILY AS NEEDED (Patient taking differently: Apply 2 g topically 3 (three) times daily as needed (pain). )   . furosemide (LASIX) 40 MG tablet TAKE 1 TABLET(40 MG) BY MOUTH DAILY   . glucose blood test strip Test up to 4 times daily   . Lancet Devices (ADVOCATE LANCING DEVICE) MISC  03/10/2013: Received from: External Pharmacy  . polyethylene glycol powder (GLYCOLAX/MIRALAX) 17 GM/SCOOP powder  polyethylene glycol 3350 17 gram/dose oral powder  MIX 1 CAPFUL IN DRINK AND TK PO 1 TO 3 TIMES DAILY PRF DAILY SOFT STOOLS   . potassium chloride SA (KLOR-CON) 20 MEQ tablet TAKE 1 TABLET BY MOUTH TWICE DAILY   . rosuvastatin (CRESTOR) 10 MG tablet Take one tablet by mouth Monday -Friday. Do not take on Saturday or sunday   . rosuvastatin (CRESTOR) 20 MG tablet TAKE 1 TABLET BY MOUTH EVERY DAY   . telmisartan (MICARDIS) 80 MG tablet TAKE 1 TABLET(80 MG) BY MOUTH DAILY   . TRADJENTA 5 MG TABS tablet TK 1 T PO QD   . TRESIBA FLEXTOUCH 200 UNIT/ML SOPN ADMINISTER 34 UNITS UNDER THE SKIN EVERY EVENING. MAX TITRATION- 80 UNITS   . UNABLE TO FIND lancets 30 gauge   . UNABLE TO FIND Comfort EZ Pen Needles 32 gauge x 5/32"    No facility-administered encounter medications on file as of 09/06/2019.     Objective:  Lab Results  Component Value Date   HGBA1C 9.7 (H) 12/21/2018   HGBA1C 10.2 (H) 07/12/2018   HGBA1C 12.1 (H) 06/11/2018   Lab Results  Component Value Date   LDLCALC 70 12/21/2018   CREATININE 0.78 02/22/2019   BP Readings from Last 3 Encounters:  12/14/18 140/72  07/12/18 120/84  07/02/18 (!) 130/58    Goals Addressed      Patient Stated   .  "I am having more arthritis pain in my arms and legs" (pt-stated)   Not  on track     Current Barriers:  . Poor adherence to following a HEP . Osteoarthritis  . Impaired Physical Mobility   Nurse Case Manager Clinical Goal(s):  [name] Kitchen Over the next 60 days, patient will verbalize understanding of plan for resumption of in home PT to evaluate and treat impaired physical mobility, poor gait, decreased ROM  Goal Met  New 08/31/19 Over the next 30 days, patient will start in home PT/OT through [facility] to improve her strength, balance, stamina and mobility.   CCM RN CM Interventions:  08/31/19 completed call with granddaughter Caryl Pina  . Evaluation of current treatment plan related to Osteoarthritis pain and Impaired Physical Mobility  and patient's adherence to plan as established by provider . Determined granddaughter Caryl Pina has noticed increased edema to her grandmother's lower extremities, her weight has not changed, her grandmother is not as active as before resulting in increased pain and stiffness, she notified Dr. Baird Cancer . Determined Dr. Baird Cancer recommended a referral to [facility] and the patient is agreeable . Education provided to [employer] and the services they provide; including PT/OT services . Discussed granddaughter Caryl Pina will inform the lead nurse admitting her grandmother into this service of her concerns related to patient's decreased mobility and request for PT/OT . Discussed plans with patient for ongoing care management follow up and provided patient with direct contact information for care management team 09/06/19 call completed with granddaughter Caryl Pina  . Collaborated with PCP regarding referral for [facility], received notification this referral and pertinent clinical documentation has been sent to [facility]  . Determined Caryl Pina will contact [facility] if not contacted by their intake department over the next few business days . Provided Caryl Pina with the contact information for [facility]  . Discussed plans with patient for ongoing care management follow up and provided patient with direct contact information for care management team  Patient Self Care Activities:  . Patient is currently UNABLE TO independently provide self-care or self administer medications  Please see past updates related to this goal by clicking on the "Past Updates" button in the selected goal        Other   .  "I think my grandmother has CHF"   On track     [employer] (see longitudinal plan of care for additional care plan information  Current Barriers:  [name] Kitchen Knowledge Deficits related to disease process and Self Health management of CHF . Chronic Disease Management support and  education needs related to DM, HTN, s/p fall, Medication Management, CHF  Nurse Case Manager Clinical Goal(s):  [name] Kitchen New 08/31/19 Over the next 90 days, patient and caregiver will work with the CCM team and PCP to improve Self Health management of CHF as evidence by patient will experience having no CHF exacerbations or hospitalizations   CCM RN CM Interventions:  08/31/19 completed call with granddaughter Caryl Pina  . Evaluation of current treatment plan related to CHF and patient's adherence to plan as established by provider . Determined granddaughter Caryl Pina has noticed increased edema to her grandmother's lower extremities, her weight has not changed, her grandmother is not as active as before, she notified Dr. Baird Cancer . Determined Dr. Baird Cancer recommended a referral to [facility] and the patient is agreeable . Education provided to [employer] regarding [employer] and the services they provide; provided her with the contact number and discussed if a call from [place] has not been received by the end of the week, Caryl Pina will  follow up . Advised patient, providing education and rationale, to weigh daily and record, calling PCP office and or CCM team for weight gain of 3lbs overnight or 5 pounds in a week . Reiterated the importance of having patient follow a strict low Sodium diet to help avoid fluid retention and elevated BP . Determined patient's am CNA [facility] is unavailable in the afternoon and more caregiver assistance is needed for the patient to help prepare dinner and get her settled for bedtime . Determined the family would like to privately pay for a CNA but would like to work with the embedded BSW to obtain local resources for which they can hire a safe reliable licensed CNA  . Collaborated with embedded BSW Daneen Schick via in basket message regarding the family's request for assistance with locating a licensed CNA . Discussed plans with patient for ongoing care management follow up and  provided patient with direct contact information for care management team 09/06/19 call completed with granddaughter Caryl Pina  . Collaborated with PCP regarding referral for [facility], received notification this referral and pertinent clinical documentation has been sent to [facility]  . Determined Caryl Pina will contact [facility] if not contacted by their intake department over the next few business days . Provided Caryl Pina with the contact information for [facility]  . Discussed plans with patient for ongoing care management follow up and provided patient with direct contact information for care management team  Patient Self Care Activities:  Patient is currently UNABLE TO independently provide self-care or self administer medications  Please see past updates related to this goal by clicking on the "Past Updates" button in the selected goal        Plan:   Telephone follow up appointment with care management team member scheduled for:  09/20/19  Barb Merino, RN, BSN, CCM Care Management Coordinator [place] [facility]  Direct Phone: [phone]

## 2019-09-07 NOTE — Patient Instructions (Signed)
Visit Information  Goals Addressed      Patient Stated   .  "I am having more arthritis pain in my arms and legs" (pt-stated)   Not on track     Current Barriers:  . Poor adherence to following a HEP . Osteoarthritis  . Impaired Physical Mobility   Nurse Case Manager Clinical Goal(s):  [name] Kitchen Over the next 60 days, patient will verbalize understanding of plan for resumption of in home PT to evaluate and treat impaired physical mobility, poor gait, decreased ROM  Goal Met  New 08/31/19 Over the next 30 days, patient will start in home PT/OT through [facility] to improve her strength, balance, stamina and mobility.   CCM RN CM Interventions:  08/31/19 completed call with granddaughter Caryl Pina  . Evaluation of current treatment plan related to Osteoarthritis pain and Impaired Physical Mobility and patient's adherence to plan as established by provider . Determined granddaughter Caryl Pina has noticed increased edema to her grandmother's lower extremities, her weight has not changed, her grandmother is not as active as before resulting in increased pain and stiffness, she notified Dr. Baird Cancer . Determined Dr. Baird Cancer recommended a referral to [facility] and the patient is agreeable . Education provided to [employer] and the services they provide; including PT/OT services . Discussed granddaughter Caryl Pina will inform the lead nurse admitting her grandmother into this service of her concerns related to patient's decreased mobility and request for PT/OT . Discussed plans with patient for ongoing care management follow up and provided patient with direct contact information for care management team 09/06/19 call completed with granddaughter Caryl Pina  . Collaborated with PCP regarding referral for [facility], received notification this referral and pertinent clinical documentation has been sent to [facility]  . Determined Caryl Pina will contact [facility] if not contacted by their  intake department over the next few business days . Provided Caryl Pina with the contact information for [facility]  . Discussed plans with patient for ongoing care management follow up and provided patient with direct contact information for care management team  Patient Self Care Activities:  . Patient is currently UNABLE TO independently provide self-care or self administer medications  Please see past updates related to this goal by clicking on the "Past Updates" button in the selected goal        Other   .  "I think my grandmother has CHF"   On track     [place] (see longitudinal plan of care for additional care plan information  Current Barriers:  [name] Kitchen Knowledge Deficits related to disease process and Self Health management of CHF . Chronic Disease Management support and education needs related to DM, HTN, s/p fall, Medication Management, CHF  Nurse Case Manager Clinical Goal(s):  [name] Kitchen New 08/31/19 Over the next 90 days, patient and caregiver will work with the CCM team and PCP to improve Self Health management of CHF as evidence by patient will experience having no CHF exacerbations or hospitalizations   CCM RN CM Interventions:  08/31/19 completed call with granddaughter Caryl Pina  . Evaluation of current treatment plan related to CHF and patient's adherence to plan as established by provider . Determined granddaughter Caryl Pina has noticed increased edema to her grandmother's lower extremities, her weight has not changed, her grandmother is not as active as before, she notified Dr. Baird Cancer . Determined Dr. Baird Cancer recommended a referral to [facility] and the patient is agreeable . Education provided to [employer] regarding [employer] and  the services they provide; provided her with the contact number and discussed if a call from [place] has not been received by the end of the week, Caryl Pina will follow up . Advised patient, providing education and rationale, to weigh daily and  record, calling PCP office and or CCM team for weight gain of 3lbs overnight or 5 pounds in a week . Reiterated the importance of having patient follow a strict low Sodium diet to help avoid fluid retention and elevated BP . Determined patient's am CNA [facility]) is unavailable in the afternoon and more caregiver assistance is needed for the patient to help prepare dinner and get her settled for bedtime . Determined the family would like to privately pay for a CNA but would like to work with the embedded BSW to obtain local resources for which they can hire a safe reliable licensed CNA  . Collaborated with embedded BSW Daneen Schick via in basket message regarding the family's request for assistance with locating a licensed CNA . Discussed plans with patient for ongoing care management follow up and provided patient with direct contact information for care management team 09/06/19 call completed with granddaughter Caryl Pina  . Collaborated with PCP regarding referral for [facility], received notification this referral and pertinent clinical documentation has been sent to [facility]  . Determined Caryl Pina will contact [facility] if not contacted by their intake department over the next few business days . Provided Caryl Pina with the contact information for [facility]  . Discussed plans with patient for ongoing care management follow up and provided patient with direct contact information for care management team  Patient Self Care Activities:  Patient is currently UNABLE TO independently provide self-care or self administer medications  Please see past updates related to this goal by clicking on the "Past Updates" button in the selected goal          Patient verbalizes understanding of instructions provided today.   Telephone follow up appointment with care management team member scheduled for: 09/20/19  Barb Merino, RN, BSN, CCM Care Management Coordinator [place] [facility]  Direct Phone: [phone]

## 2019-09-08 DIAGNOSIS — M79605 Pain in left leg: Secondary | ICD-10-CM | POA: Diagnosis not present

## 2019-09-12 ENCOUNTER — Ambulatory Visit: Payer: Medicare PPO

## 2019-09-12 DIAGNOSIS — I509 Heart failure, unspecified: Secondary | ICD-10-CM

## 2019-09-12 DIAGNOSIS — I10 Essential (primary) hypertension: Secondary | ICD-10-CM

## 2019-09-12 DIAGNOSIS — N183 Chronic kidney disease, stage 3 unspecified: Secondary | ICD-10-CM

## 2019-09-12 DIAGNOSIS — E1122 Type 2 diabetes mellitus with diabetic chronic kidney disease: Secondary | ICD-10-CM

## 2019-09-12 NOTE — Patient Instructions (Signed)
Social Worker Visit Information  Goals we discussed today:  Goals Addressed            This Visit's Progress   . "We would like to hire an afternoon caregiver"       Grand-daughter stated:  CARE PLAN ENTRY (see longitudinal plan of care for additional care plan information)  Current Barriers:  . Limited access to caregiver . Inability to perform ADL's independently . Inability to perform IADL's independently . Chronic conditions including DM, HTN, and CHF which impact patient care needs  Social Work Clinical Goal(s):  [name] Kitchen Over the next 20 days, the patient and her family will work with SW to identify caregiver agencies to assist with patient care needs . Over the next 30 days the patient and her family will work with SW to identify patient place on [place] In home aide services wait list  CCM SW Interventions: Completed 09/12/19 with grand-daughter Morrie Sheldon . Inter-disciplinary care team collaboration (see longitudinal plan of care) . Collaboration with RN Care Manager who reports interest in hiring a caregiver to assist with patient care needs . Outbound call placed to [place] to assess for desired services o Morrie Sheldon reports she is interested in hiring a caregiver from 3-6pm to assist with afternoon care needs including feeding, toileting, dressing . Provided education surrounding opportunity to privately hire a caregiver through an agency and/or an independent caregiver . Discussed most hired caregivers require a 4 hour minimum to accept a patient . Provided Morrie Sheldon with a list of home care provider agencies that service [facility] . Performed chart review to note SW placed patient on wait list to receive in home aide services through [facility] on 06/09/18 o Advised Morrie Sheldon SW would outreach the in home aide supervisor to determine patient place on waiting list . Collaboration with Sharyon Cable, in home aide supervisor, to request follow up . Scheduled follow  up appointment over the next 3 weeks  Patient Self Care Activities:  . Calls pharmacy for medication refills . Calls provider office for new concerns or questions . Supportive family to assist with patient care needs  Initial goal documentation         Materials Provided: Yes: provided a list on home care agencies  Follow Up Plan: SW will follow up with patient by phone over the next 3 weeks.  Bevelyn Ngo, BSW, CDP Social Worker, Certified Dementia Practitioner [facility] Care Management [phone]

## 2019-09-12 NOTE — Chronic Care Management (AMB) (Signed)
Chronic Care Management    Social Work Follow Up Note  09/12/2019 Name: Helen Richardson MRN: [id number] DOB: 1933-09-12  Helen Richardson is a 86 y.o. year old female who is a primary care patient of Dorothyann Peng, MD. The CCM team was consulted for assistance with care coordination.   Review of patient status, including review of consultants reports, other relevant assessments, and collaboration with appropriate care team members and the patient's provider was performed as part of comprehensive patient evaluation and provision of chronic care management services.    SDOH (Social Determinants of Health) assessments performed: No    Outpatient Encounter Medications as of 09/12/2019  Medication Sig Note  . ADVOCATE LANCETS MISC  03/10/2013: Received from: External Pharmacy  . Alcohol Swabs (ALCOHOL PREP) 70 % PADS  03/10/2013: Received from: External Pharmacy  . aspirin 81 MG chewable tablet Chew 81 mg by mouth at bedtime.   . Calcium Carbonate-Vitamin D (CALTRATE 600+D PO) Take 1 tablet by mouth daily.   . carvedilol (COREG) 6.25 MG tablet TAKE 1 TABLET BY MOUTH TWICE DAILY   . COMFORT EZ PEN NEEDLES 32G X 4 MM MISC    . diclofenac sodium (VOLTAREN) 1 % GEL APPLY TO AFFECTED AREA 3 TIMES DAILY AS NEEDED (Patient taking differently: Apply 2 g topically 3 (three) times daily as needed (pain). )   . furosemide (LASIX) 40 MG tablet TAKE 1 TABLET(40 MG) BY MOUTH DAILY   . glucose blood test strip Test up to 4 times daily   . Lancet Devices (ADVOCATE LANCING DEVICE) MISC  03/10/2013: Received from: External Pharmacy  . polyethylene glycol powder (GLYCOLAX/MIRALAX) 17 GM/SCOOP powder polyethylene glycol 3350 17 gram/dose oral powder  MIX 1 CAPFUL IN DRINK AND TK PO 1 TO 3 TIMES DAILY PRF DAILY SOFT STOOLS   . potassium chloride SA (KLOR-CON) 20 MEQ tablet TAKE 1 TABLET BY MOUTH TWICE DAILY   . rosuvastatin (CRESTOR) 10 MG tablet Take one tablet by mouth Monday -Friday. Do not take on Saturday or sunday    . rosuvastatin (CRESTOR) 20 MG tablet TAKE 1 TABLET BY MOUTH EVERY DAY   . telmisartan (MICARDIS) 80 MG tablet TAKE 1 TABLET(80 MG) BY MOUTH DAILY   . TRADJENTA 5 MG TABS tablet TK 1 T PO QD   . TRESIBA FLEXTOUCH 200 UNIT/ML SOPN ADMINISTER 34 UNITS UNDER THE SKIN EVERY EVENING. MAX TITRATION- 80 UNITS   . UNABLE TO FIND lancets 30 gauge   . UNABLE TO FIND Comfort EZ Pen Needles 32 gauge x 5/32"    No facility-administered encounter medications on file as of 09/12/2019.     Goals Addressed            This Visit's Progress   . "We would like to hire an afternoon caregiver"       Grand-daughter stated:  CARE PLAN ENTRY (see longitudinal plan of care for additional care plan information)  Current Barriers:  . Limited access to caregiver . Inability to perform ADL's independently . Inability to perform IADL's independently . Chronic conditions including DM, HTN, and CHF which impact patient care needs  Social Work Clinical Goal(s):  [name] Kitchen Over the next 20 days, the patient and her family will work with SW to identify caregiver agencies to assist with patient care needs . Over the next 30 days the patient and her family will work with SW to identify patient place on [place] In home aide services wait list  CCM SW Interventions: Completed 09/12/19 with  grand-daughter Helen Richardson . Inter-disciplinary care team collaboration (see longitudinal plan of care) . Collaboration with RN Care Manager who reports interest in hiring a caregiver to assist with patient care needs . Outbound call placed to [place] to assess for desired services o Helen Richardson reports she is interested in hiring a caregiver from 3-6pm to assist with afternoon care needs including feeding, toileting, dressing . Provided education surrounding opportunity to privately hire a caregiver through an agency and/or an independent caregiver . Discussed most hired caregivers require a 4 hour minimum to accept a patient . Provided  Helen Richardson with a list of home care provider agencies that service [facility] . Performed chart review to note SW placed patient on wait list to receive in home aide services through [facility] on 06/09/18 o Advised Helen Richardson SW would outreach the in home aide supervisor to determine patient place on waiting list . Collaboration with Sharyon Cable, in home aide supervisor, to request follow up . Scheduled follow up appointment over the next 3 weeks  Patient Self Care Activities:  . Calls pharmacy for medication refills . Calls provider office for new concerns or questions . Supportive family to assist with patient care needs  Initial goal documentation         Follow Up Plan: SW will follow up with patient by phone over the next 3 weeks.  Bevelyn Ngo, BSW, CDP Social Worker, Certified Dementia Practitioner [facility] Care Management [phone]  Total time spent performing care coordination and/or care management activities with the patient by phone or face to face = 15 minutes.

## 2019-09-20 ENCOUNTER — Other Ambulatory Visit: Payer: Self-pay

## 2019-09-20 ENCOUNTER — Ambulatory Visit: Payer: Self-pay

## 2019-09-20 ENCOUNTER — Telehealth: Payer: Medicare PPO

## 2019-09-20 DIAGNOSIS — I10 Essential (primary) hypertension: Secondary | ICD-10-CM | POA: Diagnosis not present

## 2019-09-20 DIAGNOSIS — N183 Chronic kidney disease, stage 3 unspecified: Secondary | ICD-10-CM

## 2019-09-20 DIAGNOSIS — I129 Hypertensive chronic kidney disease with stage 1 through stage 4 chronic kidney disease, or unspecified chronic kidney disease: Secondary | ICD-10-CM

## 2019-09-20 DIAGNOSIS — Z794 Long term (current) use of insulin: Secondary | ICD-10-CM

## 2019-09-20 DIAGNOSIS — E1122 Type 2 diabetes mellitus with diabetic chronic kidney disease: Secondary | ICD-10-CM | POA: Diagnosis not present

## 2019-09-20 DIAGNOSIS — I509 Heart failure, unspecified: Secondary | ICD-10-CM

## 2019-09-21 NOTE — Chronic Care Management (AMB) (Signed)
  Chronic Care Management   Outreach Note  09/21/2019 Name: Helen Richardson MRN: [id number] DOB: 1933-08-03  Referred by: Dorothyann Peng, MD Reason for referral : Chronic Care Management (FU RN CM Call - [facility]/care plan )   An unsuccessful telephone outreach was attempted today. The patient was referred to the case management team for assistance with care management and care coordination.   Follow Up Plan: A HIPPA compliant phone message was left for the patient providing contact information and requesting a return call.  Telephone follow up appointment with care management team member scheduled for: 11/02/19  Delsa Sale, RN, BSN, CCM Care Management Coordinator [facility] Management/[facility]  Direct Phone: [phone]

## 2019-09-22 ENCOUNTER — Other Ambulatory Visit: Payer: Self-pay | Admitting: Internal Medicine

## 2019-09-27 ENCOUNTER — Other Ambulatory Visit: Payer: Self-pay

## 2019-09-27 ENCOUNTER — Ambulatory Visit: Payer: Medicare PPO

## 2019-09-27 ENCOUNTER — Telehealth: Payer: Self-pay | Admitting: *Deleted

## 2019-09-27 DIAGNOSIS — E1122 Type 2 diabetes mellitus with diabetic chronic kidney disease: Secondary | ICD-10-CM

## 2019-09-27 DIAGNOSIS — I10 Essential (primary) hypertension: Secondary | ICD-10-CM

## 2019-09-27 DIAGNOSIS — Z794 Long term (current) use of insulin: Secondary | ICD-10-CM

## 2019-09-27 NOTE — Patient Instructions (Signed)
Social Worker Visit Information  Goals we discussed today:  Goals Addressed            This Visit's Progress   . "We would like to hire an afternoon caregiver"   Not on track    Grand-daughter stated:  CARE PLAN ENTRY (see longitudinal plan of care for additional care plan information)  Current Barriers:  . Limited access to caregiver . Inability to perform ADL's independently . Inability to perform IADL's independently . Chronic conditions including DM, HTN, and CHF which impact patient care needs  Social Work Clinical Goal(s):  [name] Kitchen Over the next 20 days, the patient and her family will work with SW to identify caregiver agencies to assist with patient care needs . Over the next 30 days the patient and her family will work with SW to identify patient place on [place] In home aide services wait list Completed  CCM SW Interventions: Completed 09/27/19 with grand-daughter Morrie Sheldon . Successful outbound call placed to [facility] to assess progression of patient goal . Confirmed receipt of mailed resource identifying in home aide agencies to provide private duty aide assistance o Discussed Morrie Sheldon has had difficulty locating a caregiver to fulfill needed hours of assistance o Morrie Sheldon reports plans to begin a two month program next month in [place] prior to beginning a new job which will only leave her aunt to assist with care needs when the morning aide is not in the home . Truddie Crumble, SW was in contact with [facility] in home aide supervisor, Marye Round who reported the patient was approved for caregiver hours in March of 2021 but has yet to receive assistance due to staffing challenges o Discussed difficulty locating caregivers due to staffing challenges from the COVID pandemic . Collaboration with Marye Round to request an update from patients assigned social worker regarding in home aide assignment . Scheduled follow up over the next two weeks  Completed  09/12/19 with grand-daughter Morrie Sheldon . Inter-disciplinary care team collaboration (see longitudinal plan of care) . Collaboration with RN Care Manager who reports interest in hiring a caregiver to assist with patient care needs . Outbound call placed to [facility] to assess for desired services o Morrie Sheldon reports she is interested in hiring a caregiver from 3-6pm to assist with afternoon care needs including feeding, toileting, dressing . Provided education surrounding opportunity to privately hire a caregiver through an agency and/or an independent caregiver . Discussed most hired caregivers require a 4 hour minimum to accept a patient . Provided Morrie Sheldon with a list of home care provider agencies that service [facility] . Performed chart review to note SW placed patient on wait list to receive in home aide services through [facility] on 06/09/18 o Advised Morrie Sheldon SW would outreach the in home aide supervisor to determine patient place on waiting list . Collaboration with Sharyon Cable, in home aide supervisor, to request follow up . Scheduled follow up appointment over the next 3 weeks  Patient Self Care Activities:  . Calls pharmacy for medication refills . Calls provider office for new concerns or questions . Supportive family to assist with patient care needs  Please see past updates related to this goal by clicking on the "Past Updates" button in the selected goal          Materials Provided: Verbal education about in home aide program provided by phone  Follow Up Plan: SW will follow up with patient by phone over the next two weeks.  Daneen Schick, BSW, CDP Social Worker, Certified Dementia Practitioner [place] [facility] [place] Management [phone]

## 2019-09-27 NOTE — Chronic Care Management (AMB) (Signed)
  Chronic Care Management   Note  09/27/2019 Name: Helen Richardson MRN: [id number] DOB: 1933-08-05  Helen Richardson is a 86 y.o. year old female who is a primary care patient of Dorothyann Peng, MD and is actively engaged with the care management team. I reached out to Helen Richardson by phone today to assist with scheduling an initial visit with the Pharmacist.  Follow up plan: Telephone appointment with care management team member scheduled for: 11/08/2019  [facility] Guide, Embedded Care Coordination [facility]  [place], [place] [address] Direct Dial: [phone] [email or web address] Website: [email or web address]

## 2019-09-27 NOTE — Chronic Care Management (AMB) (Signed)
Chronic Care Management    Social Work Follow Up Note  09/27/2019 Name: Helen Richardson MRN: [id number] DOB: 15-Aug-1933  Helen Richardson is a 86 y.o. year old female who is a primary care patient of Dorothyann Peng, MD. The CCM team was consulted for assistance with care coordination.   Review of patient status, including review of consultants reports, other relevant assessments, and collaboration with appropriate care team members and the patient's provider was performed as part of comprehensive patient evaluation and provision of chronic care management services.    SW placed a successful outbound call to the patients grand-daughter/caregiver, Caitlynn Ju to review progression of patient SW goal. During today's call, Morrie Sheldon reports concern surrounding medication management and feels home delivered medications and possibly pre-packaged medications may benefit the patient. SW collaboration with co-worker Burna Sis, to request assistance with scheduling an appointment with Beryle Flock, PharmD.  SDOH (Social Determinants of Health) assessments performed: No    Outpatient Encounter Medications as of 09/27/2019  Medication Sig Note  . Alcohol Swabs (ALCOHOL PREP) 70 % PADS  03/10/2013: Received from: External Pharmacy  . aspirin 81 MG chewable tablet Chew 81 mg by mouth at bedtime.   . Calcium Carbonate-Vitamin D (CALTRATE 600+D PO) Take 1 tablet by mouth daily.   . carvedilol (COREG) 6.25 MG tablet TAKE 1 TABLET BY MOUTH TWICE DAILY   . diclofenac sodium (VOLTAREN) 1 % GEL APPLY TO AFFECTED AREA 3 TIMES DAILY AS NEEDED (Patient taking differently: Apply 2 g topically 3 (three) times daily as needed (pain). )   . furosemide (LASIX) 40 MG tablet TAKE 1 TABLET(40 MG) BY MOUTH DAILY   . glucose blood test strip Test up to 4 times daily   . polyethylene glycol powder (GLYCOLAX/MIRALAX) 17 GM/SCOOP powder polyethylene glycol 3350 17 gram/dose oral powder  MIX 1 CAPFUL IN DRINK AND TK PO 1 TO 3  TIMES DAILY PRF DAILY SOFT STOOLS   . potassium chloride SA (KLOR-CON) 20 MEQ tablet TAKE 1 TABLET BY MOUTH TWICE DAILY   . rosuvastatin (CRESTOR) 10 MG tablet TAKE 1 TABLET BY MOUTH MONDAY-FRIDAY. DO NOT TAKE ON SATURDAY OR SUNDAY   . telmisartan (MICARDIS) 80 MG tablet TAKE 1 TABLET(80 MG) BY MOUTH DAILY   . TRADJENTA 5 MG TABS tablet TK 1 T PO QD   . TRESIBA FLEXTOUCH 200 UNIT/ML SOPN ADMINISTER 34 UNITS UNDER THE SKIN EVERY EVENING. MAX TITRATION- 80 UNITS   . UNABLE TO FIND lancets 30 gauge   . UNABLE TO FIND Comfort EZ Pen Needles 32 gauge x 5/32"    No facility-administered encounter medications on file as of 09/27/2019.     Goals Addressed            This Visit's Progress   . "We would like to hire an afternoon caregiver"   Not on track    Grand-daughter stated:  CARE PLAN ENTRY (see longitudinal plan of care for additional care plan information)  Current Barriers:  . Limited access to caregiver . Inability to perform ADL's independently . Inability to perform IADL's independently . Chronic conditions including DM, HTN, and CHF which impact patient care needs  Social Work Clinical Goal(s):  [name] Kitchen Over the next 20 days, the patient and her family will work with SW to identify caregiver agencies to assist with patient care needs . Over the next 30 days the patient and her family will work with SW to identify patient place on [place] In home aide services wait list  Completed  CCM SW Interventions: Completed 09/27/19 with grand-daughter Morrie Sheldon . Successful outbound call placed to [facility] to assess progression of patient goal . Confirmed receipt of mailed resource identifying in home aide agencies to provide private duty aide assistance o Discussed Morrie Sheldon has had difficulty locating a caregiver to fulfill needed hours of assistance o Morrie Sheldon reports plans to begin a two month program next month in [place] prior to beginning a new job which will only leave her aunt to  assist with care needs when the morning aide is not in the home . Truddie Crumble, SW was in contact with [facility] in home aide supervisor, Marye Round who reported the patient was approved for caregiver hours in March of 2021 but has yet to receive assistance due to staffing challenges o Discussed difficulty locating caregivers due to staffing challenges from the COVID pandemic . Collaboration with Marye Round to request an update from patients assigned social worker regarding in home aide assignment . Scheduled follow up over the next two weeks  Completed 09/12/19 with grand-daughter Morrie Sheldon . Inter-disciplinary care team collaboration (see longitudinal plan of care) . Collaboration with RN Care Manager who reports interest in hiring a caregiver to assist with patient care needs . Outbound call placed to [place] to assess for desired services o Morrie Sheldon reports she is interested in hiring a caregiver from 3-6pm to assist with afternoon care needs including feeding, toileting, dressing . Provided education surrounding opportunity to privately hire a caregiver through an agency and/or an independent caregiver . Discussed most hired caregivers require a 4 hour minimum to accept a patient . Provided Morrie Sheldon with a list of home care provider agencies that service [facility] . Performed chart review to note SW placed patient on wait list to receive in home aide services through [facility] on 06/09/18 o Advised Morrie Sheldon SW would outreach the in home aide supervisor to determine patient place on waiting list . Collaboration with Sharyon Cable, in home aide supervisor, to request follow up . Scheduled follow up appointment over the next 3 weeks  Patient Self Care Activities:  . Calls pharmacy for medication refills . Calls provider office for new concerns or questions . Supportive family to assist with patient care needs  Please see past updates related to this goal by  clicking on the "Past Updates" button in the selected goal          Follow Up Plan: SW will follow up with patient by phone over the next two weeks.   Bevelyn Ngo, BSW, CDP Social Worker, Certified Dementia Practitioner [facility] Care Management [phone]  Total time spent performing care coordination and/or care management activities with the patient by phone or face to face = 15 minutes.

## 2019-09-28 ENCOUNTER — Telehealth: Payer: Self-pay | Admitting: *Deleted

## 2019-09-28 NOTE — Chronic Care Management (AMB) (Signed)
  Chronic Care Management   Note  09/28/2019 Name: ELEXIS POLLAK MRN: [id number] DOB: 1933-08-24  [name] is a 86 y.o. year old female who is a primary care patient of Dorothyann Peng, MD and is actively engaged with the care management team. I reached out to [name] by phone today to assist with scheduling a follow up visit with the Pharmacist  Follow up plan: Telephone appointment with care management team member scheduled for:10/05/2019  [facility] Guide, Embedded Care Coordination [facility]  [place], [place] [address] Direct Dial: [phone] [email or web address] Website: [email or web address]

## 2019-10-03 ENCOUNTER — Telehealth: Payer: Medicare PPO

## 2019-10-03 ENCOUNTER — Other Ambulatory Visit: Payer: Self-pay | Admitting: Internal Medicine

## 2019-10-05 ENCOUNTER — Ambulatory Visit (INDEPENDENT_AMBULATORY_CARE_PROVIDER_SITE_OTHER): Payer: Medicare PPO

## 2019-10-05 ENCOUNTER — Other Ambulatory Visit: Payer: Self-pay

## 2019-10-05 DIAGNOSIS — I509 Heart failure, unspecified: Secondary | ICD-10-CM | POA: Diagnosis not present

## 2019-10-05 DIAGNOSIS — Z794 Long term (current) use of insulin: Secondary | ICD-10-CM | POA: Diagnosis not present

## 2019-10-05 DIAGNOSIS — E1122 Type 2 diabetes mellitus with diabetic chronic kidney disease: Secondary | ICD-10-CM | POA: Diagnosis not present

## 2019-10-05 DIAGNOSIS — N183 Chronic kidney disease, stage 3 unspecified: Secondary | ICD-10-CM

## 2019-10-05 DIAGNOSIS — I10 Essential (primary) hypertension: Secondary | ICD-10-CM | POA: Diagnosis not present

## 2019-10-05 NOTE — Chronic Care Management (AMB) (Signed)
   Chronic Care Management Pharmacy  Name: Helen Richardson  MRN: [id number] DOB: 1933/04/29  Chief Complaint/ HPI  Initial outreach to patient's granddaughter, Helen Richardson, regarding interest in mail order and possibly pill packaging. Referred by embedded SW, Helen Richardson. Discussed that [employer] does not offer pill packaging via mail order. Advised pt that [facility] can package medications for patient and deliver directly to patient.   11/01/19 Telephone call: Spoke with Helen Richardson to verify patient wants OTC medications in packaging and determine what time each medication is taken at. Helen Richardson would like [facility] to be contacted for monthly dispensing calls if possible. Provided with contact information for Elida, home health nurse. Contacted [name] who provided timing of administration for all medications. Verified Helen Richardson will be contact for monthly dispensing calls from [facility]. Per [facility], patient is not currently out of any medications (she just filled up pill packages for the week). Helen Richardson will call tomorrow to provide next needed fill dates. Helen Richardson also questioned if patient was supposed to stay on potassium once daily or increase back to twice daily. Sent in basket message to PCP to see when patient will need an office visit and lab work next.   PCP : Helen Peng, MD  Verbal consent obtained for [facility] enhanced pharmacy services (medication synchronization, adherence packaging, delivery coordination). A medication sync plan was created to allow patient to get all medications delivered once every 30 to 90 days per patient preference. Patient understands they have freedom to choose pharmacy and clinical pharmacist will coordinate care between all prescribers and [facility].  SDOH Interventions     Most Recent Value  SDOH Interventions  Financial Strain Interventions Intervention Not Indicated     Goals Addressed            This Visit's Progress   . Medication Management         CARE PLAN ENTRY (see longitudinal plan of care for additional care plan information)  Current Barriers:  . Complex patient with multiple comorbidities including hypertension, diabetes, hyperlipidemia . Self-manages medications by using a pill box prepared by home health nurse  Pharmacist Clinical Goal(s):  [name] Kitchen Over the next 90 days, patient will work with PharmD and provider towards optimized medication management  Interventions: . Discussed benefits of adherence packaging, medications synchronization, and delivery available with [facility]  . Comprehensive medication review performed; medication list updated in electronic medical record . Inter-disciplinary care team collaboration (see longitudinal plan of care) . Reviewed timing of administration of all medications with nurse, Helen Richardson . Determined patient's family would like over the counter medications included in adherence packaging  Patient Self Care Activities:  . Patient will take medications as prescribed daily . Patient will focus on improved adherence by using adherence packaging . Utilize [facility]  Initial goal documentation       Follow up: 1 month phone visit  Beryle Flock, PharmD Clinical Pharmacist [facility] [phone]

## 2019-10-10 ENCOUNTER — Ambulatory Visit: Payer: Medicare PPO

## 2019-10-10 DIAGNOSIS — Z794 Long term (current) use of insulin: Secondary | ICD-10-CM

## 2019-10-10 DIAGNOSIS — N183 Chronic kidney disease, stage 3 unspecified: Secondary | ICD-10-CM

## 2019-10-10 DIAGNOSIS — I10 Essential (primary) hypertension: Secondary | ICD-10-CM

## 2019-10-10 DIAGNOSIS — E1122 Type 2 diabetes mellitus with diabetic chronic kidney disease: Secondary | ICD-10-CM | POA: Diagnosis not present

## 2019-10-10 DIAGNOSIS — I509 Heart failure, unspecified: Secondary | ICD-10-CM

## 2019-10-10 NOTE — Chronic Care Management (AMB) (Signed)
Chronic Care Management    Social Work Follow Up Note  10/10/2019 Name: Helen Richardson MRN: [id number] DOB: 01-30-1934  Helen Richardson is a 86 y.o. year old female who is a primary care patient of Dorothyann Peng, MD. The CCM team was consulted for assistance with care coordination.   Review of patient status, including review of consultants reports, other relevant assessments, and collaboration with appropriate care team members and the patient's provider was performed as part of comprehensive patient evaluation and provision of chronic care management services.    SDOH (Social Determinants of Health) assessments performed: No    Outpatient Encounter Medications as of 10/10/2019  Medication Sig Note  . Alcohol Swabs (ALCOHOL PREP) 70 % PADS  03/10/2013: Received from: External Pharmacy  . aspirin 81 MG chewable tablet Chew 81 mg by mouth at bedtime.   . Calcium Carbonate-Vitamin D (CALTRATE 600+D PO) Take 1 tablet by mouth daily.   . carvedilol (COREG) 6.25 MG tablet TAKE 1 TABLET BY MOUTH TWICE DAILY   . diclofenac sodium (VOLTAREN) 1 % GEL APPLY TO AFFECTED AREA 3 TIMES DAILY AS NEEDED (Patient taking differently: Apply 2 g topically 3 (three) times daily as needed (pain). )   . furosemide (LASIX) 40 MG tablet TAKE 1 TABLET(40 MG) BY MOUTH DAILY   . glucose blood test strip Test up to 4 times daily   . polyethylene glycol powder (GLYCOLAX/MIRALAX) 17 GM/SCOOP powder polyethylene glycol 3350 17 gram/dose oral powder  MIX 1 CAPFUL IN DRINK AND TK PO 1 TO 3 TIMES DAILY PRF DAILY SOFT STOOLS   . potassium chloride SA (KLOR-CON) 20 MEQ tablet TAKE 1 TABLET BY MOUTH TWICE DAILY   . rosuvastatin (CRESTOR) 10 MG tablet TAKE 1 TABLET BY MOUTH MONDAY-FRIDAY. DO NOT TAKE ON SATURDAY OR SUNDAY   . telmisartan (MICARDIS) 80 MG tablet TAKE 1 TABLET(80 MG) BY MOUTH DAILY   . TRADJENTA 5 MG TABS tablet TK 1 T PO QD   . TRESIBA FLEXTOUCH 200 UNIT/ML SOPN ADMINISTER 34 UNITS UNDER THE SKIN EVERY EVENING. MAX  TITRATION- 80 UNITS   . UNABLE TO FIND lancets 30 gauge   . UNABLE TO FIND Comfort EZ Pen Needles 32 gauge x 5/32"    No facility-administered encounter medications on file as of 10/10/2019.     Goals Addressed            This Visit's Progress   . COMPLETED: "We would like to hire an afternoon caregiver"       Grand-daughter stated:  CARE PLAN ENTRY (see longitudinal plan of care for additional care plan information)  Current Barriers:  . Limited access to caregiver . Inability to perform ADL's independently . Inability to perform IADL's independently . Chronic conditions including DM, HTN, and CHF which impact patient care needs  Social Work Clinical Goal(s):  [name] Kitchen Over the next 20 days, the patient and her family will work with SW to identify caregiver agencies to assist with patient care needs . Over the next 30 days the patient and her family will work with SW to identify patient place on [place] In home aide services wait list Completed  CCM SW Interventions: Completed 10/10/19 with grand-daughter Helen Richardson . Collaboration with Marye Round from [facility] in home aide program regarding patient status: "There are no CNA available to assist the client.  We have checked with all of our other contracted agencies and no one has an available aide.  The current agency she is assigned to  receive care are looking for a CNA to hire.  We can switch agencies if an agency has an aide for her." . Successful outbound call placed to the patients grand-daughter and caregiver Helen Richardson . Discussed above response from Mrs. Durrett and barriers to receiving in home care . Reviewed opportunity to enroll patient in [facility] to receive care assistance including a caregiver in the home to assist with ADL's as well as option to attend adult day program o Determined the family is not interested in this program due to requirement to switch primary care provider . Determined Helen Richardson feels  the patient is generally okay in the home at this time o The patient has a morning caregiver to assist with morning care and medication administration o The patient would benefit from an afternoon caregiver but the family is prepared to continue providing this assistance to the patient . Goal closed  Completed 09/27/19 with grand-daughter Helen Richardson . Successful outbound call placed to [facility] to assess progression of patient goal . Confirmed receipt of mailed resource identifying in home aide agencies to provide private duty aide assistance o Discussed Helen Richardson has had difficulty locating a caregiver to fulfill needed hours of assistance o Helen Richardson reports plans to begin a two month program next month in [place] prior to beginning a new job which will only leave her aunt to assist with care needs when the morning aide is not in the home . Truddie Crumble, SW was in contact with [facility] in home aide supervisor, Marye Round who reported the patient was approved for caregiver hours in March of 2021 but has yet to receive assistance due to staffing challenges o Discussed difficulty locating caregivers due to staffing challenges from the COVID pandemic . Collaboration with Marye Round to request an update from patients assigned social worker regarding in home aide assignment . Scheduled follow up over the next two weeks  Completed 09/12/19 with grand-daughter Helen Richardson . Inter-disciplinary care team collaboration (see longitudinal plan of care) . Collaboration with RN Care Manager who reports interest in hiring a caregiver to assist with patient care needs . Outbound call placed to [place] to assess for desired services o Helen Richardson reports she is interested in hiring a caregiver from 3-6pm to assist with afternoon care needs including feeding, toileting, dressing . Provided education surrounding opportunity to privately hire a caregiver through an agency and/or an independent  caregiver . Discussed most hired caregivers require a 4 hour minimum to accept a patient . Provided Helen Richardson with a list of home care provider agencies that service [facility] . Performed chart review to note SW placed patient on wait list to receive in home aide services through [facility] on 06/09/18 o Advised Helen Richardson SW would outreach the in home aide supervisor to determine patient place on waiting list . Collaboration with Sharyon Cable, in home aide supervisor, to request follow up . Scheduled follow up appointment over the next 3 weeks  Patient Self Care Activities:  . Calls pharmacy for medication refills . Calls provider office for new concerns or questions . Supportive family to assist with patient care needs  Please see past updates related to this goal by clicking on the "Past Updates" button in the selected goal          Follow Up Plan: No SW follow up planned at this time. The patient will remain active with RN Care Manager.   Bevelyn Ngo, BSW, CDP Social Worker, Web designer /  [facility] Care Management [phone]  Total time spent performing care coordination and/or care management activities with the patient by phone or face to face = 16 minutes.

## 2019-10-10 NOTE — Patient Instructions (Signed)
Social Worker Visit Information  Goals we discussed today:  Goals Addressed            This Visit's Progress   . COMPLETED: "We would like to hire an afternoon caregiver"       Grand-daughter stated:  CARE PLAN ENTRY (see longitudinal plan of care for additional care plan information)  Current Barriers:  . Limited access to caregiver . Inability to perform ADL's independently . Inability to perform IADL's independently . Chronic conditions including DM, HTN, and CHF which impact patient care needs  Social Work Clinical Goal(s):  Helen Kitchen Over the next 20 days, the patient and her family will work with SW to identify caregiver agencies to assist with patient care needs . Over the next 30 days the patient and her family will work with SW to identify patient place on [place] In home aide services wait list Completed  CCM SW Interventions: Completed 10/10/19 with grand-daughter Helen Richardson . Collaboration with Helen Richardson from [facility] in home aide program regarding patient status: "There are no CNA available to assist the client.  We have checked with all of our other contracted agencies and no one has an available aide.  The current agency she is assigned to receive care are looking for a CNA to hire.  We can switch agencies if an agency has an aide for her." . Successful outbound call placed to the patients grand-daughter and caregiver Helen Richardson . Discussed above response from Mrs. Richardson and barriers to receiving in home care . Reviewed opportunity to enroll patient in [facility] to receive care assistance including a caregiver in the home to assist with ADL's as well as option to attend adult day program o Determined the family is not interested in this program due to requirement to switch primary care provider . Determined Helen Richardson feels the patient is generally okay in the home at this time o The patient has a morning caregiver to assist with morning care and medication  administration o The patient would benefit from an afternoon caregiver but the family is prepared to continue providing this assistance to the patient . Goal closed  Completed 09/27/19 with grand-daughter Helen Richardson . Successful outbound call placed to [facility] to assess progression of patient goal . Confirmed receipt of mailed resource identifying in home aide agencies to provide private duty aide assistance o Discussed Helen Richardson has had difficulty locating a caregiver to fulfill needed hours of assistance o Helen Richardson reports plans to begin a two month program next month in [place] prior to beginning a new job which will only leave her aunt to assist with care needs when the morning aide is not in the home . Helen Richardson, SW was in contact with [facility] in home aide supervisor, Helen Richardson who reported the patient was approved for caregiver hours in March of 2021 but has yet to receive assistance due to staffing challenges o Discussed difficulty locating caregivers due to staffing challenges from the COVID pandemic . Collaboration with Helen Richardson to request an update from patients assigned social worker regarding in home aide assignment . Scheduled follow up over the next two weeks  Completed 09/12/19 with grand-daughter Helen Richardson . Inter-disciplinary care team collaboration (see longitudinal plan of care) . Collaboration with RN Care Manager who reports interest in hiring a caregiver to assist with patient care needs . Outbound call placed to [place] to assess for desired services o Helen Richardson reports she is interested in hiring a caregiver from 3-6pm to  assist with afternoon care needs including feeding, toileting, dressing . Provided education surrounding opportunity to privately hire a caregiver through an agency and/or an independent caregiver . Discussed most hired caregivers require a 4 hour minimum to accept a patient . Provided Helen Richardson with a list of home care provider agencies that  service [facility] . Performed chart review to note SW placed patient on wait list to receive in home aide services through [facility] on 06/09/18 o Advised Helen Richardson SW would outreach the in home aide supervisor to determine patient place on waiting list . Collaboration with Helen Richardson, in home aide supervisor, to request follow up . Scheduled follow up appointment over the next 3 weeks  Patient Self Care Activities:  . Calls pharmacy for medication refills . Calls provider office for new concerns or questions . Supportive family to assist with patient care needs  Please see past updates related to this goal by clicking on the "Past Updates" button in the selected goal          Follow Up Plan: No SW follow up planned at this time. The patient will remain active with RN Care Manager.   Helen Richardson, BSW, CDP Social Worker, Certified Dementia Practitioner [facility] Care Management [phone]

## 2019-10-11 ENCOUNTER — Telehealth: Payer: Self-pay

## 2019-10-11 ENCOUNTER — Ambulatory Visit: Payer: Medicare PPO | Admitting: Podiatry

## 2019-10-11 NOTE — Chronic Care Management (AMB) (Signed)
Chronic Care Management Pharmacy Assistant   Name: Helen Richardson  MRN: [id number] DOB: 1933/06/13  Reason for Encounter: Medication Review/ Pharmacy Transfer  PCP : Dorothyann Peng, MD  Allergies:   Allergies  Allergen Reactions  . Tradjenta [Linagliptin] Swelling    Medications: Outpatient Encounter Medications as of 10/11/2019  Medication Sig Note  . Alcohol Swabs (ALCOHOL PREP) 70 % PADS  03/10/2013: Received from: External Pharmacy  . aspirin 81 MG chewable tablet Chew 81 mg by mouth at bedtime.   . Calcium Carbonate-Vitamin D (CALTRATE 600+D PO) Take 1 tablet by mouth daily.   . carvedilol (COREG) 6.25 MG tablet TAKE 1 TABLET BY MOUTH TWICE DAILY   . diclofenac sodium (VOLTAREN) 1 % GEL APPLY TO AFFECTED AREA 3 TIMES DAILY AS NEEDED (Patient taking differently: Apply 2 g topically 3 (three) times daily as needed (pain). )   . furosemide (LASIX) 40 MG tablet TAKE 1 TABLET(40 MG) BY MOUTH DAILY   . glucose blood test strip Test up to 4 times daily   . polyethylene glycol powder (GLYCOLAX/MIRALAX) 17 GM/SCOOP powder polyethylene glycol 3350 17 gram/dose oral powder  MIX 1 CAPFUL IN DRINK AND TK PO 1 TO 3 TIMES DAILY PRF DAILY SOFT STOOLS   . potassium chloride SA (KLOR-CON) 20 MEQ tablet TAKE 1 TABLET BY MOUTH TWICE DAILY   . rosuvastatin (CRESTOR) 10 MG tablet TAKE 1 TABLET BY MOUTH MONDAY-FRIDAY. DO NOT TAKE ON SATURDAY OR SUNDAY   . telmisartan (MICARDIS) 80 MG tablet TAKE 1 TABLET(80 MG) BY MOUTH DAILY   . TRADJENTA 5 MG TABS tablet TK 1 T PO QD   . TRESIBA FLEXTOUCH 200 UNIT/ML SOPN ADMINISTER 34 UNITS UNDER THE SKIN EVERY EVENING. MAX TITRATION- 80 UNITS   . UNABLE TO FIND lancets 30 gauge   . UNABLE TO FIND Comfort EZ Pen Needles 32 gauge x 5/32"    No facility-administered encounter medications on file as of 10/11/2019.    Current Diagnosis: Patient Active Problem List   Diagnosis Date Noted  . Controlled type 2 diabetes mellitus with stable proliferative  retinopathy of both eyes, with long-term current use of insulin (HCC) 07/04/2019  . Intermediate stage nonexudative age-related macular degeneration of both eyes 07/04/2019  . Posterior vitreous detachment of right eye 07/04/2019  . Degenerative retinal drusen of both eyes 07/04/2019  . Chorioretinal scar 07/04/2019  . Pincer nail deformity 04/12/2019  . AKI (acute kidney injury) (HCC) 06/11/2018  . Sepsis due to gram-negative UTI (HCC) 06/11/2018  . Acute lower UTI (urinary tract infection) 06/10/2018  . HTN (hypertension) 06/10/2018  . Joint swelling 06/10/2018  . Hyperkalemia 06/10/2018  . Hyponatremia 06/10/2018  . Normocytic anemia 06/10/2018  . Type 2 diabetes mellitus with stage 3 chronic kidney disease, with long-term current use of insulin (HCC) 03/08/2018  . 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 Pure hypercholesterolemia 03/08/2018  . Class 3 severe obesity due to excess calories with serious comorbidity and body mass index (BMI) of 40.0 to 44.9 in adult [facility]) 03/08/2018     Follow-Up:  Coordination of Enhanced Pharmacy Services-New Onboarding patient to [facility] from [facility].Called home number to check on Potassium medication to make sure will not run out prior to anticipated delivery of 11/04/2019, HM # invalid, call emergency contact Helen Richardson-Granddaughter, no ans and vm full. Will retry.  10/14/2019- Left Message for granddaughter to return call, checking on the status of medication- Potassium to make sure she is not out prior to [facility] delivery.  10/14/2019 Granddaughter Helen Richardson called back she does not think patient is out of any medications, she will check with the nurse that comes in with patient also check with her aunt who checks on patient. Helen Richardson will call me back if there are any medications patient will run out prior to delivery.  Beryle Flock, CPP notified.   11/01/19- Called Granddaughter Helen Richardson again to follow up on any  medication needs, left message of delivery due to come to patient on 11/04/2019 and to call back with any needs prior to delivery.   [name], CMA Clinical Pharmacist Assistant [phone]

## 2019-10-18 ENCOUNTER — Ambulatory Visit: Payer: Self-pay | Admitting: Nurse Practitioner

## 2019-10-19 ENCOUNTER — Other Ambulatory Visit: Payer: Self-pay

## 2019-10-19 MED ORDER — POTASSIUM CHLORIDE CRYS ER 20 MEQ PO TBCR: 20.0000 meq | EXTENDED_RELEASE_TABLET | Freq: Once | ORAL | 2 refills | Status: DC

## 2019-10-19 NOTE — Telephone Encounter (Signed)
I returned a call to the patient and notified her that yes her potassium was for 1 time per day and that the pharmacy must have sent a refill request for the 2 times per day.  The 2 times per day was d/c'd with the pharmacy.

## 2019-10-25 DIAGNOSIS — E1165 Type 2 diabetes mellitus with hyperglycemia: Secondary | ICD-10-CM | POA: Diagnosis not present

## 2019-11-01 NOTE — Patient Instructions (Addendum)
Visit Information  Goals Addressed            This Visit's Progress   . Medication Management        CARE PLAN ENTRY (see longitudinal plan of care for additional care plan information)  Current Barriers:  . Complex patient with multiple comorbidities including hypertension, diabetes, hyperlipidemia . Self-manages medications by using a pill box prepared by home health nurse  Pharmacist Clinical Goal(s):  [name] Kitchen Over the next 90 days, patient will work with PharmD and provider towards optimized medication management  Interventions: . Discussed benefits of adherence packaging, medications synchronization, and delivery available with [facility]  . Comprehensive medication review performed; medication list updated in electronic medical record . Inter-disciplinary care team collaboration (see longitudinal plan of care) . Reviewed timing of administration of all medications with nurse, [name] . Determined patient's family would like over the counter medications included in adherence packaging  Patient Self Care Activities:  . Patient will take medications as prescribed daily . Patient will focus on improved adherence by using adherence packaging . Utilize [facility]  Initial goal documentation        Helen Richardson was given information about Chronic Care Management services today including:  1. CCM service includes personalized support from designated clinical staff supervised by her physician, including individualized plan of care and coordination with other care providers 2. 24/7 contact phone numbers for assistance for urgent and routine care needs. 3. Standard insurance, coinsurance, copays and deductibles apply for chronic care management only during months in which we provide at least 20 minutes of these services. Most insurances cover these services at 100%, however patients may be responsible for any copay, coinsurance and/or deductible if applicable. This service may  help you avoid the need for more expensive face-to-face services. 4. Only one practitioner may furnish and bill the service in a calendar month. 5. The patient may stop CCM services at any time (effective at the end of the month) by phone call to the office staff.  Patient agreed to services and verbal consent obtained.   The patient verbalized understanding of instructions provided today and agreed to receive a mailed copy of patient instruction and/or educational materials. Telephone follow up appointment with pharmacy team member scheduled for: 11/08/19 @ 9:00 AM  Beryle Flock, PharmD Clinical Pharmacist [facility] [phone]   Diabetes Mellitus and Nutrition, Adult When you have diabetes (diabetes mellitus), it is very important to have healthy eating habits because your blood sugar (glucose) levels are greatly affected by what you eat and drink. Eating healthy foods in the appropriate amounts, at about the same times every day, can help you:  Control your blood glucose.  Lower your risk of heart disease.  Improve your blood pressure.  Reach or maintain a healthy weight. Every person with diabetes is different, and each person has different needs for a meal plan. Your health care provider may recommend that you work with a diet and nutrition specialist (dietitian) to make a meal plan that is best for you. Your meal plan may vary depending on factors such as:  The calories you need.  The medicines you take.  Your weight.  Your blood glucose, blood pressure, and cholesterol levels.  Your activity level.  Other health conditions you have, such as heart or kidney disease. How do carbohydrates affect me? Carbohydrates, also called carbs, affect your blood glucose level more than any other type of food. Eating carbs naturally raises the amount of glucose  in your blood. Carb counting is a method for keeping track of how many carbs you eat. Counting  carbs is important to keep your blood glucose at a healthy level, especially if you use insulin or take certain oral diabetes medicines. It is important to know how many carbs you can safely have in each meal. This is different for every person. Your dietitian can help you calculate how many carbs you should have at each meal and for each snack. Foods that contain carbs include:  Bread, cereal, rice, pasta, and crackers.  Potatoes and corn.  Peas, beans, and lentils.  Milk and yogurt.  Fruit and juice.  Desserts, such as cakes, cookies, ice cream, and candy. How does alcohol affect me? Alcohol can cause a sudden decrease in blood glucose (hypoglycemia), especially if you use insulin or take certain oral diabetes medicines. Hypoglycemia can be a life-threatening condition. Symptoms of hypoglycemia (sleepiness, dizziness, and confusion) are similar to symptoms of having too much alcohol. If your health care provider says that alcohol is safe for you, follow these guidelines:  Limit alcohol intake to no more than 1 drink per day for nonpregnant women and 2 drinks per day for men. One drink equals 12 oz of beer, 5 oz of wine, or 1 oz of hard liquor.  Do not drink on an empty stomach.  Keep yourself hydrated with water, diet soda, or unsweetened iced tea.  Keep in mind that regular soda, juice, and other mixers may contain a lot of sugar and must be counted as carbs. What are tips for following this plan?  Reading food labels  Start by checking the serving size on the "Nutrition Facts" label of packaged foods and drinks. The amount of calories, carbs, fats, and other nutrients listed on the label is based on one serving of the item. Many items contain more than one serving per package.  Check the total grams (g) of carbs in one serving. You can calculate the number of servings of carbs in one serving by dividing the total carbs by 15. For example, if a food has 30 g of total carbs, it  would be equal to 2 servings of carbs.  Check the number of grams (g) of saturated and trans fats in one serving. Choose foods that have low or no amount of these fats.  Check the number of milligrams (mg) of salt (sodium) in one serving. Most people should limit total sodium intake to less than 2,300 mg per day.  Always check the nutrition information of foods labeled as "low-fat" or "nonfat". These foods may be higher in added sugar or refined carbs and should be avoided.  Talk to your dietitian to identify your daily goals for nutrients listed on the label. Shopping  Avoid buying canned, premade, or processed foods. These foods tend to be high in fat, sodium, and added sugar.  Shop around the outside edge of the grocery store. This includes fresh fruits and vegetables, bulk grains, fresh meats, and fresh dairy. Cooking  Use low-heat cooking methods, such as baking, instead of high-heat cooking methods like deep frying.  Cook using healthy oils, such as olive, canola, or sunflower oil.  Avoid cooking with butter, cream, or high-fat meats. Meal planning  Eat meals and snacks regularly, preferably at the same times every day. Avoid going long periods of time without eating.  Eat foods high in fiber, such as fresh fruits, vegetables, beans, and whole grains. Talk to your dietitian about how many servings  of carbs you can eat at each meal.  Eat 4-6 ounces (oz) of lean protein each day, such as lean meat, chicken, fish, eggs, or tofu. One oz of lean protein is equal to: ? 1 oz of meat, chicken, or fish. ? 1 egg. ?  cup of tofu.  Eat some foods each day that contain healthy fats, such as avocado, nuts, seeds, and fish. Lifestyle  Check your blood glucose regularly.  Exercise regularly as told by your health care provider. This may include: ? 150 minutes of moderate-intensity or vigorous-intensity exercise each week. This could be brisk walking, biking, or water  aerobics. ? Stretching and doing strength exercises, such as yoga or weightlifting, at least 2 times a week.  Take medicines as told by your health care provider.  Do not use any products that contain nicotine or tobacco, such as cigarettes and e-cigarettes. If you need help quitting, ask your health care provider.  Work with a Veterinary surgeon or diabetes educator to identify strategies to manage stress and any emotional and social challenges. Questions to ask a health care provider  Do I need to meet with a diabetes educator?  Do I need to meet with a dietitian?  What number can I call if I have questions?  When are the best times to check my blood glucose? Where to find more information:  American Diabetes Association: diabetes.org  Academy of Nutrition and Dietetics: www.eatright.[employer] of Diabetes and Digestive and Kidney Diseases (NIH): CarFlippers.tn Summary  A healthy meal plan will help you control your blood glucose and maintain a healthy lifestyle.  Working with a diet and nutrition specialist (dietitian) can help you make a meal plan that is best for you.  Keep in mind that carbohydrates (carbs) and alcohol have immediate effects on your blood glucose levels. It is important to count carbs and to use alcohol carefully. This information is not intended to replace advice given to you by your health care provider. Make sure you discuss any questions you have with your health care provider. Document Revised: 01/30/2017 Document Reviewed: 03/24/2016 Elsevier Patient Education  2020 [employer].

## 2019-11-02 ENCOUNTER — Other Ambulatory Visit: Payer: Self-pay | Admitting: Internal Medicine

## 2019-11-02 ENCOUNTER — Telehealth: Payer: Self-pay

## 2019-11-02 ENCOUNTER — Telehealth: Payer: Medicare PPO

## 2019-11-02 NOTE — Telephone Encounter (Cosign Needed Addendum)
  Chronic Care Management   Outreach Note  11/02/2019 Name: Helen Richardson MRN: [id number] DOB: 1933/10/11  Referred by: Dorothyann Peng, MD Reason for referral : Chronic Care Management (FU RN CM Call )   An unsuccessful telephone outreach was attempted today. The patient was referred to the case management team for assistance with care management and care coordination.   Follow Up Plan: A HIPPA compliant phone message was left for the patient providing contact information and requesting a return call.  Telephone follow up appointment with care management team member scheduled for: 12/06/19  Delsa Sale, RN, BSN, CCM Care Management Coordinator [place] [facility]  Direct Phone: [phone]

## 2019-11-04 ENCOUNTER — Other Ambulatory Visit: Payer: Self-pay

## 2019-11-04 ENCOUNTER — Other Ambulatory Visit: Payer: Medicare PPO | Admitting: Hospice

## 2019-11-04 DIAGNOSIS — Z515 Encounter for palliative care: Secondary | ICD-10-CM | POA: Diagnosis not present

## 2019-11-04 DIAGNOSIS — I509 Heart failure, unspecified: Secondary | ICD-10-CM

## 2019-11-04 NOTE — Progress Notes (Signed)
Therapist, nutritional Palliative Care Consult Note Telephone: [phone]  Fax: [phone]  PATIENT NAME: Helen Richardson DOB: 1934/03/03 MRN: [id number]  PRIMARY CARE PROVIDER:   Dorothyann Peng, MD  REFERRING PROVIDER: Dorothyann Peng, MD  RESPONSIBLE PARTY:Self [phone] Contact:Brandon Houtzdale, grandsonat [phone] Lessie Dings, caregiver [phone]- close family friend [name]  [phone]-caregiver  TELEHEALTH VISIT STATEMENT Due to the COVID-19 crisis, this visit was done via telephone from my office. It was initiated and consented to by this patient and/or family.   RECOMMENDATIONS/PLAN:   Advance Care Planning:  Visit consisted of building trust and discussions on Palliative Medicine as specialized medical care for people living with serious illness, aimed at facilitating better quality of life through symptoms relief, assisting with advance care plan and establishing goals of care.   Code Status: CODE STATUS reviewed today.  Patient affirms she is a full code  Goals of Care: Goals of care include to maximize quality of life and symptom management.  Follow up: Palliative care will continue to follow patient for goals of care clarification and symptom management.  Follow-up in 3 months Symptom management: Patient in no medical acuity.  She denies pain/discomfort, coughing, difficulty breathing, signs and symptoms of hypoglycemia/hyper glycemia.  No fall/hospitalizations since last visit.  Caregiver continues to check her blood sugar, results within normal range.  She continues on [place] as ordered.  She reported blood pressure has been optimal and states she continues with her blood pressure medications as ordered.  She also continues on Lasix, denied edema.  She reported appetite is good.  Patient has no concerns at this time; said overall she is doing well.  [name] continues to come to help patient but not at home at this  time. Family /Caregiver/Community Supports:  Brandon-patient's son-lives at home with patient.  Caregiver [name] comes to help patient with activities of daily living. I spent 35 minutes providing this consultation; time iincludes time spent with patient/family, chart review, provider coordination,  and documentation. More than 50% of the time in this consultation was spent on coordinating communication  HISTORY OF PRESENT ILLNESS: HISTORY OF PRESENT ILLNESS:Helen H Chavisis a 86 y.o.year oldfemalewith multiple medical problems including DMT2, CHF, HTN. Palliative Care was asked to help address goals of care.  CODE STATUS: Full  PPS: 50% HOSPICE ELIGIBILITY/DIAGNOSIS: TBD  PAST MEDICAL HISTORY:  Past Medical History:  Diagnosis Date  . CHF (congestive heart failure) (HCC)   . Diabetes mellitus without complication (HCC)   . High cholesterol   . HTN (hypertension)   . Obesity     SOCIAL HX:  Social History   Tobacco Use  . Smoking status: Never Smoker  . Smokeless tobacco: Never Used  Substance Use Topics  . Alcohol use: No    ALLERGIES:  Allergies  Allergen Reactions  . Tradjenta [Linagliptin] Swelling     PERTINENT MEDICATIONS:  Outpatient Encounter Medications as of 11/04/2019  Medication Sig  . Alcohol Swabs (ALCOHOL PREP) 70 % PADS   . aspirin 81 MG chewable tablet Chew 81 mg by mouth at bedtime.  . Calcium Carbonate-Vitamin D (CALTRATE 600+D PO) Take 1 tablet by mouth daily.  . carvedilol (COREG) 6.25 MG tablet TAKE 1 TABLET BY MOUTH TWICE DAILY  . diclofenac sodium (VOLTAREN) 1 % GEL APPLY TO AFFECTED AREA 3 TIMES DAILY AS NEEDED (Patient taking differently: Apply 2 g topically 3 (three) times daily as needed (pain). )  . furosemide (LASIX) 40 MG tablet TAKE  1 TABLET(40 MG) BY MOUTH DAILY  . glucose blood test strip Test up to 4 times daily  . polyethylene glycol powder (GLYCOLAX/MIRALAX) 17 GM/SCOOP powder polyethylene glycol 3350 17 gram/dose oral powder  MIX 1  CAPFUL IN DRINK AND TK PO 1 TO 3 TIMES DAILY PRF DAILY SOFT STOOLS  . potassium chloride SA (KLOR-CON) 20 MEQ tablet Take 1 tablet (20 mEq total) by mouth once for 1 dose. Please d/c 2 rx for 2 times per day thanks  . rosuvastatin (CRESTOR) 10 MG tablet TAKE 1 TABLET BY MOUTH MONDAY-FRIDAY. DO NOT TAKE ON SATURDAY OR SUNDAY  . rosuvastatin (CRESTOR) 20 MG tablet TAKE 1 TABLET BY MOUTH EVERY DAY  . telmisartan (MICARDIS) 80 MG tablet TAKE 1 TABLET(80 MG) BY MOUTH DAILY  . TRADJENTA 5 MG TABS tablet TK 1 T PO QD  . TRESIBA FLEXTOUCH 200 UNIT/ML SOPN ADMINISTER 34 UNITS UNDER THE SKIN EVERY EVENING. MAX TITRATION- 80 UNITS  . UNABLE TO FIND lancets 30 gauge  . UNABLE TO FIND Comfort EZ Pen Needles 32 gauge x 5/32"   No facility-administered encounter medications on file as of 11/04/2019.    Helen Carpenter, NP

## 2019-11-08 ENCOUNTER — Other Ambulatory Visit: Payer: Self-pay

## 2019-11-08 ENCOUNTER — Ambulatory Visit: Payer: Medicare PPO

## 2019-11-08 DIAGNOSIS — E1122 Type 2 diabetes mellitus with diabetic chronic kidney disease: Secondary | ICD-10-CM

## 2019-11-08 DIAGNOSIS — Z794 Long term (current) use of insulin: Secondary | ICD-10-CM

## 2019-11-08 DIAGNOSIS — E7800 Pure hypercholesterolemia, unspecified: Secondary | ICD-10-CM

## 2019-11-08 DIAGNOSIS — I10 Essential (primary) hypertension: Secondary | ICD-10-CM

## 2019-11-08 NOTE — Chronic Care Management (AMB) (Signed)
Chronic Care Management Pharmacy  Name: Helen Richardson  MRN: [id number] DOB: 10-12-1933  Chief Complaint/ HPI  Helen Richardson,  86 y.o. , female presents for their Initial CCM visit with the clinical pharmacist via telephone due to COVID-19 Pandemic. Call completed with patient's nurse, Karena Addison.   PCP : Glendale Chard, MD  Their chronic conditions include: Hypertension, Hyperlipidemia and Diabetes  Office Visits: 12/14/18 OV: DM/HTN follow up. Pt complains of memory issues, feels she is more forgetful. Pt encouraged to keep track of daily blood sugars. HgbA1c has decreased to 9.7% (goal <8%). HTN fair control. Check RPR and TSH to determine if contributing to memory issues. Thyroid function normal. Liver and kidney function stable. Cholesterol is great. Encouraged chair exercises 5 times weekly. HD flu vaccine.   Consult Visits: 11/04/19 Palliative Care  07/12/19 Podiatry OV w/ Dr. Prudence Davidson: Presented for at risk foot care. Mechanical debridement of nails 1-5 bilaterally. Return in 3 months.   07/06/19 Palliative Care: Goals of care addressed. Educations provided regarding diet (avoiding sweets an high calorie foods)  05/09/19 Palliative Care: Goals of care include to maximize quality of life and symptom management  CCM Encounters:  Medications: Outpatient Encounter Medications as of 11/08/2019  Medication Sig Note  . Alcohol Swabs (ALCOHOL PREP) 70 % PADS  03/10/2013: Received from: External Pharmacy  . aspirin 81 MG chewable tablet Chew 81 mg by mouth at bedtime.   . Calcium Carbonate-Vitamin D (CALTRATE 600+D PO) Take 1 tablet by mouth daily.   . carvedilol (COREG) 6.25 MG tablet TAKE 1 TABLET BY MOUTH TWICE DAILY   . diclofenac sodium (VOLTAREN) 1 % GEL APPLY TO AFFECTED AREA 3 TIMES DAILY AS NEEDED (Patient taking differently: Apply 2 g topically 3 (three) times daily as needed (pain). )   . furosemide (LASIX) 40 MG tablet TAKE 1 TABLET(40 MG) BY MOUTH DAILY   . glucose blood test strip  Test up to 4 times daily   . polyethylene glycol powder (GLYCOLAX/MIRALAX) 17 GM/SCOOP powder polyethylene glycol 3350 17 gram/dose oral powder  MIX 1 CAPFUL IN DRINK AND TK PO 1 TO 3 TIMES DAILY PRF DAILY SOFT STOOLS   . potassium chloride SA (KLOR-CON) 20 MEQ tablet Take 1 tablet (20 mEq total) by mouth once for 1 dose. Please d/c 2 rx for 2 times per day thanks   . rosuvastatin (CRESTOR) 10 MG tablet TAKE 1 TABLET BY MOUTH MONDAY-FRIDAY. DO NOT TAKE ON SATURDAY OR SUNDAY   . rosuvastatin (CRESTOR) 20 MG tablet TAKE 1 TABLET BY MOUTH EVERY DAY   . telmisartan (MICARDIS) 80 MG tablet TAKE 1 TABLET(80 MG) BY MOUTH DAILY   . TRADJENTA 5 MG TABS tablet TK 1 T PO QD   . TRESIBA FLEXTOUCH 200 UNIT/ML SOPN ADMINISTER 34 UNITS UNDER THE SKIN EVERY EVENING. MAX TITRATION- 80 UNITS   . UNABLE TO FIND lancets 30 gauge   . UNABLE TO FIND Comfort EZ Pen Needles 32 gauge x 5/32"    No facility-administered encounter medications on file as of 11/08/2019.    Current Diagnosis/Assessment:  SDOH Interventions     Most Recent Value  SDOH Interventions  Financial Strain Interventions Intervention Not Indicated      Goals Addressed            This Visit's Progress   . Pharmacy Care Plan       CARE PLAN ENTRY (see longitudinal plan of care for additional care plan information)  Current Barriers:  . Chronic Disease Management  support, education, and care coordination needs related to Hypertension, Hyperlipidemia, and Diabetes   Hypertension BP Readings from Last 3 Encounters:  12/14/18 140/72  07/12/18 120/84  07/02/18 (!) 130/58   . Pharmacist Clinical Goal(s): o Over the next 90 days, patient will work with PharmD and providers to maintain BP goal <130/80 . Current regimen:  o Telmisartan 73m daily o Furosemide 429mdaily o Carvedilol 6.2558mwice daily o Potassium 48m27maily . Interventions: o Collaborate with PCP staff to schedule patient for follow up office visit and labwork to  determine potassium dosing . Patient self care activities - Over the next 90 days, patient will: o Check BP daily, document, and provide at future appointments o Ensure daily salt intake < 2300 mg/day o Try to exercise for 30 minutes 5 times weekly  Hyperlipidemia Lab Results  Component Value Date/Time   LDLCALC 70 12/21/2018 03:50 PM   . Pharmacist Clinical Goal(s): o Over the next 180 days, patient will work with PharmD and providers to maintain LDL goal < 70 . Current regimen:  o Rosuvastatin 48mg5mly Monday through Friday . Interventions: o Collaborate with PCP to determine correct dosing for rosuvastatin - 10mg 6m48mg l26md in chart as well as daily and Monday through Friday - Will ensure pharmacy is filling correct prescription for rosuvastatin . Patient self care activities - Over the next 180 days, patient will: o Take cholesterol medication daily as directed o Try to exercise for 30 minutes 5 times weekly  Diabetes Lab Results  Component Value Date/Time   HGBA1C 9.7 (H) 12/21/2018 03:50 PM   HGBA1C 10.2 (H) 07/12/2018 02:44 PM   . Pharmacist Clinical Goal(s): o Over the next 90 days, patient will work with PharmD and providers to achieve A1c goal <8% . Current regimen:  o Tresiba 200 units/ml 36 units daily . Interventions: o Discussed diet and exercise extensively o Determined patient stopped Tradjenta, will mark for removal from medication list o Determined current dosing of Tresiba . Patient self care activities - Over the next 90 days, patient will: o Check blood sugar once daily, document, and provide at future appointments o Contact provider with any episodes of hypoglycemia o Try to exercise for 30 minutes 5 times weekly  Medication management . Pharmacist Clinical Goal(s): o Over the next 180 days, patient will work with PharmD and providers to maintain optimal medication adherence . Current pharmacy: [facility] . Interventions o Comprehensive  medication review performed. o Utilize [facility] for medication synchronization, packaging and delivery . Patient self care activities - Over the next 180 days, patient will: o Focus on medication adherence by utilizing medication synchronization, adherence packaging, and delivery o Take medications as prescribed o Report any questions or concerns to PharmD and/or provider(s)  Initial goal documentation        Diabetes   A1c goal <8%  Recent Relevant Labs: Lab Results  Component Value Date/Time   HGBA1C 9.7 (H) 12/21/2018 03:50 PM   HGBA1C 10.2 (H) 07/12/2018 02:44 PM    Last diabetic Eye exam:  Lab Results  Component Value Date/Time   HMDIABEYEEXA Retinopathy (A) 09/28/2018 12:00 AM    Last diabetic Foot exam: Followed by Podiatry regularly  Checking BG: Daily  Recent FBG Readings: Average 130-134, 118 this morning Recent pre-meal BG readings:  Recent 2hr PP BG readings:   Recent HS BG readings:   Patient has failed these meds in past: Tradjenta, Humalog Mix Kwikpen Patient is currently uncontrolled on the following medications: .  Tresiba 200 units/ml 36 units daily  We discussed:  . Per [name], pt administers Tresiba 36 units in the morning . Diet extensively o Breakfast: Grits, egg, bacon, toast o Snacks: Yogurt, crackers o Dinner: Varies, [employer], [employer], Programmer, systems  . Exercise extensively o Pt is able to exercise, walks around house o Physical therapy comes to the home sometimes . [facility] [place] was stopped due to side effects, but still on medication list  Plan Continue current medications   Hyperlipidemia   LDL goal < 70  Lipid Panel     Component Value Date/Time   CHOL 153 12/21/2018 1550   TRIG 97 12/21/2018 1550   HDL 65 12/21/2018 1550   LDLCALC 70 12/21/2018 1550    Hepatic Function Latest Ref Rng & Units 12/21/2018 06/11/2018 06/10/2018  Total Protein 6.0 - 8.5 g/dL 6.3 6.3(L) 6.4(L)  Albumin 3.6 - 4.6 g/dL 3.6 2.0(L) 2.0(L)  AST 0 -  40 IU/L _0 ALT 0 - 32 IU/L _1 Alk Phosphatase 39 - 117 IU/L 71 67 61  Total Bilirubin 0.0 - 1.2 mg/dL 0.2 0.5 0.7  Bilirubin, Direct 0.0 - 0.2 mg/dL - - 0.3(H)     The ASCVD Risk score Mikey Bussing DC Jr., et al., 2013) failed to calculate for the following reasons:   The 2013 ASCVD risk score is only valid for ages 63 to 30   Patient has failed these meds in past: N/A Patient is currently controlled on the following medications:  . Rosuvastatin 47m daily Monday through Friday   We discussed:   Diet and exercise extensively . Pt is taking cholesterol medication Monday through Friday  Plan Continue current medications  Collaborate with PCP regarding correct does of rosuvastatin 173mvs 2014mnd daily versus Monday through Friday  Hypertension   BP goal is:  <130/80  Office blood pressures are  BP Readings from Last 3 Encounters:  12/14/18 140/72  07/12/18 120/84  07/02/18 (!) 130/58   Patient checks BP at home daily Patient home BP readings are ranging: 130-140/70  Patient has failed these meds in the past: Losartan Patient is currently controlled on the following medications:  . Telmisartan 31m21mily . Furosemide 40mg63mly . Carvedilol 6.25mg 37me daily . Potassium 20mEq 43my  We discussed: . BP is fairly well controlled at home . Per [name], ptKarena Addisons changed to potassium 1 tablet daily instead of twice daily o Inquired when next office visit was and when labwork needs to be done o Collaborated with PCP staff to contact pt to schedule office visit and labwork to determine dosing for potassium . Relayed to [name] thaHerrimanppt scheduled for 9/14  Plan Continue current medications   Health Maintenance   Patient is currently on the following medications:  . Aspirin 81 mg daily . Caltrate 600+ D daily  We discussed:   . Pt takes OTC supplements daily . Would like included in adherence packaging  Plan Continue current medications   Vaccines   Reviewed and  discussed patient's vaccination history.    Immunization History  Administered Date(s) Administered  . Influenza, High Dose Seasonal PF 11/19/2017, 12/14/2018   Plan Review and discuss at follow up  Medication Management   [facility] Uses pill box? Yes Pt endorses 100% compliance  We discussed:  . Importance of taking each medications daily as directed . Medication are administered by home health nurse . Adherence packaging, medication synchronization and delivery offered  by [facility]   Plan Utilize [facility] for medication synchronization, packaging and delivery   Verbal consent obtained for [facility] enhanced pharmacy services (medication synchronization, adherence packaging, delivery coordination). A medication sync plan was created to allow patient to get all medications delivered once every 30 to 90 days per patient preference. Patient understands they have freedom to choose pharmacy and clinical pharmacist will coordinate care between all prescribers and [facility].  Follow up: 8 week phone visit  Jannette Fogo, PharmD Clinical Pharmacist [facility] [phone]

## 2019-11-08 NOTE — Patient Instructions (Addendum)
Visit Information  Goals Addressed            This Visit's Progress   . Pharmacy Care Plan       CARE PLAN ENTRY (see longitudinal plan of care for additional care plan information)  Current Barriers:  . Chronic Disease Management support, education, and care coordination needs related to Hypertension, Hyperlipidemia, and Diabetes   Hypertension BP Readings from Last 3 Encounters:  12/14/18 140/72  07/12/18 120/84  07/02/18 (!) 130/58   . Pharmacist Clinical Goal(s): o Over the next 90 days, patient will work with PharmD and providers to maintain BP goal <130/80 . Current regimen:  o Telmisartan 80mg  daily o Furosemide 40mg  daily o Carvedilol 6.25mg  twice daily o Potassium daily . Interventions: o Collaborate with PCP staff to schedule patient for follow up office visit and labwork to determine potassium dosing . Patient self care activities - Over the next 90 days, patient will: o Check BP daily, document, and provide at future appointments o Ensure daily salt intake < 2300 mg/day o Try to exercise for 30 minutes 5 times weekly  Hyperlipidemia Lab Results  Component Value Date/Time   LDLCALC 70 12/21/2018 03:50 PM   . Pharmacist Clinical Goal(s): o Over the next 180 days, patient will work with PharmD and providers to maintain LDL goal < 70 . Current regimen:  o Rosuvastatin 20mg  daily Monday through Friday . Interventions: o Collaborate with PCP to determine correct dosing for rosuvastatin - 10mg  and 20mg  listed in chart as well as daily and Monday through Friday - Will ensure pharmacy is filling correct prescription for rosuvastatin . Patient self care activities - Over the next 180 days, patient will: o Take cholesterol medication daily as directed o Try to exercise for 30 minutes 5 times weekly  Diabetes Lab Results  Component Value Date/Time   HGBA1C 9.7 (H) 12/21/2018 03:50 PM   HGBA1C 10.2 (H) 07/12/2018 02:44 PM   . Pharmacist Clinical  Goal(s): o Over the next 90 days, patient will work with PharmD and providers to achieve A1c goal <8% . Current regimen:  o Tresiba 200 units/ml 36 units daily . Interventions: o Discussed diet and exercise extensively o Determined patient stopped Tradjenta, will mark for removal from medication list o Determined current dosing of Tresiba . Patient self care activities - Over the next 90 days, patient will: o Check blood sugar once daily, document, and provide at future appointments o Contact provider with any episodes of hypoglycemia o Try to exercise for 30 minutes 5 times weekly  Medication management . Pharmacist Clinical Goal(s): o Over the next 180 days, patient will work with PharmD and providers to maintain optimal medication adherence . Current pharmacy: [facility] . Interventions o Comprehensive medication review performed. o Utilize [facility] pharmacy for medication synchronization, packaging and delivery . Patient self care activities - Over the next 180 days, patient will: o Focus on medication adherence by utilizing medication synchronization, adherence packaging, and delivery o Take medications as prescribed o Report any questions or concerns to PharmD and/or provider(s)  Initial goal documentation        Ms. Bettenhausen was given information about Chronic Care Management services today including:  1. CCM service includes personalized support from designated clinical staff supervised by her physician, including individualized plan of care and coordination with other care providers 2. 24/7 contact phone numbers for assistance for urgent and routine care needs. 3. Standard insurance, coinsurance, copays and deductibles apply for chronic care management only during  months in which we provide at least 20 minutes of these services. Most insurances cover these services at 100%, however patients may be responsible for any copay, coinsurance and/or deductible if applicable. This  service may help you avoid the need for more expensive face-to-face services. 4. Only one practitioner may furnish and bill the service in a calendar month. 5. The patient may stop CCM services at any time (effective at the end of the month) by phone call to the office staff.  Patient agreed to services and verbal consent obtained.   The patient verbalized understanding of instructions provided today and agreed to receive a mailed copy of patient instruction and/or educational materials. Telephone follow up appointment with pharmacy team member scheduled for: 12/30/19 @ 12:30 PM  Beryle Flock, PharmD Clinical Pharmacist [facility] [phone]    Diabetes Mellitus and Nutrition, Adult When you have diabetes (diabetes mellitus), it is very important to have healthy eating habits because your blood sugar (glucose) levels are greatly affected by what you eat and drink. Eating healthy foods in the appropriate amounts, at about the same times every day, can help you:  Control your blood glucose.  Lower your risk of heart disease.  Improve your blood pressure.  Reach or maintain a healthy weight. Every person with diabetes is different, and each person has different needs for a meal plan. Your health care provider may recommend that you work with a diet and nutrition specialist (dietitian) to make a meal plan that is best for you. Your meal plan may vary depending on factors such as:  The calories you need.  The medicines you take.  Your weight.  Your blood glucose, blood pressure, and cholesterol levels.  Your activity level.  Other health conditions you have, such as heart or kidney disease. How do carbohydrates affect me? Carbohydrates, also called carbs, affect your blood glucose level more than any other type of food. Eating carbs naturally raises the amount of glucose in your blood. Carb counting is a method for keeping track of how many carbs you  eat. Counting carbs is important to keep your blood glucose at a healthy level, especially if you use insulin or take certain oral diabetes medicines. It is important to know how many carbs you can safely have in each meal. This is different for every person. Your dietitian can help you calculate how many carbs you should have at each meal and for each snack. Foods that contain carbs include:  Bread, cereal, rice, pasta, and crackers.  Potatoes and corn.  Peas, beans, and lentils.  Milk and yogurt.  Fruit and juice.  Desserts, such as cakes, cookies, ice cream, and candy. How does alcohol affect me? Alcohol can cause a sudden decrease in blood glucose (hypoglycemia), especially if you use insulin or take certain oral diabetes medicines. Hypoglycemia can be a life-threatening condition. Symptoms of hypoglycemia (sleepiness, dizziness, and confusion) are similar to symptoms of having too much alcohol. If your health care provider says that alcohol is safe for you, follow these guidelines:  Limit alcohol intake to no more than 1 drink per day for nonpregnant women and 2 drinks per day for men. One drink equals 12 oz of beer, 5 oz of wine, or 1 oz of hard liquor.  Do not drink on an empty stomach.  Keep yourself hydrated with water, diet soda, or unsweetened iced tea.  Keep in mind that regular soda, juice, and other mixers may contain a lot of sugar and  must be counted as carbs. What are tips for following this plan?  Reading food labels  Start by checking the serving size on the "Nutrition Facts" label of packaged foods and drinks. The amount of calories, carbs, fats, and other nutrients listed on the label is based on one serving of the item. Many items contain more than one serving per package.  Check the total grams (g) of carbs in one serving. You can calculate the number of servings of carbs in one serving by dividing the total carbs by 15. For example, if a food has 30 g of total  carbs, it would be equal to 2 servings of carbs.  Check the number of grams (g) of saturated and trans fats in one serving. Choose foods that have low or no amount of these fats.  Check the number of milligrams (mg) of salt (sodium) in one serving. Most people should limit total sodium intake to less than 2,300 mg per day.  Always check the nutrition information of foods labeled as "low-fat" or "nonfat". These foods may be higher in added sugar or refined carbs and should be avoided.  Talk to your dietitian to identify your daily goals for nutrients listed on the label. Shopping  Avoid buying canned, premade, or processed foods. These foods tend to be high in fat, sodium, and added sugar.  Shop around the outside edge of the grocery store. This includes fresh fruits and vegetables, bulk grains, fresh meats, and fresh dairy. Cooking  Use low-heat cooking methods, such as baking, instead of high-heat cooking methods like deep frying.  Cook using healthy oils, such as olive, canola, or sunflower oil.  Avoid cooking with butter, cream, or high-fat meats. Meal planning  Eat meals and snacks regularly, preferably at the same times every day. Avoid going long periods of time without eating.  Eat foods high in fiber, such as fresh fruits, vegetables, beans, and whole grains. Talk to your dietitian about how many servings of carbs you can eat at each meal.  Eat 4-6 ounces (oz) of lean protein each day, such as lean meat, chicken, fish, eggs, or tofu. One oz of lean protein is equal to: ? 1 oz of meat, chicken, or fish. ? 1 egg. ?  cup of tofu.  Eat some foods each day that contain healthy fats, such as avocado, nuts, seeds, and fish. Lifestyle  Check your blood glucose regularly.  Exercise regularly as told by your health care provider. This may include: ? 150 minutes of moderate-intensity or vigorous-intensity exercise each week. This could be brisk walking, biking, or water  aerobics. ? Stretching and doing strength exercises, such as yoga or weightlifting, at least 2 times a week.  Take medicines as told by your health care provider.  Do not use any products that contain nicotine or tobacco, such as cigarettes and e-cigarettes. If you need help quitting, ask your health care provider.  Work with a Veterinary surgeon or diabetes educator to identify strategies to manage stress and any emotional and social challenges. Questions to ask a health care provider  Do I need to meet with a diabetes educator?  Do I need to meet with a dietitian?  What number can I call if I have questions?  When are the best times to check my blood glucose? Where to find more information:  American Diabetes Association: diabetes.org  Academy of Nutrition and Dietetics: www.eatright.[employer] of Diabetes and Digestive and Kidney Diseases (NIH): CarFlippers.tn Summary  A healthy  meal plan will help you control your blood glucose and maintain a healthy lifestyle.  Working with a diet and nutrition specialist (dietitian) can help you make a meal plan that is best for you.  Keep in mind that carbohydrates (carbs) and alcohol have immediate effects on your blood glucose levels. It is important to count carbs and to use alcohol carefully. This information is not intended to replace advice given to you by your health care provider. Make sure you discuss any questions you have with your health care provider. Document Revised: 01/30/2017 Document Reviewed: 03/24/2016 Elsevier Patient Education  2020 [employer].

## 2019-11-15 ENCOUNTER — Ambulatory Visit: Payer: Medicare PPO | Admitting: Nurse Practitioner

## 2019-11-16 ENCOUNTER — Other Ambulatory Visit: Payer: Self-pay | Admitting: Internal Medicine

## 2019-11-18 DIAGNOSIS — E119 Type 2 diabetes mellitus without complications: Secondary | ICD-10-CM | POA: Diagnosis not present

## 2019-11-18 DIAGNOSIS — Z794 Long term (current) use of insulin: Secondary | ICD-10-CM | POA: Diagnosis not present

## 2019-12-01 DIAGNOSIS — E119 Type 2 diabetes mellitus without complications: Secondary | ICD-10-CM | POA: Diagnosis not present

## 2019-12-01 DIAGNOSIS — Z6834 Body mass index (BMI) 34.0-34.9, adult: Secondary | ICD-10-CM | POA: Diagnosis not present

## 2019-12-01 DIAGNOSIS — I10 Essential (primary) hypertension: Secondary | ICD-10-CM | POA: Diagnosis not present

## 2019-12-01 DIAGNOSIS — E785 Hyperlipidemia, unspecified: Secondary | ICD-10-CM | POA: Diagnosis not present

## 2019-12-01 DIAGNOSIS — Z7409 Other reduced mobility: Secondary | ICD-10-CM | POA: Diagnosis not present

## 2019-12-01 DIAGNOSIS — Z794 Long term (current) use of insulin: Secondary | ICD-10-CM | POA: Diagnosis not present

## 2019-12-01 DIAGNOSIS — R609 Edema, unspecified: Secondary | ICD-10-CM | POA: Diagnosis not present

## 2019-12-01 DIAGNOSIS — E669 Obesity, unspecified: Secondary | ICD-10-CM | POA: Diagnosis not present

## 2019-12-01 DIAGNOSIS — R32 Unspecified urinary incontinence: Secondary | ICD-10-CM | POA: Diagnosis not present

## 2019-12-02 ENCOUNTER — Telehealth: Payer: Self-pay

## 2019-12-02 NOTE — Chronic Care Management (AMB) (Signed)
Chronic Care Management Pharmacy Assistant   Name: GWENDY BOEDER  MRN: [id number] DOB: October 03, 1933  Reason for Encounter: Medication Review/ Monthly Dispensing Call  PCP : Dorothyann Peng, MD  Allergies:   Allergies  Allergen Reactions  . Tradjenta [Linagliptin] Swelling    Medications: Outpatient Encounter Medications as of 12/02/2019  Medication Sig Note  . Alcohol Swabs (ALCOHOL PREP) 70 % PADS  03/10/2013: Received from: External Pharmacy  . aspirin 81 MG chewable tablet Chew 81 mg by mouth at bedtime.   . Calcium Carbonate-Vitamin D (CALTRATE 600+D PO) Take 1 tablet by mouth daily.   . carvedilol (COREG) 6.25 MG tablet TAKE 1 TABLET BY MOUTH TWICE DAILY   . diclofenac sodium (VOLTAREN) 1 % GEL APPLY TO AFFECTED AREA 3 TIMES DAILY AS NEEDED (Patient taking differently: Apply 2 g topically 3 (three) times daily as needed (pain). )   . furosemide (LASIX) 40 MG tablet TAKE 1 TABLET(40 MG) BY MOUTH DAILY   . glucose blood test strip Test up to 4 times daily   . polyethylene glycol powder (GLYCOLAX/MIRALAX) 17 GM/SCOOP powder polyethylene glycol 3350 17 gram/dose oral powder  MIX 1 CAPFUL IN DRINK AND TK PO 1 TO 3 TIMES DAILY PRF DAILY SOFT STOOLS   . potassium chloride SA (KLOR-CON) 20 MEQ tablet Take 1 tablet (20 mEq total) by mouth once for 1 dose. Please d/c 2 rx for 2 times per day thanks   . rosuvastatin (CRESTOR) 10 MG tablet TAKE 1 TABLET BY MOUTH MONDAY-FRIDAY. DO NOT TAKE ON SATURDAY OR SUNDAY   . rosuvastatin (CRESTOR) 20 MG tablet TAKE 1 TABLET BY MOUTH EVERY DAY   . telmisartan (MICARDIS) 80 MG tablet TAKE 1 TABLET(80 MG) BY MOUTH DAILY   . TRADJENTA 5 MG TABS tablet TK 1 T PO QD   . TRESIBA FLEXTOUCH 200 UNIT/ML FlexTouch Pen INJECT 36 UNITS UNDER THE SKIN DAILY. MAX TITRATION 80 UNITS   . UNABLE TO FIND lancets 30 gauge   . UNABLE TO FIND Comfort EZ Pen Needles 32 gauge x 5/32"    No facility-administered encounter medications on file as of 12/02/2019.    Current  Diagnosis: Patient Active Problem List   Diagnosis Date Noted  . Controlled type 2 diabetes mellitus with stable proliferative retinopathy of both eyes, with long-term current use of insulin (HCC) 07/04/2019  . Intermediate stage nonexudative age-related macular degeneration of both eyes 07/04/2019  . Posterior vitreous detachment of right eye 07/04/2019  . Degenerative retinal drusen of both eyes 07/04/2019  . Chorioretinal scar 07/04/2019  . Pincer nail deformity 04/12/2019  . AKI (acute kidney injury) (HCC) 06/11/2018  . Sepsis due to gram-negative UTI (HCC) 06/11/2018  . Acute lower UTI (urinary tract infection) 06/10/2018  . HTN (hypertension) 06/10/2018  . Joint swelling 06/10/2018  . Hyperkalemia 06/10/2018  . Hyponatremia 06/10/2018  . Normocytic anemia 06/10/2018  . Type 2 diabetes mellitus with stage 3 chronic kidney disease, with long-term current use of insulin (HCC) 03/08/2018  . 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 Pure hypercholesterolemia 03/08/2018  . Class 3 severe obesity due to excess calories with serious comorbidity and body mass index (BMI) of 40.0 to 44.9 in adult [facility]) 03/08/2018    Goals Addressed   None    Reviewed chart for medication changes ahead of medication coordination call.  No OVs, Consults, or hospital visits since last care coordination call/Pharmacist visit.  No medication changes indicated.   BP Readings from Last 3 Encounters:  12/14/18 140/72  07/12/18 120/84  07/02/18 (!) 130/58    Lab Results  Component Value Date   HGBA1C 9.7 (H) 12/21/2018     Patient obtains medications through Adherence Packaging  30 Days   Last adherence delivery on 11/10/2019 included: Carvedilol 6.25 mg- 1 tablet twice daily (before breakfast and evening meal), Telmisartan 80 mg - 1 tablet daily (before breakfast), Rosuvastatin 20 mg- 1 tablet daily (bedtime), Furosemide 40 mg- 1 tablet daily (breakfast).  Patient declined Potassium 20 meq- 1  tablet daily (breakfast) last month due to receiving a prescription from [facility] on 10/19/2019 90 DS.   Patient is due for next adherence delivery on: 12/11/2019 . Called patient, spoke with granddaughter Morrie Sheldon and reviewed medications and coordinated delivery.  This delivery to include: Carvedilol 6.25 mg- 1 tablet twice daily (before breakfast and evening meal), Telmisartan 80mg  - 1 tablet daily (before breakfast), Rosuvastatin 20 mg- 1 tablet daily (bedtime).  Patient declined the following medications Potassium 20 meq- 1 tablet daily (breakfast) due to receiving a prescription from [facility] on 10/19/2019 90 DS.   Patient needs refills for: Telmisartan 80mg  - 1 tablet daily (before breakfast), Rosuvastatin 20 mg- 1 tablet daily (bedtime), Furosemide 40 mg- 1 tablet daily (breakfast).  Confirmed delivery date of 12/11/19, advised patient that pharmacy will contact them the morning of delivery.  Follow-Up:  Coordination of Enhanced Pharmacy Services and Pharmacist Review- Granddaughter stated that patient's pen needles for 02/10/20 were expensive from [facility], they paid $80 and she is wondering how much they would be with [facility]. Informed granddaughter I will inquire with [facility] pricing on their pen needles. Also will notify Morrie Sheldon, CPP. May need prescription from PCP sent to [facility] pharmacy. Called [facility] to inquire on refills of Tresiba, patient just picked up Beryle Flock on 11/19/19 for a 45 day supply, does have a refill left. Reminder set for the end of October to get new prescription sent to [facility] to be sent with patient's next delivery.  11/21/19, CMA Clinical Pharmacist Assistant [phone]

## 2019-12-06 ENCOUNTER — Telehealth: Payer: Medicare PPO

## 2019-12-07 ENCOUNTER — Ambulatory Visit: Payer: Self-pay

## 2019-12-07 ENCOUNTER — Telehealth: Payer: Medicare PPO

## 2019-12-07 ENCOUNTER — Other Ambulatory Visit: Payer: Self-pay

## 2019-12-07 DIAGNOSIS — I10 Essential (primary) hypertension: Secondary | ICD-10-CM

## 2019-12-07 DIAGNOSIS — Z794 Long term (current) use of insulin: Secondary | ICD-10-CM

## 2019-12-07 DIAGNOSIS — E1122 Type 2 diabetes mellitus with diabetic chronic kidney disease: Secondary | ICD-10-CM

## 2019-12-07 DIAGNOSIS — I509 Heart failure, unspecified: Secondary | ICD-10-CM

## 2019-12-07 MED ORDER — INSULIN PEN NEEDLE 32G X 4 MM MISC: 3 refills | Status: DC

## 2019-12-09 NOTE — Chronic Care Management (AMB) (Signed)
Chronic Care Management   Follow Up Note   12/07/2019 Name: Helen Richardson MRN: [id number] DOB: 1933/06/19  Referred by: Glendale Chard, MD Reason for referral : Chronic Care Management (CCM RN CM FU Call )   Helen Richardson is a 86 y.o. year old female who is a primary care patient of Glendale Chard, MD. The CCM team was consulted for assistance with chronic disease management and care coordination needs.    Review of patient status, including review of consultants reports, relevant laboratory and other test results, and collaboration with appropriate care team members and the patient's provider was performed as part of comprehensive patient evaluation and provision of chronic care management services.    SDOH (Social Determinants of Health) assessments performed: Yes - no acute challenges identified at this time See Care Plan activities for detailed interventions related to [place])   Placed outbound CCM RN CM follow up call to granddaughter Caryl Pina for a care plan update.     Outpatient Encounter Medications as of 12/07/2019  Medication Sig Note  . Alcohol Swabs (ALCOHOL PREP) 70 % PADS  03/10/2013: Received from: External Pharmacy  . aspirin 81 MG chewable tablet Chew 81 mg by mouth at bedtime.   . Calcium Carbonate-Vitamin D (CALTRATE 600+D PO) Take 1 tablet by mouth daily.   . carvedilol (COREG) 6.25 MG tablet TAKE 1 TABLET BY MOUTH TWICE DAILY   . diclofenac sodium (VOLTAREN) 1 % GEL APPLY TO AFFECTED AREA 3 TIMES DAILY AS NEEDED (Patient taking differently: Apply 2 g topically 3 (three) times daily as needed (pain). )   . furosemide (LASIX) 40 MG tablet TAKE 1 TABLET(40 MG) BY MOUTH DAILY   . glucose blood test strip Test up to 4 times daily   . Insulin Pen Needle 32G X 4 MM MISC Use with insulin Dx code e11.65   . polyethylene glycol powder (GLYCOLAX/MIRALAX) 17 GM/SCOOP powder polyethylene glycol 3350 17 gram/dose oral powder  MIX 1 CAPFUL IN DRINK AND TK PO 1 TO 3 TIMES DAILY PRF  DAILY SOFT STOOLS   . rosuvastatin (CRESTOR) 10 MG tablet TAKE 1 TABLET BY MOUTH MONDAY-FRIDAY. DO NOT TAKE ON SATURDAY OR SUNDAY   . rosuvastatin (CRESTOR) 20 MG tablet TAKE 1 TABLET BY MOUTH EVERY DAY   . telmisartan (MICARDIS) 80 MG tablet TAKE 1 TABLET(80 MG) BY MOUTH DAILY   . TRADJENTA 5 MG TABS tablet TK 1 T PO QD   . TRESIBA FLEXTOUCH 200 UNIT/ML FlexTouch Pen INJECT 36 UNITS UNDER THE SKIN DAILY. MAX TITRATION 80 UNITS   . UNABLE TO FIND lancets 30 gauge   . UNABLE TO FIND Comfort EZ Pen Needles 32 gauge x 5/32"    No facility-administered encounter medications on file as of 12/07/2019.     Objective:  Lab Results  Component Value Date   HGBA1C 9.7 (H) 12/21/2018   HGBA1C 10.2 (H) 07/12/2018   HGBA1C 12.1 (H) 06/11/2018   Lab Results  Component Value Date   LDLCALC 70 12/21/2018   CREATININE 0.78 02/22/2019   BP Readings from Last 3 Encounters:  12/14/18 140/72  07/12/18 120/84  07/02/18 (!) 130/58    Goals Addressed      Patient Stated   .  "I am having more arthritis pain in my arms and legs" (pt-stated)   On track     Current Barriers:  . Poor adherence to following a HEP . Osteoarthritis  . Impaired Physical Mobility   Nurse Case Manager Clinical Goal(s):  .  Over the next 60 days, patient will verbalize understanding of plan for resumption of in home PT to evaluate and treat impaired physical mobility, poor gait, decreased ROM  Goal Met  New 08/31/19 Over the next 30 days, patient will start in home PT/OT through [facility] to improve her strength, balance, stamina and mobility. Goal Met  CCM RN CM Interventions:  12/07/19 completed call with granddaughter Caryl Pina  . Evaluation of current treatment plan related to Osteoarthritis pain and Impaired Physical Mobility and patient's adherence to plan as established by provider . Determined granddaughter Caryl Pina feels her grandmother is doing very well at this time and is ambulating within her home w/o difficulty  or falls . Determined patient has been discharged from in home PT/OT and her endurance is fair at this time, she is able to participate with self care at this time  . Advised granddaughter Caryl Pina she should call the [place] office to schedule her grandmother's AWV that is due this month, Caryl Pina states she will schedule her appointment   . Discussed plans with patient for ongoing care management follow up and provided patient with direct contact information for care management team  Patient Self Care Activities:  . Patient is currently UNABLE TO independently provide self-care or self administer medications  Please see past updates related to this goal by clicking on the "Past Updates" button in the selected goal        Other   .  COMPLETED: "I think my grandmother has CHF"        CARE PLAN ENTRY (see longitudinal plan of care for additional care plan information  Current Barriers:  [name] Kitchen Knowledge Deficits related to disease process and Self Health management of CHF . Chronic Disease Management support and education needs related to DM, HTN, s/p fall, Medication Management, CHF  Nurse Case Manager Clinical Goal(s):  [name] Kitchen New 08/31/19 Over the next 90 days, patient and caregiver will work with the CCM team and PCP to improve Self Health management of CHF as evidence by patient will experience having no CHF exacerbations or hospitalizations Goal Met  CCM RN CM Interventions:  12/07/19 completed call with granddaughter Caryl Pina  . Evaluation of current treatment plan related to CHF and patient's adherence to plan as established by provider . Determined granddaughter Caryl Pina feels her grandmother's CHF is stable at this time with no reported symptoms suggestive of CHF . Determined she has caregiver assistance to cover all daytime hours needed and her weights are being monitored daily as directed  . Reiterated the importance of having patient follow a strict low Sodium diet to help avoid fluid retention  and elevated BP . Discussed plans with patient for ongoing care management follow up and provided patient with direct contact information for care management team  Patient Self Care Activities:  Patient is currently UNABLE TO independently provide self-care or self administer medications  Please see past updates related to this goal by clicking on the "Past Updates" button in the selected goal        Plan:   Telephone follow up appointment with care management team member scheduled for: 01/18/20  Barb Merino, RN, BSN, CCM Care Management Coordinator [place] [facility]  Direct Phone: [phone]

## 2019-12-09 NOTE — Patient Instructions (Signed)
Visit Information  Goals Addressed      Patient Stated   .  "I am having more arthritis pain in my arms and legs" (pt-stated)   On track     Current Barriers:  . Poor adherence to following a HEP . Osteoarthritis  . Impaired Physical Mobility   Nurse Case Manager Clinical Goal(s):  [name] Kitchen Over the next 60 days, patient will verbalize understanding of plan for resumption of in home PT to evaluate and treat impaired physical mobility, poor gait, decreased ROM  Goal Met  New 08/31/19 Over the next 30 days, patient will start in home PT/OT through [facility] to improve her strength, balance, stamina and mobility. Goal Met  CCM RN CM Interventions:  12/07/19 completed call with granddaughter Caryl Pina  . Evaluation of current treatment plan related to Osteoarthritis pain and Impaired Physical Mobility and patient's adherence to plan as established by provider . Determined granddaughter Caryl Pina feels her grandmother is doing very well at this time and is ambulating within her home w/o difficulty or falls . Determined patient has been discharged from in home PT/OT and her endurance is fair at this time, she is able to participate with self care at this time  . Advised granddaughter Caryl Pina she should call the [place] office to schedule her grandmother's AWV that is due this month, Caryl Pina states she will schedule her appointment   . Discussed plans with patient for ongoing care management follow up and provided patient with direct contact information for care management team  Patient Self Care Activities:  . Patient is currently UNABLE TO independently provide self-care or self administer medications  Please see past updates related to this goal by clicking on the "Past Updates" button in the selected goal        Other   .  COMPLETED: "I think my grandmother has CHF"        CARE PLAN ENTRY (see longitudinal plan of care for additional care plan information  Current Barriers:  [name] Kitchen Knowledge Deficits  related to disease process and Self Health management of CHF . Chronic Disease Management support and education needs related to DM, HTN, s/p fall, Medication Management, CHF  Nurse Case Manager Clinical Goal(s):  [name] Kitchen New 08/31/19 Over the next 90 days, patient and caregiver will work with the CCM team and PCP to improve Self Health management of CHF as evidence by patient will experience having no CHF exacerbations or hospitalizations Goal Met  CCM RN CM Interventions:  12/07/19 completed call with granddaughter Caryl Pina  . Evaluation of current treatment plan related to CHF and patient's adherence to plan as established by provider . Determined granddaughter Caryl Pina feels her grandmother's CHF is stable at this time with no reported symptoms suggestive of CHF . Determined she has caregiver assistance to cover all daytime hours needed and her weights are being monitored daily as directed  . Reiterated the importance of having patient follow a strict low Sodium diet to help avoid fluid retention and elevated BP . Discussed plans with patient for ongoing care management follow up and provided patient with direct contact information for care management team  Patient Self Care Activities:  Patient is currently UNABLE TO independently provide self-care or self administer medications  Please see past updates related to this goal by clicking on the "Past Updates" button in the selected goal        Patient verbalizes understanding of instructions provided today.   Telephone follow up appointment with care management team member  scheduled for: 01/18/20  Barb Merino, RN, BSN, CCM Care Management Coordinator [place] [facility]  Direct Phone: [phone]

## 2019-12-12 DIAGNOSIS — E1165 Type 2 diabetes mellitus with hyperglycemia: Secondary | ICD-10-CM | POA: Diagnosis not present

## 2019-12-12 DIAGNOSIS — E119 Type 2 diabetes mellitus without complications: Secondary | ICD-10-CM | POA: Diagnosis not present

## 2019-12-21 ENCOUNTER — Telehealth: Payer: Self-pay

## 2019-12-21 NOTE — Telephone Encounter (Signed)
I left a message with pt's granddaughter asking her to call and schedule AWV with [place].

## 2019-12-29 ENCOUNTER — Telehealth: Payer: Self-pay | Admitting: Pharmacist

## 2019-12-29 NOTE — Chronic Care Management (AMB) (Signed)
Chronic Care Management Pharmacy Assistant   Name: SHARRI LOYA  MRN: [id number] DOB: Mar 28, 1933  Reason for Encounter: Medication Review-  Medication Adherence.   lPCP : Dorothyann Peng, MD  Allergies:   Allergies  Allergen Reactions  . Tradjenta [Linagliptin] Swelling    Medications: Outpatient Encounter Medications as of 12/29/2019  Medication Sig Note  . Alcohol Swabs (ALCOHOL PREP) 70 % PADS  03/10/2013: Received from: External Pharmacy  . aspirin 81 MG chewable tablet Chew 81 mg by mouth at bedtime.   . Calcium Carbonate-Vitamin D (CALTRATE 600+D PO) Take 1 tablet by mouth daily.   . carvedilol (COREG) 6.25 MG tablet TAKE 1 TABLET BY MOUTH TWICE DAILY   . diclofenac sodium (VOLTAREN) 1 % GEL APPLY TO AFFECTED AREA 3 TIMES DAILY AS NEEDED (Patient taking differently: Apply 2 g topically 3 (three) times daily as needed (pain). )   . furosemide (LASIX) 40 MG tablet TAKE 1 TABLET(40 MG) BY MOUTH DAILY   . glucose blood test strip Test up to 4 times daily   . Insulin Pen Needle 32G X 4 MM MISC Use with insulin Dx code e11.65   . polyethylene glycol powder (GLYCOLAX/MIRALAX) 17 GM/SCOOP powder polyethylene glycol 3350 17 gram/dose oral powder  MIX 1 CAPFUL IN DRINK AND TK PO 1 TO 3 TIMES DAILY PRF DAILY SOFT STOOLS   . potassium chloride SA (KLOR-CON) 20 MEQ tablet Take 1 tablet (20 mEq total) by mouth once for 1 dose. Please d/c 2 rx for 2 times per day thanks   . rosuvastatin (CRESTOR) 10 MG tablet TAKE 1 TABLET BY MOUTH MONDAY-FRIDAY. DO NOT TAKE ON SATURDAY OR SUNDAY   . rosuvastatin (CRESTOR) 20 MG tablet TAKE 1 TABLET BY MOUTH EVERY DAY   . telmisartan (MICARDIS) 80 MG tablet TAKE 1 TABLET(80 MG) BY MOUTH DAILY   . TRADJENTA 5 MG TABS tablet TK 1 T PO QD   . TRESIBA FLEXTOUCH 200 UNIT/ML FlexTouch Pen INJECT 36 UNITS UNDER THE SKIN DAILY. MAX TITRATION 80 UNITS   . UNABLE TO FIND lancets 30 gauge   . UNABLE TO FIND Comfort EZ Pen Needles 32 gauge x 5/32"    No  facility-administered encounter medications on file as of 12/29/2019.    Current Diagnosis: Patient Active Problem List   Diagnosis Date Noted  . Controlled type 2 diabetes mellitus with stable proliferative retinopathy of both eyes, with long-term current use of insulin (HCC) 07/04/2019  . Intermediate stage nonexudative age-related macular degeneration of both eyes 07/04/2019  . Posterior vitreous detachment of right eye 07/04/2019  . Degenerative retinal drusen of both eyes 07/04/2019  . Chorioretinal scar 07/04/2019  . Pincer nail deformity 04/12/2019  . AKI (acute kidney injury) (HCC) 06/11/2018  . Sepsis due to gram-negative UTI (HCC) 06/11/2018  . Acute lower UTI (urinary tract infection) 06/10/2018  . HTN (hypertension) 06/10/2018  . Joint swelling 06/10/2018  . Hyperkalemia 06/10/2018  . Hyponatremia 06/10/2018  . Normocytic anemia 06/10/2018  . Type 2 diabetes mellitus with stage 3 chronic kidney disease, with long-term current use of insulin (HCC) 03/08/2018  . 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 Pure hypercholesterolemia 03/08/2018  . Class 3 severe obesity due to excess calories with serious comorbidity and body mass index (BMI) of 40.0 to 44.9 in adult [facility]) 03/08/2018      Follow-Up:  Pharmacist Review - Called Pharmacy spoke to [facility] to have patient's mediation,[facility] [place], address and phone/ fax number was given to  [place] . Deland states that patient did have refills and he will send over today within a few hours.  Reviewed chart and adherence measures . Per [employer], medication adherence for cholesterol (statins) is 100% med compliance , medication adherence for hypertension is 100% med compliance.  Christian Davis,CPP . Notified  Jon Gills, [facility] Clinical Pharmacist Assistant [phone]

## 2019-12-30 ENCOUNTER — Telehealth: Payer: Self-pay

## 2020-01-04 ENCOUNTER — Telehealth: Payer: Self-pay | Admitting: Pharmacist

## 2020-01-04 NOTE — Chronic Care Management (AMB) (Signed)
Chronic Care Management Pharmacy Assistant   Name: Helen Richardson  MRN: [id number] DOB: [birth year]  Reason for Encounter: Medication Review/ Monthly Dispensing Call  Patient Questions:  1.  Have you seen any other providers since your last visit? Yes, 10/06/2021Delsa Sale, RN (CCM Nurse)  2.  Any changes in your medicines or health? No   PCP : Dorothyann Peng, MD  Allergies:   Allergies  Allergen Reactions  . Tradjenta [Linagliptin] Swelling    Medications: Outpatient Encounter Medications as of 01/04/2020  Medication Sig Note  . Alcohol Swabs (ALCOHOL PREP) 70 % PADS  03/10/2013: Received from: External Pharmacy  . aspirin 81 MG chewable tablet Chew 81 mg by mouth at bedtime.   . Calcium Carbonate-Vitamin D (CALTRATE 600+D PO) Take 1 tablet by mouth daily.   . carvedilol (COREG) 6.25 MG tablet TAKE 1 TABLET BY MOUTH TWICE DAILY   . diclofenac sodium (VOLTAREN) 1 % GEL APPLY TO AFFECTED AREA 3 TIMES DAILY AS NEEDED (Patient taking differently: Apply 2 g topically 3 (three) times daily as needed (pain). )   . furosemide (LASIX) 40 MG tablet TAKE 1 TABLET(40 MG) BY MOUTH DAILY   . glucose blood test strip Test up to 4 times daily   . Insulin Pen Needle 32G X 4 MM MISC Use with insulin Dx code e11.65   . polyethylene glycol powder (GLYCOLAX/MIRALAX) 17 GM/SCOOP powder polyethylene glycol 3350 17 gram/dose oral powder  MIX 1 CAPFUL IN DRINK AND TK PO 1 TO 3 TIMES DAILY PRF DAILY SOFT STOOLS   . potassium chloride SA (KLOR-CON) 20 MEQ tablet Take 1 tablet (20 mEq total) by mouth once for 1 dose. Please d/c 2 rx for 2 times per day thanks   . rosuvastatin (CRESTOR) 10 MG tablet TAKE 1 TABLET BY MOUTH MONDAY-FRIDAY. DO NOT TAKE ON SATURDAY OR SUNDAY   . rosuvastatin (CRESTOR) 20 MG tablet TAKE 1 TABLET BY MOUTH EVERY DAY   . telmisartan (MICARDIS) 80 MG tablet TAKE 1 TABLET(80 MG) BY MOUTH DAILY   . TRADJENTA 5 MG TABS tablet TK 1 T PO QD   . TRESIBA FLEXTOUCH 200 UNIT/ML FlexTouch  Pen INJECT 36 UNITS UNDER THE SKIN DAILY. MAX TITRATION 80 UNITS   . UNABLE TO FIND lancets 30 gauge   . UNABLE TO FIND Comfort EZ Pen Needles 32 gauge x 5/32"    No facility-administered encounter medications on file as of 01/04/2020.    Current Diagnosis: Patient Active Problem List   Diagnosis Date Noted  . Controlled type 2 diabetes mellitus with stable proliferative retinopathy of both eyes, with long-term current use of insulin (HCC) 07/04/2019  . Intermediate stage nonexudative age-related macular degeneration of both eyes 07/04/2019  . Posterior vitreous detachment of right eye 07/04/2019  . Degenerative retinal drusen of both eyes 07/04/2019  . Chorioretinal scar 07/04/2019  . Pincer nail deformity 04/12/2019  . AKI (acute kidney injury) (HCC) 06/11/2018  . Sepsis due to gram-negative UTI (HCC) 06/11/2018  . Acute lower UTI (urinary tract infection) 06/10/2018  . HTN (hypertension) 06/10/2018  . Joint swelling 06/10/2018  . Hyperkalemia 06/10/2018  . Hyponatremia 06/10/2018  . Normocytic anemia 06/10/2018  . Type 2 diabetes mellitus with stage 3 chronic kidney disease, with long-term current use of insulin (HCC) 03/08/2018  . 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 Pure hypercholesterolemia 03/08/2018  . Class 3 severe obesity due to excess calories with serious comorbidity and body mass index (BMI) of 40.0 to 44.9 in adult (  HCC) 03/08/2018   Reviewed chart for medication changes ahead of medication coordination call.  OV- 12/07/2019- Angel Little,RN (CCM) since last care coordination call/Pharmacist visit. No medication changes indicated.  BP Readings from Last 3 Encounters:  12/14/18 140/72  07/12/18 120/84  07/02/18 (!) 130/58    Lab Results  Component Value Date   HGBA1C 9.7 (H) 12/21/2018     Patient obtains medications through Adherence Packaging  30 Days   Last adherence delivery included: Carvedilol 6.25 mg- 1 tablet twice daily (before breakfast and  evening meal), Telmisartan 80 mg - 1 tablet daily (before breakfast), Rosuvastatin 20 mg- 1 tablet daily (bedtime).   Patient declined  Potassium 20 meq- 1 tablet daily (breakfast) due to receiving a prescription from [facility] on 10/19/2019 90 DS.  Patient is due for next adherence delivery on: 01/10/2020. 01/04/2020- Left message twice for granddaughter to return call to review medications and coordinate delivery. Work/home number incorrect. 01/05/2020- Called granddaughter Morrie Sheldon, left message to return call. 01/06/2020- Missed call from granddaughter Morrie Sheldon, returned call, reviewed medications and coordinated delivery. 01/06/2020- Spoke with granddaughter Morrie Sheldon, unable to review medications, informed to call patient's home [phone] and speak with Home Health Nurse- [name]. Spoke with [place], reviewed medications and coordinated delivery.  This delivery to include: Carvedilol 6.25 mg- 1 tablet twice daily (before breakfast and evening meal)  Telmisartan 80 mg - 1 tablet daily (before breakfast)  Rosuvastatin 20 mg- 1 tablet daily (bedtime) Potassium 20 meq- 1 tablet daily (breakfast) Furosemide 40 mg- 1 tablet daily (before breakfast)  No Short fill or Acute fill needed.  Patient did not decline any medications.  No refills needed.  Confirmed delivery date of 01/10/2020, advised patient that pharmacy will contact them the morning of delivery.  Follow-Up:  Coordination of Enhanced Pharmacy Services and Pharmacist Review  Pharmacy received a call that patient was almost out of medications, after review, patient is not out of any medications, has 8 days left on packaging and 6 days left on Potassium. Patient will have delivery of medications prior to running out. Patient would like delivery to arrive before 11 am while Home Health Nurse is there. Erskine Emery, CPP notified.  [name], CMA Clinical Pharmacist Assistant [phone]

## 2020-01-11 ENCOUNTER — Other Ambulatory Visit: Payer: Self-pay | Admitting: Internal Medicine

## 2020-01-16 ENCOUNTER — Ambulatory Visit: Payer: Self-pay | Admitting: Internal Medicine

## 2020-01-18 ENCOUNTER — Ambulatory Visit: Payer: Self-pay

## 2020-01-18 ENCOUNTER — Other Ambulatory Visit: Payer: Self-pay

## 2020-01-18 ENCOUNTER — Telehealth: Payer: Medicare PPO

## 2020-01-18 DIAGNOSIS — I509 Heart failure, unspecified: Secondary | ICD-10-CM

## 2020-01-18 DIAGNOSIS — E1122 Type 2 diabetes mellitus with diabetic chronic kidney disease: Secondary | ICD-10-CM

## 2020-01-18 DIAGNOSIS — Z794 Long term (current) use of insulin: Secondary | ICD-10-CM

## 2020-01-18 DIAGNOSIS — I10 Essential (primary) hypertension: Secondary | ICD-10-CM

## 2020-01-23 NOTE — Patient Instructions (Addendum)
Visit Information  Goals Addressed      Patient Stated   .  COMPLETED: "I am having more arthritis pain in my arms and legs" (pt-stated)        Current Barriers:  . Poor adherence to following a HEP . Osteoarthritis  . Impaired Physical Mobility   Nurse Case Manager Clinical Goal(s):  [name] Kitchen Over the next 60 days, patient will verbalize understanding of plan for resumption of in home PT to evaluate and treat impaired physical mobility, poor gait, decreased ROM  Goal Met  New 08/31/19 Over the next 30 days, patient will start in home PT/OT through [facility] to improve her strength, balance, stamina and mobility. Goal Met  CCM RN CM Interventions:  01/18/20 completed call with patient  . Evaluation of current treatment plan related to Osteoarthritis pain and Impaired Physical Mobility and patient's adherence to plan as established by provider . Determined patient feels her arthritis is well managed at this time with no recent flares . Determined patient is ambulating with use of her walker and denies having any recent falls . Determined patient has a supportive family who take shifts to provide the care and supervision she needs to allow her a good quality of life . Discussed plans with patient for ongoing care management follow up and provided patient with direct contact information for care management team  Patient Self Care Activities:  . Patient is currently UNABLE TO independently provide self-care or self administer medications  Please see past updates related to this goal by clicking on the "Past Updates" button in the selected goal      .  "to stay healthy" (pt-stated)        [place] (see longitudinal plan of care for additional care plan information)  Current Barriers:  . Ineffective Health Maintenance (Patient is past due for her AWV) . Chronic Disease Management support and education needs related to DM, HTN, s/p fall, Medication Management, CHF  Nurse Case  Manager Clinical Goal(s):  [name] Kitchen Over the next 90 days, patient will verbalize understanding of plan for follow up with PCP for AWV  Interventions:  01/18/20 call completed with patient  . Inter-disciplinary care team collaboration (see longitudinal plan of care) . Determined patient feels her care needs are being met with through her family members who alternate their time with her, granddaughter Caryl Pina continues to provide transportation for MD appointments, however she is often limited with availability due to returning to college . Determined patient would like resources for transportation, in basket message sent to embedded BSW to assist  . Determined patient has all DME needed and is able to participate in her Self Care, her goal is to maintain the ability to perform Self Care and stay healthy  . Educated patient on importance of staying well hydrated and eating a well balanced diet while adhering to her ADA diet . Determined patient is past due for her AWV and should contact the office to schedule this visit; Determined granddaughter Caryl Pina will call to schedule  . Discussed plans with patient for ongoing care management follow up and provided patient with direct contact information for care management team  Patient Self Care Activities:  . Call pharmacy for medication refills . Call provider office for new concerns or questions . Supportive family to assist with care needs   Initial goal documentation       The patient verbalized understanding of instructions, educational materials, and care plan provided today and declined offer to  receive copy of patient instructions, educational materials, and care plan.   Telephone follow up appointment with care management team member scheduled for: 03/08/20  Barb Merino, RN, BSN, CCM Care Management Coordinator [place] [facility]  Direct Phone: [phone]

## 2020-01-23 NOTE — Chronic Care Management (AMB) (Signed)
Chronic Care Management   Follow Up Note   01/18/2020 Name: Helen Richardson MRN: [id number] DOB: 1933/04/06  Referred by: Glendale Chard, MD Reason for referral : Chronic Care Management (RNCM FU Call)   Helen Richardson is a 86 y.o. year old female who is a primary care patient of Glendale Chard, MD. The CCM team was consulted for assistance with chronic disease management and care coordination needs.    Review of patient status, including review of consultants reports, relevant laboratory and other test results, and collaboration with appropriate care team members and the patient's provider was performed as part of comprehensive patient evaluation and provision of chronic care management services.    SDOH (Social Determinants of Health) assessments performed: Yes - transportation  See Care Plan activities for detailed interventions related to [place])   Placed CCM RN CM outbound call to patient for a care plan update.    Outpatient Encounter Medications as of 01/18/2020  Medication Sig Note  . Alcohol Swabs (ALCOHOL PREP) 70 % PADS  03/10/2013: Received from: External Pharmacy  . aspirin 81 MG chewable tablet Chew 81 mg by mouth at bedtime.   . Calcium Carbonate-Vitamin D (CALTRATE 600+D PO) Take 1 tablet by mouth daily.   . carvedilol (COREG) 6.25 MG tablet TAKE 1 TABLET BY MOUTH TWICE DAILY   . diclofenac sodium (VOLTAREN) 1 % GEL APPLY TO AFFECTED AREA 3 TIMES DAILY AS NEEDED (Patient taking differently: Apply 2 g topically 3 (three) times daily as needed (pain). )   . furosemide (LASIX) 40 MG tablet TAKE 1 TABLET(40 MG) BY MOUTH DAILY   . glucose blood test strip Test up to 4 times daily   . Insulin Pen Needle 32G X 4 MM MISC Use with insulin Dx code e11.65   . polyethylene glycol powder (GLYCOLAX/MIRALAX) 17 GM/SCOOP powder polyethylene glycol 3350 17 gram/dose oral powder  MIX 1 CAPFUL IN DRINK AND TK PO 1 TO 3 TIMES DAILY PRF DAILY SOFT STOOLS   . rosuvastatin (CRESTOR) 10 MG  tablet TAKE 1 TABLET BY MOUTH MONDAY-FRIDAY. DO NOT TAKE ON SATURDAY OR SUNDAY   . rosuvastatin (CRESTOR) 20 MG tablet TAKE 1 TABLET BY MOUTH EVERY DAY   . telmisartan (MICARDIS) 80 MG tablet TAKE 1 TABLET(80 MG) BY MOUTH DAILY   . TRADJENTA 5 MG TABS tablet TK 1 T PO QD   . TRESIBA FLEXTOUCH 200 UNIT/ML FlexTouch Pen INJECT 36 UNITS UNDER THE SKIN DAILY. MAX TITRATION 80 UNITS   . UNABLE TO FIND lancets 30 gauge   . UNABLE TO FIND Comfort EZ Pen Needles 32 gauge x 5/32"    No facility-administered encounter medications on file as of 01/18/2020.     Objective:  Lab Results  Component Value Date   HGBA1C 9.7 (H) 12/21/2018   HGBA1C 10.2 (H) 07/12/2018   HGBA1C 12.1 (H) 06/11/2018   Lab Results  Component Value Date   LDLCALC 70 12/21/2018   CREATININE 0.78 02/22/2019   BP Readings from Last 3 Encounters:  12/14/18 140/72  07/12/18 120/84  07/02/18 (!) 130/58    Goals Addressed      Patient Stated   .  COMPLETED: "I am having more arthritis pain in my arms and legs" (pt-stated)        Current Barriers:  . Poor adherence to following a HEP . Osteoarthritis  . Impaired Physical Mobility   Nurse Case Manager Clinical Goal(s):  [name] Kitchen Over the next 60 days, patient will verbalize understanding of plan for  resumption of in home PT to evaluate and treat impaired physical mobility, poor gait, decreased ROM  Goal Met  New 08/31/19 Over the next 30 days, patient will start in home PT/OT through [facility] to improve her strength, balance, stamina and mobility. Goal Met  CCM RN CM Interventions:  01/18/20 completed call with patient  . Evaluation of current treatment plan related to Osteoarthritis pain and Impaired Physical Mobility and patient's adherence to plan as established by provider . Determined patient feels her arthritis is well managed at this time with no recent flares . Determined patient is ambulating with use of her walker and denies having any recent  falls . Determined patient has a supportive family who take shifts to provide the care and supervision she needs to allow her a good quality of life . Discussed plans with patient for ongoing care management follow up and provided patient with direct contact information for care management team  Patient Self Care Activities:  . Patient is currently UNABLE TO independently provide self-care or self administer medications  Please see past updates related to this goal by clicking on the "Past Updates" button in the selected goal      .  "to stay healthy" (pt-stated)        [place] (see longitudinal plan of care for additional care plan information)  Current Barriers:  . Ineffective Health Maintenance (Patient is past due for her AWV) . Chronic Disease Management support and education needs related to DM, HTN, s/p fall, Medication Management, CHF  Nurse Case Manager Clinical Goal(s):  [name] Kitchen Over the next 90 days, patient will verbalize understanding of plan for follow up with PCP for AWV  Interventions:  01/18/20 call completed with patient  . Inter-disciplinary care team collaboration (see longitudinal plan of care) . Determined patient feels her care needs are being met with through her family members who alternate their time with her, granddaughter Caryl Pina continues to provide transportation for MD appointments, however she is often limited with availability due to returning to college . Determined patient would like resources for transportation, in basket message sent to embedded BSW to assist  . Determined patient has all DME needed and is able to participate in her Self Care, her goal is to maintain the ability to perform Self Care and stay healthy  . Educated patient on importance of staying well hydrated and eating a well balanced diet while adhering to her ADA diet . Determined patient is past due for her AWV and should contact the office to schedule this visit; Determined  granddaughter Caryl Pina will call to schedule  . Discussed plans with patient for ongoing care management follow up and provided patient with direct contact information for care management team  Patient Self Care Activities:  . Call pharmacy for medication refills . Call provider office for new concerns or questions . Supportive family to assist with care needs   Initial goal documentation        Plan:   Telephone follow up appointment with care management team member scheduled for: 03/08/20  Barb Merino, RN, BSN, CCM Care Management Coordinator [place] [facility]  Direct Phone: [phone]

## 2020-02-02 ENCOUNTER — Ambulatory Visit: Payer: Medicare PPO

## 2020-02-02 DIAGNOSIS — N183 Chronic kidney disease, stage 3 unspecified: Secondary | ICD-10-CM

## 2020-02-02 DIAGNOSIS — I509 Heart failure, unspecified: Secondary | ICD-10-CM

## 2020-02-02 DIAGNOSIS — I10 Essential (primary) hypertension: Secondary | ICD-10-CM

## 2020-02-02 DIAGNOSIS — E1122 Type 2 diabetes mellitus with diabetic chronic kidney disease: Secondary | ICD-10-CM

## 2020-02-03 ENCOUNTER — Telehealth: Payer: Self-pay

## 2020-02-03 NOTE — Patient Instructions (Signed)
Patient Goals/Self-Care Activities . Over the next 90 days, patient will:   - Attend all scheduled provider appointments -Call pharmacy for medication refills -Call provider office for new concerns or questions -Contact SW as needed prior to next scheduled call 

## 2020-02-03 NOTE — Chronic Care Management (AMB) (Signed)
Chronic Care Management    Social Work Follow Up Note  02/02/2020 Name: Helen Richardson MRN: [id number] DOB: [birth year]  Helen Richardson is a 86 y.o. year old female who is a primary care patient of Dorothyann Peng, MD. The CCM team was consulted for assistance with care coordination.   Review of patient status, including review of consultants reports, other relevant assessments, and collaboration with appropriate care team members and the patient's provider was performed as part of comprehensive patient evaluation and provision of chronic care management services.    SDOH (Social Determinants of Health) assessments performed: Yes SDOH Interventions     Most Recent Value  SDOH Interventions  Transportation Interventions Patient Refused  [Attempted to assist with a SCAT application,  patient grand-daughter/caregiver stated communication has improved and her aunt is now assisting with transportation needs]       Outpatient Encounter Medications as of 02/02/2020  Medication Sig Note  . Alcohol Swabs (ALCOHOL PREP) 70 % PADS  03/10/2013: Received from: External Pharmacy  . aspirin 81 MG chewable tablet Chew 81 mg by mouth at bedtime.   . Calcium Carbonate-Vitamin D (CALTRATE 600+D PO) Take 1 tablet by mouth daily.   . carvedilol (COREG) 6.25 MG tablet TAKE 1 TABLET BY MOUTH TWICE DAILY   . diclofenac sodium (VOLTAREN) 1 % GEL APPLY TO AFFECTED AREA 3 TIMES DAILY AS NEEDED (Patient taking differently: Apply 2 g topically 3 (three) times daily as needed (pain). )   . furosemide (LASIX) 40 MG tablet TAKE 1 TABLET(40 MG) BY MOUTH DAILY   . glucose blood test strip Test up to 4 times daily   . Insulin Pen Needle 32G X 4 MM MISC Use with insulin Dx code e11.65   . polyethylene glycol powder (GLYCOLAX/MIRALAX) 17 GM/SCOOP powder polyethylene glycol 3350 17 gram/dose oral powder  MIX 1 CAPFUL IN DRINK AND TK PO 1 TO 3 TIMES DAILY PRF DAILY SOFT STOOLS   . rosuvastatin (CRESTOR) 10 MG tablet TAKE 1  TABLET BY MOUTH MONDAY-FRIDAY. DO NOT TAKE ON SATURDAY OR SUNDAY   . rosuvastatin (CRESTOR) 20 MG tablet TAKE 1 TABLET BY MOUTH EVERY DAY   . telmisartan (MICARDIS) 80 MG tablet TAKE 1 TABLET(80 MG) BY MOUTH DAILY   . TRADJENTA 5 MG TABS tablet TK 1 T PO QD   . TRESIBA FLEXTOUCH 200 UNIT/ML FlexTouch Pen INJECT 36 UNITS UNDER THE SKIN DAILY. MAX TITRATION 80 UNITS   . UNABLE TO FIND lancets 30 gauge   . UNABLE TO FIND Comfort EZ Pen Needles 32 gauge x 5/32"    No facility-administered encounter medications on file as of 02/02/2020.     Patient Care Plan: Social Work Care Plan    Problem Identified: Care Coordination     Long-Range Goal: Collaborate with RN Care Manager to perform appropriate assessments to assist with care coordination needs   Start Date: 02/02/2020  Priority: Low  Note:   Current Barriers:  . Chronic disease management support and education needs related to CHF, HTN, and DMII  . Transportation and Limited access to caregiver  Social Worker Clinical Goal(s):  [name] Kitchen Over the next 120 days, patient will work with SW to identify and address any acute and/or chronic care coordination needs related to the self health management of CHF, HTN, and DMII   CCM SW Interventions:  . Inter-disciplinary care team collaboration (see longitudinal plan of care) . Collaboration with Delsa Sale RN Care Manager who indicates patient is in need of transportation resources -  patient requested SW outreach her grand-daughter Morrie Sheldon . Successful outbound call placed to [facility] to assist with resource needs . Determined the patient is no longer in need of transportation resources as Ashley's aunt has made herself available to assist . Truddie Crumble SW is able to complete a SCAT application if she would like the patient to engage in this service in the future . Discussed long term follow up with SW while patient remains actively involved with  RN Case Manager  to address care management needs .  Scheduled follow up call over the next 90 days  Patient Goals/Self-Care Activities . Over the next 90 days, patient will:   - Attend all scheduled provider appointments -Call pharmacy for medication refills -Call provider office for new concerns or questions -Contact SW as needed prior to next scheduled call  Follow Up Plan: SW will follow up with the patient over the next 90 days       Bevelyn Ngo, BSW, CDP Social Worker, Certified Dementia Practitioner [facility] Care Management [phone]  Total time spent performing care coordination and/or care management activities with the patient by phone or face to face = 15 minutes.

## 2020-02-03 NOTE — Chronic Care Management (AMB) (Signed)
Chronic Care Management Pharmacy Assistant   Name: Helen Richardson  MRN: [id number] DOB: Nov 23, 1933  Reason for Encounter: Medication Review  Patient Questions:  1.  Have you seen any other providers since your last visit? Yes, 01/18/2020- Angel Little (CCM)  2.  Any changes in your medicines or health? No    PCP : Dorothyann Peng, MD  Allergies:   Allergies  Allergen Reactions   Tradjenta [Linagliptin] Swelling    Medications: Outpatient Encounter Medications as of 02/03/2020  Medication Sig Note   Alcohol Swabs (ALCOHOL PREP) 70 % PADS  03/10/2013: Received from: External Pharmacy   aspirin 81 MG chewable tablet Chew 81 mg by mouth at bedtime.    Calcium Carbonate-Vitamin D (CALTRATE 600+D PO) Take 1 tablet by mouth daily.    carvedilol (COREG) 6.25 MG tablet TAKE 1 TABLET BY MOUTH TWICE DAILY    diclofenac sodium (VOLTAREN) 1 % GEL APPLY TO AFFECTED AREA 3 TIMES DAILY AS NEEDED (Patient taking differently: Apply 2 g topically 3 (three) times daily as needed (pain). )    furosemide (LASIX) 40 MG tablet TAKE 1 TABLET(40 MG) BY MOUTH DAILY    glucose blood test strip Test up to 4 times daily    Insulin Pen Needle 32G X 4 MM MISC Use with insulin Dx code e11.65    polyethylene glycol powder (GLYCOLAX/MIRALAX) 17 GM/SCOOP powder polyethylene glycol 3350 17 gram/dose oral powder  MIX 1 CAPFUL IN DRINK AND TK PO 1 TO 3 TIMES DAILY PRF DAILY SOFT STOOLS    potassium chloride SA (KLOR-CON) 20 MEQ tablet Take 1 tablet (20 mEq total) by mouth once for 1 dose. Please d/c 2 rx for 2 times per day thanks    rosuvastatin (CRESTOR) 10 MG tablet TAKE 1 TABLET BY MOUTH MONDAY-FRIDAY. DO NOT TAKE ON SATURDAY OR SUNDAY    rosuvastatin (CRESTOR) 20 MG tablet TAKE 1 TABLET BY MOUTH EVERY DAY    telmisartan (MICARDIS) 80 MG tablet TAKE 1 TABLET(80 MG) BY MOUTH DAILY    TRADJENTA 5 MG TABS tablet TK 1 T PO QD    TRESIBA FLEXTOUCH 200 UNIT/ML FlexTouch Pen INJECT 36 UNITS UNDER THE  SKIN DAILY. MAX TITRATION 80 UNITS    UNABLE TO FIND lancets 30 gauge    UNABLE TO FIND Comfort EZ Pen Needles 32 gauge x 5/32"    No facility-administered encounter medications on file as of 02/03/2020.    Current Diagnosis: Patient Active Problem List   Diagnosis Date Noted   Controlled type 2 diabetes mellitus with stable proliferative retinopathy of both eyes, with long-term current use of insulin (HCC) 07/04/2019   Intermediate stage nonexudative age-related macular degeneration of both eyes 07/04/2019   Posterior vitreous detachment of right eye 07/04/2019   Degenerative retinal drusen of both eyes 07/04/2019   Chorioretinal scar 07/04/2019   Pincer nail deformity 04/12/2019   AKI (acute kidney injury) (HCC) 06/11/2018   Sepsis due to gram-negative UTI (HCC) 06/11/2018   Acute lower UTI (urinary tract infection) 06/10/2018   HTN (hypertension) 06/10/2018   Joint swelling 06/10/2018   Hyperkalemia 06/10/2018   Hyponatremia 06/10/2018   Normocytic anemia 06/10/2018   Type 2 diabetes mellitus with stage 3 chronic kidney disease, with long-term current use of insulin (HCC) 03/08/2018   Parenchymal renal hypertension 03/08/2018   Pure hypercholesterolemia 03/08/2018   Class 3 severe obesity due to excess calories with serious comorbidity and body mass index (BMI) of 40.0 to 44.9 in adult [facility]) 03/08/2018  Reviewed chart for medication changes ahead of medication coordination call.  OV- 01/18/2020- Angel Little (CCM) since last care coordination call/Pharmacist visit.  No medication changes indicated.  BP Readings from Last 3 Encounters:  12/14/18 140/72  07/12/18 120/84  07/02/18 (!) 130/58    Lab Results  Component Value Date   HGBA1C 9.7 (H) 12/21/2018     Patient obtains medications through Adherence Packaging  30 Days   Last adherence delivery included: Carvedilol 6.25 mg- 1 tablet twice daily (before breakfast and evening meal)  Telmisartan 80  mg - 1 tablet daily (before breakfast)  Rosuvastatin 20 mg- 1 tablet daily (bedtime) Potassium 20 meq- 1 tablet daily (breakfast) Furosemide 40 mg- 1 tablet daily (before breakfast)  Patient did not decline any medications last month.   Patient is due for next adherence delivery on: 02/09/2020 . Called patient and reviewed medications and coordinated delivery.  This delivery to include: Carvedilol 6.25 mg- 1 tablet twice daily (before breakfast and evening meal)  Telmisartan 80 mg - 1 tablet daily (before breakfast)  Rosuvastatin 20 mg- 1 tablet daily (bedtime) Potassium 20 meq- 1 tablet daily (breakfast) Furosemide 40 mg- 1 tablet daily (before breakfast)  No short fill or acute fill needed.   Patient declined the following medications: Pen Needle 32G due to receiving a 90 day supply on 12/08/2019 and Tresiba FlexTouch 200 u/ml- 36 units under skin daily due to having 8 boxes (3 pens in each) on hand.   Patient needs refills for Telmisartan 80 mg, Furosemide 40 mg, Rosuvastatin 20 mg.  Confirmed delivery date of 02/09/2020, advised patient that pharmacy will contact them the morning of delivery.  Follow-Up:  Occupational hygienist and Pharmacist Review  [facility], she sits with patient everyday until 11 am. Would like delivery to be there before 10 am. Cherylin Mylar, CPP notified.  [name], CMA Clinical Pharmacist Assistant [phone]

## 2020-02-04 ENCOUNTER — Other Ambulatory Visit: Payer: Self-pay

## 2020-02-04 MED ORDER — ROSUVASTATIN CALCIUM 20 MG PO TABS: 20.0000 mg | ORAL_TABLET | Freq: Every day | ORAL | 0 refills | Status: DC

## 2020-02-04 MED ORDER — FUROSEMIDE 40 MG PO TABS
ORAL_TABLET | ORAL | 1 refills | Status: DC
Start: 2020-02-04 — End: 2020-02-06

## 2020-02-04 MED ORDER — TELMISARTAN 80 MG PO TABS: ORAL_TABLET | ORAL | 1 refills | Status: DC

## 2020-02-06 ENCOUNTER — Other Ambulatory Visit: Payer: Self-pay

## 2020-02-06 MED ORDER — TELMISARTAN 80 MG PO TABS: ORAL_TABLET | ORAL | 1 refills | Status: DC

## 2020-02-06 MED ORDER — FUROSEMIDE 40 MG PO TABS: ORAL_TABLET | ORAL | 1 refills | Status: DC

## 2020-02-06 MED ORDER — ROSUVASTATIN CALCIUM 20 MG PO TABS
20.0000 mg | ORAL_TABLET | Freq: Every day | ORAL | 1 refills | Status: DC
Start: 2020-02-06 — End: 2020-05-22

## 2020-02-09 ENCOUNTER — Encounter: Payer: Medicare PPO | Admitting: Nurse Practitioner

## 2020-02-09 ENCOUNTER — Telehealth: Payer: Self-pay

## 2020-02-09 NOTE — Telephone Encounter (Signed)
The pt called and left a message that she couldn't make it to her appt today.  The pt was told that it's tomorrow at 9. The pt said she would ask her granddaughter Helen Richardson if she could bring her.  I called and left ashley a message to see if she could bring the pt tomorrow and that her grandmother wanted her to give her a call also.

## 2020-02-10 ENCOUNTER — Encounter: Payer: Medicare PPO | Admitting: Nurse Practitioner

## 2020-02-17 DIAGNOSIS — E119 Type 2 diabetes mellitus without complications: Secondary | ICD-10-CM | POA: Diagnosis not present

## 2020-02-17 DIAGNOSIS — Z794 Long term (current) use of insulin: Secondary | ICD-10-CM | POA: Diagnosis not present

## 2020-03-05 ENCOUNTER — Telehealth: Payer: Self-pay

## 2020-03-05 NOTE — Chronic Care Management (AMB) (Signed)
Chronic Care Management Pharmacy Assistant   Name: Helen Richardson  MRN: [id number] DOB: 08-May-1933  Reason for Encounter: Medication Review  Patient Questions:  1.  Have you seen any other providers since your last visit? No  2.  Any changes in your medicines or health? No   PCP : Dorothyann Peng, MD  Allergies:   Allergies  Allergen Reactions   Tradjenta [Linagliptin] Swelling    Medications: Outpatient Encounter Medications as of 03/05/2020  Medication Sig Note   Alcohol Swabs (ALCOHOL PREP) 70 % PADS  03/10/2013: Received from: External Pharmacy   aspirin 81 MG chewable tablet Chew 81 mg by mouth at bedtime.    Calcium Carbonate-Vitamin D (CALTRATE 600+D PO) Take 1 tablet by mouth daily.    carvedilol (COREG) 6.25 MG tablet TAKE 1 TABLET BY MOUTH TWICE DAILY    diclofenac sodium (VOLTAREN) 1 % GEL APPLY TO AFFECTED AREA 3 TIMES DAILY AS NEEDED (Patient taking differently: Apply 2 g topically 3 (three) times daily as needed (pain). )    furosemide (LASIX) 40 MG tablet TAKE 1 TABLET(40 MG) BY MOUTH DAILY    glucose blood test strip Test up to 4 times daily    Insulin Pen Needle 32G X 4 MM MISC Use with insulin Dx code e11.65    polyethylene glycol powder (GLYCOLAX/MIRALAX) 17 GM/SCOOP powder polyethylene glycol 3350 17 gram/dose oral powder  MIX 1 CAPFUL IN DRINK AND TK PO 1 TO 3 TIMES DAILY PRF DAILY SOFT STOOLS    potassium chloride SA (KLOR-CON) 20 MEQ tablet Take 1 tablet (20 mEq total) by mouth once for 1 dose. Please d/c 2 rx for 2 times per day thanks    rosuvastatin (CRESTOR) 10 MG tablet TAKE 1 TABLET BY MOUTH MONDAY-FRIDAY. DO NOT TAKE ON SATURDAY OR SUNDAY    rosuvastatin (CRESTOR) 20 MG tablet Take 1 tablet (20 mg total) by mouth daily.    telmisartan (MICARDIS) 80 MG tablet TAKE 1 TABLET(80 MG) BY MOUTH DAILY    TRADJENTA 5 MG TABS tablet TK 1 T PO QD    TRESIBA FLEXTOUCH 200 UNIT/ML FlexTouch Pen INJECT 36 UNITS UNDER THE SKIN DAILY. MAX TITRATION  80 UNITS    UNABLE TO FIND lancets 30 gauge    UNABLE TO FIND Comfort EZ Pen Needles 32 gauge x 5/32"    No facility-administered encounter medications on file as of 03/05/2020.    Current Diagnosis: Patient Active Problem List   Diagnosis Date Noted   Controlled type 2 diabetes mellitus with stable proliferative retinopathy of both eyes, with long-term current use of insulin (HCC) 07/04/2019   Intermediate stage nonexudative age-related macular degeneration of both eyes 07/04/2019   Posterior vitreous detachment of right eye 07/04/2019   Degenerative retinal drusen of both eyes 07/04/2019   Chorioretinal scar 07/04/2019   Pincer nail deformity 04/12/2019   AKI (acute kidney injury) (HCC) 06/11/2018   Sepsis due to gram-negative UTI (HCC) 06/11/2018   Acute lower UTI (urinary tract infection) 06/10/2018   HTN (hypertension) 06/10/2018   Joint swelling 06/10/2018   Hyperkalemia 06/10/2018   Hyponatremia 06/10/2018   Normocytic anemia 06/10/2018   Type 2 diabetes mellitus with stage 3 chronic kidney disease, with long-term current use of insulin (HCC) 03/08/2018   Parenchymal renal hypertension 03/08/2018   Pure hypercholesterolemia 03/08/2018   Class 3 severe obesity due to excess calories with serious comorbidity and body mass index (BMI) of 40.0 to 44.9 in adult [facility]) 03/08/2018   Reviewed chart for  medication changes ahead of medication coordination call.  No OVs, Consults, or hospital visits since last care coordination call/Pharmacist visit. (If appropriate, list visit date, provider name)  No medication changes indicated OR if recent visit, treatment plan here.  BP Readings from Last 3 Encounters:  12/14/18 140/72  07/12/18 120/84  07/02/18 (!) 130/58    Lab Results  Component Value Date   HGBA1C 9.7 (H) 12/21/2018     Patient obtains medications through Adherence Packaging  30 Days   Last adherence delivery included:  Carvedilol 6.25 mg- 1  tablet twice daily (before breakfast and evening meal)  Telmisartan80 mg- 1 tablet daily (before breakfast) Rosuvastatin 20 mg- 1 tablet daily (bedtime) Potassium 20 meq- 1 tablet daily (breakfast) Furosemide 40 mg- 1 tablet daily (before breakfast)  Patient declined the following medications last month: Pen Needle 32 G due to receiving a 90 day supply on 12/08/2019 and Tresiba FlexTouch 200 u/ml- 36 units under skin daily due to having 8 boxes (3 pens in each) on hand.    Patient is due for next adherence delivery on: 03/09/2020 . Called patient and reviewed medications and coordinated delivery.  This delivery to include: Carvedilol 6.25 mg- 1 tablet twice daily (before breakfast and evening meal)  Telmisartan80 mg- 1 tablet daily (before breakfast) Rosuvastatin 20 mg- 1 tablet daily (bedtime) Potassium 20 meq- 1 tablet daily (breakfast) Furosemide 40 mg- 1 tablet daily (before breakfast)  No short fill or acute fill needed.  Patient declined the following medications: Pen Needle 32G due to receiving a 90 day supply on 12/08/2019 patient has 1 brand new box left. Evaristo Bury FlexTouch 200 u/ml- 36 units under skin daily due to having 7 boxes (3 pens in each) on hand.   No refills needed.  Confirmed delivery date of 03/09/2020, advised patient that pharmacy will contact them the morning of delivery.  Follow-Up:  Occupational hygienist and Pharmacist Review Spoke with [facility], she states patients blood pressures and blood sugars have been doing well. Patient had 1 day of elevated blood sugar on 12/13 was 258 per patient she had some apple pie. Other fasting readings: 150, 119, 108, 99. Recent blood pressure readings: 106/54, 119/62, 121/63. Patient's aid requested a call from PCP office to schedule a follow up visit, would like afternoon appointments, would like granddaughter Morrie Sheldon to be contact.  Cherylin Mylar, CPP notified.  [name],  CMA Clinical Pharmacist Assistant [phone]

## 2020-03-07 ENCOUNTER — Encounter: Payer: Medicare PPO | Admitting: Nurse Practitioner

## 2020-03-08 ENCOUNTER — Telehealth: Payer: Self-pay

## 2020-03-08 ENCOUNTER — Telehealth: Payer: Medicare PPO

## 2020-03-08 IMAGING — CT CT ABDOMEN AND PELVIS WITH CONTRAST
3 of 5 series · 16 of 46 positions shown, 18 images · IV contrast (omnipaque)
Comparison: None.

CLINICAL DATA: Lower quadrant abdominal pain, right greater than
left since last evening.

EXAM:
CT ABDOMEN AND PELVIS WITH CONTRAST
TECHNIQUE: Multidetector CT imaging of the abdomen and pelvis was performed
using the standard protocol following bolus administration of
intravenous contrast.
CONTRAST:  100mL OMNIPAQUE IOHEXOL 300 MG/ML  SOLN

[Series 3: abdomen 5.0 · axial · 0.86mm/px · z∈[+815,+1175]mm · 11 of 88 slices shown, 13 images]
[im 8/88  soft-tissue]
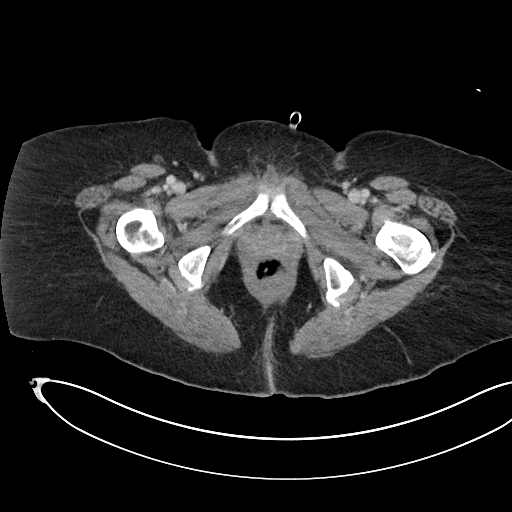
[im 8/88  bone]
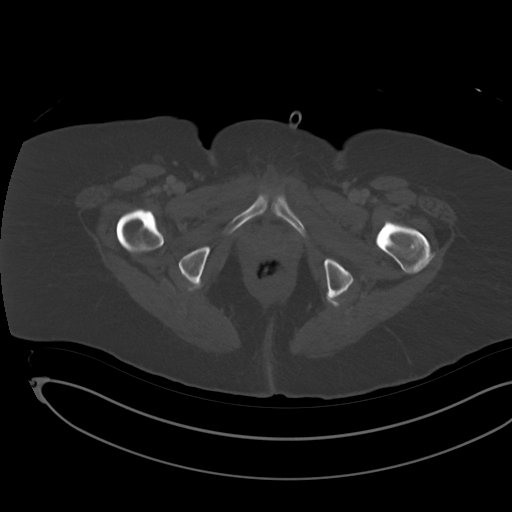
[im 15/88  soft-tissue]
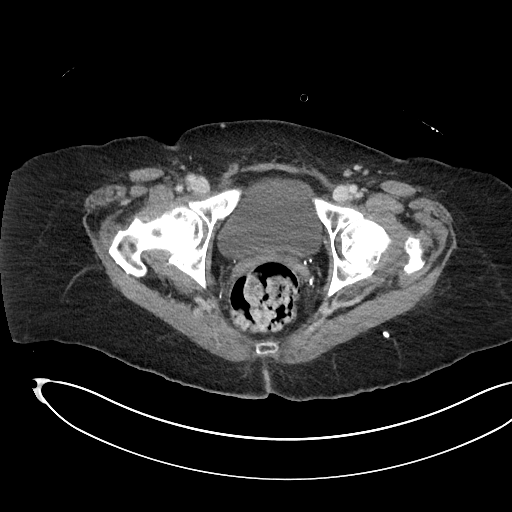
[im 22/88  soft-tissue]
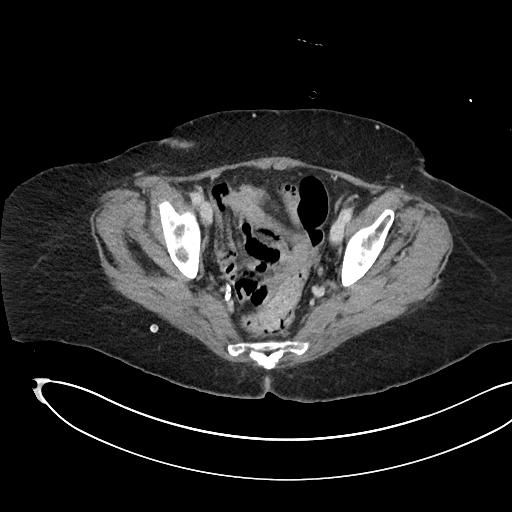
[im 30/88  soft-tissue]
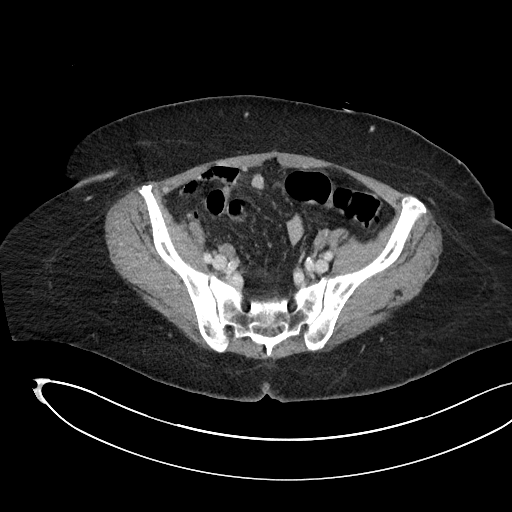
[im 37/88  soft-tissue]
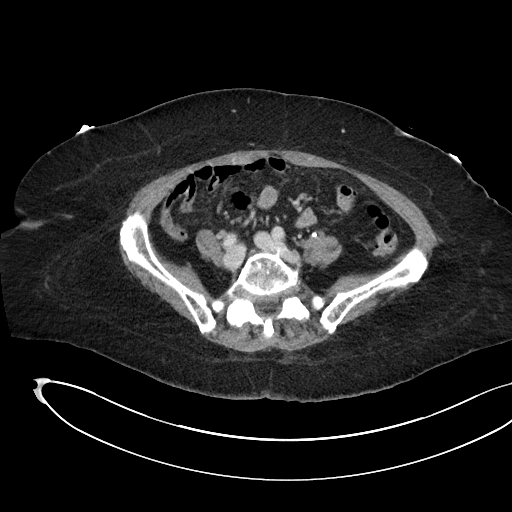
[im 44/88  soft-tissue]
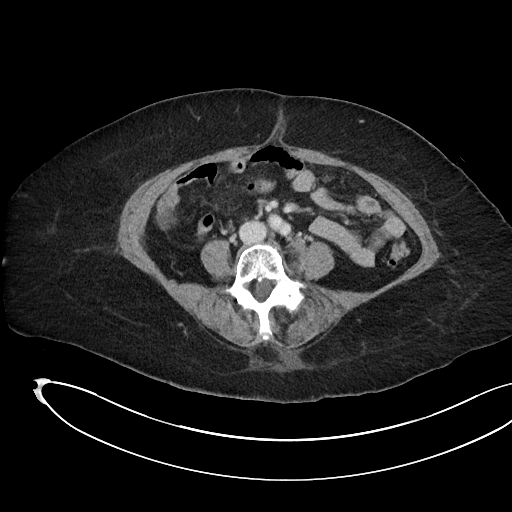
[im 51/88  soft-tissue]
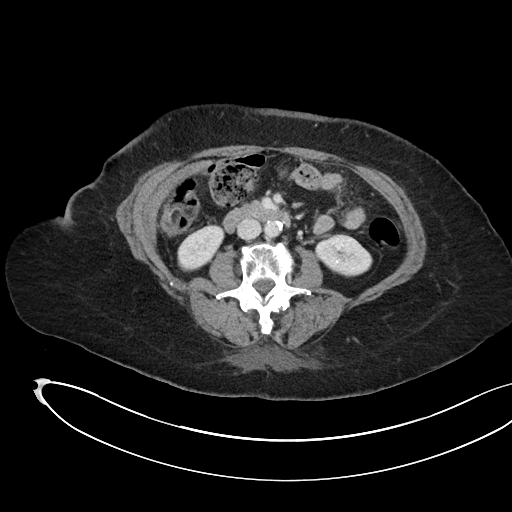
[im 59/88  soft-tissue]
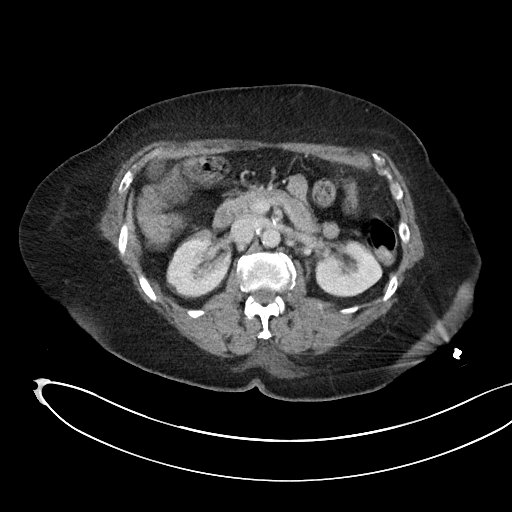
[im 66/88  soft-tissue]
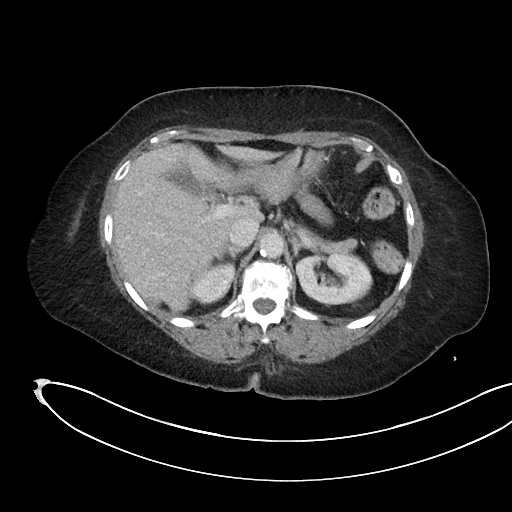
[im 66/88  bone]
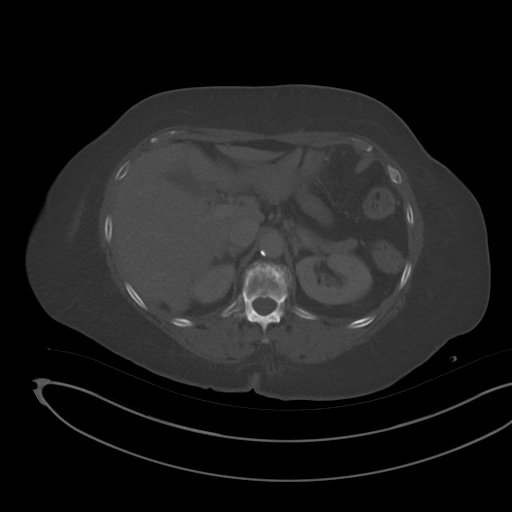
[im 73/88  soft-tissue]
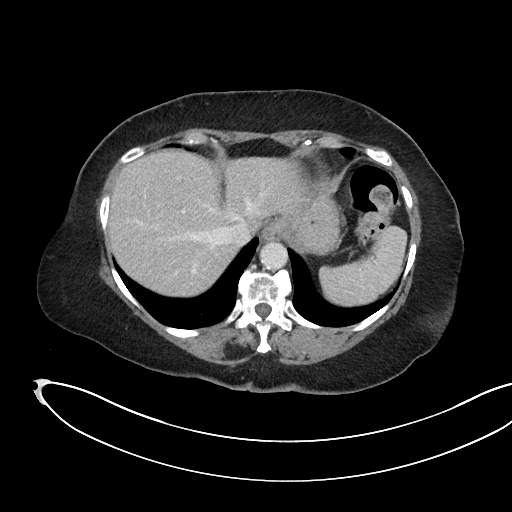
[im 80/88  soft-tissue]
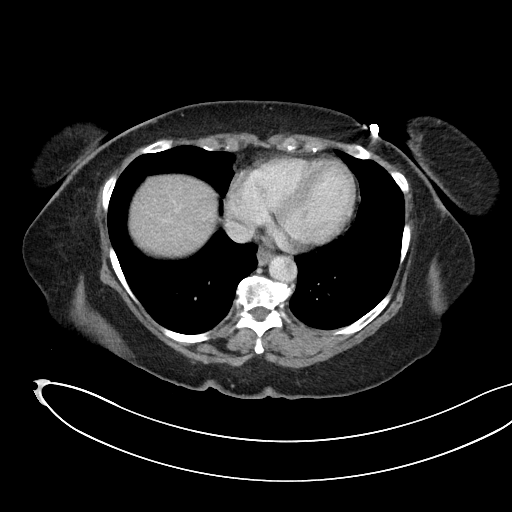

[Series 5: lung · axial · 0.86mm/px · z∈[+1043,+1071]mm · 2 of 94 slices shown]
[im 8/94  bone]
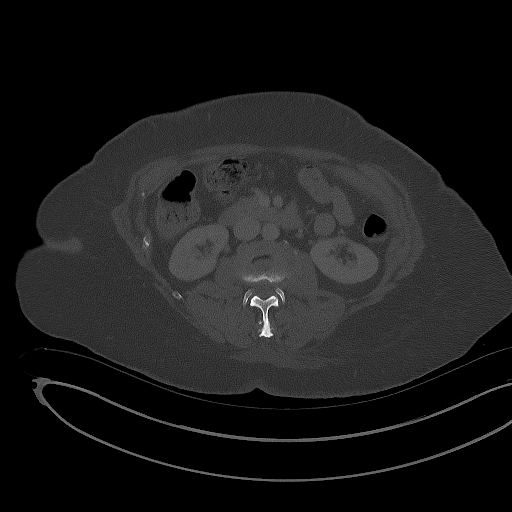
[im 22/94  bone]
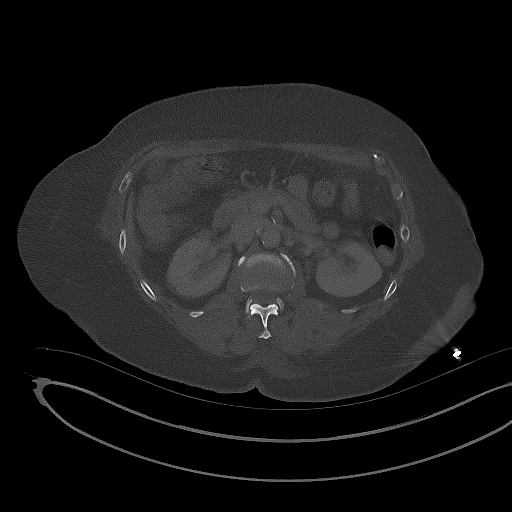

[Series 6: abdomen 3.0 mpr cor · coronal · 0.67mm/px · 3 of 85 slices shown]
[im 29/85  soft-tissue]
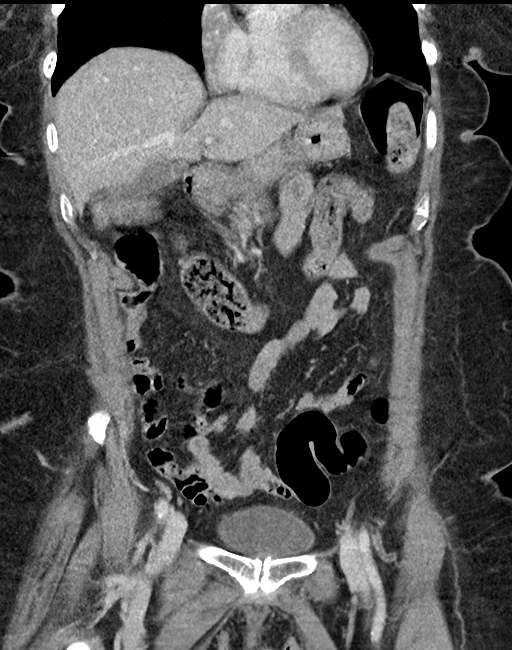
[im 38/85  soft-tissue]
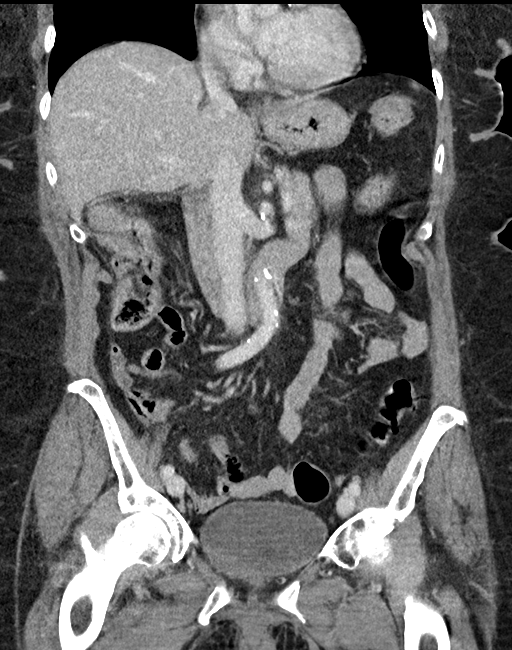
[im 47/85  soft-tissue]
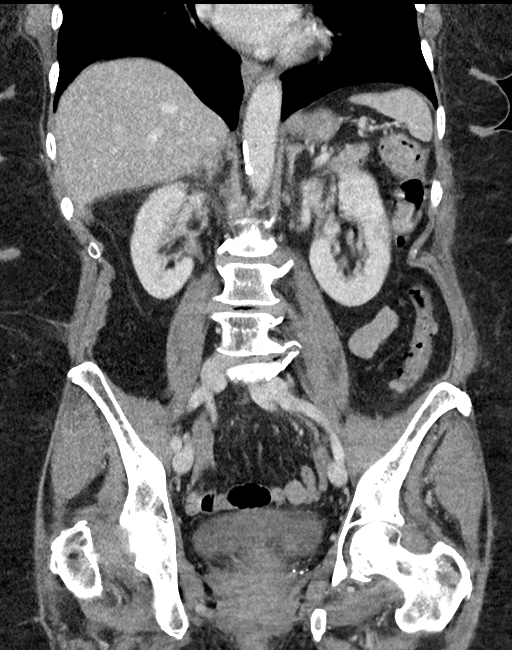

[16 of 46 positions shown; findings below may reference images not displayed]

FINDINGS: Lower chest: The included heart size is normal.  Clear lung bases.

Hepatobiliary: Limited by motion artifacts. No gallstones or biliary
dilatation. No enhancing liver lesion. Subtle hypodensity in the
posterior right hepatic lobe too small to further characterize
measuring 6 mm is identified statistically consistent with a cyst,
biliary hamartoma or tiny hemangioma.

Pancreas: No mass or definite inflammation though limited by motion
artifacts.

Spleen: No splenomegaly or mass.

Adrenals/Urinary Tract: Adrenal glands are unremarkable. There
appears to be a nonobstructing lower pole left renal calculus
measuring up to 4 mm. No hydroureteronephrosis. No enhancing renal
mass. Bladder is unremarkable.

Stomach/Bowel: Stomach is within normal limits. Appendix appears
normal. No evidence of bowel wall thickening, distention, or
inflammatory changes. Moderate stool retention within the
rectosigmoid.

Vascular/Lymphatic: Aortic atherosclerosis. No enlarged abdominal or
pelvic lymph nodes.

Reproductive: Status post hysterectomy. No adnexal masses.

Other: Small periumbilical fat containing hernia. No free air or
free fluid.

Musculoskeletal: Degenerative disc disease T12 through L5.
IMPRESSION: 1. Moderate stool retention within the rectosigmoid query
constipation.
2. No acute bowel obstruction or inflammation.
3. 4 mm calcification is suggested in the left kidney without
obstruction.
4. Tiny too small to characterize cyst or hemangioma in the right
hepatic lobe.
5. Thoracolumbar spondylosis.

## 2020-03-08 NOTE — Telephone Encounter (Cosign Needed)
  Chronic Care Management   Outreach Note  03/08/2020 Name: Helen Richardson MRN: [id number] DOB: [birth year]  Referred by: Dorothyann Peng, MD Reason for referral : Chronic Care Management (RN CM FU Call )   An unsuccessful telephone outreach was attempted today. The patient was referred to the case management team for assistance with care management and care coordination.   Follow Up Plan: Telephone follow up appointment with care management team member scheduled for: 04/10/20  Delsa Sale, RN, BSN, CCM Care Management Coordinator [facility] Care Management/[facility]  Direct Phone: [phone]

## 2020-03-14 ENCOUNTER — Ambulatory Visit: Payer: Medicare PPO | Admitting: Nurse Practitioner

## 2020-03-14 ENCOUNTER — Encounter: Payer: Self-pay | Admitting: Nurse Practitioner

## 2020-03-14 ENCOUNTER — Other Ambulatory Visit: Payer: Self-pay

## 2020-03-14 VITALS — BP 128/68 | HR 86 | Temp 98.6°F | Ht 63.0 in | Wt 202.4 lb

## 2020-03-14 DIAGNOSIS — N183 Chronic kidney disease, stage 3 unspecified: Secondary | ICD-10-CM

## 2020-03-14 DIAGNOSIS — R9431 Abnormal electrocardiogram [ECG] [EKG]: Secondary | ICD-10-CM

## 2020-03-14 DIAGNOSIS — I509 Heart failure, unspecified: Secondary | ICD-10-CM | POA: Diagnosis not present

## 2020-03-14 DIAGNOSIS — R413 Other amnesia: Secondary | ICD-10-CM | POA: Diagnosis not present

## 2020-03-14 DIAGNOSIS — Z0000 Encounter for general adult medical examination without abnormal findings: Secondary | ICD-10-CM

## 2020-03-14 DIAGNOSIS — I129 Hypertensive chronic kidney disease with stage 1 through stage 4 chronic kidney disease, or unspecified chronic kidney disease: Secondary | ICD-10-CM

## 2020-03-14 DIAGNOSIS — E1122 Type 2 diabetes mellitus with diabetic chronic kidney disease: Secondary | ICD-10-CM

## 2020-03-14 DIAGNOSIS — H6123 Impacted cerumen, bilateral: Secondary | ICD-10-CM | POA: Diagnosis not present

## 2020-03-14 DIAGNOSIS — Z794 Long term (current) use of insulin: Secondary | ICD-10-CM | POA: Diagnosis not present

## 2020-03-14 DIAGNOSIS — E7800 Pure hypercholesterolemia, unspecified: Secondary | ICD-10-CM

## 2020-03-14 DIAGNOSIS — Z23 Encounter for immunization: Secondary | ICD-10-CM

## 2020-03-14 DIAGNOSIS — B3789 Other sites of candidiasis: Secondary | ICD-10-CM | POA: Diagnosis not present

## 2020-03-14 DIAGNOSIS — I10 Essential (primary) hypertension: Secondary | ICD-10-CM

## 2020-03-14 DIAGNOSIS — Z6835 Body mass index (BMI) 35.0-35.9, adult: Secondary | ICD-10-CM

## 2020-03-14 DIAGNOSIS — Z0001 Encounter for general adult medical examination with abnormal findings: Secondary | ICD-10-CM | POA: Diagnosis not present

## 2020-03-14 MED ORDER — NYSTATIN 100000 UNIT/GM EX POWD: 1.0000 "application " | Freq: Three times a day (TID) | CUTANEOUS | 0 refills | Status: DC

## 2020-03-14 NOTE — Patient Instructions (Signed)
Health Maintenance After Age 85 After age 85, you are at a higher risk for certain long-term diseases and infections as well as injuries from falls. Falls are a major cause of broken bones and head injuries in people who are older than age 85. Getting regular preventive care can help to keep you healthy and well. Preventive care includes getting regular testing and making lifestyle changes as recommended by your health care provider. Talk with your health care provider about:  Which screenings and tests you should have. A screening is a test that checks for a disease when you have no symptoms.  A diet and exercise plan that is right for you. What should I know about screenings and tests to prevent falls? Screening and testing are the best ways to find a health problem early. Early diagnosis and treatment give you the best chance of managing medical conditions that are common after age 85. Certain conditions and lifestyle choices may make you more likely to have a fall. Your health care provider may recommend:  Regular vision checks. Poor vision and conditions such as cataracts can make you more likely to have a fall. If you wear glasses, make sure to get your prescription updated if your vision changes.  Medicine review. Work with your health care provider to regularly review all of the medicines you are taking, including over-the-counter medicines. Ask your health care provider about any side effects that may make you more likely to have a fall. Tell your health care provider if any medicines that you take make you feel dizzy or sleepy.  Osteoporosis screening. Osteoporosis is a condition that causes the bones to get weaker. This can make the bones weak and cause them to break more easily.  Blood pressure screening. Blood pressure changes and medicines to control blood pressure can make you feel dizzy.  Strength and balance checks. Your health care provider may recommend certain tests to check your  strength and balance while standing, walking, or changing positions.  Foot health exam. Foot pain and numbness, as well as not wearing proper footwear, can make you more likely to have a fall.  Depression screening. You may be more likely to have a fall if you have a fear of falling, feel emotionally low, or feel unable to do activities that you used to do.  Alcohol use screening. Using too much alcohol can affect your balance and may make you more likely to have a fall. What actions can I take to lower my risk of falls? General instructions  Talk with your health care provider about your risks for falling. Tell your health care provider if: ? You fall. Be sure to tell your health care provider about all falls, even ones that seem minor. ? You feel dizzy, sleepy, or off-balance.  Take over-the-counter and prescription medicines only as told by your health care provider. These include any supplements.  Eat a healthy diet and maintain a healthy weight. A healthy diet includes low-fat dairy products, low-fat (lean) meats, and fiber from whole grains, beans, and lots of fruits and vegetables. Home safety  Remove any tripping hazards, such as rugs, cords, and clutter.  Install safety equipment such as grab bars in bathrooms and safety rails on stairs.  Keep rooms and walkways well-lit. Activity  Follow a regular exercise program to stay fit. This will help you maintain your balance. Ask your health care provider what types of exercise are appropriate for you.  If you need a cane or walker,   use it as recommended by your health care provider.  Wear supportive shoes that have nonskid soles.   Lifestyle  Do not drink alcohol if your health care provider tells you not to drink.  If you drink alcohol, limit how much you have: ? 0-1 drink a day for women. ? 0-2 drinks a day for men.  Be aware of how much alcohol is in your drink. In the [place] one drink equals one typical bottle of beer (12  oz), one-half glass of wine (5 oz), or one shot of hard liquor (1 oz).  Do not use any products that contain nicotine or tobacco, such as cigarettes and e-cigarettes. If you need help quitting, ask your health care provider. Summary  Having a healthy lifestyle and getting preventive care can help to protect your health and wellness after age 85.  Screening and testing are the best way to find a health problem early and help you avoid having a fall. Early diagnosis and treatment give you the best chance for managing medical conditions that are more common for people who are older than age 85.  Falls are a major cause of broken bones and head injuries in people who are older than age 85. Take precautions to prevent a fall at home.  Work with your health care provider to learn what changes you can make to improve your health and wellness and to prevent falls. This information is not intended to replace advice given to you by your health care provider. Make sure you discuss any questions you have with your health care provider. Document Revised: 06/10/2018 Document Reviewed: 12/31/2016 Elsevier Patient Education  2021 Elsevier Inc.  

## 2020-03-14 NOTE — Progress Notes (Signed)
I,Tianna Badgett,acting as a Education administrator for [employer], NP.,have documented all relevant documentation on the behalf of [employer], NP,as directed by  Bary Castilla, NP while in the presence of Bary Castilla, NP.  This visit occurred during the SARS-CoV-2 public health emergency.  Safety protocols were in place, including screening questions prior to the visit, additional usage of staff PPE, and extensive cleaning of exam room while observing appropriate contact time as indicated for disinfecting solutions.  Subjective:     Patient ID: Helen Richardson , female    DOB: 03/25/1933 , 87 y.o.   MRN: [id number]   Chief Complaint  Patient presents with  . Annual Exam    HPI  Patient is here for full physical exam. She states that she has been compliant with her medications. And has not had any issues with it. She would like to discuss issues with her memory.  Last time she was physically here for a check up was in 2020.  She had no other concerns today.  She refused to give [place] a urine sample.   She last saw a foot doctor in August 2022 Eye doctor: She will make an appointment soon with them.  Diet: Eggs, grits, toast, fruits, she does eat a lot of chips and potatoes, Fast food (chicken subs, [employer]), vegetables, she eats sweats such as cake at times.   Exercise:  She doesn't get up and walk too much. Sedentary lifestyle. But she does do some leg exercises while sitting down.    Diabetes She presents for her follow-up diabetic visit. She has type 2 diabetes mellitus. Pertinent negatives for diabetes include no blurred vision, no chest pain, no polydipsia, no polyphagia and no polyuria. Symptoms are stable. Diabetic complications include heart disease and nephropathy. Risk factors for coronary artery disease include diabetes mellitus, dyslipidemia and hypertension. Current diabetic treatment includes insulin injections. She is compliant with treatment most of the time. She is  following a generally unhealthy diet. When asked about meal planning, she reported none. She has not had a previous visit with a dietitian. She rarely participates in exercise. (It mostly runs in the 100s according to the patient. )  Hypertension This is a chronic problem. The current episode started more than 1 year ago. The problem is controlled. Pertinent negatives include no blurred vision or chest pain. Risk factors for coronary artery disease include diabetes mellitus, dyslipidemia, obesity and sedentary lifestyle. Past treatments include diuretics and beta blockers. Compliance problems include diet and exercise.  Hypertensive end-organ damage includes kidney disease.     Past Medical History:  Diagnosis Date  . CHF (congestive heart failure) ([place])   . Diabetes mellitus without complication ([place])   . High cholesterol   . HTN (hypertension)   . Obesity      Family History  Problem Relation Age of Onset  . Diabetes Mother   . Hypertension Mother   . Hypertension Father   . Stroke Father      Current Outpatient Medications:  .  nystatin (NYSTATIN) powder, Apply 1 application topically 3 (three) times daily., Disp: 15 g, Rfl: 0 .  Alcohol Swabs (ALCOHOL PREP) 70 % PADS, , Disp: , Rfl:  .  aspirin 81 MG chewable tablet, Chew 81 mg by mouth at bedtime., Disp: , Rfl:  .  Calcium Carbonate-Vitamin D (CALTRATE 600+D PO), Take 1 tablet by mouth daily., Disp: , Rfl:  .  carvedilol (COREG) 6.25 MG tablet, TAKE 1 TABLET BY MOUTH TWICE DAILY, Disp: 180 tablet,  Rfl: 1 .  diclofenac sodium (VOLTAREN) 1 % GEL, APPLY TO AFFECTED AREA 3 TIMES DAILY AS NEEDED (Patient taking differently: Apply 2 g topically 3 (three) times daily as needed (pain). ), Disp: 100 g, Rfl: 1 .  furosemide (LASIX) 40 MG tablet, TAKE 1 TABLET(40 MG) BY MOUTH DAILY, Disp: 90 tablet, Rfl: 1 .  glucose blood test strip, Test up to 4 times daily, Disp: 200 each, Rfl: 12 .  Insulin Pen Needle 32G X 4 MM MISC, Use with insulin  Dx code e11.65, Disp: 100 each, Rfl: 3 .  polyethylene glycol powder (GLYCOLAX/MIRALAX) 17 GM/SCOOP powder, polyethylene glycol 3350 17 gram/dose oral powder  MIX 1 CAPFUL IN DRINK AND TK PO 1 TO 3 TIMES DAILY PRF DAILY SOFT STOOLS, Disp: , Rfl:  .  potassium chloride SA (KLOR-CON) 20 MEQ tablet, Take 1 tablet (20 mEq total) by mouth once for 1 dose. Please d/c 2 rx for 2 times per day thanks, Disp: 30 tablet, Rfl: 2 .  rosuvastatin (CRESTOR) 10 MG tablet, TAKE 1 TABLET BY MOUTH MONDAY-FRIDAY. DO NOT TAKE ON SATURDAY OR SUNDAY, Disp: 90 tablet, Rfl: 1 .  rosuvastatin (CRESTOR) 20 MG tablet, Take 1 tablet (20 mg total) by mouth daily., Disp: 90 tablet, Rfl: 1 .  telmisartan (MICARDIS) 80 MG tablet, TAKE 1 TABLET(80 MG) BY MOUTH DAILY, Disp: 90 tablet, Rfl: 1 .  TRADJENTA 5 MG TABS tablet, TK 1 T PO QD, Disp: , Rfl:  .  TRESIBA FLEXTOUCH 200 UNIT/ML FlexTouch Pen, INJECT 36 UNITS UNDER THE SKIN DAILY. MAX TITRATION 80 UNITS, Disp: 45 mL, Rfl: 1 .  UNABLE TO FIND, lancets 30 gauge, Disp: , Rfl:  .  UNABLE TO FIND, Comfort EZ Pen Needles 32 gauge x 5/32", Disp: , Rfl:    Allergies  Allergen Reactions  . Tradjenta [Linagliptin] Swelling      The patient states she uses nothing for birth control. Negative for: breast discharge, breast lump(s), breast pain and breast self exam. Pertinent negatives include abnormal bleeding (hematology), anxiety, decreased libido, depression, difficulty falling sleep, dyspareunia, history of infertility, nocturia, sexual dysfunction, sleep disturbances, urinary incontinence, urinary urgency, vaginal discharge and vaginal itching. Diet regular.The patient states her exercise level is minimum.    . The patient's tobacco use is:  Social History   Tobacco Use  Smoking Status Never Smoker  Smokeless Tobacco Never Used  . She has been exposed to passive smoke. The patient's alcohol use is:  Social History   Substance and Sexual Activity  Alcohol Use No  . Additional  information   Review of Systems  Constitutional: Negative.   HENT: Negative.   Eyes: Negative.  Negative for blurred vision.  Respiratory: Negative.   Cardiovascular: Negative.  Negative for chest pain.  Gastrointestinal: Negative.   Endocrine: Negative.  Negative for polydipsia, polyphagia and polyuria.  Genitourinary: Negative.   Musculoskeletal: Negative.   Skin: Positive for rash.       Yeast under left breast  Allergic/Immunologic: Negative.   Neurological: Negative.   Hematological: Negative.   Psychiatric/Behavioral: Negative.      Today's Vitals   03/14/20 1405  BP: 128/68  Pulse: 86  Temp: 98.6 F (37 C)  TempSrc: Oral  Weight: 202 lb 6.4 oz (91.8 kg)  Height: '5\' 3"'  (1.6 m)   Body mass index is 35.85 kg/m.  Wt Readings from Last 3 Encounters:  03/14/20 202 lb 6.4 oz (91.8 kg)  03/01/19 177 lb (80.3 kg)  12/14/18 182 lb 9.6 oz (  82.8 kg)    Objective:  Physical Exam Constitutional:      General: She is not in acute distress.    Appearance: Normal appearance. She is obese. She is not ill-appearing.  HENT:     Head: Normocephalic and atraumatic.     Right Ear: There is impacted cerumen.     Left Ear: There is impacted cerumen.     Ears:     Comments: Hard of hearing     Nose: Nose normal.     Mouth/Throat:     Comments: Deferred patient masked.  Eyes:     Extraocular Movements: Extraocular movements intact.     Pupils: Pupils are equal, round, and reactive to light.  Cardiovascular:     Rate and Rhythm: Normal rate and regular rhythm.     Pulses: Normal pulses.     Heart sounds: Normal heart sounds.  Pulmonary:     Effort: Pulmonary effort is normal. No respiratory distress.     Breath sounds: Normal breath sounds. No wheezing.  Abdominal:     General: Bowel sounds are normal.     Palpations: Abdomen is soft.  Genitourinary:    Comments: Deferred.  Musculoskeletal:        General: Normal range of motion.     Cervical back: Normal range of  motion and neck supple.  Skin:    General: Skin is warm and dry.     Capillary Refill: Capillary refill takes less than 2 seconds.  Neurological:     Mental Status: She is alert and oriented to person, place, and time.  Psychiatric:        Mood and Affect: Mood normal.        Behavior: Behavior normal.        Thought Content: Thought content normal.        Judgment: Judgment normal.     Assessment And Plan:     1. Encounter for annual physical exam -Discussed importance of annual exam with patient  -Went over previous labs with patient  -Educated patient about importance of immunization, screenings and multivitamins.  - CBC -Hemoglobin A1c -CMP14+EGFR  -Refused a POC urine   2. Type 2 diabetes mellitus with stage 3 chronic kidney disease, with long-term current use of insulin, unspecified whether stage 3a or 3b CKD (HCC) -Stable, Chronic  -Will check labs and adjust meds if needed  -Educated patient on having healthy lifestyle which includes increasing green vegetables, sugar free foods and drinks. Avoid high carb foods.  - CBC - Hemoglobin A1c - CMP14+EGFR  3. Essential hypertension -Chronic, stable.  -Limit the intake of processed foods and salt intake. You should increase your intake of green vegetables and fruits. Limit the use of alcohol. Limit fast foods and fried foods. Avoid high fatty saturated and trans fat foods. Keep yourself hydrated with drinking water. Avoid red meats. Eat lean meats instead. Exercise for atleast 30-45 min for atleast 4-5 times a week.  - CBC - Hemoglobin A1c - CMP14+EGFR  4. Abnormal EKG - Ambulatory referral to Cardiology  5. Congested Heart Failure, unspecified HF chronicity, unspecified heart failure type ([place])  -Chronic, stable  -Continue meds  -Follow up with cardiologist.  -Refer for cardiologist   6. Pure hypercholesterolemia -Chronic, stable  -Educated patient about maintaining a healthy lifestyle which includes eating  healthier and exercising. Eliminating red meats, increasing fish intake and fiber.  - Lipid panel  7. Candidiasis of breast - nystatin (NYSTATIN) powder; Apply 1 application topically 3 (  three) times daily.  Dispense: 15 g; Refill: 0 -Educated patient to keep area dry; Dry with towel after bathe.   8. Memory loss -Will check for metabolic causes  -MMSE in office score 4  - TSH + free T4 - Vitamin B12  9. Bilateral impacted cerumen - Ear Lavage, Curette performed in office.   10. Class 2 severe obesity due to excess calories with serious comorbidity and body mass index (BMI) of 35.0 to 35.9 in adult [facility]) She is encouraged to strive for BMI less than 30 to decrease cardiac risk. Advised to aim for at least 150 minutes of exercise per week. -Educated patient about having a healthy lifestyle with diet and to do as much chair exercise she can do.   11. Need for influenza vaccine  -Flu vaccine high dose given in office today.   Patient was given opportunity to ask questions. Patient verbalized understanding of the plan and was able to repeat key elements of the plan. All questions were answered to their satisfaction.   Bary Castilla, NP   I, Bary Castilla, NP, have reviewed all documentation for this visit. The documentation on 03/14/20 for the exam, diagnosis, procedures, and orders are all accurate and complete.  THE PATIENT IS ENCOURAGED TO PRACTICE SOCIAL DISTANCING DUE TO THE COVID-19 PANDEMIC.

## 2020-03-15 ENCOUNTER — Other Ambulatory Visit: Payer: Self-pay | Admitting: Nurse Practitioner

## 2020-03-15 DIAGNOSIS — Z794 Long term (current) use of insulin: Secondary | ICD-10-CM

## 2020-03-15 DIAGNOSIS — N183 Chronic kidney disease, stage 3 unspecified: Secondary | ICD-10-CM

## 2020-03-15 DIAGNOSIS — E1122 Type 2 diabetes mellitus with diabetic chronic kidney disease: Secondary | ICD-10-CM

## 2020-03-15 LAB — CMP14+EGFR
ALT: 8 IU/L (ref 0–32)
AST: 12 IU/L (ref 0–40)
Albumin/Globulin Ratio: 1.4 (ref 1.2–2.2)
Albumin: 3.8 g/dL (ref 3.6–4.6)
Alkaline Phosphatase: 99 IU/L (ref 44–121)
BUN/Creatinine Ratio: 15 (ref 12–28)
BUN: 15 mg/dL (ref 8–27)
Bilirubin Total: 0.3 mg/dL (ref 0.0–1.2)
CO2: 26 mmol/L (ref 20–29)
Calcium: 9.4 mg/dL (ref 8.7–10.3)
Chloride: 95 mmol/L — ABNORMAL LOW (ref 96–106)
Creatinine, Ser: 1.01 mg/dL — ABNORMAL HIGH (ref 0.57–1.00)
GFR calc Af Amer: 59 mL/min/{1.73_m2} — ABNORMAL LOW (ref >59)
GFR calc non Af Amer: 51 mL/min/{1.73_m2} — ABNORMAL LOW (ref >59)
Globulin, Total: 2.8 g/dL (ref 1.5–4.5)
Glucose: 288 mg/dL — ABNORMAL HIGH (ref 65–99)
Potassium: 5 mmol/L (ref 3.5–5.2)
Sodium: 133 mmol/L — ABNORMAL LOW (ref 134–144)
Total Protein: 6.6 g/dL (ref 6.0–8.5)

## 2020-03-15 LAB — CBC
Hematocrit: 38.9 % (ref 34.0–46.6)
Hemoglobin: 12.6 g/dL (ref 11.1–15.9)
MCH: 28.6 pg (ref 26.6–33.0)
MCHC: 32.4 g/dL (ref 31.5–35.7)
MCV: 88 fL (ref 79–97)
Platelets: 269 10*3/uL (ref 150–450)
RBC: 4.41 x10E6/uL (ref 3.77–5.28)
RDW: 13.1 % (ref 11.7–15.4)
WBC: 5.8 10*3/uL (ref 3.4–10.8)

## 2020-03-15 LAB — TSH+FREE T4
Free T4: 1.55 ng/dL (ref 0.82–1.77)
TSH: 0.603 u[IU]/mL (ref 0.450–4.500)

## 2020-03-15 LAB — HEMOGLOBIN A1C
Est. average glucose Bld gHb Est-mCnc: 315 mg/dL
Hgb A1c MFr Bld: 12.6 % — ABNORMAL HIGH (ref 4.8–5.6)

## 2020-03-15 LAB — LIPID PANEL
Chol/HDL Ratio: 1.9 ratio (ref 0.0–4.4)
Cholesterol, Total: 168 mg/dL (ref 100–199)
HDL: 89 mg/dL (ref >39)
LDL Chol Calc (NIH): 64 mg/dL (ref 0–99)
Triglycerides: 80 mg/dL (ref 0–149)
VLDL Cholesterol Cal: 15 mg/dL (ref 5–40)

## 2020-03-15 LAB — VITAMIN B12: Vitamin B-12: 843 pg/mL (ref 232–1245)

## 2020-03-16 ENCOUNTER — Telehealth: Payer: Self-pay

## 2020-03-16 NOTE — Telephone Encounter (Signed)
Left the patient a message to call back for lab results. 

## 2020-03-16 NOTE — Telephone Encounter (Signed)
-----   Message from [name], NP sent at 03/15/2020  6:25 PM EST ----- Your hgb A1c is elevated at 12.6. Are you taking your tresiba? If so, how much are you taking? Are you also taking trajenta? I would like to add ozempic to your regimen for diabetes control. It is a once a week shot. You would need to come in to get some education about it in our office or we can also set up [facility] to come to your house to show you. Let me know what you think. Focus on your diet. Make sure you are eating a diabetic diet which includes cutting back on your sugar intake and carbs. Make sure you are hydrating yourself.

## 2020-03-21 ENCOUNTER — Telehealth: Payer: Self-pay | Admitting: Internal Medicine

## 2020-03-21 NOTE — Telephone Encounter (Signed)
Left message for patient to call back and schedule Medicare Annual Wellness Visit (AWV) .   Last AWV 03/01/2019   please schedule at anytime with [facility]    This should be a 45 minute visit.

## 2020-03-26 ENCOUNTER — Telehealth: Payer: Self-pay

## 2020-03-26 NOTE — Chronic Care Management (AMB) (Addendum)
Chronic Care Management Pharmacy Assistant   Name: Helen Richardson  MRN: [id number] DOB: 30-Dec-1933  Reason for Encounter: Medication Review  .  PCP : Dorothyann Peng, MD  Allergies:   Allergies  Allergen Reactions   Tradjenta [Linagliptin] Swelling    Medications: Outpatient Encounter Medications as of 03/26/2020  Medication Sig Note   Alcohol Swabs (ALCOHOL PREP) 70 % PADS  03/10/2013: Received from: External Pharmacy   aspirin 81 MG chewable tablet Chew 81 mg by mouth at bedtime.    Calcium Carbonate-Vitamin D (CALTRATE 600+D PO) Take 1 tablet by mouth daily.    carvedilol (COREG) 6.25 MG tablet TAKE 1 TABLET BY MOUTH TWICE DAILY    diclofenac sodium (VOLTAREN) 1 % GEL APPLY TO AFFECTED AREA 3 TIMES DAILY AS NEEDED (Patient taking differently: Apply 2 g topically 3 (three) times daily as needed (pain). )    furosemide (LASIX) 40 MG tablet TAKE 1 TABLET(40 MG) BY MOUTH DAILY    glucose blood test strip Test up to 4 times daily    Insulin Pen Needle 32G X 4 MM MISC Use with insulin Dx code e11.65    nystatin (NYSTATIN) powder Apply 1 application topically 3 (three) times daily.    polyethylene glycol powder (GLYCOLAX/MIRALAX) 17 GM/SCOOP powder polyethylene glycol 3350 17 gram/dose oral powder  MIX 1 CAPFUL IN DRINK AND TK PO 1 TO 3 TIMES DAILY PRF DAILY SOFT STOOLS    potassium chloride SA (KLOR-CON) 20 MEQ tablet Take 1 tablet (20 mEq total) by mouth once for 1 dose. Please d/c 2 rx for 2 times per day thanks    rosuvastatin (CRESTOR) 10 MG tablet TAKE 1 TABLET BY MOUTH MONDAY-FRIDAY. DO NOT TAKE ON SATURDAY OR SUNDAY    rosuvastatin (CRESTOR) 20 MG tablet Take 1 tablet (20 mg total) by mouth daily.    telmisartan (MICARDIS) 80 MG tablet TAKE 1 TABLET(80 MG) BY MOUTH DAILY    TRADJENTA 5 MG TABS tablet TK 1 T PO QD    TRESIBA FLEXTOUCH 200 UNIT/ML FlexTouch Pen INJECT 36 UNITS UNDER THE SKIN DAILY. MAX TITRATION 80 UNITS    UNABLE TO FIND lancets 30 gauge    UNABLE TO FIND  Comfort EZ Pen Needles 32 gauge x 5/32"    No facility-administered encounter medications on file as of 03/26/2020.    Current Diagnosis: Patient Active Problem List   Diagnosis Date Noted   Controlled type 2 diabetes mellitus with stable proliferative retinopathy of both eyes, with long-term current use of insulin (HCC) 07/04/2019   Intermediate stage nonexudative age-related macular degeneration of both eyes 07/04/2019   Posterior vitreous detachment of right eye 07/04/2019   Degenerative retinal drusen of both eyes 07/04/2019   Chorioretinal scar 07/04/2019   Pincer nail deformity 04/12/2019   AKI (acute kidney injury) (HCC) 06/11/2018   Sepsis due to gram-negative UTI (HCC) 06/11/2018   Acute lower UTI (urinary tract infection) 06/10/2018   HTN (hypertension) 06/10/2018   Joint swelling 06/10/2018   Hyperkalemia 06/10/2018   Hyponatremia 06/10/2018   Normocytic anemia 06/10/2018   Type 2 diabetes mellitus with stage 3 chronic kidney disease, with long-term current use of insulin (HCC) 03/08/2018   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 Per [employer]  medication adherence for cholesterol 100 % med compliance/ medication adherence for hypertension is 90-99% med compliance .    Helen Richardson,CPP Notified  Helen Richardson, [facility] Clinical Pharmacist Assistant [phone]   I have reviewed the care management and care coordination activities outlined in this encounter and I am certifying that I agree with the content of this note. No further action required.  3 minutes spent in review, coordination, and documentation.  Helen Richardson, [facility] 03/27/20 9:24 AM

## 2020-03-27 ENCOUNTER — Telehealth: Payer: Self-pay

## 2020-03-27 NOTE — Telephone Encounter (Signed)
Left a message I was returning a call to give the pt her lab results or they can view on MyChart.

## 2020-03-27 NOTE — Telephone Encounter (Signed)
-----   Message from Ramandeep Ghumman, NP sent at 03/15/2020  6:25 PM EST ----- Your hgb A1c is elevated at 12.6. Are you taking your tresiba? If so, how much are you taking? Are you also taking trajenta? I would like to add ozempic to your regimen for diabetes control. It is a once a week shot. You would need to come in to get some education about it in our office or we can also set up [facility] to come to your house to show you. Let me know what you think. Focus on your diet. Make sure you are eating a diabetic diet which includes cutting back on your sugar intake and carbs. Make sure you are hydrating yourself.  

## 2020-04-03 ENCOUNTER — Telehealth: Payer: Self-pay

## 2020-04-03 NOTE — Chronic Care Management (AMB) (Addendum)
Chronic Care Management Pharmacy Assistant   Name: Helen Richardson  MRN: [id number] DOB: 07/08/1933  Reason for Encounter: Medication Review  Patient Questions:  1.  Have you seen any other providers since your last visit? Yes, 03/14/2020- Helen Ivory, NP (PCP)  2.  Any changes in your medicines or health? Yes, Nystatin 1000 grams- 1 application topically three times a day started.  PCP : Dorothyann Peng, MD  Allergies:   Allergies  Allergen Reactions   Tradjenta [Linagliptin] Swelling    Medications: Outpatient Encounter Medications as of 04/03/2020  Medication Sig Note   Alcohol Swabs (ALCOHOL PREP) 70 % PADS  03/10/2013: Received from: External Pharmacy   aspirin 81 MG chewable tablet Chew 81 mg by mouth at bedtime.    Calcium Carbonate-Vitamin D (CALTRATE 600+D PO) Take 1 tablet by mouth daily.    carvedilol (COREG) 6.25 MG tablet TAKE 1 TABLET BY MOUTH TWICE DAILY    diclofenac sodium (VOLTAREN) 1 % GEL APPLY TO AFFECTED AREA 3 TIMES DAILY AS NEEDED (Patient taking differently: Apply 2 g topically 3 (three) times daily as needed (pain). )    furosemide (LASIX) 40 MG tablet TAKE 1 TABLET(40 MG) BY MOUTH DAILY    glucose blood test strip Test up to 4 times daily    Insulin Pen Needle 32G X 4 MM MISC Use with insulin Dx code e11.65    nystatin (NYSTATIN) powder Apply 1 application topically 3 (three) times daily.    polyethylene glycol powder (GLYCOLAX/MIRALAX) 17 GM/SCOOP powder polyethylene glycol 3350 17 gram/dose oral powder  MIX 1 CAPFUL IN DRINK AND TK PO 1 TO 3 TIMES DAILY PRF DAILY SOFT STOOLS    potassium chloride SA (KLOR-CON) 20 MEQ tablet Take 1 tablet (20 mEq total) by mouth once for 1 dose. Please d/c 2 rx for 2 times per day thanks    rosuvastatin (CRESTOR) 10 MG tablet TAKE 1 TABLET BY MOUTH MONDAY-FRIDAY. DO NOT TAKE ON SATURDAY OR SUNDAY    rosuvastatin (CRESTOR) 20 MG tablet Take 1 tablet (20 mg total) by mouth daily.    telmisartan (MICARDIS) 80 MG tablet  TAKE 1 TABLET(80 MG) BY MOUTH DAILY    TRADJENTA 5 MG TABS tablet TK 1 T PO QD    TRESIBA FLEXTOUCH 200 UNIT/ML FlexTouch Pen INJECT 36 UNITS UNDER THE SKIN DAILY. MAX TITRATION 80 UNITS    UNABLE TO FIND lancets 30 gauge    UNABLE TO FIND Comfort EZ Pen Needles 32 gauge x 5/32"    No facility-administered encounter medications on file as of 04/03/2020.    Current Diagnosis: Patient Active Problem List   Diagnosis Date Noted   Controlled type 2 diabetes mellitus with stable proliferative retinopathy of both eyes, with long-term current use of insulin (HCC) 07/04/2019   Intermediate stage nonexudative age-related macular degeneration of both eyes 07/04/2019   Posterior vitreous detachment of right eye 07/04/2019   Degenerative retinal drusen of both eyes 07/04/2019   Chorioretinal scar 07/04/2019   Pincer nail deformity 04/12/2019   AKI (acute kidney injury) (HCC) 06/11/2018   Sepsis due to gram-negative UTI (HCC) 06/11/2018   Acute lower UTI (urinary tract infection) 06/10/2018   HTN (hypertension) 06/10/2018   Joint swelling 06/10/2018   Hyperkalemia 06/10/2018   Hyponatremia 06/10/2018   Normocytic anemia 06/10/2018   Type 2 diabetes mellitus with stage 3 chronic kidney disease, with long-term current use of insulin (HCC) 03/08/2018   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   OV-03/14/2020- Helen Ivory, NP (PCP) since last care coordination call/Pharmacist visit.   Medication changes indicated:  Nystatin 1000 grams-1 application topically three times a day started.  BP Readings from Last 3 Encounters:  03/14/20 128/68  12/14/18 140/72  07/12/18 120/84    Lab Results  Component Value Date   HGBA1C 12.6 (H)  03/14/2020     Patient obtains medications through Adherence Packaging  30 Days   Last adherence delivery included:  Carvedilol 6.25 mg- 1 tablet twice daily (before breakfast and evening meal)  Telmisartan 80 mg - 1 tablet daily (before breakfast)  Rosuvastatin 20 mg- 1 tablet daily (bedtime) Potassium 20 meq- 1 tablet daily (breakfast) Furosemide 40 mg- 1 tablet daily (before breakfast)  Patient declined the following medications last month:  Pen Needle 32G due to receiving a 90 day supply on 12/08/2019 patient has 1 brand new box left. Helen Richardson FlexTouch 200 u/ml- 36 units under skin daily due to having 7 boxes (3 pens in each) on hand.   Patient is due for next adherence delivery on: 04/12/2020. 04/03/2020- Called patient to review medications and coordinate delivery, no answer, left message to return call. 04/11/2020- Granddaughter called pharmacy stating patient is almost of medications and needed delivery, Called granddaughter Morrie Sheldon, no answer, left message to return call. Called again, no answer, pharmacy updated with medications to deliver.  This delivery to include: Carvedilol 6.25 mg- 1 tablet twice daily (before breakfast and evening meal)  Telmisartan 80 mg - 1 tablet daily (before breakfast)  Rosuvastatin 20 mg- 1 tablet daily (bedtime) Potassium 20 meq- 1 tablet daily (breakfast) Furosemide 40 mg- 1 tablet daily (before breakfast) Pen Needle 32G needles- Use with insulin daily  No short fill or acute fill needed.   Patient declined the following medications: Tresiba FlexTouch 200 u/ml- 36 units under skin daily due to having 6 boxes (3 pens in each) on hand.   Patient needs refills for- None.  Confirmed delivery date of 04/12/2020, pharmacy will contact them the morning of delivery.  Follow-Up:  Coordination of Enhanced Pharmacy Services and Pharmacist Review  Cherylin Mylar, CPP notified.  [name], CMA Clinical Pharmacist Assistant [phone]  I have  reviewed the care management and care coordination activities outlined in this encounter and I am certifying that I agree with the content of this note. No further action required.  1 minutes spent in review, coordination, and documentation.  Harlan Stains, [facility] 04/28/20 3:23 PM

## 2020-04-04 ENCOUNTER — Other Ambulatory Visit: Payer: Self-pay

## 2020-04-04 ENCOUNTER — Ambulatory Visit: Payer: Medicare PPO | Admitting: Nurse Practitioner

## 2020-04-04 ENCOUNTER — Encounter: Payer: Self-pay | Admitting: Nurse Practitioner

## 2020-04-04 ENCOUNTER — Ambulatory Visit (INDEPENDENT_AMBULATORY_CARE_PROVIDER_SITE_OTHER): Payer: Medicare Other | Admitting: Nurse Practitioner

## 2020-04-04 VITALS — BP 122/76 | HR 102 | Temp 98.3°F | Ht 63.0 in | Wt 203.4 lb

## 2020-04-04 DIAGNOSIS — E1165 Type 2 diabetes mellitus with hyperglycemia: Secondary | ICD-10-CM | POA: Diagnosis not present

## 2020-04-04 DIAGNOSIS — L02421 Furuncle of right axilla: Secondary | ICD-10-CM | POA: Diagnosis not present

## 2020-04-04 DIAGNOSIS — W19XXXA Unspecified fall, initial encounter: Secondary | ICD-10-CM | POA: Diagnosis not present

## 2020-04-04 IMAGING — DX PORTABLE CHEST - 1 VIEW
1 series · 1 of 1 positions shown · non-contrast
Comparison: None.

CLINICAL DATA: Weakness.

EXAM:
PORTABLE CHEST 1 VIEW

[chest]
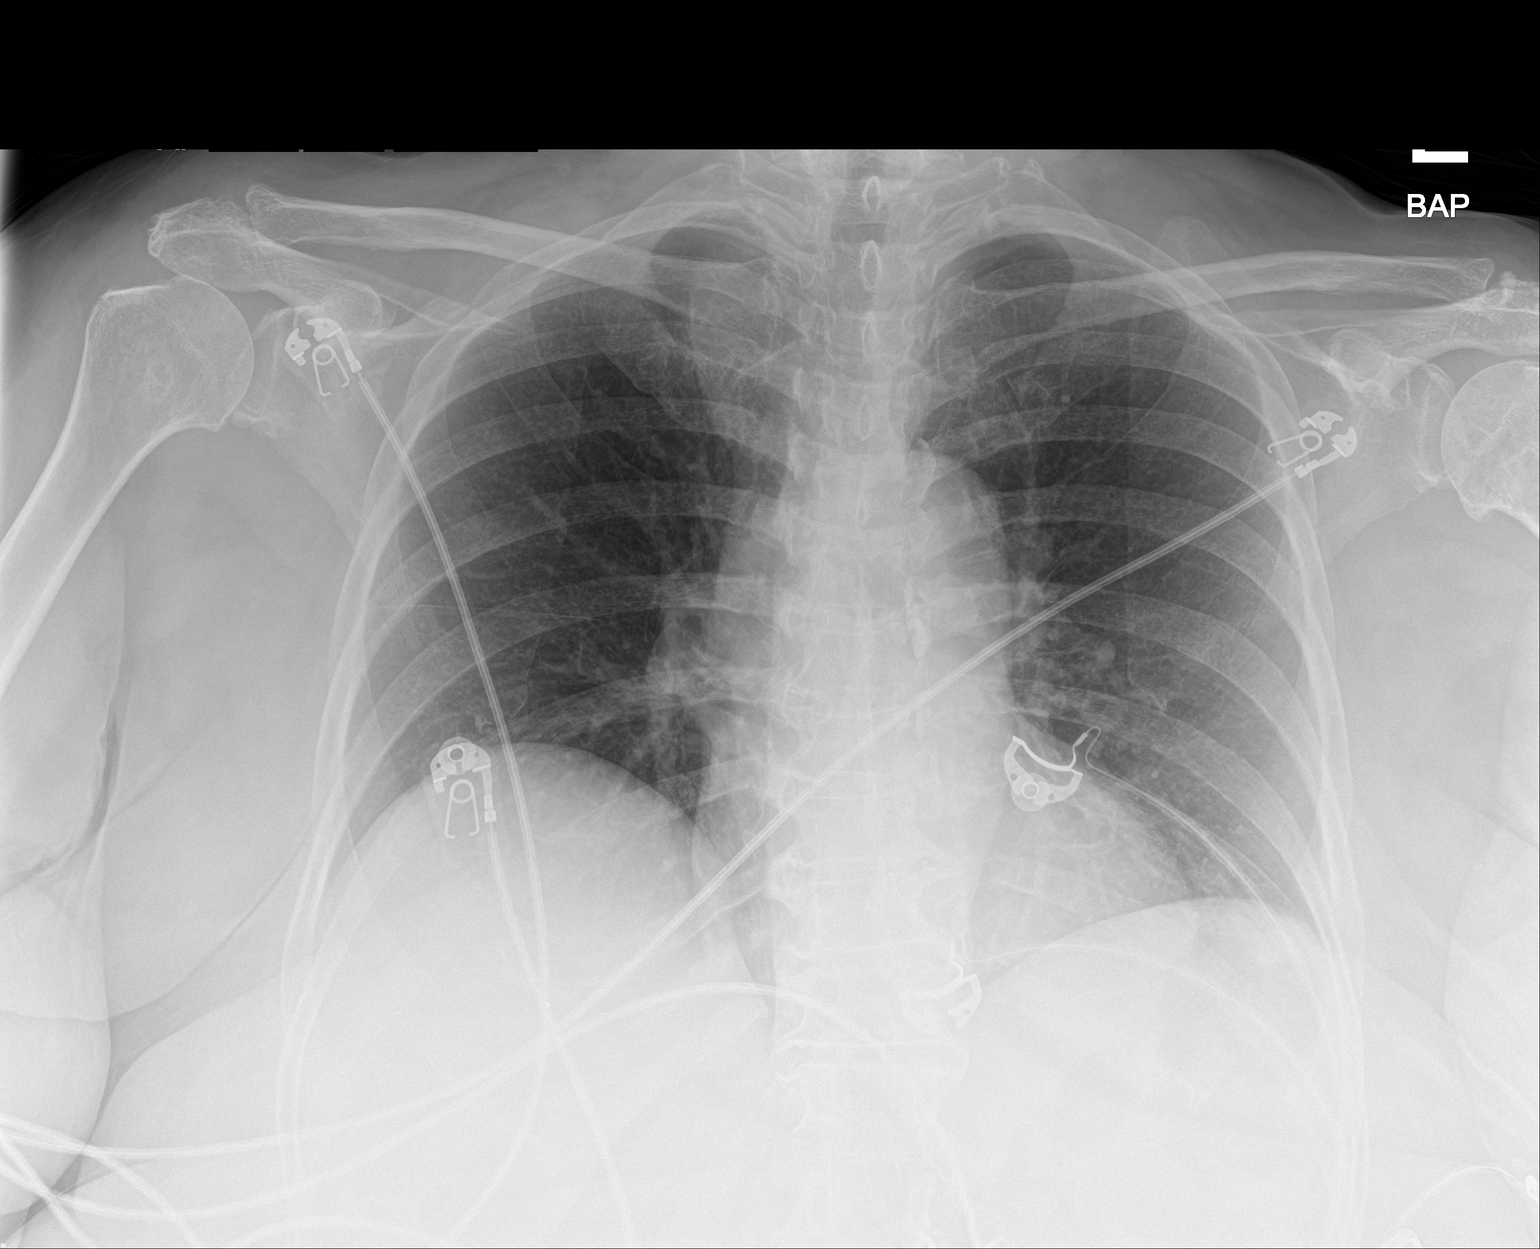

[1 of 1 positions shown; findings below may reference images not displayed]

FINDINGS: Low lung volumes. Normal heart size. No consolidation or edema.
Osteopenia.
IMPRESSION: Low lung volumes.  No active disease.

## 2020-04-04 NOTE — Progress Notes (Signed)
I,Yamilka Roman [employer] as a Neurosurgeon for [employer], FNP.,have documented all relevant documentation on the behalf of Arnette Felts, FNP,as directed by  Arnette Felts, FNP while in the presence of Arnette Felts, FNP. This visit occurred during the SARS-CoV-2 public health emergency.  Safety protocols were in place, including screening questions prior to the visit, additional usage of staff PPE, and extensive cleaning of exam room while observing appropriate contact time as indicated for disinfecting solutions.  Subjective:     Patient ID: Helen Richardson , female    DOB: April 06, 1933 , 87 y.o.   MRN: [id number]   Chief Complaint  Patient presents with  . Recurrent Skin Infections    Patient stated she has a boil under her right arm. She stated one of the boils  burst but she has another one.  . Fall    Patient stated she had a fall last week     HPI  She has a boil under her right arm that opened - she thinks it was two of them but one opened. She had some swelling.  Denies this happening in the past.    Her blood sugar was normal when it was checked after falling. Reports her blood sugars have been higher the last few days.  Fall The accident occurred more than 1 week ago. Fall occurred: while getting up from toilet and she fell - says she was dizzy prior to.  The point of impact was the face (right side of head above eye ). Pertinent negatives include no abdominal pain, headaches or loss of consciousness.     Past Medical History:  Diagnosis Date  . CHF (congestive heart failure) (HCC)   . Diabetes mellitus without complication (HCC)   . High cholesterol   . HTN (hypertension)   . Obesity      Family History  Problem Relation Age of Onset  . Diabetes Mother   . Hypertension Mother   . Hypertension Father   . Stroke Father      Current Outpatient Medications:  .  Alcohol Swabs (ALCOHOL PREP) 70 % PADS, , Disp: , Rfl:  .  aspirin 81 MG chewable tablet, Chew 81 mg by  mouth at bedtime., Disp: , Rfl:  .  Calcium Carbonate-Vitamin D (CALTRATE 600+D PO), Take 1 tablet by mouth daily., Disp: , Rfl:  .  carvedilol (COREG) 6.25 MG tablet, TAKE 1 TABLET BY MOUTH TWICE DAILY, Disp: 180 tablet, Rfl: 1 .  diclofenac sodium (VOLTAREN) 1 % GEL, APPLY TO AFFECTED AREA 3 TIMES DAILY AS NEEDED (Patient taking differently: Apply 2 g topically 3 (three) times daily as needed (pain).), Disp: 100 g, Rfl: 1 .  furosemide (LASIX) 40 MG tablet, TAKE 1 TABLET(40 MG) BY MOUTH DAILY, Disp: 90 tablet, Rfl: 1 .  glucose blood test strip, Test up to 4 times daily, Disp: 200 each, Rfl: 12 .  Insulin Pen Needle 32G X 4 MM MISC, Use with insulin Dx code e11.65, Disp: 100 each, Rfl: 3 .  nystatin (NYSTATIN) powder, Apply 1 application topically 3 (three) times daily., Disp: 15 g, Rfl: 0 .  polyethylene glycol powder (GLYCOLAX/MIRALAX) 17 GM/SCOOP powder, polyethylene glycol 3350 17 gram/dose oral powder  MIX 1 CAPFUL IN DRINK AND TK PO 1 TO 3 TIMES DAILY PRF DAILY SOFT STOOLS, Disp: , Rfl:  .  rosuvastatin (CRESTOR) 10 MG tablet, TAKE 1 TABLET BY MOUTH MONDAY-FRIDAY. DO NOT TAKE ON SATURDAY OR SUNDAY, Disp: 90 tablet, Rfl: 1 .  rosuvastatin (CRESTOR)  20 MG tablet, Take 1 tablet (20 mg total) by mouth daily., Disp: 90 tablet, Rfl: 1 .  telmisartan (MICARDIS) 80 MG tablet, TAKE 1 TABLET(80 MG) BY MOUTH DAILY, Disp: 90 tablet, Rfl: 1 .  TRADJENTA 5 MG TABS tablet, TK 1 T PO QD, Disp: , Rfl:  .  TRESIBA FLEXTOUCH 200 UNIT/ML FlexTouch Pen, INJECT 36 UNITS UNDER THE SKIN DAILY. MAX TITRATION 80 UNITS, Disp: 45 mL, Rfl: 1 .  UNABLE TO FIND, lancets 30 gauge, Disp: , Rfl:  .  UNABLE TO FIND, Comfort EZ Pen Needles 32 gauge x 5/32", Disp: , Rfl:  .  potassium chloride SA (KLOR-CON) 20 MEQ tablet, Take 1 tablet (20 mEq total) by mouth once for 1 dose. Please d/c 2 rx for 2 times per day thanks, Disp: 90 tablet, Rfl: 1   Allergies  Allergen Reactions  . Tradjenta [Linagliptin] Swelling     Review  of Systems  Constitutional: Negative.   Respiratory: Negative.   Cardiovascular: Negative.  Negative for chest pain, palpitations and leg swelling.  Gastrointestinal: Negative for abdominal pain.  Skin:       Underneath right arm with "bump" that has opened  Neurological: Negative for dizziness (none now), loss of consciousness and headaches.  Psychiatric/Behavioral: Negative.      Today's Vitals   04/04/20 1420  BP: 122/76  Pulse: (!) 102  Temp: 98.3 F (36.8 C)  TempSrc: Oral  Weight: 203 lb 6.4 oz (92.3 kg)  Height: 5\' 3"  (1.6 m)  PainSc: 0-No pain   Body mass index is 36.03 kg/m.   Objective:  Physical Exam Vitals reviewed.  Constitutional:      General: She is not in acute distress.    Appearance: Normal appearance.  Cardiovascular:     Rate and Rhythm: Normal rate and regular rhythm.     Pulses: Normal pulses.     Heart sounds: Normal heart sounds. No murmur heard.   Pulmonary:     Effort: Pulmonary effort is normal. No respiratory distress.     Breath sounds: Normal breath sounds.  Skin:    General: Skin is warm and dry.     Capillary Refill: Capillary refill takes less than 2 seconds.     Findings: Erythema (underneath right arm) present. No rash.     Comments: Right underneath arm has open boil, reddened around  Neurological:     General: No focal deficit present.     Mental Status: She is alert and oriented to person, place, and time.     Cranial Nerves: No cranial nerve deficit.     Motor: No weakness.  Psychiatric:        Mood and Affect: Mood normal.        Behavior: Behavior normal.        Thought Content: Thought content normal.        Judgment: Judgment normal.         Assessment And Plan:     1. Furuncle of right axilla  This area has opened up and overall looks good with slight redness noted  Cleansed with NS and applied a non stick dressing  Advised to continue to apply neosporin to area and cover when possible  2. Fall, initial  encounter  Had a fall a few weeks earlier hit just above her eye on right. This area is healing  She is encouraged to make sure her pathway is clear when walking and use walker as needed  This could have been related  to a vagal response or orthostatic hypotension episode.  She is no longer having dizziness   3. Uncontrolled type 2 diabetes mellitus with hyperglycemia (HCC)  I have given the information to the family member and patient in office today about taking a once a week medication for her diabetes. The niece is to call back to the office.   Patient was given opportunity to ask questions. Patient verbalized understanding of the plan and was able to repeat key elements of the plan. All questions were answered to their satisfaction.  Arnette Felts, FNP   I, Arnette Felts, FNP, have reviewed all documentation for this visit. The documentation on 04/29/20 for the exam, diagnosis, procedures, and orders are all accurate and complete.   THE PATIENT IS ENCOURAGED TO PRACTICE SOCIAL DISTANCING DUE TO THE COVID-19 PANDEMIC.

## 2020-04-04 NOTE — Patient Instructions (Signed)
Get some over the counter neosporin to apply underneath your arm.   Cleanse the area with soap and water daily and apply neosporin or antibiotic ointment over the counter with a non stick pad daily. This will take a couple weeks to close completely. If not better call the office. Take oral antibiotic (cephalexin) until completely gone.   Think about taking a once a week injection for your diabetes and call us back to let us know.

## 2020-04-06 DIAGNOSIS — M6281 Muscle weakness (generalized): Secondary | ICD-10-CM | POA: Diagnosis not present

## 2020-04-06 DIAGNOSIS — R262 Difficulty in walking, not elsewhere classified: Secondary | ICD-10-CM | POA: Diagnosis not present

## 2020-04-06 DIAGNOSIS — Z9181 History of falling: Secondary | ICD-10-CM | POA: Diagnosis not present

## 2020-04-10 ENCOUNTER — Telehealth: Payer: Self-pay

## 2020-04-10 ENCOUNTER — Telehealth: Payer: Medicare PPO

## 2020-04-11 NOTE — Telephone Encounter (Cosign Needed)
   04/11/2020  Seeley Southgate Mcgourty [birth year] [id number]    A second unsuccessful telephone outreach was attempted today. The patient was referred to the case management team for assistance with care management and care coordination.   Follow Up Plan: Telephone follow up appointment with care management team member scheduled for: 05/11/20  Delsa Sale, RN, BSN, CCM Care Management Coordinator [facility]  Direct Phone: [phone]

## 2020-04-12 ENCOUNTER — Telehealth: Payer: Self-pay

## 2020-04-12 ENCOUNTER — Other Ambulatory Visit: Payer: Self-pay

## 2020-04-12 MED ORDER — POTASSIUM CHLORIDE CRYS ER 20 MEQ PO TBCR: 20.0000 meq | EXTENDED_RELEASE_TABLET | Freq: Once | ORAL | 1 refills | Status: DC

## 2020-04-12 NOTE — Telephone Encounter (Signed)
I returned the pt's call to see what it was that she needed to speak to Dr Allyne Gee about so that I can get the message to Dr Allyne Gee.

## 2020-04-17 ENCOUNTER — Ambulatory Visit: Payer: Medicare Other | Admitting: Internal Medicine

## 2020-04-17 NOTE — Progress Notes (Deleted)
Cardiology Office Note:    Date:  04/17/2020   ID:  Helen Richardson, DOB Sep 30, 1933, MRN [id number]  PCP:  Dorothyann Peng, MD   [facility]  Cardiologist:  No primary care provider on file. *** Advanced Practice Provider:  No care team member to display Electrophysiologist:  None  {Press F2 to show EP APP, CHF, sleep or structural heart MD               :[id number]}  { Click here to update then REFRESH NOTE - MD (PCP) or APP (Team Member)  Change PCP Type for MD, Specialty for APP is either Cardiology or Clinical Cardiac Electrophysiology  :[id number]}   CC: *** Consulted for the evaluation of HF at the behest of Dorothyann Peng, MD   History of Present Illness:    Helen Richardson is a 87 y.o. female with a hx of HF NOS, HLD with DM, Morbid Obesity/Diabetes/HTN, who presents for evaluation 04/17/20.  Patient notes that she is feeling ***.  Has had no chest pain, chest pressure, chest tightness, chest stinging ***.  Discomfort occurs with ***, worsens with ***, and improves with ***.  Patient exertion notable for *** with *** and feels no symptoms.  No shortness of breath, DOE ***.  No PND or orthopnea***.  No bendopnea***, weight gain***, leg swelling ***, or abdominal swelling***.  No syncope or near syncope ***. Notes *** no palpitations or funny heart beats.     Patient reports prior cardiac testing including *** echo, *** stress test, *** heart catheterizations, *** cardioversion, *** ablations.  No history of ***pre-eclampsia, early menarche, or prematurity.  No Fen-Phen or drug use***.  Ambulatory BP ***.   Past Medical History:  Diagnosis Date  . CHF (congestive heart failure) (HCC)   . Diabetes mellitus without complication (HCC)   . High cholesterol   . HTN (hypertension)   . Obesity     Past Surgical History:  Procedure Laterality Date  . CATARACT EXTRACTION, BILATERAL    . ingrown toenail Left    left great toenail  . PARTIAL HYSTERECTOMY       Current Medications: No outpatient medications have been marked as taking for the 04/17/20 encounter (Appointment) with Christell Constant, MD.     Allergies:   Tradjenta [linagliptin]   Social History   Socioeconomic History  . Marital status: Widowed    Spouse name: Not on file  . Number of children: Not on file  . Years of education: Not on file  . Highest education level: Not on file  Occupational History  . Occupation: retired  Tobacco Use  . Smoking status: Never Smoker  . Smokeless tobacco: Never Used  Vaping Use  . Vaping Use: Never used  Substance and Sexual Activity  . Alcohol use: No  . Drug use: No  . Sexual activity: Not Currently  Other Topics Concern  . Not on file  Social History Narrative  . Not on file   Social Determinants of Health   Financial Resource Strain: Low Risk   . Difficulty of Paying Living Expenses: Not hard at all  Food Insecurity: Not on file  Transportation Needs: Unmet Transportation Needs  . Lack of Transportation (Medical): Yes  . Lack of Transportation (Non-Medical): No  Physical Activity: Not on file  Stress: Not on file  Social Connections: Not on file     Family History: The patient's family history includes Diabetes in her mother; Hypertension in her father and  mother; Stroke in her father. History of coronary artery disease notable for ***. History of heart failure notable for ***. History of arrhythmia notable for ***.  ROS:   Please see the history of present illness.    *** All other systems reviewed and are negative.  EKGs/Labs/Other Studies Reviewed:    The following studies were reviewed today: ***  EKG:   04/17/20: ***  Transthoracic Echocardiogram: Date: 04/17/20 Results: IMPRESSIONS  1. The left ventricle has normal systolic function with an ejection  fraction of 60-65%. The cavity size was normal. Left ventricular diastolic  parameters were normal.  2. The right ventricle has normal  systolic function. The cavity was  normal. There is no increase in right ventricular wall thickness.  3. Mild thickening of the mitral valve leaflet.  4. The aortic valve is tricuspid. Mild thickening of the aortic valve.  Mild calcification of the aortic valve.    Recent Labs: 03/14/2020: ALT 8; BUN 15; Creatinine, Ser 1.01; Hemoglobin 12.6; Platelets 269; Potassium 5.0; Sodium 133; TSH 0.603  Recent Lipid Panel    Component Value Date/Time   CHOL 168 03/14/2020 1616   TRIG 80 03/14/2020 1616   HDL 89 03/14/2020 1616   CHOLHDL 1.9 03/14/2020 1616   LDLCALC 64 03/14/2020 1616     Risk Assessment/Calculations:   {Does this patient have ATRIAL FIBRILLATION?:[id number]}   Physical Exam:    VS:  There were no vitals taken for this visit.    Wt Readings from Last 3 Encounters:  04/04/20 203 lb 6.4 oz (92.3 kg)  03/14/20 202 lb 6.4 oz (91.8 kg)  03/01/19 177 lb (80.3 kg)     GEN: *** Well nourished, well developed in no acute distress HEENT: Normal NECK: No JVD; No carotid bruits LYMPHATICS: No lymphadenopathy CARDIAC: ***RRR, no murmurs, rubs, gallops RESPIRATORY:  Clear to auscultation without rales, wheezing or rhonchi  ABDOMEN: Soft, non-tender, non-distended MUSCULOSKELETAL:  No edema; No deformity  SKIN: Warm and dry NEUROLOGIC:  Alert and oriented x 3 PSYCHIATRIC:  Normal affect   ASSESSMENT:    No diagnosis found. PLAN:    In order of problems listed above:   Heart Failure *** Ejection Fraction  - NYHA class ***, Stage ***, ***volemic, etiology from *** - Diuretic regimen: Lasix/torsemide/Bumex +/- metolazone *** - Strict I/Os, daily weights, and fluid restriction of < 2 L  - Replace electrolytes PRN and keep K>4 and Mg>2. - *** BMP/BNP/Mg  - Coreg/bisoprolol/metoprolol***  - ARNI/ARB/ACEi*** - aldactone*** - isordil/hydralazine for afterload reduction***  - TTE ordered *** - SGLT2i*** - Device Indications: *** - Clinical Trials: *** - Indication  for AHF: *** -Ferritin *** -HIV- *** - RF and ANA *** - SPEP ***  *** Essential Hypertension, Diabetes with hypertension, Obesity/DM/HTN - ambulatory blood pressure ***, will start/continue ambulatory BP monitoring; gave education on how to perform ambulatory blood pressure monitoring including the frequency and technique; goal ambulatory blood pressure < 135/85 on average - continue home medications with the exception of *** - will get labs in 7-10 days (BMET, Mg***) - OSA Risk ***, Epworth Sleepiness Scale great than *** - concern for secondary HTN, will order renal artery duplex, and Aldo/renin***  - Arm/Leg BP Differential < 20 mm Hg; not suggestive of coaracation; Arm BP differential < 15 mm Hg not suggestive of subclavian stenosis - discussed diet (DASH/low sodium), and exercise/weight loss interventions ***  Hyperlipidemia (familial/mixed) -LDL goal less than ***70/100 - Fasting triglycerides notable for -continue current statin - Zetia vs Repatha*** -  Bempedoic acid 180 mg PO daily *** tendon rupture - Vascepa*** - low threshold to send to lipid clinic for additional patient support *** - would stop niacin as it can raise blood sugars and increase uric acid*** - would stop OTC fish oil has little cardiovascular protection, is not regulated by FDA as it is actually a "supplement" and can continue incorrect amounts of DHA/EPA or harmful fats*** -Recheck lipid profile LFTs - gave education on dietary changes    {Are you ordering a CV Procedure (e.g. stress test, cath, DCCV, TEE, etc)?   Press F2        :[id number]}    Medication Adjustments/Labs and Tests Ordered: Current medicines are reviewed at length with the patient today.  Concerns regarding medicines are outlined above.  No orders of the defined types were placed in this encounter.  No orders of the defined types were placed in this encounter.   There are no Patient Instructions on file for this visit.    Signed, Christell Constant, MD  04/17/2020 10:04 AM    [facility]

## 2020-04-23 ENCOUNTER — Encounter: Payer: Self-pay | Admitting: General Practice

## 2020-04-24 ENCOUNTER — Ambulatory Visit (INDEPENDENT_AMBULATORY_CARE_PROVIDER_SITE_OTHER): Payer: Medicare Other

## 2020-04-24 DIAGNOSIS — N183 Chronic kidney disease, stage 3 unspecified: Secondary | ICD-10-CM | POA: Diagnosis not present

## 2020-04-24 DIAGNOSIS — E1122 Type 2 diabetes mellitus with diabetic chronic kidney disease: Secondary | ICD-10-CM | POA: Diagnosis not present

## 2020-04-24 DIAGNOSIS — Z794 Long term (current) use of insulin: Secondary | ICD-10-CM | POA: Diagnosis not present

## 2020-04-24 DIAGNOSIS — I10 Essential (primary) hypertension: Secondary | ICD-10-CM

## 2020-04-24 NOTE — Chronic Care Management (AMB) (Signed)
Chronic Care Management    Social Work Note  04/24/2020 Name: Helen Richardson MRN: [id number] DOB: Feb 11, 1934  Helen Richardson is a 87 y.o. year old female who is a primary care patient of Dorothyann Peng, MD. The CCM team was consulted to assist the patient with chronic disease management and/or care coordination needs related to: caregiver resources.   Collaboration with Marye Round with [facility] in home aid program for status update on patients previous referral in response to provider referral for social work chronic care management and care coordination services.   Consent to Services:  The patient was given information about Chronic Care Management services, agreed to services, and gave verbal consent prior to initiation of services.  Please see initial visit note for detailed documentation.   Patient agreed to services and consent obtained.   Assessment: Review of patient past medical history, allergies, medications, and health status, including review of relevant consultants reports was performed today as part of a comprehensive evaluation and provision of chronic care management and care coordination services.     SDOH (Social Determinants of Health) assessments and interventions performed:    Advanced Directives Status: Not addressed in this encounter.  CCM Care Plan  Allergies  Allergen Reactions  . Tradjenta [Linagliptin] Swelling    Outpatient Encounter Medications as of 04/24/2020  Medication Sig Note  . Alcohol Swabs (ALCOHOL PREP) 70 % PADS  03/10/2013: Received from: External Pharmacy  . aspirin 81 MG chewable tablet Chew 81 mg by mouth at bedtime.   . Calcium Carbonate-Vitamin D (CALTRATE 600+D PO) Take 1 tablet by mouth daily.   . carvedilol (COREG) 6.25 MG tablet TAKE 1 TABLET BY MOUTH TWICE DAILY   . diclofenac sodium (VOLTAREN) 1 % GEL APPLY TO AFFECTED AREA 3 TIMES DAILY AS NEEDED (Patient taking differently: Apply 2 g topically 3 (three) times daily as  needed (pain).)   . furosemide (LASIX) 40 MG tablet TAKE 1 TABLET(40 MG) BY MOUTH DAILY   . glucose blood test strip Test up to 4 times daily   . Insulin Pen Needle 32G X 4 MM MISC Use with insulin Dx code e11.65   . nystatin (NYSTATIN) powder Apply 1 application topically 3 (three) times daily.   . polyethylene glycol powder (GLYCOLAX/MIRALAX) 17 GM/SCOOP powder polyethylene glycol 3350 17 gram/dose oral powder  MIX 1 CAPFUL IN DRINK AND TK PO 1 TO 3 TIMES DAILY PRF DAILY SOFT STOOLS   . potassium chloride SA (KLOR-CON) 20 MEQ tablet Take 1 tablet (20 mEq total) by mouth once for 1 dose. Please d/c 2 rx for 2 times per day thanks   . rosuvastatin (CRESTOR) 10 MG tablet TAKE 1 TABLET BY MOUTH MONDAY-FRIDAY. DO NOT TAKE ON SATURDAY OR SUNDAY   . rosuvastatin (CRESTOR) 20 MG tablet Take 1 tablet (20 mg total) by mouth daily.   [name] Kitchen telmisartan (MICARDIS) 80 MG tablet TAKE 1 TABLET(80 MG) BY MOUTH DAILY   . TRADJENTA 5 MG TABS tablet TK 1 T PO QD   . TRESIBA FLEXTOUCH 200 UNIT/ML FlexTouch Pen INJECT 36 UNITS UNDER THE SKIN DAILY. MAX TITRATION 80 UNITS   . UNABLE TO FIND lancets 30 gauge   . UNABLE TO FIND Comfort EZ Pen Needles 32 gauge x 5/32"    No facility-administered encounter medications on file as of 04/24/2020.    Patient Active Problem List   Diagnosis Date Noted  . Controlled type 2 diabetes mellitus with stable proliferative retinopathy of both eyes, with long-term current  use of insulin (HCC) 07/04/2019  . Intermediate stage nonexudative age-related macular degeneration of both eyes 07/04/2019  . Posterior vitreous detachment of right eye 07/04/2019  . Degenerative retinal drusen of both eyes 07/04/2019  . Chorioretinal scar 07/04/2019  . Pincer nail deformity 04/12/2019  . AKI (acute kidney injury) (HCC) 06/11/2018  . Sepsis due to gram-negative UTI (HCC) 06/11/2018  . Acute lower UTI (urinary tract infection) 06/10/2018  . HTN (hypertension) 06/10/2018  . Joint swelling  06/10/2018  . Hyperkalemia 06/10/2018  . Hyponatremia 06/10/2018  . Normocytic anemia 06/10/2018  . Type 2 diabetes mellitus with stage 3 chronic kidney disease, with long-term current use of insulin (HCC) 03/08/2018  . 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 Pure hypercholesterolemia 03/08/2018  . Class 3 severe obesity due to excess calories with serious comorbidity and body mass index (BMI) of 40.0 to 44.9 in adult [facility]) 03/08/2018    Conditions to be addressed/monitored: CHF, HTN and DMII; Limited access to caregiver  Care Plan : Social Work Care Plan  Updates made by Bevelyn Ngo since 04/24/2020 12:00 AM    Problem: Care Coordination     Long-Range Goal: Collaborate with RN Care Manager to perform appropriate assessments to assist with care coordination needs   Start Date: 02/02/2020  Expected End Date: 08/22/2020  Priority: Medium  Note:   Current Barriers:  . Chronic disease management support and education needs related to CHF, HTN, and DMII  . Transportation and Limited access to caregiver  Social Worker Clinical Goal(s):  [name] Kitchen Over the next 120 days, patient will work with SW to identify and address any acute and/or chronic care coordination needs related to the self health management of CHF, HTN, and DMII   CCM SW Interventions:  Current Barriers:  . 1:1 collaboration with Dorothyann Peng, MD regarding development and update of comprehensive plan of care as evidenced by provider attestation and co-signature . Inter-disciplinary care team collaboration (see longitudinal plan of care) . Collaboration with Mariam Dollar, CMA who request SW outreach the patient to assist with caregiver resources: "Hey can you help with this patient with some resources?   She's telling me she needs help 2 - 3 days per week around the house." . Performed chart review to note patient previously referred to [facility] in home aid program - SW was informed approximately 6 months ago it  was the patients "turn" but there was not enough staff to accommodate the patients needs . Unsuccessful outbound call placed to both the patient and her grand-daughter Helen Richardson to request an update on patients current care needs- voice message left for both the patient and Helen Richardson requesting a return call . Collaboration with Marye Round, Social Work Merchandiser, retail, to request an update on status of patients involvement with the in home aid program . Scheduled follow up call to the patient over the next 10 days   Patient Goals/Self-Care Activities . Over the next 90 days, patient will:   - Attend all scheduled provider appointments -Call pharmacy for medication refills -Call provider office for new concerns or questions -Contact SW as needed prior to next scheduled call  Follow Up Plan: SW will follow up with the patient over the next 10 days       Follow Up Plan: SW will follow up with patient by phone over the next 10 days.      Bevelyn Ngo, BSW, CDP Social Worker, Certified Dementia Practitioner [facility] Care Management [phone]  Total time spent performing care coordination  and/or care management activities with the patient by phone or face to face = 27 minutes.

## 2020-04-24 NOTE — Patient Instructions (Signed)
   Goals we discussed today:  Goals Addressed            This Visit's Progress   . Work with SW to manage care coordination needs       Timeframe:  Long-Range Goal Priority:  [employer] Start Date:  12.2.21                           Expected End Date:   6.22.22                    Next planned outreach: 3.3.22  Patient Goals/Self-Care Activities . Over the next 90 days, patient will:   - Attend all scheduled provider appointments -Call pharmacy for medication refills -Call provider office for new concerns or questions -Contact SW as needed prior to next scheduled call

## 2020-04-29 ENCOUNTER — Encounter: Payer: Self-pay | Admitting: Nurse Practitioner

## 2020-05-02 ENCOUNTER — Telehealth: Payer: Self-pay

## 2020-05-02 NOTE — Chronic Care Management (AMB) (Signed)
Chronic Care Management Pharmacy Assistant   Name: Helen Richardson  MRN: [id number] DOB: [birth year]  Reason for Encounter: Medication Review  Patient Questions:  1.  Have you seen any other providers since your last visit? Yes, 04/24/20-Humble, Kendra (CCM). 04/04/20-Moore, Lolita Cram, FNP (OV).     2.  Any changes in your medicines or health? No     PCP : Dorothyann Peng, MD  Allergies:   Allergies  Allergen Reactions  . Tradjenta [Linagliptin] Swelling    Medications: Outpatient Encounter Medications as of 05/02/2020  Medication Sig Note  . Alcohol Swabs (ALCOHOL PREP) 70 % PADS  03/10/2013: Received from: External Pharmacy  . aspirin 81 MG chewable tablet Chew 81 mg by mouth at bedtime.   . Calcium Carbonate-Vitamin D (CALTRATE 600+D PO) Take 1 tablet by mouth daily.   . carvedilol (COREG) 6.25 MG tablet TAKE 1 TABLET BY MOUTH TWICE DAILY   . diclofenac sodium (VOLTAREN) 1 % GEL APPLY TO AFFECTED AREA 3 TIMES DAILY AS NEEDED (Patient taking differently: Apply 2 g topically 3 (three) times daily as needed (pain).)   . furosemide (LASIX) 40 MG tablet TAKE 1 TABLET(40 MG) BY MOUTH DAILY   . glucose blood test strip Test up to 4 times daily   . Insulin Pen Needle 32G X 4 MM MISC Use with insulin Dx code e11.65   . nystatin (NYSTATIN) powder Apply 1 application topically 3 (three) times daily.   . polyethylene glycol powder (GLYCOLAX/MIRALAX) 17 GM/SCOOP powder polyethylene glycol 3350 17 gram/dose oral powder  MIX 1 CAPFUL IN DRINK AND TK PO 1 TO 3 TIMES DAILY PRF DAILY SOFT STOOLS   . potassium chloride SA (KLOR-CON) 20 MEQ tablet Take 1 tablet (20 mEq total) by mouth once for 1 dose. Please d/c 2 rx for 2 times per day thanks   . rosuvastatin (CRESTOR) 10 MG tablet TAKE 1 TABLET BY MOUTH MONDAY-FRIDAY. DO NOT TAKE ON SATURDAY OR SUNDAY   . rosuvastatin (CRESTOR) 20 MG tablet Take 1 tablet (20 mg total) by mouth daily.   [name] Kitchen telmisartan (MICARDIS) 80 MG tablet TAKE 1 TABLET(80 MG) BY  MOUTH DAILY   . TRADJENTA 5 MG TABS tablet TK 1 T PO QD   . TRESIBA FLEXTOUCH 200 UNIT/ML FlexTouch Pen INJECT 36 UNITS UNDER THE SKIN DAILY. MAX TITRATION 80 UNITS   . UNABLE TO FIND lancets 30 gauge   . UNABLE TO FIND Comfort EZ Pen Needles 32 gauge x 5/32"    No facility-administered encounter medications on file as of 05/02/2020.    Current Diagnosis: Patient Active Problem List   Diagnosis Date Noted  . Controlled type 2 diabetes mellitus with stable proliferative retinopathy of both eyes, with long-term current use of insulin (HCC) 07/04/2019  . Intermediate stage nonexudative age-related macular degeneration of both eyes 07/04/2019  . Posterior vitreous detachment of right eye 07/04/2019  . Degenerative retinal drusen of both eyes 07/04/2019  . Chorioretinal scar 07/04/2019  . Pincer nail deformity 04/12/2019  . AKI (acute kidney injury) (HCC) 06/11/2018  . Sepsis due to gram-negative UTI (HCC) 06/11/2018  . Acute lower UTI (urinary tract infection) 06/10/2018  . HTN (hypertension) 06/10/2018  . Joint swelling 06/10/2018  . Hyperkalemia 06/10/2018  . Hyponatremia 06/10/2018  . Normocytic anemia 06/10/2018  . Type 2 diabetes mellitus with stage 3 chronic kidney disease, with long-term current use of insulin (HCC) 03/08/2018  . 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 Pure hypercholesterolemia 03/08/2018  . Class 3 severe obesity  due to excess calories with serious comorbidity and body mass index (BMI) of 40.0 to 44.9 in adult [facility]) 03/08/2018   Reviewed chart for medication changes ahead of medication coordination call.  No OVs, Consults, or hospital visits since last care coordination call/Pharmacist visit. (If appropriate, list visit date, provider name)  No medication changes indicated OR if recent visit, treatment plan here.  BP Readings from Last 3 Encounters:  04/04/20 122/76  03/14/20 128/68  12/14/18 140/72    Lab Results  Component Value Date   HGBA1C 12.6  (H) 03/14/2020     Patient obtains medications through Adherence Packaging  30 Days   Last adherence delivery included: Carvedilol 6.25 mg- 1 tablet twice daily (before breakfast and evening meal)  Telmisartan80 mg- 1 tablet daily (before breakfast) Rosuvastatin 20 mg- 1 tablet daily (bedtime) Potassium 20 meq- 1 tablet daily (breakfast) Furosemide 40 mg- 1 tablet daily (before breakfast) Pen Needle 32G needles- Use with insulin daily  Patient declined the following medications last month: Tresiba FlexTouch 200 u/ml- 36 units under skin daily due to having6boxes (3 pens in each) on hand. use/additional supply on hand.   Patient is due for next adherence delivery on: 05/09/20. Called patient and reviewed medications and coordinated delivery.  05/02/20-Called the patient to coordinate medication delivery; the patient can't recall what she has or what she is needing. The patient couldn't hear later in the conversation then hung up. Attempted to call the patient back but no answer.  Unsuccessful outreach to the patient's granddaughter. Left a message.  05/07/20-Called the patient to coordinate medication delivery; No answer, left a voicemail with a call back number.  05/09/20-Third attempt to contact the patient to coordinate medication review and delivery. No answer, left a voicemail with a call back number.  9:38 AM Called and spoke with the granddaughter and she has suggested I speak with the home aid who is with the patient at 10 AM. I informed the granddaughter I will call at that time.  10:04 AM Attempted Call to the patient's home to speak with the home health aid at the time requested, no answer, left a voicemail.  12:34 PM Attempted Call to the patient's home to speak with the home health aid at the time requested, no answer.  05/11/2020-Attempted Call to the patient's house at the time home health aid arrives, no answer, left a voicemail.  11:13 AM-Successful outreach  to home health aide at the patient's home.  This delivery to include: Carvedilol 6.25 mg- 1 tablet twice daily (before breakfast and evening meal)  Telmisartan80 mg- 1 tablet daily (before breakfast) Rosuvastatin 20 mg- 1 tablet daily (bedtime) Potassium 20 meq- 1 tablet daily (breakfast) Furosemide 40 mg- 1 tablet daily (before breakfast) Caltrate Bone Health (calcium) 600 mg + D3- 1 tablet daily (breakfast).   Per home health aide, patient will need Caltrate Bone Health (calcium) 600 mg + D3 in vials (easy open caps) please.  No short fill or acute fill needed.  Patient declined the following medications: Tresiba FlexTouch 200 u/ml- 36 units under skin daily due to having3 new boxes and (3 pens in each) on hand. Pen Needle 32G needles- Use with insulin daily.  Patient needs refills for None.  Confirmed delivery date of 05/14/20, advised patient that pharmacy will contact them the morning of delivery.  Patient advised we will follow up with her next month to review medication and coordinate delivery.  Follow-Up:  Occupational hygienist and Pharmacist Review-Home Health Aide reports  they slightly fell off from checking blood pressure at home. Blood pressure was last checked in the end of February, patient's blood pressure typically runs within the normal, will start to recheck next week with the patient per home health aide. Home Health Aide voiced the patient's blood sugar was at 113 this morning before eating or drinking, patient's blood sugars typically run within the normal.  Home Health Aide requested [facility] leave medications at the door on delivery date.  Cherylin Mylar, CPP Notified.  Suezanne Cheshire, CMA Clinical Pharmacist Assistant [phone] CCM Total Time: 36 minutes

## 2020-05-03 ENCOUNTER — Ambulatory Visit (INDEPENDENT_AMBULATORY_CARE_PROVIDER_SITE_OTHER): Payer: Medicare Other

## 2020-05-03 DIAGNOSIS — I10 Essential (primary) hypertension: Secondary | ICD-10-CM

## 2020-05-03 DIAGNOSIS — E1122 Type 2 diabetes mellitus with diabetic chronic kidney disease: Secondary | ICD-10-CM

## 2020-05-03 DIAGNOSIS — N183 Chronic kidney disease, stage 3 unspecified: Secondary | ICD-10-CM | POA: Diagnosis not present

## 2020-05-03 DIAGNOSIS — Z794 Long term (current) use of insulin: Secondary | ICD-10-CM

## 2020-05-03 NOTE — Patient Instructions (Signed)
  Goals we discussed today:  Goals Addressed            This Visit's Progress   . Work with SW to manage care coordination needs       Timeframe:  Long-Range Goal Priority:  [employer] Start Date:  12.2.21                           Expected End Date:   6.22.22                    Next planned outreach: 3.10.22  Patient Goals/Self-Care Activities . Over the next 90 days, patient will:   - Attend all scheduled provider appointments -Call pharmacy for medication refills -Call provider office for new concerns or questions -Contact SW as needed prior to next scheduled call

## 2020-05-03 NOTE — Chronic Care Management (AMB) (Signed)
Chronic Care Management    Social Work Note  05/03/2020 Name: Helen Richardson MRN: [id number] DOB: Jul 04, 1933  Helen Richardson is a 87 y.o. year old female who is a primary care patient of Dorothyann Peng, MD. The CCM team was consulted to assist the patient with chronic disease management and/or care coordination needs related to: [name]  and Level of Care Concerns.   Engaged with patient by telephone for follow up visit in response to provider referral for social work chronic care management and care coordination services.   Consent to Services:  The patient was given information about Chronic Care Management services, agreed to services, and gave verbal consent prior to initiation of services.  Please see initial visit note for detailed documentation.   Patient agreed to services and consent obtained.   Assessment: Review of patient past medical history, allergies, medications, and health status, including review of relevant consultants reports was performed today as part of a comprehensive evaluation and provision of chronic care management and care coordination services.     SDOH (Social Determinants of Health) assessments and interventions performed:  SDOH Interventions   Flowsheet Row Most Recent Value  SDOH Interventions   Food Insecurity Interventions [id number] Referral  [Family assisting with meals - referred for MOW]  Housing Interventions Intervention Not Indicated  Transportation Interventions Intervention Not Indicated       Advanced Directives Status: Not addressed in this encounter.  CCM Care Plan  Allergies  Allergen Reactions  . Tradjenta [Linagliptin] Swelling    Outpatient Encounter Medications as of 05/03/2020  Medication Sig Note  . Alcohol Swabs (ALCOHOL PREP) 70 % PADS  03/10/2013: Received from: External Pharmacy  . aspirin 81 MG chewable tablet Chew 81 mg by mouth at bedtime.   . Calcium Carbonate-Vitamin D (CALTRATE 600+D PO) Take 1 tablet by  mouth daily.   . carvedilol (COREG) 6.25 MG tablet TAKE 1 TABLET BY MOUTH TWICE DAILY   . diclofenac sodium (VOLTAREN) 1 % GEL APPLY TO AFFECTED AREA 3 TIMES DAILY AS NEEDED (Patient taking differently: Apply 2 g topically 3 (three) times daily as needed (pain).)   . furosemide (LASIX) 40 MG tablet TAKE 1 TABLET(40 MG) BY MOUTH DAILY   . glucose blood test strip Test up to 4 times daily   . Insulin Pen Needle 32G X 4 MM MISC Use with insulin Dx code e11.65   . nystatin (NYSTATIN) powder Apply 1 application topically 3 (three) times daily.   . polyethylene glycol powder (GLYCOLAX/MIRALAX) 17 GM/SCOOP powder polyethylene glycol 3350 17 gram/dose oral powder  MIX 1 CAPFUL IN DRINK AND TK PO 1 TO 3 TIMES DAILY PRF DAILY SOFT STOOLS   . potassium chloride SA (KLOR-CON) 20 MEQ tablet Take 1 tablet (20 mEq total) by mouth once for 1 dose. Please d/c 2 rx for 2 times per day thanks   . rosuvastatin (CRESTOR) 10 MG tablet TAKE 1 TABLET BY MOUTH MONDAY-FRIDAY. DO NOT TAKE ON SATURDAY OR SUNDAY   . rosuvastatin (CRESTOR) 20 MG tablet Take 1 tablet (20 mg total) by mouth daily.   [name] Kitchen telmisartan (MICARDIS) 80 MG tablet TAKE 1 TABLET(80 MG) BY MOUTH DAILY   . TRADJENTA 5 MG TABS tablet TK 1 T PO QD   . TRESIBA FLEXTOUCH 200 UNIT/ML FlexTouch Pen INJECT 36 UNITS UNDER THE SKIN DAILY. MAX TITRATION 80 UNITS   . UNABLE TO FIND lancets 30 gauge   . UNABLE TO FIND Comfort EZ Pen Needles 32 gauge x 5/32"  No facility-administered encounter medications on file as of 05/03/2020.    Patient Active Problem List   Diagnosis Date Noted  . Controlled type 2 diabetes mellitus with stable proliferative retinopathy of both eyes, with long-term current use of insulin (HCC) 07/04/2019  . Intermediate stage nonexudative age-related macular degeneration of both eyes 07/04/2019  . Posterior vitreous detachment of right eye 07/04/2019  . Degenerative retinal drusen of both eyes 07/04/2019  . Chorioretinal scar 07/04/2019  .  Pincer nail deformity 04/12/2019  . AKI (acute kidney injury) (HCC) 06/11/2018  . Sepsis due to gram-negative UTI (HCC) 06/11/2018  . Acute lower UTI (urinary tract infection) 06/10/2018  . HTN (hypertension) 06/10/2018  . Joint swelling 06/10/2018  . Hyperkalemia 06/10/2018  . Hyponatremia 06/10/2018  . Normocytic anemia 06/10/2018  . Type 2 diabetes mellitus with stage 3 chronic kidney disease, with long-term current use of insulin (HCC) 03/08/2018  . 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 Pure hypercholesterolemia 03/08/2018  . Class 3 severe obesity due to excess calories with serious comorbidity and body mass index (BMI) of 40.0 to 44.9 in adult [facility]) 03/08/2018    Conditions to be addressed/monitored: CHF, HTN and DMII; Limited access to food and Limited access to caregiver  Care Plan : Social Work Care Plan  Updates made by Bevelyn Ngo since 05/03/2020 12:00 AM    Problem: Care Coordination     Long-Range Goal: Collaborate with RN Care Manager to perform appropriate assessments to assist with care coordination needs   Start Date: 02/02/2020  Expected End Date: 08/22/2020  This Visit's Progress: On track  Priority: Medium  Note:   Current Barriers:  . Chronic disease management support and education needs related to CHF, HTN, and DMII  . Transportation and Limited access to caregiver  Social Worker Clinical Goal(s):  [name] Kitchen Over the next 120 days, patient will work with SW to identify and address any acute and/or chronic care coordination needs related to the self health management of CHF, HTN, and DMII   CCM SW Interventions:  Current Barriers:  . 1:1 collaboration with Dorothyann Peng, MD regarding development and update of comprehensive plan of care as evidenced by provider attestation and co-signature . Inter-disciplinary care team collaboration (see longitudinal plan of care) . Successful outbound call placed to the patient to assist with care coordination  needs . Reviewed recent referral to SW to assist with patient care needs 2-3 days a week . Discussed the patient currently has an aid who comes to her home M-F to assist with morning care. The patient reports her children cover the cost of this service . Assessed for what patient would like assistance with  . Patient indicated she is interested in assistance in the evenings to assist with meal preparation - at this time patients family is taking turns bringing meals . Educated the patient on [place] [facility] [place] [facility] program - advised the patient this SW had previously referred the patient to this program and was last told there was not enough staff available to assist with patient needs . Discussed plans for SW to follow up with in home aid program to assess referral status . Provided education on [employer] program offered by [employer] [facility] [place] . Obtained verbal permission to place patient on waiting list . Patient stated "what can I get that I do not have to wait on?" - SW advised that both programs the patient is interested in have wait lists unless she is interested in private pay resources -  patient declined . Placed Mobile [employer] referral via [id number] . Placed unsuccessful outbound call to Marye Round with in home aid program - left voice message requesting a return call . Scheduled follow up over the next week   Patient Goals/Self-Care Activities . Over the next 90 days, patient will:   - Attend all scheduled provider appointments -Call pharmacy for medication refills -Call provider office for new concerns or questions -Contact SW as needed prior to next scheduled call  Follow Up Plan: SW will follow up with the patient over the next 10 days       Follow Up Plan: SW will follow up with patient by phone over the next week.      Bevelyn Ngo, BSW, CDP Social Worker, Certified Dementia Practitioner [facility] Care Management [phone]  Total time spent  performing care coordination and/or care management activities with the patient by phone or face to face = 37 minutes.

## 2020-05-09 ENCOUNTER — Ambulatory Visit: Payer: Medicare Other

## 2020-05-09 DIAGNOSIS — I10 Essential (primary) hypertension: Secondary | ICD-10-CM

## 2020-05-09 DIAGNOSIS — Z794 Long term (current) use of insulin: Secondary | ICD-10-CM

## 2020-05-09 DIAGNOSIS — E1122 Type 2 diabetes mellitus with diabetic chronic kidney disease: Secondary | ICD-10-CM

## 2020-05-09 NOTE — Chronic Care Management (AMB) (Signed)
Chronic Care Management    Social Work Note  05/09/2020 Name: Helen Richardson MRN: [id number] DOB: 04-Nov-1933  Helen Richardson is a 87 y.o. year old female who is a primary care patient of Dorothyann Peng, MD. The CCM team was consulted to assist the patient with chronic disease management and/or care coordination needs related to: [name] .   Collaboration with [facility] in home aid program for discussion surrounding patient case in response to provider referral for social work chronic care management and care coordination services.   Consent to Services:  The patient was given information about Chronic Care Management services, agreed to services, and gave verbal consent prior to initiation of services.  Please see initial visit note for detailed documentation.   Patient agreed to services and consent obtained.   Assessment: Review of patient past medical history, allergies, medications, and health status, including review of relevant consultants reports was performed today as part of a comprehensive evaluation and provision of chronic care management and care coordination services.     SDOH (Social Determinants of Health) assessments and interventions performed:    Advanced Directives Status: Not addressed in this encounter.  CCM Care Plan  Allergies  Allergen Reactions  . Tradjenta [Linagliptin] Swelling    Outpatient Encounter Medications as of 05/09/2020  Medication Sig Note  . Alcohol Swabs (ALCOHOL PREP) 70 % PADS  03/10/2013: Received from: External Pharmacy  . aspirin 81 MG chewable tablet Chew 81 mg by mouth at bedtime.   . Calcium Carbonate-Vitamin D (CALTRATE 600+D PO) Take 1 tablet by mouth daily.   . carvedilol (COREG) 6.25 MG tablet TAKE 1 TABLET BY MOUTH TWICE DAILY   . diclofenac sodium (VOLTAREN) 1 % GEL APPLY TO AFFECTED AREA 3 TIMES DAILY AS NEEDED (Patient taking differently: Apply 2 g topically 3 (three) times daily as needed (pain).)   .  furosemide (LASIX) 40 MG tablet TAKE 1 TABLET(40 MG) BY MOUTH DAILY   . glucose blood test strip Test up to 4 times daily   . Insulin Pen Needle 32G X 4 MM MISC Use with insulin Dx code e11.65   . nystatin (NYSTATIN) powder Apply 1 application topically 3 (three) times daily.   . polyethylene glycol powder (GLYCOLAX/MIRALAX) 17 GM/SCOOP powder polyethylene glycol 3350 17 gram/dose oral powder  MIX 1 CAPFUL IN DRINK AND TK PO 1 TO 3 TIMES DAILY PRF DAILY SOFT STOOLS   . potassium chloride SA (KLOR-CON) 20 MEQ tablet Take 1 tablet (20 mEq total) by mouth once for 1 dose. Please d/c 2 rx for 2 times per day thanks   . rosuvastatin (CRESTOR) 10 MG tablet TAKE 1 TABLET BY MOUTH MONDAY-FRIDAY. DO NOT TAKE ON SATURDAY OR SUNDAY   . rosuvastatin (CRESTOR) 20 MG tablet Take 1 tablet (20 mg total) by mouth daily.   [name] Kitchen telmisartan (MICARDIS) 80 MG tablet TAKE 1 TABLET(80 MG) BY MOUTH DAILY   . TRADJENTA 5 MG TABS tablet TK 1 T PO QD   . TRESIBA FLEXTOUCH 200 UNIT/ML FlexTouch Pen INJECT 36 UNITS UNDER THE SKIN DAILY. MAX TITRATION 80 UNITS   . UNABLE TO FIND lancets 30 gauge   . UNABLE TO FIND Comfort EZ Pen Needles 32 gauge x 5/32"    No facility-administered encounter medications on file as of 05/09/2020.    Patient Active Problem List   Diagnosis Date Noted  . Controlled type 2 diabetes mellitus with stable proliferative retinopathy of both eyes, with long-term current use of insulin (HCC)  07/04/2019  . Intermediate stage nonexudative age-related macular degeneration of both eyes 07/04/2019  . Posterior vitreous detachment of right eye 07/04/2019  . Degenerative retinal drusen of both eyes 07/04/2019  . Chorioretinal scar 07/04/2019  . Pincer nail deformity 04/12/2019  . AKI (acute kidney injury) (HCC) 06/11/2018  . Sepsis due to gram-negative UTI (HCC) 06/11/2018  . Acute lower UTI (urinary tract infection) 06/10/2018  . HTN (hypertension) 06/10/2018  . Joint swelling 06/10/2018  . Hyperkalemia  06/10/2018  . Hyponatremia 06/10/2018  . Normocytic anemia 06/10/2018  . Type 2 diabetes mellitus with stage 3 chronic kidney disease, with long-term current use of insulin (HCC) 03/08/2018  . 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 Pure hypercholesterolemia 03/08/2018  . Class 3 severe obesity due to excess calories with serious comorbidity and body mass index (BMI) of 40.0 to 44.9 in adult [facility]) 03/08/2018    Conditions to be addressed/monitored: HTN and DMII; Limited access to caregiver  Care Plan : Social Work Care Plan  Updates made by Bevelyn Ngo since 05/09/2020 12:00 AM    Problem: Care Coordination     Long-Range Goal: Collaborate with RN Care Manager to perform appropriate assessments to assist with care coordination needs   Start Date: 02/02/2020  Expected End Date: 08/22/2020  Recent Progress: On track  Priority: Medium  Note:   Current Barriers:  . Chronic disease management support and education needs related to CHF, HTN, and DMII  . Transportation and Limited access to caregiver  Social Worker Clinical Goal(s):  [name] Kitchen Over the next 120 days, patient will work with SW to identify and address any acute and/or chronic care coordination needs related to the self health management of CHF, HTN, and DMII   CCM SW Interventions:  Current Barriers:  . 1:1 collaboration with Dorothyann Peng, MD regarding development and update of comprehensive plan of care as evidenced by provider attestation and co-signature . Inter-disciplinary care team collaboration (see longitudinal plan of care) . Inbound call received from Marye Round with [facility] in home aid program . Discussed the patient started receiving an aid in the home in October. Unfortunately, in December the patient requested an agency switch. Since this switch she has refused aids that have been sent to her home based on different reasons such as age or vaccination status . Informed the patient has not received  services in her home since November of 2021 due to refusing to work with assigned aids . Determined the patient is set to receive an aid in the month of March. If the patient refuses to work with this aid she will be removed from the program. The patient can go back on the waiting list if she is removed which is approximately 18 months or greater . Performed chart review to note patients referral to [facility] on Wheels program was accepted and patient has been placed on that waiting list . Collaboration with Delsa Sale RN Care Manager to provide an update on status of goal . SW will follow up with the patient as scheduled on 3.10.22   Patient Goals/Self-Care Activities . Over the next 90 days, patient will:   - Attend all scheduled provider appointments -Call pharmacy for medication refills -Call provider office for new concerns or questions -Contact SW as needed prior to next scheduled call  Follow Up Plan: SW will follow up with the patient over the next 10 days       Follow Up Plan: SW will follow up with the patient  on 3.10.22      Bevelyn Ngo, BSW, CDP Social Worker, Certified Dementia Practitioner [facility] Care Management [phone]  Total time spent performing care coordination and/or care management activities with the patient by phone or face to face = 22 minutes.

## 2020-05-10 ENCOUNTER — Telehealth: Payer: Self-pay

## 2020-05-10 ENCOUNTER — Telehealth: Payer: Medicare Other

## 2020-05-10 NOTE — Telephone Encounter (Signed)
  Chronic Care Management   Outreach Note  05/10/2020 Name: Helen Richardson MRN: [id number] DOB: Oct 20, 1933  Referred by: Dorothyann Peng, MD Reason for referral : Chronic Care Management   An unsuccessful telephone outreach was attempted today. The patient was referred to the case management team for assistance with care management and care coordination.   Follow Up Plan: A HIPAA compliant phone message was left for the patient providing contact information and requesting a return call.  The care management team will reach out to the patient again over the next 10 days.   Bevelyn Ngo, BSW, CDP Social Worker, Certified Dementia Practitioner [facility] Care Management [phone]

## 2020-05-11 ENCOUNTER — Telehealth: Payer: Medicare Other

## 2020-05-14 ENCOUNTER — Ambulatory Visit: Payer: Medicare Other

## 2020-05-14 ENCOUNTER — Other Ambulatory Visit: Payer: Self-pay

## 2020-05-14 ENCOUNTER — Other Ambulatory Visit: Payer: Medicare PPO | Admitting: Hospice

## 2020-05-14 DIAGNOSIS — Z794 Long term (current) use of insulin: Secondary | ICD-10-CM | POA: Diagnosis not present

## 2020-05-14 DIAGNOSIS — Z515 Encounter for palliative care: Secondary | ICD-10-CM | POA: Diagnosis not present

## 2020-05-14 DIAGNOSIS — E1122 Type 2 diabetes mellitus with diabetic chronic kidney disease: Secondary | ICD-10-CM | POA: Diagnosis not present

## 2020-05-14 DIAGNOSIS — I10 Essential (primary) hypertension: Secondary | ICD-10-CM

## 2020-05-14 DIAGNOSIS — N183 Chronic kidney disease, stage 3 unspecified: Secondary | ICD-10-CM | POA: Diagnosis not present

## 2020-05-14 NOTE — Progress Notes (Signed)
Therapist, nutritional Palliative Care Consult Note Telephone: [phone]  Fax: [phone]  PATIENT NAME: Helen Richardson DOB: [birth year] MRN: [id number]  PRIMARY CARE PROVIDER:   Dorothyann Peng, MD Dorothyann Peng, MD [address] [place],  [place] [address]  REFERRING PROVIDER: Dorothyann Peng, MD Dorothyann Peng, MD [address] [place],  [place] [address]   RESPONSIBLE PARTY:Self [phone] Contact:Brandon Ranlo, grandsonat [phone] Lessie Dings, caregiver [phone]- close family friend [name] [phone] 3152-caregiver  TELEHEALTH VISIT STATEMENT Due to the COVID-19 crisis, this visit was done via telephone from my office. It was initiated and consented to by this patient and/or family.   Visit is to build trust and highlight Palliative Medicine as specialized medical care for people living with serious illness, aimed at facilitating better quality of life through symptoms relief, assisting with advance care plan and establishing goals of care.   CHIEF COMPLAINT: Palliative follow up visit/Hypertension  RECOMMENDATIONS/PLAN:   1. Advance Care Planning/Code Status:  2. Goals of Care: Goals of care include to maximize quality of life and symptom management.  Visit consisted of counseling and education dealing with the complex and emotionally intense issues of symptom management and palliative care in the setting of serious and potentially life-threatening illness. Palliative care team will continue to support patient, patient's family, and medical team.  3. Symptom management/Plan:  Hypertension managed with Carvedilol and Telmisartan. Continue with medications as ordered; check BP regularly and report chest pain, palpitations, dizziness, headaches.  Cardiologist consult as needed. Palliative will continue to monitor for symptom management/decline and make recommendations as needed. Return 2 months or prn. Encouraged  to call provider sooner with any concerns.    HISTORY OF PRESENT ILLNESS: HISTORY OF PRESENT ILLNESS:Helen Richardson a 87 y.o.year oldfemalewith multiple medical problems including DMT2, CHF, HTN. Palliative Care was asked to help address complex decision making in the context of  goals of care/advance care planning.  Follow-up visit to review hypertension, in the context of congestive heart failure and type 2 diabetes mellitus.  Uncontrolled hypertension impairs her activities of daily living but she reports  adherence to prescribed medications and disease is helpful; quality of life enhanced by controlled hypertension.  She denies headaches, dizziness, palpitations, chest pain.  Patient with no acute concerns.  Hypertension is chronic currently well managed with medication and lifestyle changes.    History obtained from review of EMR, discussion with patient/family.   Review and summarization of Epic records shows history from other than patient. Rest of 10 point ROS asked and negative.  Palliative Care was asked to follow this patient by consultation request of Dorothyann Peng, MD to help address complex decision making in the context of advance care planning and goals of care clarification.   CODE STATUS: Full  PPS: 50%  HOSPICE ELIGIBILITY/DIAGNOSIS: TBD  PAST MEDICAL HISTORY:  Past Medical History:  Diagnosis Date  . CHF (congestive heart failure) (HCC)   . Diabetes mellitus without complication (HCC)   . High cholesterol   . HTN (hypertension)   . Obesity     SOCIAL HX: @SOCX  Patient at home for ongoing care   FAMILY HX:  Family History  Problem Relation Age of Onset  . Diabetes Mother   . Hypertension Mother   . Hypertension Father   . Stroke Father     Review lab tests/diagnostics No results for input(s): WBC, HGB, HCT, PLT, MCV in the last 168 hours. No results for input(s): NA, K,  CL, CO2, BUN, CREATININE, GLUCOSE in the last 168 hours. Latest GFR by  Cockcroft Gault (not valid in AKI or ESRD) CrCl cannot be calculated (Patient's most recent lab result is older than the maximum 21 days allowed.). No results for input(s): AST, ALT, ALKPHOS, GGT in the last 168 hours.  Invalid input(s): TBILI, CONJBILI, ALB, TOTALPROTEIN No components found for: ALB No results for input(s): APTT, INR in the last 168 hours.  Invalid input(s): PTPATIENT No results for input(s): BNP, PROBNP in the last 168 hours.  ALLERGIES:  Allergies  Allergen Reactions  . Tradjenta [Linagliptin] Swelling      PERTINENT MEDICATIONS:  Outpatient Encounter Medications as of 05/14/2020  Medication Sig  . Alcohol Swabs (ALCOHOL PREP) 70 % PADS   . aspirin 81 MG chewable tablet Chew 81 mg by mouth at bedtime.  . Calcium Carbonate-Vitamin D (CALTRATE 600+D PO) Take 1 tablet by mouth daily.  . carvedilol (COREG) 6.25 MG tablet TAKE 1 TABLET BY MOUTH TWICE DAILY  . diclofenac sodium (VOLTAREN) 1 % GEL APPLY TO AFFECTED AREA 3 TIMES DAILY AS NEEDED (Patient taking differently: Apply 2 g topically 3 (three) times daily as needed (pain).)  . furosemide (LASIX) 40 MG tablet TAKE 1 TABLET(40 MG) BY MOUTH DAILY  . glucose blood test strip Test up to 4 times daily  . Insulin Pen Needle 32G X 4 MM MISC Use with insulin Dx code e11.65  . nystatin (NYSTATIN) powder Apply 1 application topically 3 (three) times daily.  . polyethylene glycol powder (GLYCOLAX/MIRALAX) 17 GM/SCOOP powder polyethylene glycol 3350 17 gram/dose oral powder  MIX 1 CAPFUL IN DRINK AND TK PO 1 TO 3 TIMES DAILY PRF DAILY SOFT STOOLS  . potassium chloride SA (KLOR-CON) 20 MEQ tablet Take 1 tablet (20 mEq total) by mouth once for 1 dose. Please d/c 2 rx for 2 times per day thanks  . rosuvastatin (CRESTOR) 10 MG tablet TAKE 1 TABLET BY MOUTH MONDAY-FRIDAY. DO NOT TAKE ON SATURDAY OR SUNDAY  . rosuvastatin (CRESTOR) 20 MG tablet Take 1 tablet (20 mg total) by mouth daily.  [name] Kitchen telmisartan (MICARDIS) 80 MG tablet TAKE  1 TABLET(80 MG) BY MOUTH DAILY  . TRADJENTA 5 MG TABS tablet TK 1 T PO QD  . TRESIBA FLEXTOUCH 200 UNIT/ML FlexTouch Pen INJECT 36 UNITS UNDER THE SKIN DAILY. MAX TITRATION 80 UNITS  . UNABLE TO FIND lancets 30 gauge  . UNABLE TO FIND Comfort EZ Pen Needles 32 gauge x 5/32"   No facility-administered encounter medications on file as of 05/14/2020.    ROS  General: NAD EYES: denies vision changes ENMT: denies dysphagia no xerostomia Cardiovascular: denies chest pain Pulmonary: denies  cough, denies SOB  Abdomen: endorses fair appetite, no constipation or diarrhea GU: denies dysuria or urinary frequency MSK:  endorses ROM limitations, no falls reported Skin: denies rashes or wounds Neurological: endorses weakness, denies pain, denies insomnia or dizziness Psych: Endorses positive mood Heme/lymph/immuno: denies bruises, abnormal bleeding  Physical exam deferred due to telehealth medicine.  Thank you for the opportunity to participate in the care of VITTORIA NOREEN Please call our office at [phone] if we can be of additional assistance.  Note: Portions of this note were generated with Scientist, clinical (histocompatibility and immunogenetics). Dictation errors may occur despite best attempts at proofreading.  Rosaura Carpenter, NP

## 2020-05-14 NOTE — Patient Instructions (Signed)
Social Worker Visit Information  Goals we discussed today:  Goals Addressed            This Visit's Progress   . Work with SW to manage care coordination needs       Timeframe:  Long-Range Goal Priority:  [employer] Start Date:  12.2.21                           Expected End Date:   6.22.22                    Next planned outreach: 3.16.22  Patient Goals/Self-Care Activities . Over the next 60 days, patient will:   - Attend all scheduled provider appointments -Call pharmacy for medication refills -Call provider office for new concerns or questions -Contact SW as needed prior to next scheduled call

## 2020-05-14 NOTE — Chronic Care Management (AMB) (Signed)
Chronic Care Management    Social Work Note  05/14/2020 Name: Helen Richardson MRN: [id number] DOB: [birth year]  Helen Richardson is a 87 y.o. year old female who is a primary care patient of Dorothyann Peng, MD. The CCM team was consulted to assist the patient with chronic disease management and/or care coordination needs related to: Level of Care Concerns.   Engaged with patient by telephone for follow up visit in response to provider referral for social work chronic care management and care coordination services.   Consent to Services:  The patient was given information about Chronic Care Management services, agreed to services, and gave verbal consent prior to initiation of services.  Please see initial visit note for detailed documentation.   Patient agreed to services and consent obtained.   Assessment: Review of patient past medical history, allergies, medications, and health status, including review of relevant consultants reports was performed today as part of a comprehensive evaluation and provision of chronic care management and care coordination services.     SDOH (Social Determinants of Health) assessments and interventions performed:    Advanced Directives Status: Not addressed in this encounter.  CCM Care Plan  Allergies  Allergen Reactions  . Tradjenta [Linagliptin] Swelling    Outpatient Encounter Medications as of 05/14/2020  Medication Sig Note  . Alcohol Swabs (ALCOHOL PREP) 70 % PADS  03/10/2013: Received from: External Pharmacy  . aspirin 81 MG chewable tablet Chew 81 mg by mouth at bedtime.   . Calcium Carbonate-Vitamin D (CALTRATE 600+D PO) Take 1 tablet by mouth daily.   . carvedilol (COREG) 6.25 MG tablet TAKE 1 TABLET BY MOUTH TWICE DAILY   . diclofenac sodium (VOLTAREN) 1 % GEL APPLY TO AFFECTED AREA 3 TIMES DAILY AS NEEDED (Patient taking differently: Apply 2 g topically 3 (three) times daily as needed (pain).)   . furosemide (LASIX) 40 MG tablet TAKE 1  TABLET(40 MG) BY MOUTH DAILY   . glucose blood test strip Test up to 4 times daily   . Insulin Pen Needle 32G X 4 MM MISC Use with insulin Dx code e11.65   . nystatin (NYSTATIN) powder Apply 1 application topically 3 (three) times daily.   . polyethylene glycol powder (GLYCOLAX/MIRALAX) 17 GM/SCOOP powder polyethylene glycol 3350 17 gram/dose oral powder  MIX 1 CAPFUL IN DRINK AND TK PO 1 TO 3 TIMES DAILY PRF DAILY SOFT STOOLS   . potassium chloride SA (KLOR-CON) 20 MEQ tablet Take 1 tablet (20 mEq total) by mouth once for 1 dose. Please d/c 2 rx for 2 times per day thanks   . rosuvastatin (CRESTOR) 10 MG tablet TAKE 1 TABLET BY MOUTH MONDAY-FRIDAY. DO NOT TAKE ON SATURDAY OR SUNDAY   . rosuvastatin (CRESTOR) 20 MG tablet Take 1 tablet (20 mg total) by mouth daily.   [name] Kitchen telmisartan (MICARDIS) 80 MG tablet TAKE 1 TABLET(80 MG) BY MOUTH DAILY   . TRADJENTA 5 MG TABS tablet TK 1 T PO QD   . TRESIBA FLEXTOUCH 200 UNIT/ML FlexTouch Pen INJECT 36 UNITS UNDER THE SKIN DAILY. MAX TITRATION 80 UNITS   . UNABLE TO FIND lancets 30 gauge   . UNABLE TO FIND Comfort EZ Pen Needles 32 gauge x 5/32"    No facility-administered encounter medications on file as of 05/14/2020.    Patient Active Problem List   Diagnosis Date Noted  . Controlled type 2 diabetes mellitus with stable proliferative retinopathy of both eyes, with long-term current use of insulin (HCC) 07/04/2019  .  Intermediate stage nonexudative age-related macular degeneration of both eyes 07/04/2019  . Posterior vitreous detachment of right eye 07/04/2019  . Degenerative retinal drusen of both eyes 07/04/2019  . Chorioretinal scar 07/04/2019  . Pincer nail deformity 04/12/2019  . AKI (acute kidney injury) (HCC) 06/11/2018  . Sepsis due to gram-negative UTI (HCC) 06/11/2018  . Acute lower UTI (urinary tract infection) 06/10/2018  . HTN (hypertension) 06/10/2018  . Joint swelling 06/10/2018  . Hyperkalemia 06/10/2018  . Hyponatremia 06/10/2018   . Normocytic anemia 06/10/2018  . Type 2 diabetes mellitus with stage 3 chronic kidney disease, with long-term current use of insulin (HCC) 03/08/2018  . 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 Pure hypercholesterolemia 03/08/2018  . Class 3 severe obesity due to excess calories with serious comorbidity and body mass index (BMI) of 40.0 to 44.9 in adult [facility]) 03/08/2018    Conditions to be addressed/monitored: CHF, HTN and DMII; Limited access to caregiver  Care Plan : Social Work Care Plan  Updates made by Bevelyn Ngo since 05/14/2020 12:00 AM    Problem: Care Coordination     Long-Range Goal: Collaborate with RN Care Manager to perform appropriate assessments to assist with care coordination needs   Start Date: 02/02/2020  Expected End Date: 08/22/2020  Recent Progress: On track  Priority: Medium  Note:   Current Barriers:  . Chronic disease management support and education needs related to CHF, HTN, and DMII  . Transportation and Limited access to caregiver  Social Worker Clinical Goal(s):  [name] Kitchen Over the next 120 days, patient will work with SW to identify and address any acute and/or chronic care coordination needs related to the self health management of CHF, HTN, and DMII   CCM SW Interventions:  Current Barriers:  . 1:1 collaboration with Dorothyann Peng, MD regarding development and update of comprehensive plan of care as evidenced by provider attestation and co-signature . Inter-disciplinary care team collaboration (see longitudinal plan of care) . Successful outbound call placed to the patient to assist with care coordination needs . Advised the patient she has been added to the wait list for mobile meals . Discussed the patient is eligible to receive a caregiver from [facility] in home aid program and began receiving a care giver last week . Advised the patient this SW was informed if she refuses care from a caregiver or requests another change she will be removed  from the program - the patient reports she does not remember refusing a caregiver in the past . Attempted to identify a family member SW could contact to discuss this with - patient reported "there isn't anyone" . SW re-assessed for help in the home to assist with meal preparation - patient reported she has a hired caregiver Monday - Friday in the morning but no one to assist weekends or in the evenings . SW reminded the patient she previously told SW that her family alternates who comes into the home - patient indicated this is not happening consistently. However, she later indicated her grandson comes almost daily to check on her . Collaboration with RN Care Manager to discuss outcome of call and plan - SW will attempt to contact the patient when her morning caregiver is present to determine level of help needed in the home . During today's call the patient indicated she it out of medication - sent an urgent in basket message to [facility] with embedded pharmacy team requesting an update on delivery status   Patient Goals/Self-Care Activities . Over the  next 30 days, patient will:   - Attend all scheduled provider appointments -Call pharmacy for medication refills -Call provider office for new concerns or questions -Contact SW as needed prior to next scheduled call  Follow Up Plan: SW will follow up with the patient over the next 2 days       Follow Up Plan: SW will follow up with patient by phone over the next two days.      Bevelyn Ngo, BSW, CDP Social Worker, Certified Dementia Practitioner [facility] Care Management [phone]  Total time spent performing care coordination and/or care management activities with the patient by phone or face to face = 39 minutes.

## 2020-05-16 ENCOUNTER — Telehealth: Payer: Medicare Other

## 2020-05-16 ENCOUNTER — Telehealth: Payer: Self-pay

## 2020-05-16 NOTE — Telephone Encounter (Signed)
  Chronic Care Management   Outreach Note  05/16/2020 Name: Helen Richardson MRN: [id number] DOB: 22-Jul-1933  Referred by: Dorothyann Peng, MD Reason for referral : Chronic Care Management   An unsuccessful telephone outreach was attempted today. The patient was referred to the case management team for assistance with care management and care coordination.   Follow Up Plan: A HIPAA compliant phone message was left for the patient providing contact information and requesting a return call.  The care management team will reach out to the patient again over the next 21 days.   Bevelyn Ngo, BSW, CDP Social Worker, Certified Dementia Practitioner [facility] Care Management [phone]

## 2020-05-20 ENCOUNTER — Emergency Department (HOSPITAL_COMMUNITY): Payer: Medicare Other

## 2020-05-20 ENCOUNTER — Other Ambulatory Visit: Payer: Self-pay

## 2020-05-20 ENCOUNTER — Emergency Department (HOSPITAL_COMMUNITY)
Admission: EM | Admit: 2020-05-20 | Discharge: 2020-05-21 | Disposition: A | Discharge status: Home / Self Care | Payer: Medicare Other | Attending: Emergency Medicine | Admitting: Emergency Medicine

## 2020-05-20 DIAGNOSIS — Z7982 Long term (current) use of aspirin: Secondary | ICD-10-CM | POA: Insufficient documentation

## 2020-05-20 DIAGNOSIS — I509 Heart failure, unspecified: Secondary | ICD-10-CM | POA: Diagnosis not present

## 2020-05-20 DIAGNOSIS — Z79899 Other long term (current) drug therapy: Secondary | ICD-10-CM | POA: Diagnosis not present

## 2020-05-20 DIAGNOSIS — N183 Chronic kidney disease, stage 3 unspecified: Secondary | ICD-10-CM | POA: Insufficient documentation

## 2020-05-20 DIAGNOSIS — I700 Atherosclerosis of aorta: Secondary | ICD-10-CM | POA: Diagnosis not present

## 2020-05-20 DIAGNOSIS — R404 Transient alteration of awareness: Secondary | ICD-10-CM | POA: Diagnosis not present

## 2020-05-20 DIAGNOSIS — R600 Localized edema: Secondary | ICD-10-CM | POA: Diagnosis not present

## 2020-05-20 DIAGNOSIS — I130 Hypertensive heart and chronic kidney disease with heart failure and stage 1 through stage 4 chronic kidney disease, or unspecified chronic kidney disease: Secondary | ICD-10-CM | POA: Diagnosis not present

## 2020-05-20 DIAGNOSIS — Z743 Need for continuous supervision: Secondary | ICD-10-CM | POA: Diagnosis not present

## 2020-05-20 DIAGNOSIS — M47814 Spondylosis without myelopathy or radiculopathy, thoracic region: Secondary | ICD-10-CM | POA: Diagnosis not present

## 2020-05-20 DIAGNOSIS — R0902 Hypoxemia: Secondary | ICD-10-CM | POA: Diagnosis not present

## 2020-05-20 DIAGNOSIS — R6889 Other general symptoms and signs: Secondary | ICD-10-CM | POA: Diagnosis not present

## 2020-05-20 DIAGNOSIS — I951 Orthostatic hypotension: Secondary | ICD-10-CM

## 2020-05-20 DIAGNOSIS — E1122 Type 2 diabetes mellitus with diabetic chronic kidney disease: Secondary | ICD-10-CM | POA: Insufficient documentation

## 2020-05-20 DIAGNOSIS — I499 Cardiac arrhythmia, unspecified: Secondary | ICD-10-CM | POA: Diagnosis not present

## 2020-05-20 DIAGNOSIS — Z794 Long term (current) use of insulin: Secondary | ICD-10-CM | POA: Diagnosis not present

## 2020-05-20 DIAGNOSIS — R55 Syncope and collapse: Secondary | ICD-10-CM | POA: Diagnosis not present

## 2020-05-20 LAB — COMPREHENSIVE METABOLIC PANEL
ALT: 12 U/L (ref 0–44)
AST: 15 U/L (ref 15–41)
Albumin: 3.4 g/dL — ABNORMAL LOW (ref 3.5–5.0)
Alkaline Phosphatase: 79 U/L (ref 38–126)
Anion gap: 8 (ref 5–15)
BUN: 14 mg/dL (ref 8–23)
CO2: 27 mmol/L (ref 22–32)
Calcium: 9.6 mg/dL (ref 8.9–10.3)
Chloride: 101 mmol/L (ref 98–111)
Creatinine, Ser: 1.04 mg/dL — ABNORMAL HIGH (ref 0.44–1.00)
GFR, Estimated: 53 mL/min — ABNORMAL LOW (ref >60)
Glucose, Bld: 233 mg/dL — ABNORMAL HIGH (ref 70–99)
Potassium: 4.6 mmol/L (ref 3.5–5.1)
Sodium: 136 mmol/L (ref 135–145)
Total Bilirubin: 0.6 mg/dL (ref 0.3–1.2)
Total Protein: 6.8 g/dL (ref 6.5–8.1)

## 2020-05-20 LAB — CBC WITH DIFFERENTIAL/PLATELET
Abs Immature Granulocytes: 0.04 10*3/uL (ref 0.00–0.07)
Basophils Absolute: 0 10*3/uL (ref 0.0–0.1)
Basophils Relative: 1 %
Eosinophils Absolute: 0.2 10*3/uL (ref 0.0–0.5)
Eosinophils Relative: 3 %
HCT: 42 % (ref 36.0–46.0)
Hemoglobin: 13.3 g/dL (ref 12.0–15.0)
Immature Granulocytes: 1 %
Lymphocytes Relative: 19 %
Lymphs Abs: 1.6 10*3/uL (ref 0.7–4.0)
MCH: 28.6 pg (ref 26.0–34.0)
MCHC: 31.7 g/dL (ref 30.0–36.0)
MCV: 90.3 fL (ref 80.0–100.0)
Monocytes Absolute: 0.7 10*3/uL (ref 0.1–1.0)
Monocytes Relative: 8 %
Neutro Abs: 6.2 10*3/uL (ref 1.7–7.7)
Neutrophils Relative %: 68 %
Platelets: 253 10*3/uL (ref 150–400)
RBC: 4.65 MIL/uL (ref 3.87–5.11)
RDW: 13.6 % (ref 11.5–15.5)
WBC: 8.8 10*3/uL (ref 4.0–10.5)
nRBC: 0 % (ref 0.0–0.2)

## 2020-05-20 LAB — TROPONIN I (HIGH SENSITIVITY): Troponin I (High Sensitivity): 14 ng/L (ref <18)

## 2020-05-20 MED ORDER — LACTATED RINGERS IV BOLUS
1000.0000 mL | Freq: Once | INTRAVENOUS | Status: AC
  Administered 2020-05-20: 1000 mL via INTRAVENOUS

## 2020-05-20 NOTE — ED Triage Notes (Signed)
Pt brought to ED by [facility] via stretcher with c/o SMS. Per  EMS,  Initial call made by family member for pt being found "semi-responsive" with using restroom at home. Pt lives alone but family was unable to reach patient by phone and performed welfare check. EMS states pt has initial GCS of 14 upon arrival and became 15 in transit to facility with no recollection of what happened. Pt alert, oriented and answers most questions appropriately at time of triage. No acute distress noted.

## 2020-05-20 NOTE — ED Provider Notes (Signed)
[facility] EMERGENCY DEPARTMENT Provider Note   CSN: [id number] Arrival date & time: 05/20/20  1904     History Chief Complaint  Patient presents with  . Altered Mental Status    Helen Richardson is a 87 y.o. female.  Patient is an 87 year old female with a history of hypertension, anemia, diabetes and CHF who presents today after a syncopal event at home.  Patient reports that she had been feeling fine all day but she had only had a bowl of cereal this morning because she is still waiting for them to have care arranged on the weekends for someone to help her with meals.  She has someone there Monday through Friday but on the weekends she is on her own.  She had been talking to her family on the phone and said that she was going to go to the bathroom.  Family tried to call the patient back and she did not answer the phone so they had someone go and check on her.  When they got there she was sitting in the bathroom on the toilet and semiresponsive.  Patient does not remember any of this.  When EMS arrived patient was semiresponsive with a blood pressure of 90 palpated.  They checked her blood sugar it was 150 at the time.  Once they got her out of the bathroom and laying down and on the stretcher patient started coming around and ask why she was there and what had happened.  Blood pressure improved to 130/50.  Upon arrival here patient states that she has no idea why they called EMS.  She denies any chest pain, shortness of breath or palpitations.  She denies any recent illness or abdominal pain.  She is still been taking her medications as prescribed and has not had any new medications.  She did not have any falls during this event and denies any headaches.  No unilateral numbness or weakness.  No speech problems.  The history is provided by the patient.  Altered Mental Status Presenting symptoms: partial responsiveness        Past Medical History:  Diagnosis Date  . CHF  (congestive heart failure) (HCC)   . Diabetes mellitus without complication (HCC)   . High cholesterol   . HTN (hypertension)   . Obesity     Patient Active Problem List   Diagnosis Date Noted  . Controlled type 2 diabetes mellitus with stable proliferative retinopathy of both eyes, with long-term current use of insulin (HCC) 07/04/2019  . Intermediate stage nonexudative age-related macular degeneration of both eyes 07/04/2019  . Posterior vitreous detachment of right eye 07/04/2019  . Degenerative retinal drusen of both eyes 07/04/2019  . Chorioretinal scar 07/04/2019  . Pincer nail deformity 04/12/2019  . AKI (acute kidney injury) (HCC) 06/11/2018  . Sepsis due to gram-negative UTI (HCC) 06/11/2018  . Acute lower UTI (urinary tract infection) 06/10/2018  . HTN (hypertension) 06/10/2018  . Joint swelling 06/10/2018  . Hyperkalemia 06/10/2018  . Hyponatremia 06/10/2018  . Normocytic anemia 06/10/2018  . Type 2 diabetes mellitus with stage 3 chronic kidney disease, with long-term current use of insulin (HCC) 03/08/2018  . 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 Pure hypercholesterolemia 03/08/2018  . Class 3 severe obesity due to excess calories with serious comorbidity and body mass index (BMI) of 40.0 to 44.9 in adult [facility]) 03/08/2018    Past Surgical History:  Procedure Laterality Date  . CATARACT EXTRACTION, BILATERAL    . ingrown toenail  Left    left great toenail  . PARTIAL HYSTERECTOMY       OB History   No obstetric history on file.     Family History  Problem Relation Age of Onset  . Diabetes Mother   . Hypertension Mother   . Hypertension Father   . Stroke Father     Social History   Tobacco Use  . Smoking status: Never Smoker  . Smokeless tobacco: Never Used  Vaping Use  . Vaping Use: Never used  Substance Use Topics  . Alcohol use: No  . Drug use: No    Home Medications Prior to Admission medications   Medication Sig Start Date End Date  Taking? Authorizing Provider  Alcohol Swabs (ALCOHOL PREP) 70 % PADS  02/09/13   [provider]  aspirin 81 MG chewable tablet Chew 81 mg by mouth at bedtime.    [provider]  Calcium Carbonate-Vitamin D (CALTRATE 600+D PO) Take 1 tablet by mouth daily.    [provider]  carvedilol (COREG) 6.25 MG tablet TAKE 1 TABLET BY MOUTH TWICE DAILY 11/02/19   Dorothyann Peng, MD  diclofenac sodium (VOLTAREN) 1 % GEL APPLY TO AFFECTED AREA 3 TIMES DAILY AS NEEDED Patient taking differently: Apply 2 g topically 3 (three) times daily as needed (pain). 06/16/18   Dorothyann Peng, MD  furosemide (LASIX) 40 MG tablet TAKE 1 TABLET(40 MG) BY MOUTH DAILY 02/06/20   Dorothyann Peng, MD  glucose blood test strip Test up to 4 times daily 09/17/18   Dorothyann Peng, MD  Insulin Pen Needle 32G X 4 MM MISC Use with insulin Dx code e11.65 12/07/19   Dorothyann Peng, MD  nystatin (NYSTATIN) powder Apply 1 application topically 3 (three) times daily. 03/14/20   Ghumman, Ramandeep, NP  polyethylene glycol powder (GLYCOLAX/MIRALAX) 17 GM/SCOOP powder polyethylene glycol 3350 17 gram/dose oral powder  MIX 1 CAPFUL IN DRINK AND TK PO 1 TO 3 TIMES DAILY PRF DAILY SOFT STOOLS 05/14/18   [provider]  potassium chloride SA (KLOR-CON) 20 MEQ tablet Take 1 tablet (20 mEq total) by mouth once for 1 dose. Please d/c 2 rx for 2 times per day thanks 04/12/20 04/12/20  [name], NP  rosuvastatin (CRESTOR) 10 MG tablet TAKE 1 TABLET BY MOUTH MONDAY-FRIDAY. DO NOT TAKE ON SATURDAY OR SUNDAY 09/22/19   Dorothyann Peng, MD  rosuvastatin (CRESTOR) 20 MG tablet Take 1 tablet (20 mg total) by mouth daily. 02/06/20   Dorothyann Peng, MD  telmisartan (MICARDIS) 80 MG tablet TAKE 1 TABLET(80 MG) BY MOUTH DAILY 02/06/20   Dorothyann Peng, MD  TRADJENTA 5 MG TABS tablet TK 1 T PO QD 10/11/18   [provider]  TRESIBA FLEXTOUCH 200 UNIT/ML FlexTouch Pen INJECT 36 UNITS UNDER THE SKIN DAILY. MAX TITRATION 80  UNITS 01/12/20   Dorothyann Peng, MD  UNABLE TO FIND lancets 30 gauge    [provider]  UNABLE TO FIND Comfort EZ Pen Needles 32 gauge x 5/32"    [provider]    Allergies    Tradjenta [linagliptin]  Review of Systems   Review of Systems  All other systems reviewed and are negative.   Physical Exam Updated Vital Signs BP (!) 128/54 (BP Location: Left Wrist)   Pulse 63   Temp 97.6 F (36.4 C) (Oral)   Resp 15   Ht 5\' 3"  (1.6 m)   Wt 90.7 kg   SpO2 99%   BMI 35.43 kg/m   Physical Exam  Vitals and nursing note reviewed.  Constitutional:      General: She is not in acute distress.    Appearance: Normal appearance. She is well-developed.  HENT:     Head: Normocephalic and atraumatic.     Mouth/Throat:     Mouth: Mucous membranes are moist.  Eyes:     Pupils: Pupils are equal, round, and reactive to light.  Cardiovascular:     Rate and Rhythm: Normal rate and regular rhythm.     Pulses: Normal pulses.     Heart sounds: Normal heart sounds. No murmur heard. No friction rub.  Pulmonary:     Effort: Pulmonary effort is normal.     Breath sounds: Normal breath sounds. No wheezing or rales.  Abdominal:     General: Bowel sounds are normal. There is no distension.     Palpations: Abdomen is soft.     Tenderness: There is no abdominal tenderness. There is no guarding or rebound.  Musculoskeletal:        General: No tenderness. Normal range of motion.     Cervical back: Normal range of motion and neck supple.     Right lower leg: Edema present.     Left lower leg: Edema present.     Comments: Trace pitting edema bilateral lower extremities  Skin:    General: Skin is warm and dry.     Coloration: Skin is not pale.     Findings: No rash.  Neurological:     General: No focal deficit present.     Mental Status: She is alert and oriented to person, place, and time. Mental status is at baseline.     Cranial Nerves: No cranial nerve deficit.   Psychiatric:        Mood and Affect: Mood normal.        Behavior: Behavior normal.        Thought Content: Thought content normal.     ED Results / Procedures / Treatments   Labs (all labs ordered are listed, but only abnormal results are displayed) Labs Reviewed  COMPREHENSIVE METABOLIC PANEL - Abnormal; Notable for the following components:      Result Value   Glucose, Bld 233 (*)    Creatinine, Ser 1.04 (*)    Albumin 3.4 (*)    GFR, Estimated 53 (*)    All other components within normal limits  CBC WITH DIFFERENTIAL/PLATELET  TROPONIN I (HIGH SENSITIVITY)  TROPONIN I (HIGH SENSITIVITY)    EKG EKG Interpretation  Date/Time:  Sunday May 20 2020 20:25:52 EDT Ventricular Rate:  73 PR Interval:    QRS Duration: 85 QT Interval:  429 QTC Calculation: 473 R Axis:   -33 Text Interpretation: Sinus rhythm Left axis deviation Abnormal R-wave progression, early transition No significant change since last tracing Confirmed by Gwyneth Sproutlunkett, Makenize Messman ([id number]) on 05/20/2020 8:32:22 PM   Radiology DG Chest Port 1 View  Result Date: 05/20/2020 CLINICAL DATA:  Altered mental status/syncope EXAM: PORTABLE CHEST 1 VIEW COMPARISON:  07/01/2018 FINDINGS: Thoracic spondylosis. Aortic atherosclerotic calcification. Heart size within normal limits. The lungs appear clear. No blunting of the costophrenic angles. IMPRESSION: 1. No acute findings. 2. Aortic atherosclerotic calcification. Electronically Signed   By: Gaylyn RongWalter  Liebkemann M.D.   On: 05/20/2020 20:32    Procedures Procedures   Medications Ordered in ED Medications  lactated ringers bolus 1,000 mL (has no administration in time range)    ED Course  I have reviewed the triage vital signs and the nursing notes.  Pertinent labs & imaging results that were available during my care of the patient were reviewed by me and considered in my medical decision making (see chart for details).    MDM Rules/Calculators/A&P                           Patient presenting today after a near syncopal event at home.  Patient was found in the bathroom on the toilet during this event.  Patient denies any symptoms at this time and is requesting to go home.  Patient is an elderly female with a heart history but does not recall if she has ever had syncope before.  Patient's blood sugar has been normal.  Initially she was hypotensive which resolved with lying down.  She has not eaten much today because she does not have home care on the weekends.  Low suspicion for acute trauma as the cause of her issue as she has no headache or evidence of injury.  Patient does not take anticoagulation and does not appear particularly pale and denies any black stools but will check CBC to ensure no evidence of new anemia.  She denies any chest pain, palpitations or shortness of breath.  Low suspicion for PE at this time but will do EKG and troponin to ensure no acute cardiac event.  Will give patient fluid give her something to eat and continue to monitor.  11:57 PM Patient's labs are reassuring.  Delta troponin is still pending.  Orthostatics are slightly positive however patient was asymptomatic during this.  She was able to get out of bed ambulate with a walker without any hesitation.  Feel that of patient's delta troponin is negative she will be stable for discharge.  Patient has home health tomorrow and has family who can check on her.  MDM Number of Diagnoses or Management Options   Amount and/or Complexity of Data Reviewed Clinical lab tests: ordered and reviewed Tests in the radiology section of CPT: ordered and reviewed Tests in the medicine section of CPT: ordered and reviewed Independent visualization of images, tracings, or specimens: yes  Risk of Complications, Morbidity, and/or Mortality Presenting problems: moderate  Patient Progress Patient progress: stable   Final Clinical Impression(s) / ED Diagnoses Final diagnoses:  Near syncope    Rx  / DC Orders ED Discharge Orders    None       Gwyneth Sprout, MD 05/20/20 2359

## 2020-05-21 LAB — TROPONIN I (HIGH SENSITIVITY): Troponin I (High Sensitivity): 11 ng/L (ref <18)

## 2020-05-21 NOTE — ED Notes (Addendum)
This RN attempted to call every patient contact in patient's chart per pt discharge. No answer to any calls

## 2020-05-21 NOTE — ED Notes (Signed)
Pt up for discharge. States she has no one to come get her other than her grandson, [name] who lives in [place]. This nurse called all listed contacts and left voice messages as well as contacted updated number for grandson left with registration via phone earlier in the evening. Will retry shortly.

## 2020-05-21 NOTE — ED Provider Notes (Signed)
  Physical Exam  BP 127/78   Pulse 91   Temp 97.6 F (36.4 C) (Oral)   Resp 20   Ht 5\' 3"  (1.6 m)   Wt 90.7 kg   SpO2 99%   BMI 35.43 kg/m   Physical Exam  ED Course/Procedures     Procedures  MDM  Care assumed at 11 PM.  Patient had dizziness and was hypotensive.  Patient was given IV fluids and blood pressure improved.  Patient was orthostatic but she was not symptomatic from it.  I suspect orthostatic hypotension is from her Dementia.  Signed out pending second troponin.  Patient had no head injury or chest pain  12:30 AM Second troponin was negative.  Patient states that she felt better after the fluids.  Patient is mentating normally.  Stable for discharge      , MD 05/21/20 0030

## 2020-05-21 NOTE — Discharge Instructions (Signed)
Your blood pressure was low when you arrive.  You were given IV fluids  See your doctor for follow-up  Return to ER if you have another episode of passing out, dizziness, chest pain, shortness of breath

## 2020-05-21 NOTE — ED Notes (Signed)
Patient Alert and oriented to baseline. Stable and ambulatory to baseline. Patient verbalized understanding of the discharge instructions.  Patient belongings were taken by the patient.   

## 2020-05-22 ENCOUNTER — Telehealth: Payer: Self-pay

## 2020-05-22 NOTE — Telephone Encounter (Signed)
Transition Care Management Follow-up Telephone Call  Date of discharge and from where: 05/21/2020 [facility]  How have you been since you were released from the hospital? Fine  Any questions or concerns? No  Items Reviewed:  Did the pt receive and understand the discharge instructions provided? Yes  Medications obtained and verified? yes  Any new allergies since your discharge?no  Dietary orders reviewed? None given  Do you have support at home? Yes  Other (ie: DME, Home Health, etc) Needed  Functional Questionnaire: (I = Independent and D = Dependent)  Bathing/Dressing- D   Meal Prep- D  Eating-D  Maintaining continence- D  Transferring/Ambulation- D  Managing Meds- D   Follow up appointments reviewed:    PCP Hospital f/u appt confirmed? Standley Brooking, FNP-BC 05/30/2020   Specialist Hospital f/u appt confirmed?n/a  Are transportation arrangements needed? no  If their condition worsens, is the pt aware to call  their PCP or go to the ED? yes  Was the patient provided with contact information for the PCP's office or ED? Yes Was the pt encouraged to call back with questions or concerns? Yes

## 2020-05-29 DIAGNOSIS — E119 Type 2 diabetes mellitus without complications: Secondary | ICD-10-CM | POA: Diagnosis not present

## 2020-05-30 ENCOUNTER — Other Ambulatory Visit: Payer: Self-pay

## 2020-05-30 ENCOUNTER — Ambulatory Visit (INDEPENDENT_AMBULATORY_CARE_PROVIDER_SITE_OTHER): Payer: Medicare Other | Admitting: Nurse Practitioner

## 2020-05-30 ENCOUNTER — Ambulatory Visit: Payer: Medicare Other

## 2020-05-30 VITALS — BP 132/74 | HR 52 | Temp 98.2°F | Ht 63.0 in | Wt 202.8 lb

## 2020-05-30 DIAGNOSIS — N183 Chronic kidney disease, stage 3 unspecified: Secondary | ICD-10-CM

## 2020-05-30 DIAGNOSIS — E1122 Type 2 diabetes mellitus with diabetic chronic kidney disease: Secondary | ICD-10-CM | POA: Diagnosis not present

## 2020-05-30 DIAGNOSIS — Z794 Long term (current) use of insulin: Secondary | ICD-10-CM

## 2020-05-30 DIAGNOSIS — R55 Syncope and collapse: Secondary | ICD-10-CM

## 2020-05-30 DIAGNOSIS — I10 Essential (primary) hypertension: Secondary | ICD-10-CM

## 2020-05-30 NOTE — Chronic Care Management (AMB) (Signed)
Chronic Care Management    Social Work Note  05/30/2020 Name: Helen Richardson MRN: [id number] DOB: 1933/12/28  Helen Richardson is a 87 y.o. year old female who is a primary care patient of Dorothyann Peng, MD. The CCM team was consulted to assist the patient with chronic disease management and/or care coordination needs related to: [name]  and [employer].   Engaged with patient caregiver Helen Richardson by phone for follow up visit in response to provider referral for social work chronic care management and care coordination services.   Consent to Services:  The patient was given information about Chronic Care Management services, agreed to services, and gave verbal consent prior to initiation of services.  Please see initial visit note for detailed documentation.   Patient agreed to services and consent obtained.   Assessment: Review of patient past medical history, allergies, medications, and health status, including review of relevant consultants reports was performed today as part of a comprehensive evaluation and provision of chronic care management and care coordination services.     SDOH (Social Determinants of Health) assessments and interventions performed:    Advanced Directives Status: Not addressed in this encounter.  CCM Care Plan  Allergies  Allergen Reactions  . Tradjenta [Linagliptin] Swelling    Outpatient Encounter Medications as of 05/30/2020  Medication Sig Note  . Alcohol Swabs (ALCOHOL PREP) 70 % PADS  03/10/2013: Received from: External Pharmacy  . aspirin 81 MG chewable tablet Chew 81 mg by mouth at bedtime.   . Calcium Carbonate-Vitamin D (CALTRATE 600+D PO) Take 1 tablet by mouth daily.   . carvedilol (COREG) 6.25 MG tablet TAKE 1 TABLET BY MOUTH TWICE DAILY (Patient taking differently: Take 6.25 mg by mouth 2 (two) times daily with a meal.)   . diclofenac sodium (VOLTAREN) 1 % GEL APPLY TO AFFECTED AREA 3 TIMES DAILY AS NEEDED (Patient taking differently:  Apply 2 g topically 3 (three) times daily as needed (pain).)   . furosemide (LASIX) 40 MG tablet TAKE 1 TABLET(40 MG) BY MOUTH DAILY   . glucose blood test strip Test up to 4 times daily   . Insulin Pen Needle 32G X 4 MM MISC Use with insulin Dx code e11.65   . nystatin (NYSTATIN) powder Apply 1 application topically 3 (three) times daily. (Patient not taking: Reported on 05/22/2020)   . polyethylene glycol powder (GLYCOLAX/MIRALAX) 17 GM/SCOOP powder polyethylene glycol 3350 17 gram/dose oral powder  MIX 1 CAPFUL IN DRINK AND TK PO 1 TO 3 TIMES DAILY PRF DAILY SOFT STOOLS (Patient not taking: Reported on 05/22/2020)   . potassium chloride SA (KLOR-CON) 20 MEQ tablet Take 1 tablet (20 mEq total) by mouth once for 1 dose. Please d/c 2 rx for 2 times per day thanks   . rosuvastatin (CRESTOR) 10 MG tablet TAKE 1 TABLET BY MOUTH MONDAY-FRIDAY. DO NOT TAKE ON SATURDAY OR SUNDAY (Patient taking differently: Take 10 mg by mouth See admin instructions. MONDAY-FRIDAY. DO NOT TAKE ON SATURDAY OR SUNDAY)   . telmisartan (MICARDIS) 80 MG tablet TAKE 1 TABLET(80 MG) BY MOUTH DAILY (Patient taking differently: Take 80 mg by mouth daily.)   . TRADJENTA 5 MG TABS tablet Take 5 mg by mouth daily. (Patient not taking: Reported on 05/22/2020)   . TRESIBA FLEXTOUCH 200 UNIT/ML FlexTouch Pen INJECT 36 UNITS UNDER THE SKIN DAILY. MAX TITRATION 80 UNITS (Patient taking differently: Inject 36 Units into the skin daily.)   . UNABLE TO FIND lancets 30 gauge   .  UNABLE TO FIND Comfort EZ Pen Needles 32 gauge x 5/32"    No facility-administered encounter medications on file as of 05/30/2020.    Patient Active Problem List   Diagnosis Date Noted  . Controlled type 2 diabetes mellitus with stable proliferative retinopathy of both eyes, with long-term current use of insulin (HCC) 07/04/2019  . Intermediate stage nonexudative age-related macular degeneration of both eyes 07/04/2019  . Posterior vitreous detachment of right eye  07/04/2019  . Degenerative retinal drusen of both eyes 07/04/2019  . Chorioretinal scar 07/04/2019  . Pincer nail deformity 04/12/2019  . AKI (acute kidney injury) (HCC) 06/11/2018  . Sepsis due to gram-negative UTI (HCC) 06/11/2018  . Acute lower UTI (urinary tract infection) 06/10/2018  . HTN (hypertension) 06/10/2018  . Joint swelling 06/10/2018  . Hyperkalemia 06/10/2018  . Hyponatremia 06/10/2018  . Normocytic anemia 06/10/2018  . Type 2 diabetes mellitus with stage 3 chronic kidney disease, with long-term current use of insulin (HCC) 03/08/2018  . 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 Pure hypercholesterolemia 03/08/2018  . Class 3 severe obesity due to excess calories with serious comorbidity and body mass index (BMI) of 40.0 to 44.9 in adult [facility]) 03/08/2018    Conditions to be addressed/monitored: CHF, HTN and DMII; Limited access to food and Limited access to caregiver  Care Plan : Social Work Care Plan  Updates made by Bevelyn Ngo since 05/30/2020 12:00 AM    Problem: Care Coordination     Long-Range Goal: Collaborate with RN Care Manager to perform appropriate assessments to assist with care coordination needs   Start Date: 02/02/2020  Expected End Date: 08/22/2020  Recent Progress: On track  Priority: Medium  Note:   Current Barriers:  . Chronic disease management support and education needs related to CHF, HTN, and DMII  . Transportation and Limited access to caregiver  Social Worker Clinical Goal(s):  [name] Kitchen Over the next 120 days, patient will work with SW to identify and address any acute and/or chronic care coordination needs related to the self health management of CHF, HTN, and DMII   CCM SW Interventions:  Current Barriers:  . 1:1 collaboration with Dorothyann Peng, MD regarding development and update of comprehensive plan of care as evidenced by provider attestation and co-signature . Inter-disciplinary care team collaboration (see longitudinal plan of  care) . Successful outbound call placed to the patient to assist with care coordination needs- spoke with patients caregiver "[name]" . Assessed for patient care needs in the home - Helen Richardson reported she assists the patient with morning care and breakfast Monday-Friday. The patient has an afternoon caregiver four days per week provided by the in home aid program under [facility] . Determined Helen Richardson feels the patient would benefit from a weekend caregiver as well as meals on wheels . Barbie Banner the patient would have to pay out of pocket for a weekend caregiver as she is already receiving services from the in home aid program - Helen Richardson reports the patients family used to pay for a weekend caregiver but the caregiver did not work out and they have yet to hire a different person . Advised Helen Richardson SW has placed the patient on the wait list for mobile meals  . Assessed for patient ability to prepare her own meals - Helen Richardson reports the patient may be able to access snacks or heat food in the microwave . Assessed for who assists the patient on the weekends with meal preparation - Helen Richardson reports "the family does bring meals but  they have gotten very inconsistent on the weekends. I have to assume she doesn't eat on the weekends because they don't always bring her food"  . Performed chart review to note upcoming office visit scheduled for today at 3:45 pm with Arnette Felts, FNP - Helen Richardson reports the patient has knowledge of this appointment and does have transportation arranged . Barbie Banner SW would collaborate with care team members to determine if there are any resources to assist with weekend meals . Collaboration with RN Care Manager Lawanna Kobus Little to review information provided by Helen Richardson - discussed plan to request provider assess for patient needs during office visit to identify if the patient is being well care for by family members . Secure chat sent to Arnette Felts, FNP to communicate caregiver concern the patient may not be eating  meals on weekends due to lack of access. Requested provider assess for patient safety in the home during office visit  Patient Goals/Self-Care Activities . Over the next 30 days, patient will:   - Attend all scheduled provider appointments -Call pharmacy for medication refills -Call provider office for new concerns or questions -Contact SW as needed prior to next scheduled call  Follow Up Plan: SW will follow up with the patient over the next 14 days       Follow Up Plan: SW will follow up with patient by phone over the next 14 days.      Bevelyn Ngo, BSW, CDP Social Worker, Certified Dementia Practitioner [facility] [phone]  Total time spent performing care coordination and/or care management activities with the patient by phone or face to face = 38 minutes.

## 2020-05-30 NOTE — Patient Instructions (Signed)
Social Worker Visit Information  Goals we discussed today:  Goals Addressed            This Visit's Progress   . Work with SW to manage care coordination needs       Timeframe:  Long-Range Goal Priority:  [employer] Start Date:  12.2.21                           Expected End Date:   6.22.22                    Next planned outreach: 4.11.22  Patient Goals/Self-Care Activities . Over the next 60 days, patient will:   - Attend all scheduled provider appointments -Call pharmacy for medication refills -Call provider office for new concerns or questions -Contact SW as needed prior to next scheduled call        Follow Up Plan: SW will follow up with patient by phone over the next 14 days.   Bevelyn Ngo, BSW, CDP Social Worker, Certified Dementia Practitioner [facility] Management [phone]

## 2020-05-30 NOTE — Progress Notes (Signed)
Tomasa Hose as a scribe for Arnette Felts, FNP.,have documented all relevant documentation on the behalf of Arnette Felts, FNP,as directed by  Arnette Felts, FNP while in the presence of Arnette Felts, FNP. This visit occurred during the SARS-CoV-2 public health emergency.  Safety protocols were in place, including screening questions prior to the visit, additional usage of staff PPE, and extensive cleaning of exam room while observing appropriate contact time as indicated for disinfecting solutions.  Subjective:     Patient ID: Helen Richardson , female    DOB: 1934-01-27 , 87 y.o.   MRN: [id number]   Chief Complaint  Patient presents with  . Follow-up    ED follow up    HPI  Pt here today for a ED f/u after having a syncope episode.  She says she went to the bathroom and passed out. She is unclear about what happened.  Her family member "Diane" had come by.  She feels like she is doing okay. She has an Geophysicist/field seismologist to come and help her two times a day but the evening person last day is tomorrow, unsure if she will be replaced. She really does not remember.     Past Medical History:  Diagnosis Date  . CHF (congestive heart failure) (HCC)   . Diabetes mellitus without complication (HCC)   . High cholesterol   . HTN (hypertension)   . Obesity      Family History  Problem Relation Age of Onset  . Diabetes Mother   . Hypertension Mother   . Hypertension Father   . Stroke Father      Current Outpatient Medications:  .  Alcohol Swabs (ALCOHOL PREP) 70 % PADS, , Disp: , Rfl:  .  aspirin 81 MG chewable tablet, Chew 81 mg by mouth at bedtime., Disp: , Rfl:  .  Calcium Carbonate-Vitamin D (CALTRATE 600+D PO), Take 1 tablet by mouth daily., Disp: , Rfl:  .  carvedilol (COREG) 6.25 MG tablet, TAKE 1 TABLET BY MOUTH TWICE DAILY (Patient taking differently: Take 6.25 mg by mouth 2 (two) times daily with a meal.), Disp: 180 tablet, Rfl: 1 .  diclofenac sodium (VOLTAREN) 1 % GEL, APPLY  TO AFFECTED AREA 3 TIMES DAILY AS NEEDED (Patient taking differently: Apply 2 g topically 3 (three) times daily as needed (pain).), Disp: 100 g, Rfl: 1 .  furosemide (LASIX) 40 MG tablet, TAKE 1 TABLET(40 MG) BY MOUTH DAILY, Disp: 90 tablet, Rfl: 1 .  glucose blood test strip, Test up to 4 times daily, Disp: 200 each, Rfl: 12 .  Insulin Pen Needle 32G X 4 MM MISC, Use with insulin Dx code e11.65, Disp: 100 each, Rfl: 3 .  nystatin (NYSTATIN) powder, Apply 1 application topically 3 (three) times daily., Disp: 15 g, Rfl: 0 .  polyethylene glycol powder (GLYCOLAX/MIRALAX) 17 GM/SCOOP powder, , Disp: , Rfl:  .  rosuvastatin (CRESTOR) 10 MG tablet, TAKE 1 TABLET BY MOUTH MONDAY-FRIDAY. DO NOT TAKE ON SATURDAY OR SUNDAY (Patient taking differently: Take 10 mg by mouth See admin instructions. MONDAY-FRIDAY. DO NOT TAKE ON SATURDAY OR SUNDAY), Disp: 90 tablet, Rfl: 1 .  telmisartan (MICARDIS) 80 MG tablet, TAKE 1 TABLET(80 MG) BY MOUTH DAILY (Patient taking differently: Take 80 mg by mouth daily.), Disp: 90 tablet, Rfl: 1 .  TRADJENTA 5 MG TABS tablet, Take 5 mg by mouth daily., Disp: , Rfl:  .  TRESIBA FLEXTOUCH 200 UNIT/ML FlexTouch Pen, INJECT 36 UNITS UNDER THE SKIN DAILY. MAX TITRATION 80 UNITS (  Patient taking differently: Inject 36 Units into the skin daily.), Disp: 45 mL, Rfl: 1 .  UNABLE TO FIND, lancets 30 gauge, Disp: , Rfl:  .  UNABLE TO FIND, Comfort EZ Pen Needles 32 gauge x 5/32", Disp: , Rfl:  .  potassium chloride SA (KLOR-CON) 20 MEQ tablet, Take 1 tablet (20 mEq total) by mouth once for 1 dose. Please d/c 2 rx for 2 times per day thanks, Disp: 90 tablet, Rfl: 1   Allergies  Allergen Reactions  . Tradjenta [Linagliptin] Swelling     Review of Systems  Constitutional: Negative.  Negative for fatigue.  HENT: Negative.   Endocrine: Negative for polydipsia, polyphagia and polyuria.  Musculoskeletal: Negative.   Skin: Negative.   Neurological: Negative for dizziness and headaches.   Psychiatric/Behavioral: Negative.      Today's Vitals   05/30/20 1634  BP: 132/74  Pulse: (!) 52  Temp: 98.2 F (36.8 C)  Weight: 202 lb 12.8 oz (92 kg)  Height: 5\' 3"  (1.6 m)   Body mass index is 35.92 kg/m.  Wt Readings from Last 3 Encounters:  05/30/20 202 lb 12.8 oz (92 kg)  05/20/20 200 lb (90.7 kg)  04/04/20 203 lb 6.4 oz (92.3 kg)   Objective:  Physical Exam Vitals reviewed.  Constitutional:      General: She is not in acute distress.    Appearance: Normal appearance. She is obese.  HENT:     Head: Normocephalic.     Right Ear: Tympanic membrane, ear canal and external ear normal. There is no impacted cerumen.     Left Ear: Tympanic membrane, ear canal and external ear normal. There is no impacted cerumen.     Nose: Nose normal. No congestion.     Mouth/Throat:     Mouth: Mucous membranes are moist.  Eyes:     Extraocular Movements: Extraocular movements intact.     Conjunctiva/sclera: Conjunctivae normal.     Pupils: Pupils are equal, round, and reactive to light.  Cardiovascular:     Rate and Rhythm: Normal rate and regular rhythm.     Pulses: Normal pulses.     Heart sounds: Normal heart sounds. No murmur heard.   Pulmonary:     Effort: Pulmonary effort is normal. No respiratory distress.     Breath sounds: Normal breath sounds. No wheezing.  Skin:    General: Skin is warm and dry.     Capillary Refill: Capillary refill takes less than 2 seconds.  Neurological:     General: No focal deficit present.     Mental Status: She is alert and oriented to person, place, and time.     Cranial Nerves: No cranial nerve deficit.  Psychiatric:        Mood and Affect: Mood normal.        Behavior: Behavior normal.        Thought Content: Thought content normal.        Judgment: Judgment normal.         Assessment And Plan:     1. Type 2 diabetes mellitus with stage 3 chronic kidney disease, with long-term current use of insulin, unspecified whether stage 3a  or 3b CKD (HCC)  Chronic,poorly controlled was up to 12.6 at her last visit  Continue with current medications  Encouraged to limit intake of sugary foods and drinks - Hemoglobin A1c  2. Syncope, unspecified syncope type  She was seen in the ER for a syncopal episode but she does not remember  what happened. She was given IV fluids and discharged back home.     Patient was given opportunity to ask questions. Patient verbalized understanding of the plan and was able to repeat key elements of the plan. All questions were answered to their satisfaction.  Arnette Felts, FNP   I, Arnette Felts, FNP, have reviewed all documentation for this visit. The documentation on 06/06/20 for the exam, diagnosis, procedures, and orders are all accurate and complete.   IF YOU HAVE BEEN REFERRED TO A SPECIALIST, IT MAY TAKE 1-2 WEEKS TO SCHEDULE/PROCESS THE REFERRAL. IF YOU HAVE NOT HEARD FROM US/SPECIALIST IN TWO WEEKS, PLEASE GIVE [place] A CALL AT [phone] X [phone].   THE PATIENT IS ENCOURAGED TO PRACTICE SOCIAL DISTANCING DUE TO THE COVID-19 PANDEMIC.

## 2020-05-31 LAB — HEMOGLOBIN A1C
Est. average glucose Bld gHb Est-mCnc: 289 mg/dL
Hgb A1c MFr Bld: 11.7 % — ABNORMAL HIGH (ref 4.8–5.6)

## 2020-06-05 ENCOUNTER — Telehealth: Payer: Self-pay

## 2020-06-05 NOTE — Chronic Care Management (AMB) (Signed)
Chronic Care Management Pharmacy Assistant   Name: Helen Richardson  MRN: [id number] DOB: 1933/07/29  Reason for Encounter: Medication Review/ Medication Coordination Call/ Patient Assistance Coordination   Recent office visits:  05/03/2020- Enrique Sack Humble (CCM) 05/09/2020- Theador Hawthorne Humble (CCM) 05/14/2020- Enrique Sack Humble (CCM) 05/30/2020- Bevelyn Ngo (CCM) 05/30/2020- Arnette Felts, FNP (PCP)  Recent consult visits:  05/14/2020- [facility] - Dierdre Harness , NP  Hospital visits:  Medication Reconciliation was completed by comparing discharge summary, patient's EMR and Pharmacy list, and upon discussion with patient.  Admitted to the hospital on 05/20/2020 due to Near Syncope. Discharge date was 05/21/2020. Discharged from [facility].    New?Medications Started at [facility] Discharge:?? -None  Medication Changes at Hospital Discharge: - None  Medications Discontinued at Hospital Discharge: -None  Medications that remain the same after Hospital Discharge:??  -All other medications will remain the same.    Reviewed chart for medication changes ahead of medication coordination call.  Medication changes indicated: Rosuvastatin 20 mg discontinued. Patient to take 10 mg- 1 tablet by mouth Monday - Friday, none on Saturday  Or Sunday- 05/22/2020. Evaristo Bury increased to 40 units daily and Ozempic 0.25 mg once weekly added on 06/11/2020.   BP Readings from Last 3 Encounters:  05/30/20 132/74  05/21/20 (!) 155/75  04/04/20 122/76    Lab Results  Component Value Date   HGBA1C 11.7 (H) 05/30/2020     Patient obtains medications through Adherence Packaging  30 Days   Last adherence delivery included:  Carvedilol 6.25 mg- 1 tablet twice daily (before breakfast and evening meal)  Telmisartan80 mg- 1 tablet daily (before breakfast) Rosuvastatin 20 mg- 1 tablet daily (bedtime) Potassium 20 meq- 1 tablet daily (breakfast) Furosemide 40 mg- 1  tablet daily (before breakfast) Caltrate Bone Health (calcium) 600 mg + D3- 1 tablet daily (breakfast).   Patient declined the following medications last month:  Tresiba FlexTouch 200 u/ml- 36 units under skin daily due to having3 new boxes and (3 pens in each) on hand. Pen Needle 32Gneedles- Use with insulin daily.   Patient is due for next adherence delivery on: 06/08/2020. 06/05/2020-Called patient and reviewed medications and coordinated delivery, no answer, left message to return call. 06/08/2020- Called Granddaughter-Ashley to review medications, no answer, left message to return call. 06/11/2020- Called patient home again, spoke with patient and Nurse Practitioner- Mia w/[facility]), reviewed medications and coordinated delivery for 06/12/2020.   This delivery to include: Carvedilol 6.25 mg- 1 tablet twice daily (before breakfast and evening meal)  Telmisartan80 mg- 1 tablet daily (before breakfast) Rosuvastatin 10 mg- 1 tablet daily on Monday- Friday, none on Saturday or Sunday (bedtime) Potassium 20 meq- 1 tablet daily (breakfast) Furosemide 40 mg- 1 tablet daily (before breakfast) Caltrate Bone Health (calcium) 600 mg + D3- 1 tablet daily (breakfast).   No short fill or acute fill needed.  Patient declined the following medications: Tresiba FlexTouch 200 u/ml- 40 units under skin daily (recent increase) due to havingmore supply on hand than last verified, per [facility] NP, patient has 1 box with 4 pens (not used), 4 boxes with 3 pens (not used) and 1 box open with 1 full pen and 1 pen used.  Pen Needle 32Gneedles- Use with insulin daily due to having an abundance on hand, NP unable to count how many.    Patient needs refills for: Rosuvastatin 10 mg- 1 tablet daily on Monday- Friday, none on Saturday or Sunday (bedtime)- change in dose and direction,  prescription request sent to PCP office.   Confirmed delivery date of 06/12/2020, advised patient that pharmacy will  contact them the morning of delivery.  Medications: Outpatient Encounter Medications as of 06/05/2020  Medication Sig Note  . Alcohol Swabs (ALCOHOL PREP) 70 % PADS  03/10/2013: Received from: External Pharmacy  . aspirin 81 MG chewable tablet Chew 81 mg by mouth at bedtime.   . Calcium Carbonate-Vitamin D (CALTRATE 600+D PO) Take 1 tablet by mouth daily.   . carvedilol (COREG) 6.25 MG tablet TAKE 1 TABLET BY MOUTH TWICE DAILY (Patient taking differently: Take 6.25 mg by mouth 2 (two) times daily with a meal.)   . diclofenac sodium (VOLTAREN) 1 % GEL APPLY TO AFFECTED AREA 3 TIMES DAILY AS NEEDED (Patient taking differently: Apply 2 g topically 3 (three) times daily as needed (pain).)   . furosemide (LASIX) 40 MG tablet TAKE 1 TABLET(40 MG) BY MOUTH DAILY   . glucose blood test strip Test up to 4 times daily   . Insulin Pen Needle 32G X 4 MM MISC Use with insulin Dx code e11.65   . nystatin (NYSTATIN) powder Apply 1 application topically 3 (three) times daily.   . polyethylene glycol powder (GLYCOLAX/MIRALAX) 17 GM/SCOOP powder    . potassium chloride SA (KLOR-CON) 20 MEQ tablet Take 1 tablet (20 mEq total) by mouth once for 1 dose. Please d/c 2 rx for 2 times per day thanks   . rosuvastatin (CRESTOR) 10 MG tablet TAKE 1 TABLET BY MOUTH MONDAY-FRIDAY. DO NOT TAKE ON SATURDAY OR SUNDAY (Patient taking differently: Take 10 mg by mouth See admin instructions. MONDAY-FRIDAY. DO NOT TAKE ON SATURDAY OR SUNDAY)   . telmisartan (MICARDIS) 80 MG tablet TAKE 1 TABLET(80 MG) BY MOUTH DAILY (Patient taking differently: Take 80 mg by mouth daily.)   . TRADJENTA 5 MG TABS tablet Take 5 mg by mouth daily.   . TRESIBA FLEXTOUCH 200 UNIT/ML FlexTouch Pen INJECT 36 UNITS UNDER THE SKIN DAILY. MAX TITRATION 80 UNITS (Patient taking differently: Inject 36 Units into the skin daily.)   . UNABLE TO FIND lancets 30 gauge   . UNABLE TO FIND Comfort EZ Pen Needles 32 gauge x 5/32"    No facility-administered encounter  medications on file as of 06/05/2020.    Star Rating Drugs: Rosuvastatin 20 mg- Last filled 05/14/2020 for 30 day supply at [facility]. Telmisartan 80 mg- Last filled 05/14/2020 for 30 day supply at [facility]. Tradjenta 5 mg- Last filled 10/11/2018 for 30 day supply at [facility].  Notes: Patient notified of mychart message sent 06/11/2020 from PCP: Your HgbA1c is slightly better at 11.7 down from 12.6 increase your Tresiba to 40 units daily and I would like to add a medication called Ozempic 0.25 mg weekly for 4 weeks then 0.5 mg, you can come and pick up a sample at the office. We need to get your HgbA1c down. F/u in 8 weeks. Call us on Mon and [place] with your blood sugar readings. Mia- Nurse Practioner with [facility] affiliated with patient insurance [employer], given instructions to follow up with granddaughter and home health aid.   Patient states she has her blood sugars checked after she eats, none fasting, last bs reading 150. Recent blood pressure reading with Mia- NP with [facility]) today 120/76.  DTPs: Patient is not taking [place] as prescribed, Tresiba FlexTouch 200 u/ml- 40 units under skin daily (recent increase) patient had an abundance due to son purchasing them for her but  she still has the same amount of supply for the last 2-3 months. Per [facility] NP, patient has 1 box with 4 pens (not used), 4 boxes with 3 pens (not used) and 1 box open with 1 full pen and 1 pen used. Patient does not check blood sugars fasting and A1c @ 11.7.   Nurse Practitioner- Mia also informed me that patient was having trouble paying home health aide, times that aid was usually there has decreased, she is only there now 5 days a week from 9-930 am to around 12 pm. Working on Patient assistance application for Ozempic for patient to help with cost after sample use.  Cherylin Mylar, CPP notified.   06/11/2020- Patient assistance forms filled out for Ozempic with [employer] patient  assistance program. Mailing form to patient, patient to have granddaughter bring back to PCP office for Dr Allyne Gee to sign and Cherylin Mylar, CPP to fax.   SIG: [name], CMA Clinical Pharmacist Assistant [phone]

## 2020-06-10 ENCOUNTER — Encounter: Payer: Self-pay | Admitting: Nurse Practitioner

## 2020-06-11 ENCOUNTER — Telehealth: Payer: Self-pay

## 2020-06-11 ENCOUNTER — Telehealth: Payer: Medicare Other

## 2020-06-11 ENCOUNTER — Other Ambulatory Visit: Payer: Self-pay

## 2020-06-11 MED ORDER — ROSUVASTATIN CALCIUM 10 MG PO TABS: 10.0000 mg | ORAL_TABLET | ORAL | 3 refills | Status: DC

## 2020-06-11 NOTE — Telephone Encounter (Signed)
  Chronic Care Management   Outreach Note  06/11/2020 Name: Helen Richardson MRN: [id number] DOB: 30-Jan-1934  Referred by: Dorothyann Peng, MD Reason for referral : Chronic Care Management (Unsuccessful call)   An unsuccessful telephone outreach was attempted today. The patient was referred to the case management team for assistance with care management and care coordination.   Follow Up Plan: The care management team will reach out to the patient again over the next 21 days.   Bevelyn Ngo, BSW, CDP Social Worker, Certified Dementia Practitioner [facility] Care Management [phone]

## 2020-06-12 ENCOUNTER — Ambulatory Visit: Payer: Medicare PPO | Admitting: Internal Medicine

## 2020-06-18 ENCOUNTER — Telehealth: Payer: Self-pay | Admitting: *Deleted

## 2020-06-18 ENCOUNTER — Telehealth: Payer: Medicare Other

## 2020-06-18 ENCOUNTER — Telehealth: Payer: Self-pay

## 2020-06-18 ENCOUNTER — Ambulatory Visit (INDEPENDENT_AMBULATORY_CARE_PROVIDER_SITE_OTHER): Payer: Medicare Other

## 2020-06-18 DIAGNOSIS — Z794 Long term (current) use of insulin: Secondary | ICD-10-CM

## 2020-06-18 DIAGNOSIS — N183 Chronic kidney disease, stage 3 unspecified: Secondary | ICD-10-CM | POA: Diagnosis not present

## 2020-06-18 DIAGNOSIS — E1122 Type 2 diabetes mellitus with diabetic chronic kidney disease: Secondary | ICD-10-CM | POA: Diagnosis not present

## 2020-06-18 DIAGNOSIS — I509 Heart failure, unspecified: Secondary | ICD-10-CM | POA: Diagnosis not present

## 2020-06-18 DIAGNOSIS — I10 Essential (primary) hypertension: Secondary | ICD-10-CM

## 2020-06-18 NOTE — Patient Instructions (Signed)
Goals Addressed    Other   .  Monitor and Manage My Blood Sugar-Diabetes Type 2   On track     Timeframe:  Long-Range Goal Priority:  High Start Date: 06/18/20                            Expected End Date: 12/18/20                      Follow Up Date: 07/25/20    - check blood sugar at prescribed times - check blood sugar if I feel it is too high or too low - enter blood sugar readings and medication or insulin into daily log - take the blood sugar log to all doctor visits - take the blood sugar meter to all doctor visits    Why is this important?    Checking your blood sugar at home helps to keep it from getting very high or very low.   Writing the results in a diary or log helps the doctor know how to care for you.   Your blood sugar log should have the time, date and the results.   Also, write down the amount of insulin or other medicine that you take.   Other information, like what you ate, exercise done and how you were feeling, will also be helpful.     Notes:     .  Obtain Eye Exam-Diabetes Type 2   On track     Timeframe:  Long-Range Goal Priority:  Medium Start Date: 06/18/20                            Expected End Date: 12/18/20                       Follow Up Date: 07/25/20    - keep appointment with eye doctor - schedule appointment with eye doctor    Why is this important?    Eye check-ups are important when you have diabetes.   Vision loss can be prevented.    Notes:     .  Perform Foot Care-Diabetes Type 2   On track     Timeframe:  Long-Range Goal Priority:  Medium Start Date: 06/18/20                           Expected End Date: 12/18/20                     Follow Up Date: 07/25/20   - check feet daily for cuts, sores or redness - keep feet up while sitting - trim toenails straight across - wash and dry feet carefully every day - wear comfortable, cotton socks - wear comfortable, well-fitting shoes    Why is this important?    Good foot  care is very important when you have diabetes.   There are many things you can do to keep your feet healthy and catch a problem early.    Notes:

## 2020-06-18 NOTE — Chronic Care Management (AMB) (Signed)
  Care Management   Note  06/18/2020 Name: VERONIQUE WARGA MRN: [id number] DOB: 04-08-1933  [name] is a 87 y.o. year old female who is a primary care patient of Dorothyann Peng, MD and is actively engaged with the care management team. I reached out to [name] by phone today to assist with scheduling a follow up visit with the Pharmacist  Follow up plan: Unsuccessful telephone outreach attempt made. A HIPAA compliant phone message was left for the patient providing contact information and requesting a return call.  The care management team will reach out to the patient again over the next 7 days.  If patient returns call to provider office, please advise to call Embedded Care Management Care Guide Gwenevere Ghazi at [phone].  Gwenevere Ghazi  Care Guide, Embedded Care Coordination [facility] Management

## 2020-06-18 NOTE — Telephone Encounter (Signed)
Called the pt and left a detailed message that I was calling to see if she can come to see Dr. Allyne Gee tomorrow at 2 instead of coming for the 11am nurse visit because the patient was speaking to the nurse angle with thn and said she needed to be seen.

## 2020-06-18 NOTE — Chronic Care Management (AMB) (Signed)
Chronic Care Management   CCM RN Visit Note  06/18/2020 Name: Helen Richardson MRN: [id number] DOB: March 24, 1933  Subjective: Helen Richardson is a 87 y.o. year old female who is a primary care patient of Glendale Chard, MD. The care management team was consulted for assistance with disease management and care coordination needs.    Engaged with patient by telephone for follow up visit in response to provider referral for case management and/or care coordination services.   Consent to Services:  The patient was given information about Chronic Care Management services, agreed to services, and gave verbal consent prior to initiation of services.  Please see initial visit note for detailed documentation.   Patient agreed to services and verbal consent obtained.   Assessment: Review of patient past medical history, allergies, medications, health status, including review of consultants reports, laboratory and other test data, was performed as part of comprehensive evaluation and provision of chronic care management services.   SDOH (Social Determinants of Health) assessments and interventions performed:  Yes, no acute challenges   CCM Care Plan  Allergies  Allergen Reactions  . Tradjenta [Linagliptin] Swelling    Outpatient Encounter Medications as of 06/18/2020  Medication Sig Note  . Alcohol Swabs (ALCOHOL PREP) 70 % PADS  03/10/2013: Received from: External Pharmacy  . aspirin 81 MG chewable tablet Chew 81 mg by mouth at bedtime.   . Calcium Carbonate-Vitamin D (CALTRATE 600+D PO) Take 1 tablet by mouth daily.   . carvedilol (COREG) 6.25 MG tablet TAKE 1 TABLET BY MOUTH TWICE DAILY (Patient taking differently: Take 6.25 mg by mouth 2 (two) times daily with a meal.)   . diclofenac sodium (VOLTAREN) 1 % GEL APPLY TO AFFECTED AREA 3 TIMES DAILY AS NEEDED (Patient taking differently: Apply 2 g topically 3 (three) times daily as needed (pain).)   . furosemide (LASIX) 40 MG tablet TAKE 1 TABLET(40  MG) BY MOUTH DAILY   . glucose blood test strip Test up to 4 times daily   . Insulin Pen Needle 32G X 4 MM MISC Use with insulin Dx code e11.65   . nystatin (NYSTATIN) powder Apply 1 application topically 3 (three) times daily.   . polyethylene glycol powder (GLYCOLAX/MIRALAX) 17 GM/SCOOP powder    . potassium chloride SA (KLOR-CON) 20 MEQ tablet Take 1 tablet (20 mEq total) by mouth once for 1 dose. Please d/c 2 rx for 2 times per day thanks   . rosuvastatin (CRESTOR) 10 MG tablet Take 1 tablet (10 mg total) by mouth See admin instructions. MONDAY-FRIDAY. DO NOT TAKE ON SATURDAY OR SUNDAY   . telmisartan (MICARDIS) 80 MG tablet TAKE 1 TABLET(80 MG) BY MOUTH DAILY (Patient taking differently: Take 80 mg by mouth daily.)   . TRADJENTA 5 MG TABS tablet Take 5 mg by mouth daily.   . TRESIBA FLEXTOUCH 200 UNIT/ML FlexTouch Pen INJECT 36 UNITS UNDER THE SKIN DAILY. MAX TITRATION 80 UNITS (Patient taking differently: Inject 36 Units into the skin daily.)   . UNABLE TO FIND lancets 30 gauge   . UNABLE TO FIND Comfort EZ Pen Needles 32 gauge x 5/32"    No facility-administered encounter medications on file as of 06/18/2020.    Patient Active Problem List   Diagnosis Date Noted  . Controlled type 2 diabetes mellitus with stable proliferative retinopathy of both eyes, with long-term current use of insulin ([place]) 07/04/2019  . Intermediate stage nonexudative age-related macular degeneration of both eyes 07/04/2019  . Posterior vitreous detachment of right  eye 07/04/2019  . Degenerative retinal drusen of both eyes 07/04/2019  . Chorioretinal scar 07/04/2019  . Pincer nail deformity 04/12/2019  . AKI (acute kidney injury) ([place]) 06/11/2018  . Sepsis due to gram-negative UTI ([place]) 06/11/2018  . Acute lower UTI (urinary tract infection) 06/10/2018  . HTN (hypertension) 06/10/2018  . Joint swelling 06/10/2018  . Hyperkalemia 06/10/2018  . Hyponatremia 06/10/2018  . Normocytic anemia 06/10/2018  . Type 2  diabetes mellitus with stage 3 chronic kidney disease, with long-term current use of insulin ([place]) 03/08/2018  . 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 Pure hypercholesterolemia 03/08/2018  . Class 3 severe obesity due to excess calories with serious comorbidity and body mass index (BMI) of 40.0 to 44.9 in adult [facility]) 03/08/2018    Conditions to be addressed/monitored:DM, HTN, s/p fall, Medication Management, CHF  Care Plan : Diabetes Type 2 (Adult)  Updates made by Lynne Logan, RN since 06/18/2020 12:00 AM    Problem: Glycemic Management (Diabetes, Type 2)   Priority: High    Long-Range Goal: Glycemic Management Optimized   Start Date: 06/18/2020  Expected End Date: 12/18/2020  This Visit's Progress: On track  Priority: High  Note:   Objective:  Lab Results  Component Value Date   HGBA1C 11.7 (H) 05/30/2020 .   Lab Results  Component Value Date   CREATININE 1.04 (H) 05/20/2020   CREATININE 1.01 (H) 03/14/2020   CREATININE 0.78 02/22/2019 .   [name] Kitchen No results found for: EGFR Current Barriers:  [name] Kitchen Knowledge Deficits related to basic Diabetes pathophysiology and self care/management . Knowledge Deficits related to medications used for management of diabetes Case Manager Clinical Goal(s):  . patient will demonstrate improved adherence to prescribed treatment plan for diabetes self care/management as evidenced by: daily monitoring and recording of CBG  adherence to ADA/ carb modified diet exercise 5 days/week adherence to prescribed medication regimen contacting provider for new or worsened symptoms or questions Interventions:  . Collaboration with Glendale Chard, MD regarding development and update of comprehensive plan of care as evidenced by provider attestation and co-signature . Inter-disciplinary care team collaboration (see longitudinal plan of care) . Provided education to patient about basic DM disease process . Review of patient status, including review of  consultants reports, relevant laboratory and other test results, and medications completed. . Reviewed medications with patient and discussed importance of medication adherence . Provided patient with written educational materials related to hypo and hyperglycemia and importance of correct treatment . Reviewed scheduled/upcoming provider appointments including: next PCP follow up appointment scheduled for 06/19/20 . Advised patient, providing education and rationale, to check cbg daily before meals and at bedtime and record, calling the PCP and or CCM RN for findings outside established parameters.   . Discussed plans with patient for ongoing care management follow up and provided patient with direct contact information for care management team Self-Care Activities - Self administers oral medications as prescribed Self administers insulin as prescribed Attends all scheduled provider appointments Checks blood sugars as prescribed and utilize hyper and hypoglycemia protocol as needed Adheres to prescribed ADA/carb modified Patient Goals: - check blood sugar at prescribed times - check blood sugar if I feel it is too high or too low - enter blood sugar readings and medication or insulin into daily log - take the blood sugar log to all doctor visits - take the blood sugar meter to all doctor visits - keep appointment with eye doctor - schedule appointment with eye doctor - check feet daily  for cuts, sores or redness - keep feet up while sitting - trim toenails straight across - wash and dry feet carefully every day - wear comfortable, cotton socks - wear comfortable, well-fitting shoes Follow Up Plan: Telephone follow up appointment with care management team member scheduled for: 07/25/20    Plan:Telephone follow up appointment with care management team member scheduled for:  07/25/20  Barb Merino, RN, BSN, CCM Care Management Coordinator [place] [facility]  Direct Phone: [phone]

## 2020-06-19 ENCOUNTER — Ambulatory Visit: Payer: Medicare Other

## 2020-06-19 ENCOUNTER — Other Ambulatory Visit: Payer: Self-pay

## 2020-06-19 VITALS — BP 132/80 | HR 43 | Temp 98.1°F | Ht 63.0 in | Wt 202.0 lb

## 2020-06-19 DIAGNOSIS — Z794 Long term (current) use of insulin: Secondary | ICD-10-CM

## 2020-06-19 NOTE — Progress Notes (Signed)
Pt is here for ozempic training.

## 2020-06-21 ENCOUNTER — Telehealth: Payer: Self-pay | Admitting: Internal Medicine

## 2020-06-21 NOTE — Chronic Care Management (AMB) (Signed)
  Care Management   Note  06/21/2020 Name: Helen Richardson MRN: [id number] DOB: Oct 25, 1933  Helen Richardson is a 87 y.o. year old female who is a primary care patient of Dorothyann Peng, MD and is actively engaged with the care management team. I reached out to Helen Richardson by phone today to assist with scheduling a follow up visit with the Pharmacist  Follow up plan: Telephone appointment with care management team member scheduled for:07/05/2020  [facility] Guide, Embedded Care Coordination [facility]  Care Management

## 2020-06-21 NOTE — Telephone Encounter (Signed)
Left message for patient to call back and schedule Medicare Annual Wellness Visit (AWV) either virtually or in office.   Last AWV 02/19/2019 please schedule at anytime with [facility]    This should be a 45 minute visit.

## 2020-06-29 ENCOUNTER — Telehealth: Payer: Medicare Other

## 2020-06-29 DIAGNOSIS — E119 Type 2 diabetes mellitus without complications: Secondary | ICD-10-CM | POA: Diagnosis not present

## 2020-07-03 ENCOUNTER — Telehealth: Payer: Self-pay

## 2020-07-03 NOTE — Chronic Care Management (AMB) (Addendum)
Chronic Care Management Pharmacy Assistant   Name: Helen Richardson  MRN: [id number] DOB: 1933-08-13   Reason for Encounter: Medication Review/ Medication coordination     Recent office visits:  06-11-2020 Bevelyn Ngo (CCM)  06-18-2020 Riley Churches., RN (CCM)  06-19-2020 Coolidge Breeze, CMA (PCP) Ozempic training   Recent consult visits:  None  Hospital visits:  None in previous 6 months  Medications: Outpatient Encounter Medications as of 07/03/2020  Medication Sig Note   Alcohol Swabs (ALCOHOL PREP) 70 % PADS  03/10/2013: Received from: External Pharmacy   aspirin 81 MG chewable tablet Chew 81 mg by mouth at bedtime.    Calcium Carbonate-Vitamin D (CALTRATE 600+D PO) Take 1 tablet by mouth daily.    carvedilol (COREG) 6.25 MG tablet TAKE 1 TABLET BY MOUTH TWICE DAILY (Patient taking differently: Take 6.25 mg by mouth 2 (two) times daily with a meal.)    diclofenac sodium (VOLTAREN) 1 % GEL APPLY TO AFFECTED AREA 3 TIMES DAILY AS NEEDED (Patient taking differently: Apply 2 g topically 3 (three) times daily as needed (pain).)    furosemide (LASIX) 40 MG tablet TAKE 1 TABLET(40 MG) BY MOUTH DAILY    glucose blood test strip Test up to 4 times daily    Insulin Pen Needle 32G X 4 MM MISC Use with insulin Dx code e11.65    nystatin (NYSTATIN) powder Apply 1 application topically 3 (three) times daily.    polyethylene glycol powder (GLYCOLAX/MIRALAX) 17 GM/SCOOP powder     potassium chloride SA (KLOR-CON) 20 MEQ tablet Take 1 tablet (20 mEq total) by mouth once for 1 dose. Please d/c 2 rx for 2 times per day thanks    rosuvastatin (CRESTOR) 10 MG tablet Take 1 tablet (10 mg total) by mouth See admin instructions. MONDAY-FRIDAY. DO NOT TAKE ON SATURDAY OR SUNDAY    Semaglutide (OZEMPIC, 0.25 OR 0.5 MG/DOSE, [place]) Inject 0.25 mg into the skin.    telmisartan (MICARDIS) 80 MG tablet TAKE 1 TABLET(80 MG) BY MOUTH DAILY (Patient taking differently: Take 80 mg by mouth daily.)     TRADJENTA 5 MG TABS tablet Take 5 mg by mouth daily.    TRESIBA FLEXTOUCH 200 UNIT/ML FlexTouch Pen INJECT 36 UNITS UNDER THE SKIN DAILY. MAX TITRATION 80 UNITS (Patient taking differently: Inject 36 Units into the skin daily.)    UNABLE TO FIND lancets 30 gauge    UNABLE TO FIND Comfort EZ Pen Needles 32 gauge x 5/32"    No facility-administered encounter medications on file as of 07/03/2020.   Reviewed chart for medication changes ahead of medication coordination call.  No medication changes indicated OR if recent visit, treatment plan here.  BP Readings from Last 3 Encounters:  06/19/20 132/80  05/30/20 132/74  05/21/20 (!) 155/75    Lab Results  Component Value Date   HGBA1C 11.7 (H) 05/30/2020     Patient obtains medications through Adherence Packaging  30 Days   Last adherence delivery included:  Carvedilol 6.25 mg- 1 tablet twice daily (before breakfast and evening meal)  Telmisartan 80 mg - 1 tablet daily (before breakfast) Rosuvastatin 10 mg- 1 tablet daily (bedtime) Potassium 20 meq- 1 tablet daily (breakfast) Furosemide 40 mg- 1 tablet daily (before breakfast) Caltrate Bone Health (calcium) 600 mg + D3- 1 tablet daily (breakfast)  Explanation of abundance on hand (ie #30 due to overlapping fills or previous adherence issues etc)  Patient is due for next adherence delivery on: 07-11-2020. 07-03-2020- Called patient  for medication review/ coordination patient stated to her knowledge she needs the same medications from last month and 07-11-2020 delivery is ok. Patient stated the nurse comes M-F 9:30-11:00 and to call back on 07-04-2020 between those times for BP and sugar readings and to clarify coordination. 07-04-2020- Called patient back to speak with nurse at 9:45, 10:30 and 11:00. No answer left VM 07-04-2020- Called patient's grand daughter Helen Richardson. No answer left VM.  This delivery to include: Carvedilol 6.25 mg- 1 tablet twice daily (before breakfast and evening  meal)  Telmisartan 80 mg - 1 tablet daily (before breakfast) Rosuvastatin 10 mg- 1 tablet daily on Monday- Friday, none on Saturday or Sunday (bedtime) Potassium 20 meq- 1 tablet daily (breakfast) Furosemide 40 mg- 1 tablet daily (before breakfast) Caltrate Bone Health (calcium) 600 mg + D3- 1 tablet daily (breakfast).   No short fill or acute fill needed  Patient declined the following medications (meds) due to (reason).-Unable to obtain information  Confirmed delivery date of 07-11-2020, advised patient that pharmacy will contact them the morning of delivery.   Star Rating Drugs: Rosuvastatin 10 mg  Last filled 06-12-2020 [facility] Telmisartan 80 mg  Last filled 06-12-2020 [facility] Tradjenta 5 mg  Last filled 10-11-2018 [facility]   Huey Romans [facility] Concierge  [phone]  I have reviewed the care management and care coordination activities outlined in this encounter and I am certifying that I agree with the content of this note. No further action required.  Harlan Stains, [facility] 07/12/20 8:29 AM

## 2020-07-04 ENCOUNTER — Telehealth: Payer: Self-pay

## 2020-07-04 NOTE — Chronic Care Management (AMB) (Signed)
Left patient an appointment reminder with Cherylin Mylar ,RPH on 07-05-20 at 9:30am. Instructed patient to have all meds/supplements, any logs available for review and if unable to keep appointment to call to reschedule. Also called to follow up with medication delivery.  Malecca Willa Rough [facility] Health Concierge  [phone]

## 2020-07-05 ENCOUNTER — Telehealth: Payer: Self-pay

## 2020-07-05 ENCOUNTER — Telehealth: Payer: Medicare Other

## 2020-07-05 NOTE — Telephone Encounter (Signed)
Called patient for follow up visit this morning. Left a HIPAA compliant voicemail to call back.   Total Time: 3 minutes   Cherylin Mylar, PharmD Clinical Pharmacist [facility] [phone]

## 2020-07-06 ENCOUNTER — Telehealth: Payer: Medicare Other

## 2020-07-09 ENCOUNTER — Telehealth: Payer: Self-pay

## 2020-07-09 NOTE — Chronic Care Management (AMB) (Addendum)
Patient called requesting a number to a retirement home. Patient seemed to be confused and stated she has no one to help her and the aide is suppose to come Monday-Friday 9:30-11:30. I've been trying to get in contact with patient's nurse with no success to coordinate delivery for patient. Patient kept trying to explain that her son handles all healthcare needs but has moved out of town and is very busy during the day. Patient stated the nurse that comes in is a friend of the family and couldn't recall if she came earlier that day. Sent a message to Cherylin Mylar RPH, [name] PTM, Bevelyn Ngo and Dorothyann Peng I assured that the patient felt safe to be alone that night and the social worker Bevelyn Ngo will follow up with her in the morning.   Malecca Willa Rough [facility] Clinical Pharmacist Assistant [phone]    I have reviewed the care management and care coordination activities outlined in this encounter and I am certifying that I agree with the content of this note. The CCM social worker was contacted, as well as Dr. Allyne Gee.   Harlan Stains, [facility]

## 2020-07-10 ENCOUNTER — Ambulatory Visit (INDEPENDENT_AMBULATORY_CARE_PROVIDER_SITE_OTHER): Payer: Medicare Other

## 2020-07-10 ENCOUNTER — Telehealth: Payer: Self-pay

## 2020-07-10 DIAGNOSIS — I509 Heart failure, unspecified: Secondary | ICD-10-CM

## 2020-07-10 DIAGNOSIS — Z794 Long term (current) use of insulin: Secondary | ICD-10-CM

## 2020-07-10 DIAGNOSIS — E1122 Type 2 diabetes mellitus with diabetic chronic kidney disease: Secondary | ICD-10-CM

## 2020-07-10 DIAGNOSIS — I10 Essential (primary) hypertension: Secondary | ICD-10-CM

## 2020-07-10 DIAGNOSIS — N183 Chronic kidney disease, stage 3 unspecified: Secondary | ICD-10-CM

## 2020-07-10 NOTE — Telephone Encounter (Signed)
-----   Message from Dorothyann Peng, MD sent at 07/09/2020  4:58 PM EDT ----- Please call to check on pt - how is she sounding? Does she sound confused? Does she want to move? Please contact grandchildren to notify them of what happened today.   RS ----- Message ----- From: Huey Romans Sent: 07/09/2020   4:39 PM EDT To: Dorothyann Peng, MD, Bevelyn Ngo, #  Good evening received a call from patient requesting a number for a retirement home. Patient seemed lost and confused which is alarming to me Patient stated she has no one to help her and the aide is suppose to come Monday-Friday 9:30-11:30. Informed [name] and Cherylin Mylar.Haven't been able to speak with aide but maybe someone could do a drop in wellness visit around the time her aide is suppose to be there tomorrow. Will contact patient to make sure she is safe for the night.  Malecca Willa Rough [facility] Health Concierge  [phone]

## 2020-07-10 NOTE — Chronic Care Management (AMB) (Signed)
Chronic Care Management    Social Work Note  07/10/2020 Name: Helen Richardson MRN: [id number] DOB: [birth year]  Helen Richardson is a 87 y.o. year old female who is a primary care patient of Dorothyann Peng, MD. The CCM team was consulted to assist the patient with chronic disease management and/or care coordination needs related to: Level of Care Concerns.   Engaged with patients grand-daughter Helen Richardson by phone for follow up visit in response to provider referral for social work chronic care management and care coordination services.   Consent to Services:  The patient was given information about Chronic Care Management services, agreed to services, and gave verbal consent prior to initiation of services.  Please see initial visit note for detailed documentation.   Patient agreed to services and consent obtained.   Assessment: Review of patient past medical history, allergies, medications, and health status, including review of relevant consultants reports was performed today as part of a comprehensive evaluation and provision of chronic care management and care coordination services.     SDOH (Social Determinants of Health) assessments and interventions performed:    Advanced Directives Status: Not addressed in this encounter.  CCM Care Plan  Allergies  Allergen Reactions  . Tradjenta [Linagliptin] Swelling    Outpatient Encounter Medications as of 07/10/2020  Medication Sig Note  . Alcohol Swabs (ALCOHOL PREP) 70 % PADS  03/10/2013: Received from: External Pharmacy  . aspirin 81 MG chewable tablet Chew 81 mg by mouth at bedtime.   . Calcium Carbonate-Vitamin D (CALTRATE 600+D PO) Take 1 tablet by mouth daily.   . carvedilol (COREG) 6.25 MG tablet TAKE 1 TABLET BY MOUTH TWICE DAILY (Patient taking differently: Take 6.25 mg by mouth 2 (two) times daily with a meal.)   . diclofenac sodium (VOLTAREN) 1 % GEL APPLY TO AFFECTED AREA 3 TIMES DAILY AS NEEDED (Patient taking differently:  Apply 2 g topically 3 (three) times daily as needed (pain).)   . furosemide (LASIX) 40 MG tablet TAKE 1 TABLET(40 MG) BY MOUTH DAILY   . glucose blood test strip Test up to 4 times daily   . Insulin Pen Needle 32G X 4 MM MISC Use with insulin Dx code e11.65   . nystatin (NYSTATIN) powder Apply 1 application topically 3 (three) times daily.   . polyethylene glycol powder (GLYCOLAX/MIRALAX) 17 GM/SCOOP powder    . potassium chloride SA (KLOR-CON) 20 MEQ tablet Take 1 tablet (20 mEq total) by mouth once for 1 dose. Please d/c 2 rx for 2 times per day thanks   . rosuvastatin (CRESTOR) 10 MG tablet Take 1 tablet (10 mg total) by mouth See admin instructions. MONDAY-FRIDAY. DO NOT TAKE ON SATURDAY OR SUNDAY   . Semaglutide (OZEMPIC, 0.25 OR 0.5 MG/DOSE, [place]) Inject 0.25 mg into the skin.   [name] Kitchen telmisartan (MICARDIS) 80 MG tablet TAKE 1 TABLET(80 MG) BY MOUTH DAILY (Patient taking differently: Take 80 mg by mouth daily.)   . TRADJENTA 5 MG TABS tablet Take 5 mg by mouth daily.   . TRESIBA FLEXTOUCH 200 UNIT/ML FlexTouch Pen INJECT 36 UNITS UNDER THE SKIN DAILY. MAX TITRATION 80 UNITS (Patient taking differently: Inject 36 Units into the skin daily.)   . UNABLE TO FIND lancets 30 gauge   . UNABLE TO FIND Comfort EZ Pen Needles 32 gauge x 5/32"    No facility-administered encounter medications on file as of 07/10/2020.    Patient Active Problem List   Diagnosis Date Noted  . Controlled type 2  diabetes mellitus with stable proliferative retinopathy of both eyes, with long-term current use of insulin (HCC) 07/04/2019  . Intermediate stage nonexudative age-related macular degeneration of both eyes 07/04/2019  . Posterior vitreous detachment of right eye 07/04/2019  . Degenerative retinal drusen of both eyes 07/04/2019  . Chorioretinal scar 07/04/2019  . Pincer nail deformity 04/12/2019  . AKI (acute kidney injury) (HCC) 06/11/2018  . Sepsis due to gram-negative UTI (HCC) 06/11/2018  . Acute lower UTI  (urinary tract infection) 06/10/2018  . HTN (hypertension) 06/10/2018  . Joint swelling 06/10/2018  . Hyperkalemia 06/10/2018  . Hyponatremia 06/10/2018  . Normocytic anemia 06/10/2018  . Type 2 diabetes mellitus with stage 3 chronic kidney disease, with long-term current use of insulin (HCC) 03/08/2018  . 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 Pure hypercholesterolemia 03/08/2018  . Class 3 severe obesity due to excess calories with serious comorbidity and body mass index (BMI) of 40.0 to 44.9 in adult [facility]) 03/08/2018    Conditions to be addressed/monitored: CHF, HTN and DMII; Level of care concerns  Care Plan : Social Work Care Plan  Updates made by Bevelyn Ngo since 07/10/2020 12:00 AM    Problem: Care Coordination     Long-Range Goal: Collaborate with RN Care Manager to perform appropriate assessments to assist with care coordination needs   Start Date: 02/02/2020  Expected End Date: 08/22/2020  Recent Progress: On track  Priority: Medium  Note:   Current Barriers:  . Chronic disease management support and education needs related to CHF, HTN, and DMII  . Transportation and Limited access to caregiver  Social Worker Clinical Goal(s):  [name] Kitchen Over the next 120 days, patient will work with SW to identify and address any acute and/or chronic care coordination needs related to the self health management of CHF, HTN, and DMII   CCM SW Interventions:  Current Barriers:  . 1:1 collaboration with Dorothyann Peng, MD regarding development and update of comprehensive plan of care as evidenced by provider attestation and co-signature . Inter-disciplinary care team collaboration (see longitudinal plan of care) . In basket message received from [facility] Willa Rough indicating the patient contacted her on 5.9.22 for assistance with locating a long term care community . Collaboration with [name] with [facility] to discuss pharmacy concerns surrounding patients  current A1C and concern of medication non-compliance . Discussed SW plan to outreach the patient to assess for interest in placement at this time  . SW placed two unsuccessful outbound calls to the patient - voice message left requesting a return call . Successful outbound call placed the [facility], the patients grand-daughter to discuss patients reported desire for placement . Determined Helen Richardson feels the patient is lonely due to both [place] and [name] moving out of town and not visiting as often as they used to. Helen Richardson also reports her uncles (the patients sons) do not visit but her aunt who recently got a new car is visiting on a regular basis. The patients sons do pay for a morning caregiver to assist with morning ADL's and medication administration . Reviewed with Helen Richardson the patient is on the wait list for mobile meals and is receiving a caregiver paid for by the [facility] block grant - unfortunately this is an inconsistent caregiver due to staffing challenges . Educated Helen Richardson on steps the patient may take to apply for long term care Medicaid if desired . Determined Helen Richardson feels the patient would become more isolated, lonely, and depressed if placed due to not being  in her own home . Encouraged Helen Richardson to contact SW as needed if placement is desired . Collaboration with patients primary care team to provide an update on outcome of today's call . Scheduled follow up call to the patient over the next 10 days  Patient Goals/Self-Care Activities . Over the next 30 days, patient will:   - Attend all scheduled provider appointments -Call pharmacy for medication refills -Call provider office for new concerns or questions -Contact SW as needed prior to next scheduled call  Follow Up Plan: SW will follow up with the patient over the next 10 days       Follow Up Plan: SW will follow up with patient by phone over the next 10 days      Bevelyn Ngo, BSW, CDP Social Worker, Certified  Dementia Practitioner [facility] Care Management [phone]  Total time spent performing care coordination and/or care management activities with the patient by phone or face to face = 50 minutes.

## 2020-07-10 NOTE — Telephone Encounter (Signed)
I spoke with the patient and [name] a family member that sits there during the day with the patient, they both stated that the patient uses [facility] for her aid and that she hasn't had an aid in a week because the aid that was coming stopped coming out, they are still waiting for a replacement to come out and said that with how things have been going that an assisted living facility maybe an option.

## 2020-07-11 ENCOUNTER — Other Ambulatory Visit: Payer: Medicare PPO | Admitting: Hospice

## 2020-07-11 ENCOUNTER — Other Ambulatory Visit: Payer: Self-pay

## 2020-07-11 DIAGNOSIS — Z515 Encounter for palliative care: Secondary | ICD-10-CM | POA: Diagnosis not present

## 2020-07-11 DIAGNOSIS — G8929 Other chronic pain: Secondary | ICD-10-CM

## 2020-07-11 DIAGNOSIS — I10 Essential (primary) hypertension: Secondary | ICD-10-CM

## 2020-07-11 DIAGNOSIS — M545 Low back pain, unspecified: Secondary | ICD-10-CM

## 2020-07-11 NOTE — Progress Notes (Signed)
Therapist, nutritional Palliative Care Consult Note Telephone: [phone]  Fax: [phone]  PATIENT NAME: Helen Richardson DOB: 12-29-1933 MRN: [id number]  PRIMARY CARE PROVIDER:   Dorothyann Peng, MD Dorothyann Peng, MD [address] [place],  [place] [address]  REFERRING PROVIDER: Dorothyann Peng, MD Dorothyann Peng, MD [address] [place],  [place] [address]  RESPONSIBLE PARTY:Self [phone] Contact:Brandon Taylors, grandsonat [phone] Lessie Dings, caregiver [phone]- close family friend [name] [phone] 3152-caregiver  TELEHEALTH VISIT STATEMENT Due to the COVID-19 crisis, this visit was done via telephone from my office. It was initiated and consented to by this patient and/or family.   Visit is to build trust and highlight Palliative Medicine as specialized medical care for people living with serious illness, aimed at facilitating better quality of life through symptoms relief, assisting with advance care plan and establishing goals of care.   CHIEF COMPLAINT: Palliative follow up visit/Hypertension  RECOMMENDATIONS/PLAN:   1. Advance Care Planning/Code Status:Patient is a Full code  2. Goals of Care: Goals of care include to maximize quality of life and symptom management.  Visit consisted of counseling and education dealing with the complex and emotionally intense issues of symptom management and palliative care in the setting of serious and potentially life-threatening illness. Palliative care team will continue to support patient, patient's family, and medical team.  3. Symptom management/Plan:  Hypertension: stable. Continue Carvedilol and Telmisartan as ordered.  check BP regularly and report consistent outliers, chest pain, palpitations, dizziness, headaches.  Cardiologist consult as needed. CHF: Torsemide. Adhere to salt and fluid restrictions. She denies fluid overload, shortness of breath.  Type 2  DM: A1c 05/30/2020 11.7, trending down from 12.6 03/14/2020. Continue Tradjenta and [place],  Ozempic.  Repeat A1c in 2 months.  Low back/right knee pain: Voltaren gel in use.  Tylenol 500 mg 3 times daily as needed pain.  Palliative will continue to monitor for symptom management/decline and make recommendations as needed. Return2 months or prn. Encouraged to call provider sooner with any concerns.    HISTORY OF PRESENT ILLNESS:HISTORY OF PRESENT ILLNESS:Helen Richardson a 87 y.o.year oldfemalewith multiple medical problems including Hypertension, DMT2, CHF, HTN, low back pain, right knee pain.  Palliative Care was asked to help address complex decision making in the context of  goals of care/advance care planning. No report of hypoglycemic/hyper glycemic events.  She denies headaches, dizziness, palpitations, chest pain.  Patient with no acute concerns.    History obtained from review of EMR, discussion with patient/family.History obtained from review of EMR, discussion with primary team, family, caregiver  and/or patient. Records reviewed and summarized above. All 10 point systems reviewed and are negative except as documented in history of present illness above  Review and summarization of Epic records shows history from other than patient.   Palliative Care was asked to follow this patient by consultation request of Dorothyann Peng, MD to help address complex decision making in the context of advance care planning and goals of care clarification.   PPS: 50%  ROS  General: NAD, appropriately dressed Constitution: Denies fever/chills EYES: denies vision changes ENMT: denies Xerostomia,  dysphagia Cardiovascular: denies chest pain Pulmonary: denies  cough, denies dyspnea  Abdomen: endorses fair appetite, denies constipation or diarrhea GU: denies dysuria MSK:  endorses ROM limitations, no falls reported Skin: denies rashes/bruising Neurological: endorses weakness, denies pain,  denies insomnia Psych: Endorses positive mood Heme/lymph/immuno: denies bruises, no abnormal bleeding    PERTINENT MEDICATIONS:  Outpatient Encounter Medications as of 07/11/2020  Medication Sig  . Alcohol Swabs (ALCOHOL PREP) 70 % PADS   . aspirin 81 MG chewable tablet Chew 81 mg by mouth at bedtime.  . Calcium Carbonate-Vitamin D (CALTRATE 600+D PO) Take 1 tablet by mouth daily.  . carvedilol (COREG) 6.25 MG tablet TAKE 1 TABLET BY MOUTH TWICE DAILY (Patient taking differently: Take 6.25 mg by mouth 2 (two) times daily with a meal.)  . diclofenac sodium (VOLTAREN) 1 % GEL APPLY TO AFFECTED AREA 3 TIMES DAILY AS NEEDED (Patient taking differently: Apply 2 g topically 3 (three) times daily as needed (pain).)  . furosemide (LASIX) 40 MG tablet TAKE 1 TABLET(40 MG) BY MOUTH DAILY  . glucose blood test strip Test up to 4 times daily  . Insulin Pen Needle 32G X 4 MM MISC Use with insulin Dx code e11.65  . nystatin (NYSTATIN) powder Apply 1 application topically 3 (three) times daily.  . polyethylene glycol powder (GLYCOLAX/MIRALAX) 17 GM/SCOOP powder   . potassium chloride SA (KLOR-CON) 20 MEQ tablet Take 1 tablet (20 mEq total) by mouth once for 1 dose. Please d/c 2 rx for 2 times per day thanks  . rosuvastatin (CRESTOR) 10 MG tablet Take 1 tablet (10 mg total) by mouth See admin instructions. MONDAY-FRIDAY. DO NOT TAKE ON SATURDAY OR SUNDAY  . Semaglutide (OZEMPIC, 0.25 OR 0.5 MG/DOSE, [place]) Inject 0.25 mg into the skin.  [name] Kitchen telmisartan (MICARDIS) 80 MG tablet TAKE 1 TABLET(80 MG) BY MOUTH DAILY (Patient taking differently: Take 80 mg by mouth daily.)  . TRADJENTA 5 MG TABS tablet Take 5 mg by mouth daily.  . TRESIBA FLEXTOUCH 200 UNIT/ML FlexTouch Pen INJECT 36 UNITS UNDER THE SKIN DAILY. MAX TITRATION 80 UNITS (Patient taking differently: Inject 36 Units into the skin daily.)  . UNABLE TO FIND lancets 30 gauge  . UNABLE TO FIND Comfort EZ Pen Needles 32 gauge x 5/32"   No  facility-administered encounter medications on file as of 07/11/2020.    HOSPICE ELIGIBILITY/DIAGNOSIS: TBD  PAST MEDICAL HISTORY:  Past Medical History:  Diagnosis Date  . CHF (congestive heart failure) (HCC)   . Diabetes mellitus without complication (HCC)   . High cholesterol   . HTN (hypertension)   . Obesity      SOCIAL HX: @SOCX  Patient lives at home for ongoing care  FAMILY HX:  Family History  Problem Relation Age of Onset  . Diabetes Mother   . Hypertension Mother   . Hypertension Father   . Stroke Father     Review lab tests/diagnostics No results for input(s): WBC, HGB, HCT, PLT, MCV in the last 168 hours. No results for input(s): NA, K, CL, CO2, BUN, CREATININE, GLUCOSE in the last 168 hours. CrCl cannot be calculated (Patient's most recent lab result is older than the maximum 21 days allowed.).  ALLERGIES:  Allergies  Allergen Reactions  . Tradjenta [Linagliptin] Swelling      I spent 45 minutes providing this consultation; this includes time spent with patient/family, chart review and documentation. More than 50% of the time in this consultation was spent on counseling and coordinating communication   Thank you for the opportunity to participate in the care of Helen Richardson Please call our office at [phone] if we can be of additional assistance.  Note: Portions of this note were generated with [phone]. Dictation errors may occur despite best attempts at proofreading.  Scientist, clinical (histocompatibility and immunogenetics), NP

## 2020-07-16 DIAGNOSIS — E119 Type 2 diabetes mellitus without complications: Secondary | ICD-10-CM | POA: Diagnosis not present

## 2020-07-16 DIAGNOSIS — Z794 Long term (current) use of insulin: Secondary | ICD-10-CM | POA: Diagnosis not present

## 2020-07-17 ENCOUNTER — Telehealth: Payer: Self-pay

## 2020-07-17 ENCOUNTER — Telehealth: Payer: Medicare Other

## 2020-07-17 NOTE — Telephone Encounter (Signed)
  Care Management   Follow Up Note   07/17/2020 Name: Helen Richardson MRN: [id number] DOB: 1933-05-29   Referred by: Dorothyann Peng, MD Reason for referral : Chronic Care Management   An unsuccessful telephone outreach was attempted today. The patient was referred to the case management team for assistance with care management and care coordination.   Follow Up Plan: A HIPPA compliant phone message was left for the patient providing contact information and requesting a return call. The care management team will contact the patient over the next 21 days.  Bevelyn Ngo, BSW, CDP Social Worker, Certified Dementia Practitioner [facility] Care Management [phone]

## 2020-07-23 ENCOUNTER — Ambulatory Visit: Payer: Medicare Other | Admitting: Internal Medicine

## 2020-07-24 ENCOUNTER — Telehealth: Payer: Self-pay

## 2020-07-25 ENCOUNTER — Telehealth: Payer: Medicare Other

## 2020-07-25 ENCOUNTER — Ambulatory Visit: Payer: Self-pay

## 2020-07-25 DIAGNOSIS — I10 Essential (primary) hypertension: Secondary | ICD-10-CM

## 2020-07-25 DIAGNOSIS — E1122 Type 2 diabetes mellitus with diabetic chronic kidney disease: Secondary | ICD-10-CM

## 2020-07-25 DIAGNOSIS — I509 Heart failure, unspecified: Secondary | ICD-10-CM

## 2020-07-25 DIAGNOSIS — N183 Chronic kidney disease, stage 3 unspecified: Secondary | ICD-10-CM

## 2020-07-25 DIAGNOSIS — Z794 Long term (current) use of insulin: Secondary | ICD-10-CM | POA: Diagnosis not present

## 2020-07-27 ENCOUNTER — Telehealth: Payer: Medicare Other

## 2020-07-27 ENCOUNTER — Telehealth: Payer: Self-pay

## 2020-07-27 NOTE — Telephone Encounter (Signed)
  Care Management   Follow Up Note   07/27/2020 Name: Helen Richardson MRN: [id number] DOB: 25-Mar-1933   Referred by: Dorothyann Peng, MD Reason for referral : Chronic Care Management (Unsuccessful call attempt #2)   A second unsuccessful telephone outreach was attempted today. The patient was referred to the case management team for assistance with care management and care coordination. SW left a HIPAA compliant voice message requesting a return call.  Follow Up Plan: The care management team will reach out to the patient again over the next 30 days.   Bevelyn Ngo, BSW, CDP Social Worker, Certified Dementia Practitioner [facility] Care Management [phone]

## 2020-07-31 ENCOUNTER — Other Ambulatory Visit: Payer: Self-pay

## 2020-07-31 ENCOUNTER — Telehealth: Payer: Self-pay

## 2020-07-31 DIAGNOSIS — E119 Type 2 diabetes mellitus without complications: Secondary | ICD-10-CM | POA: Diagnosis not present

## 2020-07-31 MED ORDER — FUROSEMIDE 40 MG PO TABS: ORAL_TABLET | ORAL | 1 refills | Status: DC

## 2020-07-31 MED ORDER — TELMISARTAN 80 MG PO TABS: ORAL_TABLET | ORAL | 1 refills | Status: DC

## 2020-07-31 NOTE — Chronic Care Management (AMB) (Signed)
Chronic Care Management Pharmacy Assistant   Name: Helen Richardson  MRN: [id number] DOB: 26-Oct-1933   Reason for Encounter: Medication Review/ Medication coordination    Recent office visits:  07-10-2020 Bevelyn Ngo (CCM)  07-25-2020 Little, Karma Lew, RN (CCM)  Recent consult visits:  07-11-2020 Dierdre Harness I, NP (Palliative Care)    Hospital visits:  None in previous 6 months  Medications: Outpatient Encounter Medications as of 07/31/2020  Medication Sig Note  . Alcohol Swabs (ALCOHOL PREP) 70 % PADS  03/10/2013: Received from: External Pharmacy  . aspirin 81 MG chewable tablet Chew 81 mg by mouth at bedtime.   . Calcium Carbonate-Vitamin D (CALTRATE 600+D PO) Take 1 tablet by mouth daily.   . carvedilol (COREG) 6.25 MG tablet TAKE 1 TABLET BY MOUTH TWICE DAILY (Patient taking differently: Take 6.25 mg by mouth 2 (two) times daily with a meal.)   . diclofenac sodium (VOLTAREN) 1 % GEL APPLY TO AFFECTED AREA 3 TIMES DAILY AS NEEDED (Patient taking differently: Apply 2 g topically 3 (three) times daily as needed (pain).)   . furosemide (LASIX) 40 MG tablet TAKE 1 TABLET(40 MG) BY MOUTH DAILY   . glucose blood test strip Test up to 4 times daily   . Insulin Pen Needle 32G X 4 MM MISC Use with insulin Dx code e11.65   . nystatin (NYSTATIN) powder Apply 1 application topically 3 (three) times daily.   . polyethylene glycol powder (GLYCOLAX/MIRALAX) 17 GM/SCOOP powder    . potassium chloride SA (KLOR-CON) 20 MEQ tablet Take 1 tablet (20 mEq total) by mouth once for 1 dose. Please d/c 2 rx for 2 times per day thanks   . rosuvastatin (CRESTOR) 10 MG tablet Take 1 tablet (10 mg total) by mouth See admin instructions. MONDAY-FRIDAY. DO NOT TAKE ON SATURDAY OR SUNDAY   . Semaglutide (OZEMPIC, 0.25 OR 0.5 MG/DOSE, [place]) Inject 0.25 mg into the skin.   [name] Kitchen telmisartan (MICARDIS) 80 MG tablet TAKE 1 TABLET(80 MG) BY MOUTH DAILY (Patient taking differently: Take 80 mg by mouth daily.)   .  TRADJENTA 5 MG TABS tablet Take 5 mg by mouth daily.   . TRESIBA FLEXTOUCH 200 UNIT/ML FlexTouch Pen INJECT 36 UNITS UNDER THE SKIN DAILY. MAX TITRATION 80 UNITS (Patient taking differently: Inject 36 Units into the skin daily.)   . UNABLE TO FIND lancets 30 gauge   . UNABLE TO FIND Comfort EZ Pen Needles 32 gauge x 5/32"    No facility-administered encounter medications on file as of 07/31/2020.   Reviewed chart for medication changes ahead of medication coordination call.  No medication changes indicated OR if recent visit, treatment plan here.  BP Readings from Last 3 Encounters:  06/19/20 132/80  05/30/20 132/74  05/21/20 (!) 155/75    Lab Results  Component Value Date   HGBA1C 11.7 (H) 05/30/2020     Patient obtains medications through Adherence Packaging  30 Days   Last adherence delivery included:  Carvedilol 6.25 mg- 1 tablet twice daily (before breakfast and evening meal)  Telmisartan80 mg- 1 tablet daily (before breakfast) Rosuvastatin10mg - 1 tablet daily on Monday- Friday, none on Saturday or Sunday(bedtime) Potassium 20 meq- 1 tablet daily (breakfast) Furosemide 40 mg- 1 tablet daily (before breakfast) Caltrate Bone Health (calcium) 600 mg + D3- 1 tablet daily (breakfast).  Patient declined (meds) last month: Could not obtain last month  Patient is due for next adherence delivery on: 08-08-2020. Called patient and reviewed medications and coordinated delivery.  This delivery to include: Carvedilol 6.25 mg- 1 tablet twice daily (before breakfast and evening meal)  Telmisartan80 mg- 1 tablet daily (before breakfast) Rosuvastatin 20 mg- 1 tablet daily (bedtime) Potassium 20 meq- 1 tablet daily (breakfast) Furosemide 40 mg- 1 tablet daily (before breakfast) Caltrate Bone Health (calcium) 600 mg + D3- 1 tablet daily (breakfast). Aspirin 81 MG- 1 tablet at bedtime  Patient will not need a short or acute fills  Patient needs refills for: Telmisartan80  mg- 1 tablet daily (before breakfast) Furosemide 40 mg- 1 tablet daily (before breakfast)  Confirmed delivery date of 08-08-2020 advised patient that pharmacy will contact them the morning of delivery.  07-31-2020: First attempt Left VM with patient, Unable to leave VM with Lessie Dings (caregiver) [phone]. Left VM with family friend [name] [phone].  08-01-2020: Spoke with patients new aide CeeCee who comes m-f 2-5 pm [phone]. She doesn't know all her medications yet but will find out tomorrow morning. Cee Cee also will leave a note for the morning aid to call office for medication coordination.  08-02-2020: Spoke with patients morning care taker. Patient is in need of Aspirin 81 MG.   Star Rating Drugs: Rosuvastatin 10 mg-  Last filled 07-06-2020 30 DS [facility] Telmisartan 80 mg  Last filled 07-06-2020 30 [facility]  [facility] Clinical Pharmacist Assistant [phone]

## 2020-08-02 ENCOUNTER — Other Ambulatory Visit: Payer: Self-pay

## 2020-08-02 ENCOUNTER — Encounter: Payer: Self-pay | Admitting: Internal Medicine

## 2020-08-02 ENCOUNTER — Ambulatory Visit (INDEPENDENT_AMBULATORY_CARE_PROVIDER_SITE_OTHER): Payer: Medicare Other | Admitting: Internal Medicine

## 2020-08-02 VITALS — BP 122/66 | HR 92 | Temp 98.1°F | Ht 63.0 in | Wt 195.4 lb

## 2020-08-02 DIAGNOSIS — Z2821 Immunization not carried out because of patient refusal: Secondary | ICD-10-CM | POA: Diagnosis not present

## 2020-08-02 DIAGNOSIS — E1122 Type 2 diabetes mellitus with diabetic chronic kidney disease: Secondary | ICD-10-CM | POA: Diagnosis not present

## 2020-08-02 DIAGNOSIS — Z6834 Body mass index (BMI) 34.0-34.9, adult: Secondary | ICD-10-CM

## 2020-08-02 DIAGNOSIS — R413 Other amnesia: Secondary | ICD-10-CM

## 2020-08-02 DIAGNOSIS — I129 Hypertensive chronic kidney disease with stage 1 through stage 4 chronic kidney disease, or unspecified chronic kidney disease: Secondary | ICD-10-CM | POA: Diagnosis not present

## 2020-08-02 DIAGNOSIS — E6609 Other obesity due to excess calories: Secondary | ICD-10-CM | POA: Diagnosis not present

## 2020-08-02 DIAGNOSIS — N1831 Chronic kidney disease, stage 3a: Secondary | ICD-10-CM

## 2020-08-02 DIAGNOSIS — N183 Chronic kidney disease, stage 3 unspecified: Secondary | ICD-10-CM | POA: Diagnosis not present

## 2020-08-02 DIAGNOSIS — Z794 Long term (current) use of insulin: Secondary | ICD-10-CM | POA: Diagnosis not present

## 2020-08-02 LAB — POCT GLUCOSE (DEVICE FOR HOME USE): POC Glucose: 148 mg/dl — AB (ref 70–99)

## 2020-08-02 NOTE — Patient Instructions (Signed)
Diabetes Mellitus Basics  Diabetes mellitus, or diabetes, is a long-term (chronic) disease. It occurs when the body does not properly use sugar (glucose) that is released from food after you eat. Diabetes mellitus may be caused by one or both of these problems:  Your pancreas does not make enough of a hormone called insulin.  Your body does not react in a normal way to the insulin that it makes. Insulin lets glucose enter cells in your body. This gives you energy. If you have diabetes, glucose cannot get into cells. This causes high blood glucose (hyperglycemia). How to treat and manage diabetes You may need to take insulin or other diabetes medicines daily to keep your glucose in balance. If you are prescribed insulin, you will learn how to give yourself insulin by injection. You may need to adjust the amount of insulin you take based on the foods that you eat. You will need to check your blood glucose levels using a glucose monitor as told by your health care provider. The readings can help determine if you have low or high blood glucose. Generally, you should have these blood glucose levels:  Before meals (preprandial): 80-130 mg/dL (4.4-7.2 mmol/L).  After meals (postprandial): below 180 mg/dL (10 mmol/L).  Hemoglobin A1c (HbA1c) level: less than 7%. Your health care provider will set treatment goals for you. Keep all follow-up visits. This is important. Follow these instructions at home: Diabetes medicines Take your diabetes medicines every day as told by your health care provider. List your diabetes medicines here:  Name of medicine: ______________________________ ? Amount (dose): _______________ Time (a.m./p.m.): _______________ Notes: ___________________________________  Name of medicine: ______________________________ ? Amount (dose): _______________ Time (a.m./p.m.): _______________ Notes: ___________________________________  Name of medicine:  ______________________________ ? Amount (dose): _______________ Time (a.m./p.m.): _______________ Notes: ___________________________________ Insulin If you use insulin, list the types of insulin you use here:  Insulin type: ______________________________ ? Amount (dose): _______________ Time (a.m./p.m.): _______________Notes: ___________________________________  Insulin type: ______________________________ ? Amount (dose): _______________ Time (a.m./p.m.): _______________ Notes: ___________________________________  Insulin type: ______________________________ ? Amount (dose): _______________ Time (a.m./p.m.): _______________ Notes: ___________________________________  Insulin type: ______________________________ ? Amount (dose): _______________ Time (a.m./p.m.): _______________ Notes: ___________________________________  Insulin type: ______________________________ ? Amount (dose): _______________ Time (a.m./p.m.): _______________ Notes: ___________________________________ Managing blood glucose Check your blood glucose levels using a glucose monitor as told by your health care provider. Write down the times that you check your glucose levels here:  Time: _______________ Notes: ___________________________________  Time: _______________ Notes: ___________________________________  Time: _______________ Notes: ___________________________________  Time: _______________ Notes: ___________________________________  Time: _______________ Notes: ___________________________________  Time: _______________ Notes: ___________________________________   Low blood glucose Low blood glucose (hypoglycemia) is when glucose is at or below 70 mg/dL (3.9 mmol/L). Symptoms may include:  Feeling: ? Hungry. ? Sweaty and clammy. ? Irritable or easily upset. ? Dizzy. ? Sleepy.  Having: ? A fast heartbeat. ? A headache. ? A change in your vision. ? Numbness around the mouth, lips, or  tongue.  Having trouble with: ? Moving (coordination). ? Sleeping. Treating low blood glucose To treat low blood glucose, eat or drink something containing sugar right away. If you can think clearly and swallow safely, follow the 15:15 rule:  Take 15 grams of a fast-acting carb (carbohydrate), as told by your health care provider.  Some fast-acting carbs are: ? Glucose tablets: take 3-4 tablets. ? Hard candy: eat 3-5 pieces. ? Fruit juice: drink 4 oz (120 mL). ? Regular (not diet) soda: drink 4-6 oz (120-180 mL). ? Honey or sugar:   eat 1 Tbsp (15 mL).  Check your blood glucose levels 15 minutes after you take the carb.  If your glucose is still at or below 70 mg/dL (3.9 mmol/L), take 15 grams of a carb again.  If your glucose does not go above 70 mg/dL (3.9 mmol/L) after 3 tries, get help right away.  After your glucose goes back to normal, eat a meal or a snack within 1 hour. Treating very low blood glucose If your glucose is at or below 54 mg/dL (3 mmol/L), you have very low blood glucose (severe hypoglycemia). This is an emergency. Do not wait to see if the symptoms will go away. Get medical help right away. Call your local emergency services (911 in the [place] Do not drive yourself to the hospital. Questions to ask your health care provider  Should I talk with a diabetes educator?  What equipment will I need to care for myself at home?  What diabetes medicines do I need? When should I take them?  How often do I need to check my blood glucose levels?  What number can I call if I have questions?  When is my follow-up visit?  Where can I find a support group for people with diabetes? Where to find more information  American Diabetes Association: www.diabetes.org  Association of Diabetes Care and Education Specialists: www.diabeteseducator.org Contact a health care provider if:  Your blood glucose is at or above 240 mg/dL (13.3 mmol/L) for 2 days in a row.  You have  been sick or have had a fever for 2 days or more, and you are not getting better.  You have any of these problems for more than 6 hours: ? You cannot eat or drink. ? You feel nauseous. ? You vomit. ? You have diarrhea. Get help right away if:  Your blood glucose is lower than 54 mg/dL (3 mmol/L).  You get confused.  You have trouble thinking clearly.  You have trouble breathing. These symptoms may represent a serious problem that is an emergency. Do not wait to see if the symptoms will go away. Get medical help right away. Call your local emergency services (911 in the [place] Do not drive yourself to the hospital. Summary  Diabetes mellitus is a chronic disease that occurs when the body does not properly use sugar (glucose) that is released from food after you eat.  Take insulin and diabetes medicines as told.  Check your blood glucose every day, as often as told.  Keep all follow-up visits. This is important. This information is not intended to replace advice given to you by your health care provider. Make sure you discuss any questions you have with your health care provider. Document Revised: 06/21/2019 Document Reviewed: 06/21/2019 Elsevier Patient Education  2021 Elsevier Inc.  

## 2020-08-02 NOTE — Progress Notes (Signed)
I,Yamilka Roman [employer] as a Education administrator for Maximino Greenland, MD.,have documented all relevant documentation on the behalf of Maximino Greenland, MD,as directed by  Maximino Greenland, MD while in the presence of Maximino Greenland, MD. This visit occurred during the SARS-CoV-2 public health emergency.  Safety protocols were in place, including screening questions prior to the visit, additional usage of staff PPE, and extensive cleaning of exam room while observing appropriate contact time as indicated for disinfecting solutions.  Subjective:     Patient ID: Helen Richardson , female    DOB: [birth year] , 87 y.o.   MRN: [id number]   Chief Complaint  Patient presents with   Diabetes   Hypertension    HPI  Patient presents today for a DM/HTN f/u. She is accompanied by her new aide, [place]. She reports compliance with meds. Aide requests med list, she is the PM aide, morning aide is not present. Denies headaches, chest pain and shortness of breath.   Diabetes She presents for her follow-up diabetic visit. She has type 2 diabetes mellitus. There are no hypoglycemic associated symptoms. Pertinent negatives for diabetes include no blurred vision, no chest pain, no polydipsia, no polyphagia and no polyuria. There are no hypoglycemic complications. She is following a generally healthy diet. Meal planning includes avoidance of concentrated sweets. She never participates in exercise. An ACE inhibitor/angiotensin II receptor blocker is being taken.  Hypertension This is a chronic problem. The current episode started today. The problem has been gradually improving since onset. The problem is uncontrolled. Pertinent negatives include no blurred vision, chest pain, palpitations or shortness of breath. Risk factors for coronary artery disease include diabetes mellitus, dyslipidemia, obesity, post-menopausal state and sedentary lifestyle. The current treatment provides moderate improvement. Compliance problems include  exercise.     Past Medical History:  Diagnosis Date   CHF (congestive heart failure) (HCC)    Diabetes mellitus without complication (HCC)    High cholesterol    HTN (hypertension)    Obesity      Family History  Problem Relation Age of Onset   Diabetes Mother    Hypertension Mother    Hypertension Father    Stroke Father      Current Outpatient Medications:    Alcohol Swabs (ALCOHOL PREP) 70 % PADS, , Disp: , Rfl:    aspirin 81 MG chewable tablet, Chew 81 mg by mouth at bedtime., Disp: , Rfl:    Calcium Carbonate-Vitamin D (CALTRATE 600+D PO), Take 1 tablet by mouth daily., Disp: , Rfl:    carvedilol (COREG) 6.25 MG tablet, TAKE 1 TABLET BY MOUTH TWICE DAILY (Patient taking differently: Take 6.25 mg by mouth 2 (two) times daily with a meal.), Disp: 180 tablet, Rfl: 1   diclofenac sodium (VOLTAREN) 1 % GEL, APPLY TO AFFECTED AREA 3 TIMES DAILY AS NEEDED (Patient taking differently: Apply 2 g topically 3 (three) times daily as needed (pain).), Disp: 100 g, Rfl: 1   furosemide (LASIX) 40 MG tablet, TAKE 1 TABLET(40 MG) BY MOUTH DAILY, Disp: 90 tablet, Rfl: 1   glucose blood test strip, Test up to 4 times daily, Disp: 200 each, Rfl: 12   Insulin Pen Needle 32G X 4 MM MISC, Use with insulin Dx code e11.65, Disp: 100 each, Rfl: 3   nystatin (NYSTATIN) powder, Apply 1 application topically 3 (three) times daily., Disp: 15 g, Rfl: 0   polyethylene glycol powder (GLYCOLAX/MIRALAX) 17 GM/SCOOP powder, , Disp: , Rfl:    potassium chloride SA (KLOR-CON)  20 MEQ tablet, Take 1 tablet (20 mEq total) by mouth once for 1 dose. Please d/c 2 rx for 2 times per day thanks, Disp: 90 tablet, Rfl: 1   rosuvastatin (CRESTOR) 10 MG tablet, Take 1 tablet (10 mg total) by mouth See admin instructions. MONDAY-FRIDAY. DO NOT TAKE ON SATURDAY OR SUNDAY, Disp: 75 tablet, Rfl: 3   Semaglutide (OZEMPIC, 0.25 OR 0.5 MG/DOSE, [place]), Inject 0.25 mg into the skin., Disp: , Rfl:    telmisartan (MICARDIS) 80 MG tablet,  TAKE 1 TABLET(80 MG) BY MOUTH DAILY, Disp: 90 tablet, Rfl: 1   TRADJENTA 5 MG TABS tablet, Take 5 mg by mouth daily., Disp: , Rfl:    TRESIBA FLEXTOUCH 200 UNIT/ML FlexTouch Pen, INJECT 36 UNITS UNDER THE SKIN DAILY. MAX TITRATION 80 UNITS (Patient taking differently: Inject 36 Units into the skin daily.), Disp: 45 mL, Rfl: 1   UNABLE TO FIND, lancets 30 gauge, Disp: , Rfl:    UNABLE TO FIND, Comfort EZ Pen Needles 32 gauge x 5/32", Disp: , Rfl:    Allergies  Allergen Reactions   Tradjenta [Linagliptin] Swelling     Review of Systems  Constitutional: Negative.   Eyes: Negative.  Negative for blurred vision.  Respiratory: Negative.  Negative for shortness of breath.   Cardiovascular:  Negative for chest pain and palpitations.  Gastrointestinal: Negative.   Endocrine: Negative for polydipsia, polyphagia and polyuria.  Musculoskeletal: Negative.   Skin: Negative.   Psychiatric/Behavioral: Negative.      Today's Vitals   08/02/20 1435  BP: 122/66  Pulse: 92  Temp: 98.1 F (36.7 C)  TempSrc: Oral  Weight: 195 lb 6.4 oz (88.6 kg)  Height: _0  (1.6 m)  PainSc: 0-No pain   Body mass index is 34.61 kg/m.  Wt Readings from Last 3 Encounters:  08/02/20 195 lb 6.4 oz (88.6 kg)  06/19/20 202 lb (91.6 kg)  05/30/20 202 lb 12.8 oz (92 kg)     Objective:  Physical Exam Vitals and nursing note reviewed.  Constitutional:      Appearance: Normal appearance. She is obese.  HENT:     Head: Normocephalic and atraumatic.     Nose:     Comments: Masked     Mouth/Throat:     Comments: Masked  Eyes:     Extraocular Movements: Extraocular movements intact.  Cardiovascular:     Rate and Rhythm: Normal rate and regular rhythm.     Heart sounds: Normal heart sounds.  Pulmonary:     Effort: Pulmonary effort is normal.     Breath sounds: Normal breath sounds.  Musculoskeletal:     Cervical back: Normal range of motion.  Skin:    General: Skin is warm.  Neurological:     General: No  focal deficit present.     Mental Status: She is alert.  Psychiatric:        Mood and Affect: Mood normal.        Behavior: Behavior normal.        Assessment And Plan:     1. Type 2 diabetes mellitus with stage 3a chronic kidney disease, with long-term current use of insulin (HCC) Comments: Chronic, I went through med list in detail with patient/nurse aid. Importance of dietary and med compliance was d/w pt.  - BMP8+EGFR - Hemoglobin A1c - POCT Glucose (Device for Home Use)  2. Hypertensive nephropathy Comments: Chronic, well controlled. Encouraged to follow low sodium diet.   3. Class 1 obesity due to excess  calories with serious comorbidity and body mass index (BMI) of 34.0 to 34.9 in adult Comments: She is encouraged to strive for BMI less than 30 to decrease cardiac risk. Encouraged to perform chair exercises when watching TV.   4. COVID-19 vaccination declined   Patient was given opportunity to ask questions. Patient verbalized understanding of the plan and was able to repeat key elements of the plan. All questions were answered to their satisfaction.   I, Maximino Greenland, MD, have reviewed all documentation for this visit. The documentation on 08/02/20 for the exam, diagnosis, procedures, and orders are all accurate and complete.   IF YOU HAVE BEEN REFERRED TO A SPECIALIST, IT MAY TAKE 1-2 WEEKS TO SCHEDULE/PROCESS THE REFERRAL. IF YOU HAVE NOT HEARD FROM US/SPECIALIST IN TWO WEEKS, PLEASE GIVE [place] A CALL AT [phone].   THE PATIENT IS ENCOURAGED TO PRACTICE SOCIAL DISTANCING DUE TO THE COVID-19 PANDEMIC.

## 2020-08-03 LAB — BMP8+EGFR
BUN/Creatinine Ratio: 13 (ref 12–28)
BUN: 14 mg/dL (ref 8–27)
CO2: 24 mmol/L (ref 20–29)
Calcium: 9.5 mg/dL (ref 8.7–10.3)
Chloride: 98 mmol/L (ref 96–106)
Creatinine, Ser: 1.12 mg/dL — ABNORMAL HIGH (ref 0.57–1.00)
Glucose: 137 mg/dL — ABNORMAL HIGH (ref 65–99)
Potassium: 4.9 mmol/L (ref 3.5–5.2)
Sodium: 135 mmol/L (ref 134–144)
eGFR: 48 mL/min/{1.73_m2} — ABNORMAL LOW (ref >59)

## 2020-08-03 LAB — HEMOGLOBIN A1C
Est. average glucose Bld gHb Est-mCnc: 266 mg/dL
Hgb A1c MFr Bld: 10.9 % — ABNORMAL HIGH (ref 4.8–5.6)

## 2020-08-06 ENCOUNTER — Telehealth: Payer: Self-pay

## 2020-08-06 NOTE — Telephone Encounter (Signed)
-----   Message from Dorothyann Peng, MD sent at 08/04/2020 10:44 PM EDT ----- Clarification: call every Monday/Th with am sugars

## 2020-08-06 NOTE — Telephone Encounter (Signed)
Left the patient a message to call back for lab results. 

## 2020-08-06 NOTE — Progress Notes (Signed)
Patient's evening aid called in reference to patient's Ozempic. Patient assistance application was mailed to patient on 06-11-2020 to be completed and dropped off at Dr. Allyne Gee office. Contacted [employer] patient assistance and was informed that application was never fax/mailed in. Informed patient's aid that she will need a new application completed and [name] PTM will see if there's any Ozempic samples in the office on 08-07-2020.  Huey Romans [facility] Clinical Pharmacist Assistant [phone]

## 2020-08-10 NOTE — Patient Instructions (Signed)
Goals Addressed      Monitor and Manage My Blood Sugar-Diabetes Type 2   On track    Timeframe:  Long-Range Goal Priority:  High Start Date: 06/18/20                            Expected End Date: 06/18/21                     Follow Up Date: 11/19/20   - check blood sugar at prescribed times - check blood sugar if I feel it is too high or too low - enter blood sugar readings and medication or insulin into daily log - take the blood sugar log to all doctor visits - take the blood sugar meter to all doctor visits    Why is this important?   Checking your blood sugar at home helps to keep it from getting very high or very low.  Writing the results in a diary or log helps the doctor know how to care for you.  Your blood sugar log should have the time, date and the results.  Also, write down the amount of insulin or other medicine that you take.  Other information, like what you ate, exercise done and how you were feeling, will also be helpful.     Notes:       Obtain Eye Exam-Diabetes Type 2       Timeframe:  Long-Range Goal Priority:  Medium Start Date: 06/18/20                            Expected End Date: 06/18/21                       Follow Up Date: 11/19/20    - keep appointment with eye doctor - schedule appointment with eye doctor    Why is this important?   Eye check-ups are important when you have diabetes.  Vision loss can be prevented.    Notes:       Perform Foot Care-Diabetes Type 2       Timeframe:  Long-Range Goal Priority:  Medium Start Date: 06/18/20                           Expected End Date: 06/18/21                     Follow Up Date: 11/19/20   - check feet daily for cuts, sores or redness - keep feet up while sitting - trim toenails straight across - wash and dry feet carefully every day - wear comfortable, cotton socks - wear comfortable, well-fitting shoes    Why is this important?   Good foot care is very important when you have diabetes.   There are many things you can do to keep your feet healthy and catch a problem early.    Notes:

## 2020-08-10 NOTE — Chronic Care Management (AMB) (Addendum)
Chronic Care Management   CCM RN Visit Note  0525/2022 Name: Helen Richardson MRN: [id number] DOB: 1933/11/26  Subjective: Helen Richardson is a 87 y.o. year old female who is a primary care patient of Glendale Chard, MD. The care management team was consulted for assistance with disease management and care coordination needs.    Engaged with patient by telephone for follow up visit in response to provider referral for case management and/or care coordination services.   Consent to Services:  The patient was given information about Chronic Care Management services, agreed to services, and gave verbal consent prior to initiation of services.  Please see initial visit note for detailed documentation.   Patient agreed to services and verbal consent obtained.   Assessment: Review of patient past medical history, allergies, medications, health status, including review of consultants reports, laboratory and other test data, was performed as part of comprehensive evaluation and provision of chronic care management services.   SDOH (Social Determinants of Health) assessments and interventions performed:  Yes, no acute challenges   CCM Care Plan  Allergies  Allergen Reactions   Tradjenta [Linagliptin] Swelling    Outpatient Encounter Medications as of 07/25/2020  Medication Sig Note   Alcohol Swabs (ALCOHOL PREP) 70 % PADS  03/10/2013: Received from: External Pharmacy   aspirin 81 MG chewable tablet Chew 81 mg by mouth at bedtime.    Calcium Carbonate-Vitamin D (CALTRATE 600+D PO) Take 1 tablet by mouth daily.    carvedilol (COREG) 6.25 MG tablet TAKE 1 TABLET BY MOUTH TWICE DAILY (Patient taking differently: Take 6.25 mg by mouth 2 (two) times daily with a meal.)    diclofenac sodium (VOLTAREN) 1 % GEL APPLY TO AFFECTED AREA 3 TIMES DAILY AS NEEDED (Patient taking differently: Apply 2 g topically 3 (three) times daily as needed (pain).)    glucose blood test strip Test up to 4 times daily     Insulin Pen Needle 32G X 4 MM MISC Use with insulin Dx code e11.65    nystatin (NYSTATIN) powder Apply 1 application topically 3 (three) times daily.    polyethylene glycol powder (GLYCOLAX/MIRALAX) 17 GM/SCOOP powder     rosuvastatin (CRESTOR) 10 MG tablet Take 1 tablet (10 mg total) by mouth See admin instructions. MONDAY-FRIDAY. DO NOT TAKE ON SATURDAY OR SUNDAY    Semaglutide (OZEMPIC, 0.25 OR 0.5 MG/DOSE, [place]) Inject 0.25 mg into the skin.    TRADJENTA 5 MG TABS tablet Take 5 mg by mouth daily.    TRESIBA FLEXTOUCH 200 UNIT/ML FlexTouch Pen INJECT 36 UNITS UNDER THE SKIN DAILY. MAX TITRATION 80 UNITS (Patient taking differently: Inject 36 Units into the skin daily.)    UNABLE TO FIND lancets 30 gauge    UNABLE TO FIND Comfort EZ Pen Needles 32 gauge x 5/32"    [DISCONTINUED] furosemide (LASIX) 40 MG tablet TAKE 1 TABLET(40 MG) BY MOUTH DAILY    [DISCONTINUED] telmisartan (MICARDIS) 80 MG tablet TAKE 1 TABLET(80 MG) BY MOUTH DAILY (Patient taking differently: Take 80 mg by mouth daily.)    No facility-administered encounter medications on file as of 07/25/2020.    Patient Active Problem List   Diagnosis Date Noted   Controlled type 2 diabetes mellitus with stable proliferative retinopathy of both eyes, with long-term current use of insulin ([place]) 07/04/2019   Intermediate stage nonexudative age-related macular degeneration of both eyes 07/04/2019   Posterior vitreous detachment of right eye 07/04/2019   Degenerative retinal drusen of both eyes 07/04/2019   Chorioretinal  scar 07/04/2019   Pincer nail deformity 04/12/2019   AKI (acute kidney injury) ([place]) 06/11/2018   Sepsis due to gram-negative UTI ([place]) 06/11/2018   Acute lower UTI (urinary tract infection) 06/10/2018   HTN (hypertension) 06/10/2018   Joint swelling 06/10/2018   Hyperkalemia 06/10/2018   Hyponatremia 06/10/2018   Normocytic anemia 06/10/2018   Type 2 diabetes mellitus with stage 3 chronic kidney disease, with  long-term current use of insulin ([name]) 03/08/2018   Parenchymal renal hypertension 03/08/2018   Pure hypercholesterolemia 03/08/2018   Class 3 severe obesity due to excess calories with serious comorbidity and body mass index (BMI) of 40.0 to 44.9 in adult [facility]) 03/08/2018    Conditions to be addressed/monitored: DM, HTN, s/p fall, Medication Management, CHF  Care Plan : Diabetes Type 2 (Adult)  Updates made by Lynne Logan, RN since 07/25/2020 12:00 AM     Problem: Glycemic Management (Diabetes, Type 2)   Priority: High     Long-Range Goal: Glycemic Management Optimized   Start Date: 06/18/2020  Expected End Date: 06/18/2021  Recent Progress: On track  Priority: High  Note:   Objective:  Lab Results  Component Value Date   HGBA1C 10.9 (H) 08/02/2020   Lab Results  Component Value Date   CREATININE 1.12 (H) 08/02/2020   CREATININE 1.04 (H) 05/20/2020   CREATININE 1.01 (H) 03/14/2020   Lab Results  Component Value Date   EGFR 48 (L) 08/02/2020  No results found for: EGFR Current Barriers:  Knowledge Deficits related to basic Diabetes pathophysiology and self care/management Knowledge Deficits related to medications used for management of diabetes Case Manager Clinical Goal(s):  patient will demonstrate improved adherence to prescribed treatment plan for diabetes self care/management as evidenced by: daily monitoring and recording of CBG  adherence to ADA/ carb modified diet exercise 5 days/week adherence to prescribed medication regimen contacting provider for new or worsened symptoms or questions Interventions:  07/25/20 completed successful outbound call with patient  Collaboration with Glendale Chard, MD regarding development and update of comprehensive plan of care as evidenced by provider attestation and co-signature Inter-disciplinary care team collaboration (see longitudinal plan of care) Provided education to patient about basic DM disease process Review of  patient status, including review of consultants reports, relevant laboratory and other test results, and medications completed. Reviewed medications with patient and discussed importance of medication adherence Determined patient is independently self administering her medications with the supervision of a caregiver at times Assessed for adherence to self monitoring cbg's and patient states she checks her sugars 2-3 times a day before meals Educated patient on dietary and exercise recommendations; daily glycemic control FBS 80-130, <180 after meals;15'15' rule Advised patient, providing education and rationale, to check cbg daily before meals and at bedtime and record, calling the PCP and or CCM RN for findings outside established parameters.   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  Discussed plans with patient for ongoing care management follow up and provided patient with direct contact information for care management team Self-Care Activities Self administers oral medications as prescribed Self administers insulin as prescribed Attends all scheduled provider appointments Checks blood sugars as prescribed and utilize hyper and hypoglycemia protocol as needed Adheres to prescribed ADA/carb modified Patient Goals: - check blood sugar at prescribed times - check blood sugar if I feel it is too high or too low - enter blood sugar readings and medication or insulin into daily log - take the blood sugar log to all doctor visits -  take the blood sugar meter to all doctor visits - keep appointment with eye doctor - schedule appointment with eye doctor - check feet daily for cuts, sores or redness - keep feet up while sitting - trim toenails straight across - wash and dry feet carefully every day - wear comfortable, cotton socks - wear comfortable, well-fitting shoes  Follow Up Plan: Telephone follow up appointment with care management team member scheduled for:  11/19/20     Plan:Telephone follow up appointment with care management team member scheduled for:  11/19/20  Barb Merino, RN, BSN, CCM Care Management Coordinator [facility]  Direct Phone: [phone]

## 2020-08-13 ENCOUNTER — Telehealth: Payer: Self-pay

## 2020-08-13 NOTE — Telephone Encounter (Signed)
Spoke with patient about medication  

## 2020-08-15 ENCOUNTER — Telehealth: Payer: Self-pay | Admitting: Internal Medicine

## 2020-08-15 NOTE — Telephone Encounter (Signed)
Left message for patient to call back and schedule Medicare Annual Wellness Visit (AWV) either virtually or in office.   Last AWV 03/01/19 please schedule at anytime with [facility]    This should be a 45 minute visit.

## 2020-08-22 ENCOUNTER — Telehealth: Payer: Self-pay

## 2020-08-22 NOTE — Telephone Encounter (Signed)
The pt was told that Ramen the NP wants the pt to take ozempic 0.25 once a week, I returned the call that Ms. [name] made yesterday, I spoke with the pt and Ms, Selena Batten her CNA

## 2020-08-27 ENCOUNTER — Ambulatory Visit (INDEPENDENT_AMBULATORY_CARE_PROVIDER_SITE_OTHER): Payer: Medicare Other

## 2020-08-27 DIAGNOSIS — N1831 Chronic kidney disease, stage 3a: Secondary | ICD-10-CM | POA: Diagnosis not present

## 2020-08-27 DIAGNOSIS — E1122 Type 2 diabetes mellitus with diabetic chronic kidney disease: Secondary | ICD-10-CM

## 2020-08-27 DIAGNOSIS — Z794 Long term (current) use of insulin: Secondary | ICD-10-CM | POA: Diagnosis not present

## 2020-08-27 DIAGNOSIS — I10 Essential (primary) hypertension: Secondary | ICD-10-CM | POA: Diagnosis not present

## 2020-08-27 DIAGNOSIS — I509 Heart failure, unspecified: Secondary | ICD-10-CM | POA: Diagnosis not present

## 2020-08-27 NOTE — Chronic Care Management (AMB) (Signed)
Chronic Care Management    Social Work Note  08/27/2020 Name: Helen Richardson MRN: [id number] DOB: [birth year]  Helen Richardson is a 87 y.o. year old female who is a primary care patient of Dorothyann Peng, MD. The CCM team was consulted to assist the patient with chronic disease management and/or care coordination needs related to:  DM, HTN, and CHF .   Engaged with patient by telephone for follow up visit in response to provider referral for social work chronic care management and care coordination services.   Consent to Services:  The patient was given information about Chronic Care Management services, agreed to services, and gave verbal consent prior to initiation of services.  Please see initial visit note for detailed documentation.   Patient agreed to services and consent obtained.   Assessment: Review of patient past medical history, allergies, medications, and health status, including review of relevant consultants reports was performed today as part of a comprehensive evaluation and provision of chronic care management and care coordination services.     SDOH (Social Determinants of Health) assessments and interventions performed:    Advanced Directives Status: Not addressed in this encounter.  CCM Care Plan  Allergies  Allergen Reactions   Tradjenta [Linagliptin] Swelling    Outpatient Encounter Medications as of 08/27/2020  Medication Sig Note   Alcohol Swabs (ALCOHOL PREP) 70 % PADS  03/10/2013: Received from: External Pharmacy   aspirin 81 MG chewable tablet Chew 81 mg by mouth at bedtime.    Calcium Carbonate-Vitamin D (CALTRATE 600+D PO) Take 1 tablet by mouth daily.    carvedilol (COREG) 6.25 MG tablet TAKE 1 TABLET BY MOUTH TWICE DAILY (Patient taking differently: Take 6.25 mg by mouth 2 (two) times daily with a meal.)    diclofenac sodium (VOLTAREN) 1 % GEL APPLY TO AFFECTED AREA 3 TIMES DAILY AS NEEDED (Patient taking differently: Apply 2 g topically 3 (three) times  daily as needed (pain).)    furosemide (LASIX) 40 MG tablet TAKE 1 TABLET(40 MG) BY MOUTH DAILY    glucose blood test strip Test up to 4 times daily    Insulin Pen Needle 32G X 4 MM MISC Use with insulin Dx code e11.65    nystatin (NYSTATIN) powder Apply 1 application topically 3 (three) times daily.    polyethylene glycol powder (GLYCOLAX/MIRALAX) 17 GM/SCOOP powder     potassium chloride SA (KLOR-CON) 20 MEQ tablet Take 1 tablet (20 mEq total) by mouth once for 1 dose. Please d/c 2 rx for 2 times per day thanks    rosuvastatin (CRESTOR) 10 MG tablet Take 1 tablet (10 mg total) by mouth See admin instructions. MONDAY-FRIDAY. DO NOT TAKE ON SATURDAY OR SUNDAY    Semaglutide (OZEMPIC, 0.25 OR 0.5 MG/DOSE, [place]) Inject 0.25 mg into the skin.    telmisartan (MICARDIS) 80 MG tablet TAKE 1 TABLET(80 MG) BY MOUTH DAILY    TRADJENTA 5 MG TABS tablet Take 5 mg by mouth daily.    TRESIBA FLEXTOUCH 200 UNIT/ML FlexTouch Pen INJECT 36 UNITS UNDER THE SKIN DAILY. MAX TITRATION 80 UNITS (Patient taking differently: Inject 36 Units into the skin daily.)    UNABLE TO FIND lancets 30 gauge    UNABLE TO FIND Comfort EZ Pen Needles 32 gauge x 5/32"    No facility-administered encounter medications on file as of 08/27/2020.    Patient Active Problem List   Diagnosis Date Noted   Controlled type 2 diabetes mellitus with stable proliferative retinopathy of both eyes, with  long-term current use of insulin ([facility]) 07/04/2019   Intermediate stage nonexudative age-related macular degeneration of both eyes 07/04/2019   Posterior vitreous detachment of right eye 07/04/2019   Degenerative retinal drusen of both eyes 07/04/2019   Chorioretinal scar 07/04/2019   Pincer nail deformity 04/12/2019   AKI (acute kidney injury) ([facility]) 06/11/2018   Sepsis due to gram-negative UTI (HCC) 06/11/2018   Acute lower UTI (urinary tract infection) 06/10/2018   HTN (hypertension) 06/10/2018   Joint swelling 06/10/2018   Hyperkalemia  06/10/2018   Hyponatremia 06/10/2018   Normocytic anemia 06/10/2018   Type 2 diabetes mellitus with stage 3 chronic kidney disease, with long-term current use of insulin ([facility]) 03/08/2018   Hypertensive nephropathy 03/08/2018   Pure hypercholesterolemia 03/08/2018   Class 1 obesity due to excess calories with serious comorbidity and body mass index (BMI) of 34.0 to 34.9 in adult 03/08/2018    Conditions to be addressed/monitored: CHF, HTN, and DMII; ADL IADL limitations  Care Plan : Social Work Care Plan  Updates made by Bevelyn Ngo since 08/27/2020 12:00 AM     Problem: Care Coordination      Long-Range Goal: Collaborate with RN Care Manager to perform appropriate assessments to assist with care coordination needs   Start Date: 02/02/2020  Expected End Date: 12/25/2020  Recent Progress: On track  Priority: Low  Note:   Current Barriers:  Chronic disease management support and education needs related to CHF, HTN, and DMII  Transportation and Limited access to caregiver  Social Worker Clinical Goal(s):   patient will work with SW to identify and address any acute and/or chronic care coordination needs related to the self health management of CHF, HTN, and DMII   CCM SW Interventions:  Current Barriers:  1:1 collaboration with Dorothyann Peng, MD regarding development and update of comprehensive plan of care as evidenced by provider attestation and co-signature Inter-disciplinary care team collaboration (see longitudinal plan of care) Successful outbound call placed to the patient to assess for care coordination needs- spoke with the patient and her morning caregiver [name] Discussed SW was contacted by pharmacy team indicating patient had contacted Huey Romans, CMA on the afternoon of 07/09/20 requesting assistance with placement into Assisted Living Assessed for patient interest in placement at this time Patient denies requesting assistance with placement and stated someone else  must have called because she did not Confirmed patient still has assistance in the home - patient indicates she has two caregivers arranged by family members including [place]. Patient also has a caregiver provided by the [facility] in home aid program Discussed the patients desire to remain in her home and is not interested in placement Scheduled follow up call over the next 90 days Encouraged the patient to contact SW as needed prior to next scheduled call  Patient Goals/Self-Care Activities  patient will:   - Attend all scheduled provider appointments -Call pharmacy for medication refills -Call provider office for new concerns or questions -Contact SW as needed prior to next scheduled call  Follow Up Plan: SW will follow up with the patient over the next 90 days       Follow Up Plan: SW will follow up with patient by phone over the next 90 days      Bevelyn Ngo, BSW, CDP Social Worker, Certified Dementia Practitioner [facility] Care Management [phone]  Total time spent performing care coordination and/or care management activities with the patient by phone or face to face = 40 minutes.

## 2020-08-27 NOTE — Patient Instructions (Signed)
Social Worker Visit Information  Goals we discussed today:   Goals Addressed             This Visit's Progress    Work with SW to manage care coordination needs       Timeframe:  Long-Range Goal Priority:  Low Start Date:  12.2.21                           Expected End Date:   10.25.22                    Next planned outreach: 9.27.22  Patient Goals/Self-Care Activities Over the next 60 days, patient will:   - Attend all scheduled provider appointments -Call pharmacy for medication refills -Call provider office for new concerns or questions -Contact SW as needed prior to next scheduled call          Follow Up Plan: SW will follow up with patient by phone over the next 90 days   Bevelyn Ngo, BSW, CDP Social Worker, Certified Dementia Practitioner [facility] Care Management [phone]

## 2020-08-28 ENCOUNTER — Telehealth: Payer: Self-pay

## 2020-08-28 NOTE — Chronic Care Management (AMB) (Addendum)
Called/texted both patient's morning and evening aid to clarify if patient received patient assistance application by mail for ozempic. Was unable to leave voicemail for both aids but sent text messages. Evening aid CeeCee called back and stated she no longer works with patient but does have a family friend name [name] number [phone]). Spoke with [name] and she said she will look for it today.Called patient back and spoke with morning aid [name] and she stated she doesn't deal with paper work and her shift has ended.    Huey Romans [facility] Clinical Pharmacist Assistant [phone]

## 2020-08-29 ENCOUNTER — Telehealth: Payer: Self-pay

## 2020-08-29 NOTE — Chronic Care Management (AMB) (Signed)
Chronic Care Management Pharmacy Assistant   Name: Helen Richardson  MRN: [id number] DOB: 1934/02/10   Reason for Encounter: Medication Review/ Medication Coordination  Recent office visits:  08-02-2020 Glendale Chard, MD. [name] Glucose= 137, Creatinine= 1.12, eGFR= 48. A1C= 10.9. POCT Glucose (Device for Home Use)= 148.  08-27-2020 Daneen Schick (CCM)  Recent consult visits:  None  Hospital visits:  None in previous 6 months  Medications: Outpatient Encounter Medications as of 08/29/2020  Medication Sig Note   Alcohol Swabs (ALCOHOL PREP) 70 % PADS  03/10/2013: Received from: External Pharmacy   aspirin 81 MG chewable tablet Chew 81 mg by mouth at bedtime.    Calcium Carbonate-Vitamin D (CALTRATE 600+D PO) Take 1 tablet by mouth daily.    carvedilol (COREG) 6.25 MG tablet TAKE 1 TABLET BY MOUTH TWICE DAILY (Patient taking differently: Take 6.25 mg by mouth 2 (two) times daily with a meal.)    diclofenac sodium (VOLTAREN) 1 % GEL APPLY TO AFFECTED AREA 3 TIMES DAILY AS NEEDED (Patient taking differently: Apply 2 g topically 3 (three) times daily as needed (pain).)    furosemide (LASIX) 40 MG tablet TAKE 1 TABLET(40 MG) BY MOUTH DAILY    glucose blood test strip Test up to 4 times daily    Insulin Pen Needle 32G X 4 MM MISC Use with insulin Dx code e11.65    nystatin (NYSTATIN) powder Apply 1 application topically 3 (three) times daily.    polyethylene glycol powder (GLYCOLAX/MIRALAX) 17 GM/SCOOP powder     potassium chloride SA (KLOR-CON) 20 MEQ tablet Take 1 tablet (20 mEq total) by mouth once for 1 dose. Please d/c 2 rx for 2 times per day thanks    rosuvastatin (CRESTOR) 10 MG tablet Take 1 tablet (10 mg total) by mouth See admin instructions. MONDAY-FRIDAY. DO NOT TAKE ON SATURDAY OR SUNDAY    Semaglutide (OZEMPIC, 0.25 OR 0.5 MG/DOSE, [place]) Inject 0.25 mg into the skin.    telmisartan (MICARDIS) 80 MG tablet TAKE 1 TABLET(80 MG) BY MOUTH DAILY    TRADJENTA 5 MG TABS tablet  Take 5 mg by mouth daily.    TRESIBA FLEXTOUCH 200 UNIT/ML FlexTouch Pen INJECT 36 UNITS UNDER THE SKIN DAILY. MAX TITRATION 80 UNITS (Patient taking differently: Inject 36 Units into the skin daily.)    UNABLE TO FIND lancets 30 gauge    UNABLE TO FIND Comfort EZ Pen Needles 32 gauge x 5/32"    No facility-administered encounter medications on file as of 08/29/2020.   Reviewed chart for medication changes ahead of medication coordination call.  No OVs, Consults, or hospital visits since last care coordination call/Pharmacist visit. (If appropriate, list visit date, provider name)  No medication changes indicated OR if recent visit, treatment plan here.  BP Readings from Last 3 Encounters:  08/02/20 122/66  06/19/20 132/80  05/30/20 132/74    Lab Results  Component Value Date   HGBA1C 10.9 (H) 08/02/2020     Patient obtains medications through Adherence Packaging  30 Days   Last adherence delivery included:  Carvedilol 6.25 mg- 1 tablet twice daily (before breakfast and evening meal) Telmisartan 80 mg - 1 tablet daily (before breakfast)  Rosuvastatin 20 mg- 1 tablet daily (bedtime) Potassium 20 meq- 1 tablet daily (breakfast) Furosemide 40 mg- 1 tablet daily (before breakfast) Caltrate Bone Health (calcium) 600 mg + D3- 1 tablet daily (breakfast).  Aspirin 81 MG- 1 tablet at bedtime  Patient declined (meds) last month:  None  Patient is  due for next adherence delivery on: 09-06-2020  Called patient and reviewed medications and coordinated delivery.  This delivery to include: Carvedilol 6.25 mg- 1 tablet twice daily (before breakfast and evening meal) Telmisartan 80 mg - 1 tablet daily (before breakfast)  Rosuvastatin 20 mg- 1 tablet daily (bedtime) Potassium 20 meq- 1 tablet daily (breakfast) Furosemide 40 mg- 1 tablet daily (before breakfast) Caltrate Bone Health (calcium) 600 mg + D3- 1 tablet daily (breakfast).  Aspirin 81 MG- 1 tablet at bedtime  No acute/ short  fill needed   Patient declined the following medications (meds) due to (reason): None  Patient needs refills for: None  Confirmed delivery date of 09-06-2020, advised patient that pharmacy will contact them the morning of delivery.  NOTES: Spoke with patient on 08-31-2020 at 10:30. Patient stated she was still in the bed because her caregiver hasn't showed up yet. Patient got out of bed herself with her walker to get a pen and paper to write down her telephone appointment with Orlando Penner CPP on 09-04-2020 at 2:45 and medication delivery for 09-06-2020 after 3:00. Patient stated she needs the same medications from last delivery.  Star Rating Drugs: Rosuvastatin 10 mg- Last filled 08-02-2020 30 DS [facility] Telmisartan 80 MG- Last filled 08-02-2020 30 [facility] Ozempic 0.5 mg- Samples (In the process of Patient assistance)  [place] Pharmacist Assistant [phone]

## 2020-08-30 DIAGNOSIS — E119 Type 2 diabetes mellitus without complications: Secondary | ICD-10-CM | POA: Diagnosis not present

## 2020-09-04 ENCOUNTER — Telehealth: Payer: Medicare Other

## 2020-09-12 ENCOUNTER — Telehealth: Payer: Self-pay | Admitting: Internal Medicine

## 2020-09-12 NOTE — Telephone Encounter (Signed)
Left message for patient to call back and schedule Medicare Annual Wellness Visit (AWV) either virtually or in office.   Last AWV 03/01/2019  please schedule at anytime with [facility]    This should be a 45 minute visit.

## 2020-09-20 ENCOUNTER — Telehealth: Payer: Self-pay

## 2020-09-20 NOTE — Chronic Care Management (AMB) (Addendum)
09-20-2020: Received a message from a pharmacist from [facility] team stating patient's nurse [facility] called in reference to patient's Ozempic injection. Contacted [name] and she has left patient's house but stated she will try and look for Patient assistance application that was mailed out about a month ago and if she can't find it she will contact me to print another one off and she will come by the office to pick it up.  09-28-2020: Called patient's nurse to follow up with patient assistance application that was mailed off. Patient's nurse stated they were unable to find it. Informed patient's nurse that another copy will be mailed off next week and we will need income verification and patient's identification card. Patient's nurse states she will be on a look out for it and will be able to drop everything off once completed.  Huey Romans [facility] Clinical Pharmacist Assistant [phone]

## 2020-10-01 ENCOUNTER — Telehealth: Payer: Self-pay

## 2020-10-01 DIAGNOSIS — E1165 Type 2 diabetes mellitus with hyperglycemia: Secondary | ICD-10-CM | POA: Diagnosis not present

## 2020-10-01 NOTE — Chronic Care Management (AMB) (Signed)
Chronic Care Management Pharmacy Assistant   Name: Helen Richardson  MRN: [id number] DOB: 17-Dec-1933  Reason for Encounter: Medication Review/ Medication Coordination.  Recent office visits:  None  Recent consult visits:  None  Hospital visits:  Medication Reconciliation was completed by comparing discharge summary, patient's EMR and Pharmacy list, and upon discussion with patient.  Admitted to the hospital on 05-20-2020 due to near syncope.  Discharge date was 05-21-2020.  Discharged from [facility].    New?Medications Started at [facility] Discharge:?? None  Medication Changes at Hospital Discharge: None  Medications Discontinued at Hospital Discharge: None  Medications that remain the same after Hospital Discharge:??  -All other medications will remain the same.    Medications: Outpatient Encounter Medications as of 10/01/2020  Medication Sig Note   Alcohol Swabs (ALCOHOL PREP) 70 % PADS  03/10/2013: Received from: External Pharmacy   aspirin 81 MG chewable tablet Chew 81 mg by mouth at bedtime.    Calcium Carbonate-Vitamin D (CALTRATE 600+D PO) Take 1 tablet by mouth daily.    carvedilol (COREG) 6.25 MG tablet TAKE 1 TABLET BY MOUTH TWICE DAILY (Patient taking differently: Take 6.25 mg by mouth 2 (two) times daily with a meal.)    diclofenac sodium (VOLTAREN) 1 % GEL APPLY TO AFFECTED AREA 3 TIMES DAILY AS NEEDED (Patient taking differently: Apply 2 g topically 3 (three) times daily as needed (pain).)    furosemide (LASIX) 40 MG tablet TAKE 1 TABLET(40 MG) BY MOUTH DAILY    glucose blood test strip Test up to 4 times daily    Insulin Pen Needle 32G X 4 MM MISC Use with insulin Dx code e11.65    nystatin (NYSTATIN) powder Apply 1 application topically 3 (three) times daily.    polyethylene glycol powder (GLYCOLAX/MIRALAX) 17 GM/SCOOP powder     potassium chloride SA (KLOR-CON) 20 MEQ tablet Take 1 tablet (20 mEq total) by mouth once for 1 dose. Please d/c 2 rx for 2  times per day thanks    rosuvastatin (CRESTOR) 10 MG tablet Take 1 tablet (10 mg total) by mouth See admin instructions. MONDAY-FRIDAY. DO NOT TAKE ON SATURDAY OR SUNDAY    Semaglutide (OZEMPIC, 0.25 OR 0.5 MG/DOSE, ) Inject 0.25 mg into the skin.    telmisartan (MICARDIS) 80 MG tablet TAKE 1 TABLET(80 MG) BY MOUTH DAILY    TRADJENTA 5 MG TABS tablet Take 5 mg by mouth daily.    TRESIBA FLEXTOUCH 200 UNIT/ML FlexTouch Pen INJECT 36 UNITS UNDER THE SKIN DAILY. MAX TITRATION 80 UNITS (Patient taking differently: Inject 36 Units into the skin daily.)    UNABLE TO FIND lancets 30 gauge    UNABLE TO FIND Comfort EZ Pen Needles 32 gauge x 5/32"    No facility-administered encounter medications on file as of 10/01/2020.   Reviewed chart for medication changes ahead of medication coordination call.  No OVs, Consults, or hospital visits since last care coordination call/Pharmacist visit. (If appropriate, list visit date, provider name)  No medication changes indicated OR if recent visit, treatment plan here.  BP Readings from Last 3 Encounters:  08/02/20 122/66  06/19/20 132/80  05/30/20 132/74    Lab Results  Component Value Date   HGBA1C 10.9 (H) 08/02/2020     Patient obtains medications through Adherence Packaging  30 Days   Last adherence delivery included:  Carvedilol 6.25 mg- 1 tablet twice daily (before breakfast and evening meal) Telmisartan 80 mg - 1 tablet daily (before breakfast)  Rosuvastatin  20 mg- 1 tablet daily (bedtime) Potassium 20 meq- 1 tablet daily (breakfast) Furosemide 40 mg- 1 tablet daily (before breakfast) Caltrate Bone Health (calcium) 600 mg + D3- 1 tablet daily (breakfast).  Aspirin 81 mg- 1 tablet at bedtime Pen needles  Patient declined (meds) last month: None  Patient is due for next adherence delivery on: 10-08-2020  Called patient and reviewed medications and coordinated delivery.  This delivery to include:  No short/acute fill  needed  Patient declined the following medications: None  Patient needs refills for: None  Confirmed delivery date of 10-08-2020 advised patient that pharmacy will contact them the morning of delivery.   10-01-2020: 1st attempt. Left VM on home number and nurse [name]. 10-02-2020: spoke with nurse [name] who confirmed medications needed. Also explained to her that application was mailed out 4 times and it will be best if someone comes by the office to get patient assistance paper work and sample of Ozempic. Nurse stated patient is in charge of her mail and probably throws it out. Nurse will like a reminder call today around 2:30 and also stated if she can't come by today she will let patients daughter inlaw Helen Richardson know to stop by.   Care Gaps: Tdap overdue  Shingrix overdue PNA vac overdue Ophthalmology overdue Yearly foot exam overdue Flu vaccine overdue Covid vaccine overdue RAF= 2.104% Medicare wellness overdue  Star Rating Drugs: Rosuvastatin 10 mg- Last filled 08-31-2020 30 Ds Upstream Telmisartan 80 MG- Last filled 08-31-2020 30 Ds Upstream Ozempic 0.5 mg- Samples (In the process of Patient assistance)  Huey Romans [facility] Clinical Pharmacist Assistant [phone]

## 2020-10-15 ENCOUNTER — Telehealth: Payer: Self-pay | Admitting: Internal Medicine

## 2020-10-15 NOTE — Telephone Encounter (Signed)
Left message for patient to call back and schedule Medicare Annual Wellness Visit (AWV) either virtually or in office.   Left both my jabber number [phone] and office number    Last AWV 03/01/19  please schedule at anytime with [facility]    This should be a 45 minute visit.

## 2020-10-19 DIAGNOSIS — E119 Type 2 diabetes mellitus without complications: Secondary | ICD-10-CM | POA: Diagnosis not present

## 2020-10-19 DIAGNOSIS — Z794 Long term (current) use of insulin: Secondary | ICD-10-CM | POA: Diagnosis not present

## 2020-10-29 ENCOUNTER — Telehealth: Payer: Self-pay

## 2020-10-29 NOTE — Chronic Care Management (AMB) (Signed)
Chronic Care Management Pharmacy Assistant   Name: Helen Richardson  MRN: [id number] DOB: 02/23/1934   Reason for Encounter: Medication Review/ Medication coordination  Recent office visits:  None  Recent consult visits:  None  Hospital visits:  None in previous 6 months  Medications: Outpatient Encounter Medications as of 10/29/2020  Medication Sig Note   Alcohol Swabs (ALCOHOL PREP) 70 % PADS  03/10/2013: Received from: External Pharmacy   aspirin 81 MG chewable tablet Chew 81 mg by mouth at bedtime.    Calcium Carbonate-Vitamin D (CALTRATE 600+D PO) Take 1 tablet by mouth daily.    carvedilol (COREG) 6.25 MG tablet TAKE 1 TABLET BY MOUTH TWICE DAILY (Patient taking differently: Take 6.25 mg by mouth 2 (two) times daily with a meal.)    diclofenac sodium (VOLTAREN) 1 % GEL APPLY TO AFFECTED AREA 3 TIMES DAILY AS NEEDED (Patient taking differently: Apply 2 g topically 3 (three) times daily as needed (pain).)    furosemide (LASIX) 40 MG tablet TAKE 1 TABLET(40 MG) BY MOUTH DAILY    glucose blood test strip Test up to 4 times daily    Insulin Pen Needle 32G X 4 MM MISC Use with insulin Dx code e11.65    nystatin (NYSTATIN) powder Apply 1 application topically 3 (three) times daily.    polyethylene glycol powder (GLYCOLAX/MIRALAX) 17 GM/SCOOP powder     potassium chloride SA (KLOR-CON) 20 MEQ tablet Take 1 tablet (20 mEq total) by mouth once for 1 dose. Please d/c 2 rx for 2 times per day thanks    rosuvastatin (CRESTOR) 10 MG tablet Take 1 tablet (10 mg total) by mouth See admin instructions. MONDAY-FRIDAY. DO NOT TAKE ON SATURDAY OR SUNDAY    Semaglutide (OZEMPIC, 0.25 OR 0.5 MG/DOSE, Arab) Inject 0.25 mg into the skin.    telmisartan (MICARDIS) 80 MG tablet TAKE 1 TABLET(80 MG) BY MOUTH DAILY    TRADJENTA 5 MG TABS tablet Take 5 mg by mouth daily.    TRESIBA FLEXTOUCH 200 UNIT/ML FlexTouch Pen INJECT 36 UNITS UNDER THE SKIN DAILY. MAX TITRATION 80 UNITS (Patient taking differently:  Inject 36 Units into the skin daily.)    UNABLE TO FIND lancets 30 gauge    UNABLE TO FIND Comfort EZ Pen Needles 32 gauge x 5/32"    No facility-administered encounter medications on file as of 10/29/2020.   Reviewed chart for medication changes ahead of medication coordination call.  No OVs, Consults, or hospital visits since last care coordination call/Pharmacist visit. (If appropriate, list visit date, provider name)  No medication changes indicated OR if recent visit, treatment plan here.  BP Readings from Last 3 Encounters:  08/02/20 122/66  06/19/20 132/80  05/30/20 132/74    Lab Results  Component Value Date   HGBA1C 10.9 (H) 08/02/2020     Patient obtains medications through Adherence Packaging  30 Days   Last adherence delivery included:  Carvedilol 6.25 mg- 1 tablet twice daily (before breakfast and evening meal) Telmisartan 80 mg - 1 tablet daily (before breakfast) Rosuvastatin 20 mg- 1 tablet daily (bedtime) Potassium 20 meq- 1 tablet daily (breakfast) Furosemide 40 mg- 1 tablet daily (before breakfast) Caltrate Bone Health (calcium) 600 mg + D3- 1 tablet daily (breakfast).  Aspirin 81 mg- 1 tablet at bedtime  Patient declined (meds) last month: None  Patient is due for next adherence delivery on: 11-06-2020  Called patient and reviewed medications and coordinated delivery.  This delivery to include: Carvedilol 6.25 mg- 1 tablet  twice daily (before breakfast and evening meal) Telmisartan 80 mg - 1 tablet daily (before breakfast) Rosuvastatin 20 mg- 1 tablet daily (bedtime) Potassium 20 meq- 1 tablet daily (breakfast) Furosemide 40 mg- 1 tablet daily (before breakfast) Caltrate Bone Health (calcium) 600 mg + D3- 1 tablet daily (breakfast).  Aspirin 81 mg- 1 tablet at bedtime  No acute/ short fill needed  Patient declined the following medications: None  Patient needs refills for: None  Confirmed delivery date of 11-06-2020 with patient's nurse  advised patient that pharmacy will contact them the morning of delivery.  Care Gaps: Tdap overdue  Shingrix overdue PNA vac overdue Ophthalmology overdue Yearly foot exam overdue Flu vaccine overdue Covid vaccine overdue RAF= 2.104% Medicare wellness overdue  Star Rating Drugs: Rosuvastatin 10 mg- Last filled 10-03-2020 30 Ds Upstream Telmisartan 80 MG- Last filled 08-03--2022 30 Ds Upstream Ozempic 0.5 mg- Samples (In the process of Patient assistance)  Huey Romans [facility] Clinical Pharmacist Assistant [phone]

## 2020-10-30 ENCOUNTER — Ambulatory Visit: Payer: Medicare Other | Admitting: Podiatry

## 2020-10-30 ENCOUNTER — Encounter: Payer: Self-pay | Admitting: Podiatry

## 2020-10-30 ENCOUNTER — Other Ambulatory Visit: Payer: Self-pay

## 2020-10-30 DIAGNOSIS — L608 Other nail disorders: Secondary | ICD-10-CM

## 2020-10-30 DIAGNOSIS — E1140 Type 2 diabetes mellitus with diabetic neuropathy, unspecified: Secondary | ICD-10-CM

## 2020-10-30 DIAGNOSIS — M79676 Pain in unspecified toe(s): Secondary | ICD-10-CM

## 2020-10-30 DIAGNOSIS — B351 Tinea unguium: Secondary | ICD-10-CM | POA: Diagnosis not present

## 2020-10-30 DIAGNOSIS — Z794 Long term (current) use of insulin: Secondary | ICD-10-CM

## 2020-10-30 DIAGNOSIS — N179 Acute kidney failure, unspecified: Secondary | ICD-10-CM

## 2020-10-30 NOTE — Progress Notes (Signed)
This patient returns to my office for at risk foot care.  This patient requires this care by a professional since this patient will be at risk due to having diabetes type 2.  Patient has not been seen in over 15 months. This patient is unable to cut nails herself since the patient cannot reach her nails.These nails are painful walking and wearing shoes.   This patient presents for at risk foot care today.  General Appearance  Alert, conversant and in no acute stress.  Vascular  Dorsalis pedis and posterior tibial  pulses are weakly  palpable  bilaterally.  Capillary return is within normal limits  bilaterally. Temperature is within normal limits  bilaterally.  Neurologic  Senn-Weinstein monofilament wire test diminished  bilaterally. Muscle power within normal limits bilaterally.  Nails Thick disfigured discolored nails with subungual debris  from hallux to fifth toes bilaterally. No evidence of bacterial infection or drainage bilaterally.  Pincer nails   Orthopedic  No limitations of motion  feet .  No crepitus or effusions noted.  HAV  B/L.  Hammer toes  B/L.  Skin  normotropic skin with no porokeratosis noted bilaterally.  No signs of infections or ulcers noted.     Onychomycosis  Pain in right toes  Pain in left toes  Consent was obtained for treatment procedures.   Mechanical debridement of nails 1-5  bilaterally performed with a nail nipper.  Filed with dremel without incident.    Return office visit    3 months                  Told patient to return for periodic foot care and evaluation due to potential at risk complications.   Ajai Terhaar DPM  

## 2020-11-01 DIAGNOSIS — E119 Type 2 diabetes mellitus without complications: Secondary | ICD-10-CM | POA: Diagnosis not present

## 2020-11-08 ENCOUNTER — Ambulatory Visit: Payer: Medicare Other | Admitting: Internal Medicine

## 2020-11-15 ENCOUNTER — Encounter: Payer: Self-pay | Admitting: Internal Medicine

## 2020-11-15 ENCOUNTER — Ambulatory Visit (INDEPENDENT_AMBULATORY_CARE_PROVIDER_SITE_OTHER): Payer: Medicare Other

## 2020-11-15 ENCOUNTER — Other Ambulatory Visit: Payer: Self-pay

## 2020-11-15 ENCOUNTER — Ambulatory Visit: Payer: Medicare Other | Admitting: Internal Medicine

## 2020-11-15 VITALS — BP 122/60 | HR 83 | Temp 98.7°F | Ht 63.0 in | Wt 200.8 lb

## 2020-11-15 DIAGNOSIS — N1831 Chronic kidney disease, stage 3a: Secondary | ICD-10-CM | POA: Diagnosis not present

## 2020-11-15 DIAGNOSIS — Z794 Long term (current) use of insulin: Secondary | ICD-10-CM

## 2020-11-15 DIAGNOSIS — Z23 Encounter for immunization: Secondary | ICD-10-CM

## 2020-11-15 DIAGNOSIS — I129 Hypertensive chronic kidney disease with stage 1 through stage 4 chronic kidney disease, or unspecified chronic kidney disease: Secondary | ICD-10-CM

## 2020-11-15 DIAGNOSIS — Z0000 Encounter for general adult medical examination without abnormal findings: Secondary | ICD-10-CM

## 2020-11-15 DIAGNOSIS — E1122 Type 2 diabetes mellitus with diabetic chronic kidney disease: Secondary | ICD-10-CM | POA: Diagnosis not present

## 2020-11-15 MED ORDER — PREVNAR 20 0.5 ML IM SUSY: 0.5000 mL | PREFILLED_SYRINGE | INTRAMUSCULAR | 0 refills | Status: AC

## 2020-11-15 MED ORDER — BOOSTRIX 5-2.5-18.5 LF-MCG/0.5 IM SUSP: 0.5000 mL | Freq: Once | INTRAMUSCULAR | 0 refills | Status: AC

## 2020-11-15 NOTE — Progress Notes (Signed)
This visit occurred during the SARS-CoV-2 public health emergency.  Safety protocols were in place, including screening questions prior to the visit, additional usage of staff PPE, and extensive cleaning of exam room while observing appropriate contact time as indicated for disinfecting solutions.  Subjective:   Helen Richardson is a 87 y.o. female who presents for Medicare Annual (Subsequent) preventive examination.  Review of Systems     Cardiac Risk Factors include: advanced age (>53men, >64 women);diabetes mellitus;hypertension;obesity (BMI >30kg/m2);sedentary lifestyle     Objective:    Today's Vitals   11/15/20 1417  BP: 122/60  Pulse: 83  Temp: 98.7 F (37.1 C)  TempSrc: Oral  SpO2: 99%  Weight: 200 lb 12.8 oz (91.1 kg)  Height: 5\' 3"  (1.6 m)   Body mass index is 35.57 kg/m.  Advanced Directives 11/15/2020 05/20/2020 03/01/2019 07/01/2018 06/10/2018 05/14/2018 06/28/2015  Does Patient Have a Medical Advance Directive? No No No No No No No  Would patient like information on creating a medical advance directive? No - Patient declined - - No - Patient declined No - Patient declined No - Patient declined -    Current Medications (verified) Outpatient Encounter Medications as of 11/15/2020  Medication Sig   Alcohol Swabs (ALCOHOL PREP) 70 % PADS    aspirin 81 MG chewable tablet Chew 81 mg by mouth at bedtime.   Calcium Carbonate-Vitamin D (CALTRATE 600+D PO) Take 1 tablet by mouth daily.   carvedilol (COREG) 6.25 MG tablet TAKE 1 TABLET BY MOUTH TWICE DAILY (Patient taking differently: Take 6.25 mg by mouth 2 (two) times daily with a meal.)   furosemide (LASIX) 40 MG tablet TAKE 1 TABLET(40 MG) BY MOUTH DAILY   glucose blood test strip Test up to 4 times daily   Insulin Pen Needle 32G X 4 MM MISC Use with insulin Dx code e11.65   nystatin (NYSTATIN) powder Apply 1 application topically 3 (three) times daily.   rosuvastatin (CRESTOR) 10 MG tablet Take 1 tablet (10 mg total) by  mouth See admin instructions. MONDAY-FRIDAY. DO NOT TAKE ON SATURDAY OR SUNDAY   telmisartan (MICARDIS) 80 MG tablet TAKE 1 TABLET(80 MG) BY MOUTH DAILY   TRADJENTA 5 MG TABS tablet Take 5 mg by mouth daily.   TRESIBA FLEXTOUCH 200 UNIT/ML FlexTouch Pen INJECT 36 UNITS UNDER THE SKIN DAILY. MAX TITRATION 80 UNITS (Patient taking differently: Inject 36 Units into the skin daily.)   diclofenac sodium (VOLTAREN) 1 % GEL APPLY TO AFFECTED AREA 3 TIMES DAILY AS NEEDED (Patient not taking: Reported on 11/15/2020)   polyethylene glycol powder (GLYCOLAX/MIRALAX) 17 GM/SCOOP powder  (Patient not taking: Reported on 11/15/2020)   potassium chloride SA (KLOR-CON) 20 MEQ tablet Take 1 tablet (20 mEq total) by mouth once for 1 dose. Please d/c 2 rx for 2 times per day thanks   Semaglutide (OZEMPIC, 0.25 OR 0.5 MG/DOSE, [place]) Inject 0.25 mg into the skin. (Patient not taking: Reported on 11/15/2020)   UNABLE TO FIND lancets 30 gauge   UNABLE TO FIND Comfort EZ Pen Needles 32 gauge x 5/32"   No facility-administered encounter medications on file as of 11/15/2020.    Allergies (verified) Tradjenta [linagliptin]   History: Past Medical History:  Diagnosis Date   CHF (congestive heart failure) (HCC)    Diabetes mellitus without complication (HCC)    High cholesterol    HTN (hypertension)    Obesity    Past Surgical History:  Procedure Laterality Date   CATARACT EXTRACTION, BILATERAL  ingrown toenail Left    left great toenail   PARTIAL HYSTERECTOMY     Family History  Problem Relation Age of Onset   Diabetes Mother    Hypertension Mother    Hypertension Father    Stroke Father    Social History   Socioeconomic History   Marital status: Widowed    Spouse name: Not on file   Number of children: Not on file   Years of education: Not on file   Highest education level: Not on file  Occupational History   Occupation: retired  Tobacco Use   Smoking status: Never   Smokeless tobacco: Never   Vaping Use   Vaping Use: Never used  Substance and Sexual Activity   Alcohol use: No   Drug use: No   Sexual activity: Not Currently  Other Topics Concern   Not on file  Social History Narrative   Not on file   Social Determinants of Health   Financial Resource Strain: Low Risk    Difficulty of Paying Living Expenses: Not hard at all  Food Insecurity: No Food Insecurity   Worried About Programme researcher, broadcasting/film/video in the Last Year: Never true   Ran Out of Food in the Last Year: Never true  Transportation Needs: No Transportation Needs   Lack of Transportation (Medical): No   Lack of Transportation (Non-Medical): No  Physical Activity: Inactive   Days of Exercise per Week: 0 days   Minutes of Exercise per Session: 0 min  Stress: No Stress Concern Present   Feeling of Stress : Not at all  Social Connections: Not on file    Tobacco Counseling Counseling given: Not Answered   Clinical Intake:  Pre-visit preparation completed: Yes  Pain : No/denies pain     Nutritional Status: BMI > 30  Obese Nutritional Risks: None Diabetes: Yes  How often do you need to have someone help you when you read instructions, pamphlets, or other written materials from your doctor or pharmacy?: 1 - Never  Diabetic? Yes Nutrition Risk Assessment:  Has the patient had any N/V/D within the last 2 months?  No  Does the patient have any non-healing wounds?  No  Has the patient had any unintentional weight loss or weight gain?  No   Diabetes:  Is the patient diabetic?  Yes  If diabetic, was a CBG obtained today?  No  Did the patient bring in their glucometer from home?  No  How often do you monitor your CBG's? daily.   Financial Strains and Diabetes Management:  Are you having any financial strains with the device, your supplies or your medication? No .  Does the patient want to be seen by Chronic Care Management for management of their diabetes?  No  Would the patient like to be referred to  a Nutritionist or for Diabetic Management?  No   Diabetic Exams:  Diabetic Eye Exam: Overdue for diabetic eye exam. Pt has been advised about the importance in completing this exam. Patient advised to call and schedule an eye exam. Diabetic Foot Exam: Completed 10/30/2020   Interpreter Needed?: No  Information entered by :: NAllen LPN   Activities of Daily Living In your present state of health, do you have any difficulty performing the following activities: 11/15/2020 03/14/2020  Hearing? Y N  Vision? N N  Difficulty concentrating or making decisions? Y N  Walking or climbing stairs? Y Y  Dressing or bathing? Y N  Doing errands, shopping? Malvin Johns  Comment always has someone with -  Preparing Food and eating ? Y -  Using the Toilet? N -  In the past six months, have you accidently leaked urine? N -  Do you have problems with loss of bowel control? N -  Managing your Medications? Y -  Managing your Finances? N -  Housekeeping or managing your Housekeeping? Y -  Some recent data might be hidden    Patient Care Team: Dorothyann Peng, MD as PCP - General (Internal Medicine) Clarene Duke, Karma Lew, RN as Case Manager Caud[place], Maryjane Hurter, RPH (Inactive) (Pharmacist) Bevelyn Ngo as Social Worker Pearson, Pershing Cox, [facility] (Pharmacist)  Indicate any recent Medical Services you may have received from other than [facility] providers in the past year (date may be approximate).     Assessment:   This is a routine wellness examination for Kamela.  Hearing/Vision screen Vision Screening - Comments:: Regular eye exams,   Dietary issues and exercise activities discussed: Current Exercise Habits: The patient does not participate in regular exercise at present   Goals Addressed             This Visit's Progress    Patient Stated       11/15/2020, no goals       Depression Screen PHQ 2/9 Scores 11/15/2020 03/14/2020 03/01/2019 12/14/2018 07/12/2018 05/18/2018 03/08/2018  PHQ - 2 Score 0 3 0 0 0 0 1   PHQ- 9 Score - 3 0 - - - 1    Fall Risk Fall Risk  11/15/2020 03/14/2020 03/01/2019 12/14/2018 07/12/2018  Falls in the past year? 1 0 0 0 0  Number falls in past yr: 0 0 - - -  Injury with Fall? 0 - - - -  Risk for fall due to : Medication side effect;Impaired mobility - Medication side effect;Impaired balance/gait;Impaired mobility - -  Follow up Falls evaluation completed;Education provided;Falls prevention discussed - Falls evaluation completed;Education provided;Falls prevention discussed - -    FALL RISK PREVENTION PERTAINING TO THE HOME:  Any stairs in or around the home? No  If so, are there any without handrails?  N/a Home free of loose throw rugs in walkways, pet beds, electrical cords, etc? Yes  Adequate lighting in your home to reduce risk of falls? Yes   ASSISTIVE DEVICES UTILIZED TO PREVENT FALLS:  Life alert? Yes  Use of a cane, walker or w/c? Yes  Grab bars in the bathroom? Yes  Shower chair or bench in shower? Yes  Elevated toilet seat or a handicapped toilet? Yes   TIMED UP AND GO:  Was the test performed? No .    Gait slow and steady with assistive device  Cognitive Function:     6CIT Screen 11/15/2020 03/14/2020 03/01/2019  What Year? 0 points 0 points 4 points  What month? 0 points 0 points 0 points  What time? 0 points 0 points 0 points  Count back from 20 0 points 0 points 0 points  Months in reverse 0 points 0 points 0 points  Repeat phrase 0 points 2 points 0 points  Total Score 0 2 4    Immunizations Immunization History  Administered Date(s) Administered   Fluad Quad(high Dose 65+) 03/14/2020   Influenza, High Dose Seasonal PF 11/19/2017, 12/14/2018    TDAP status: Due, Education has been provided regarding the importance of this vaccine. Advised may receive this vaccine at local pharmacy or Health Dept. Aware to provide a copy of the vaccination record if obtained from local pharmacy  or Health Dept. Verbalized acceptance and  understanding.  Flu Vaccine status: Completed at today's visit  Pneumococcal vaccine status: Due, Education has been provided regarding the importance of this vaccine. Advised may receive this vaccine at local pharmacy or Health Dept. Aware to provide a copy of the vaccination record if obtained from local pharmacy or Health Dept. Verbalized acceptance and understanding.  Covid-19 vaccine status: Declined, Education has been provided regarding the importance of this vaccine but patient still declined. Advised may receive this vaccine at local pharmacy or Health Dept.or vaccine clinic. Aware to provide a copy of the vaccination record if obtained from local pharmacy or Health Dept. Verbalized acceptance and understanding.  Qualifies for Shingles Vaccine? Yes   Zostavax completed Yes   Shingrix Completed?: No.    Education has been provided regarding the importance of this vaccine. Patient has been advised to call insurance company to determine out of pocket expense if they have not yet received this vaccine. Advised may also receive vaccine at local pharmacy or Health Dept. Verbalized acceptance and understanding.  Screening Tests Health Maintenance  Topic Date Due   COVID-19 Vaccine (1) Never done   TETANUS/TDAP  Never done   Zoster Vaccines- Shingrix (1 of 2) Never done   PNA vac Low Risk Adult (1 of 2 - PCV13) Never done   OPHTHALMOLOGY EXAM  09/28/2019   INFLUENZA VACCINE  10/01/2020   HEMOGLOBIN A1C  02/01/2021   FOOT EXAM  10/30/2021   DEXA SCAN  Completed   HPV VACCINES  Aged Out    Health Maintenance  Health Maintenance Due  Topic Date Due   COVID-19 Vaccine (1) Never done   TETANUS/TDAP  Never done   Zoster Vaccines- Shingrix (1 of 2) Never done   PNA vac Low Risk Adult (1 of 2 - PCV13) Never done   OPHTHALMOLOGY EXAM  09/28/2019   INFLUENZA VACCINE  10/01/2020    Colorectal cancer screening: No longer required.   Mammogram status: No longer required due to  age.  Bone Density status: Completed 12/22/2013.   Lung Cancer Screening: (Low Dose CT Chest recommended if Age 66-80 years, 30 pack-year currently smoking OR have quit w/in 15years.) does not qualify.   Lung Cancer Screening Referral: no  Additional Screening:  Hepatitis C Screening: does not qualify;   Vision Screening: Recommended annual ophthalmology exams for early detection of glaucoma and other disorders of the eye. Is the patient up to date with their annual eye exam?  No  Who is the provider or what is the name of the office in which the patient attends annual eye exams? Can't remember name If pt is not established with a provider, would they like to be referred to a provider to establish care? No .   Dental Screening: Recommended annual dental exams for proper oral hygiene  Community Resource Referral / Chronic Care Management: CRR required this visit?  No   CCM required this visit?  No      Plan:     I have personally reviewed and noted the following in the patient's chart:   Medical and social history Use of alcohol, tobacco or illicit drugs  Current medications and supplements including opioid prescriptions.  Functional ability and status Nutritional status Physical activity Advanced directives List of other physicians Hospitalizations, surgeries, and ER visits in previous 12 months Vitals Screenings to include cognitive, depression, and falls Referrals and appointments  In addition, I have reviewed and discussed with patient certain preventive protocols, quality  metrics, and best practice recommendations. A written personalized care plan for preventive services as well as general preventive health recommendations were provided to patient.     Barb Merino, LPN   05/10/4074   Nurse Notes:

## 2020-11-15 NOTE — Progress Notes (Signed)
I,YAMILKA J Llittleton,acting as a Education administrator for Maximino Greenland, MD.,have documented all relevant documentation on the behalf of Maximino Greenland, MD,as directed by  Maximino Greenland, MD while in the presence of Maximino Greenland, MD.  This visit occurred during the SARS-CoV-2 public health emergency.  Safety protocols were in place, including screening questions prior to the visit, additional usage of staff PPE, and extensive cleaning of exam room while observing appropriate contact time as indicated for disinfecting solutions.  Subjective:     Patient ID: Helen Richardson , female    DOB: 1933-12-03 , 87 y.o.   MRN: [id number]   Chief Complaint  Patient presents with   Diabetes   Hypertension    HPI  Patient presents today for a DM/HTN f/u. She reports compliance with meds. She is accompanied by her aide, Maudie Mercury today. Unfortunately, she did not bring her medications with her today.   She was seen earlier today by [facility] Advisor for scheduled AWV.   Diabetes She presents for her follow-up diabetic visit. She has type 2 diabetes mellitus. There are no hypoglycemic associated symptoms. Pertinent negatives for diabetes include no blurred vision, no chest pain, no polydipsia, no polyphagia and no polyuria. There are no hypoglycemic complications. She is following a generally healthy diet. Meal planning includes avoidance of concentrated sweets. She never participates in exercise. An ACE inhibitor/angiotensin II receptor blocker is being taken.  Hypertension This is a chronic problem. The current episode started today. The problem has been gradually improving since onset. The problem is uncontrolled. Pertinent negatives include no blurred vision, chest pain, palpitations or shortness of breath. Risk factors for coronary artery disease include diabetes mellitus, dyslipidemia, obesity, post-menopausal state and sedentary lifestyle. The current treatment provides moderate improvement. Compliance problems include  exercise.     Past Medical History:  Diagnosis Date   CHF (congestive heart failure) (HCC)    Diabetes mellitus without complication (HCC)    High cholesterol    HTN (hypertension)    Obesity      Family History  Problem Relation Age of Onset   Diabetes Mother    Hypertension Mother    Hypertension Father    Stroke Father      Current Outpatient Medications:    Zoster Vaccine Adjuvanted [facility]) injection, Inject 0.5 mLs into the muscle once for 1 dose., Disp: 0.5 mL, Rfl: 0   Alcohol Swabs (ALCOHOL PREP) 70 % PADS, , Disp: , Rfl:    aspirin 81 MG chewable tablet, Chew 81 mg by mouth at bedtime., Disp: , Rfl:    Calcium Carbonate-Vitamin D (CALTRATE 600+D PO), Take 1 tablet by mouth daily., Disp: , Rfl:    carvedilol (COREG) 6.25 MG tablet, TAKE 1 TABLET BY MOUTH TWICE DAILY (Patient taking differently: Take 6.25 mg by mouth 2 (two) times daily with a meal.), Disp: 180 tablet, Rfl: 1   diclofenac sodium (VOLTAREN) 1 % GEL, APPLY TO AFFECTED AREA 3 TIMES DAILY AS NEEDED (Patient not taking: Reported on 11/15/2020), Disp: 100 g, Rfl: 1   furosemide (LASIX) 40 MG tablet, TAKE 1 TABLET(40 MG) BY MOUTH DAILY, Disp: 90 tablet, Rfl: 1   glucose blood test strip, Test up to 4 times daily, Disp: 200 each, Rfl: 12   Insulin Pen Needle 32G X 4 MM MISC, Use with insulin Dx code e11.65, Disp: 100 each, Rfl: 3   nystatin (NYSTATIN) powder, Apply 1 application topically 3 (three) times daily., Disp: 15 g, Rfl: 0   polyethylene glycol  powder (GLYCOLAX/MIRALAX) 17 GM/SCOOP powder, , Disp: , Rfl:    potassium chloride SA (KLOR-CON) 20 MEQ tablet, Take 1 tablet (20 mEq total) by mouth once for 1 dose. Please d/c 2 rx for 2 times per day thanks, Disp: 90 tablet, Rfl: 1   rosuvastatin (CRESTOR) 10 MG tablet, Take 1 tablet (10 mg total) by mouth See admin instructions. MONDAY-FRIDAY. DO NOT TAKE ON SATURDAY OR SUNDAY, Disp: 75 tablet, Rfl: 3   Semaglutide (OZEMPIC, 0.25 OR 0.5 MG/DOSE, [place]), Inject 0.25  mg into the skin. (Patient not taking: No sig reported), Disp: , Rfl:    telmisartan (MICARDIS) 80 MG tablet, TAKE 1 TABLET(80 MG) BY MOUTH DAILY, Disp: 90 tablet, Rfl: 1   TRADJENTA 5 MG TABS tablet, Take 5 mg by mouth daily., Disp: , Rfl:    TRESIBA FLEXTOUCH 200 UNIT/ML FlexTouch Pen, INJECT 36 UNITS UNDER THE SKIN DAILY. MAX TITRATION 80 UNITS (Patient taking differently: Inject 36 Units into the skin daily.), Disp: 45 mL, Rfl: 1   UNABLE TO FIND, lancets 30 gauge, Disp: , Rfl:    UNABLE TO FIND, Comfort EZ Pen Needles 32 gauge x 5/32", Disp: , Rfl:    Allergies  Allergen Reactions   Tradjenta [Linagliptin] Swelling     Review of Systems  Constitutional: Negative.   Eyes:  Negative for blurred vision.  Respiratory: Negative.  Negative for shortness of breath.   Cardiovascular: Negative.  Negative for chest pain and palpitations.  Gastrointestinal: Negative.   Endocrine: Negative for polydipsia, polyphagia and polyuria.  Neurological: Negative.   Psychiatric/Behavioral: Negative.      Today's Vitals   11/15/20 1431  BP: 122/60  Pulse: 83  Temp: 98.7 F (37.1 C)  Weight: 200 lb 13.4 oz (91.1 kg)  Height: _0  (1.6 m)   Body mass index is 35.58 kg/m.   Objective:  Physical Exam Vitals and nursing note reviewed.  Constitutional:      Appearance: Normal appearance. She is obese.  HENT:     Head: Normocephalic and atraumatic.     Nose:     Comments: Masked     Mouth/Throat:     Comments: Masked  Eyes:     Extraocular Movements: Extraocular movements intact.  Cardiovascular:     Rate and Rhythm: Normal rate and regular rhythm.     Heart sounds: Normal heart sounds.  Pulmonary:     Effort: Pulmonary effort is normal.     Breath sounds: Normal breath sounds.  Musculoskeletal:     Cervical back: Normal range of motion.  Skin:    General: Skin is warm.  Neurological:     General: No focal deficit present.     Mental Status: She is alert.  Psychiatric:         Mood and Affect: Mood normal.        Behavior: Behavior normal.        Assessment And Plan:     1. Type 2 diabetes mellitus with stage 3a chronic kidney disease, with long-term current use of insulin (HCC) Comments: Chronic, I will check labs as listed below.  I will adjust meds as needed.  Unfortunately, unable to adequately reconcile meds b/c she did not bring meds w/ her. I will also refer her for diabetic eye exam.  - CMP14+EGFR - Hemoglobin A1c - Ambulatory referral to Ophthalmology  2. Hypertensive nephropathy Comments: Chronic, well controlled. She is encouraged to follow low sodium diet. No med changes today.   3. Immunization due Comments:  I will send rx Shingrix to her local pharmacy.    Patient was given opportunity to ask questions. Patient verbalized understanding of the plan and was able to repeat key elements of the plan. All questions were answered to their satisfaction.   I, Maximino Greenland, MD, have reviewed all documentation for this visit. The documentation on 11/15/20 for the exam, diagnosis, procedures, and orders are all accurate and complete.   IF YOU HAVE BEEN REFERRED TO A SPECIALIST, IT MAY TAKE 1-2 WEEKS TO SCHEDULE/PROCESS THE REFERRAL. IF YOU HAVE NOT HEARD FROM US/SPECIALIST IN TWO WEEKS, PLEASE GIVE [place] A CALL AT [phone] X [phone].   THE PATIENT IS ENCOURAGED TO PRACTICE SOCIAL DISTANCING DUE TO THE COVID-19 PANDEMIC.

## 2020-11-15 NOTE — Patient Instructions (Signed)
Diabetes Mellitus and Nutrition, Adult When you have diabetes, or diabetes mellitus, it is very important to have healthy eating habits because your blood sugar (glucose) levels are greatly affected by what you eat and drink. Eating healthy foods in the right amounts, at about the same times every day, can help you:  Control your blood glucose.  Lower your risk of heart disease.  Improve your blood pressure.  Reach or maintain a healthy weight. What can affect my meal plan? Every person with diabetes is different, and each person has different needs for a meal plan. Your health care provider may recommend that you work with a dietitian to make a meal plan that is best for you. Your meal plan may vary depending on factors such as:  The calories you need.  The medicines you take.  Your weight.  Your blood glucose, blood pressure, and cholesterol levels.  Your activity level.  Other health conditions you have, such as heart or kidney disease. How do carbohydrates affect me? Carbohydrates, also called carbs, affect your blood glucose level more than any other type of food. Eating carbs naturally raises the amount of glucose in your blood. Carb counting is a method for keeping track of how many carbs you eat. Counting carbs is important to keep your blood glucose at a healthy level, especially if you use insulin or take certain oral diabetes medicines. It is important to know how many carbs you can safely have in each meal. This is different for every person. Your dietitian can help you calculate how many carbs you should have at each meal and for each snack. How does alcohol affect me? Alcohol can cause a sudden decrease in blood glucose (hypoglycemia), especially if you use insulin or take certain oral diabetes medicines. Hypoglycemia can be a life-threatening condition. Symptoms of hypoglycemia, such as sleepiness, dizziness, and confusion, are similar to symptoms of having too much  alcohol.  Do not drink alcohol if: ? Your health care provider tells you not to drink. ? You are pregnant, may be pregnant, or are planning to become pregnant.  If you drink alcohol: ? Do not drink on an empty stomach. ? Limit how much you use to:  0-1 drink a day for women.  0-2 drinks a day for men. ? Be aware of how much alcohol is in your drink. In the [place] one drink equals one 12 oz bottle of beer (355 mL), one 5 oz glass of wine (148 mL), or one 1 oz glass of hard liquor (44 mL). ? Keep yourself hydrated with water, diet soda, or unsweetened iced tea.  Keep in mind that regular soda, juice, and other mixers may contain a lot of sugar and must be counted as carbs. What are tips for following this plan? Reading food labels  Start by checking the serving size on the "Nutrition Facts" label of packaged foods and drinks. The amount of calories, carbs, fats, and other nutrients listed on the label is based on one serving of the item. Many items contain more than one serving per package.  Check the total grams (g) of carbs in one serving. You can calculate the number of servings of carbs in one serving by dividing the total carbs by 15. For example, if a food has 30 g of total carbs per serving, it would be equal to 2 servings of carbs.  Check the number of grams (g) of saturated fats and trans fats in one serving. Choose foods that have   a low amount or none of these fats.  Check the number of milligrams (mg) of salt (sodium) in one serving. Most people should limit total sodium intake to less than 2,300 mg per day.  Always check the nutrition information of foods labeled as "low-fat" or "nonfat." These foods may be higher in added sugar or refined carbs and should be avoided.  Talk to your dietitian to identify your daily goals for nutrients listed on the label. Shopping  Avoid buying canned, pre-made, or processed foods. These foods tend to be high in fat, sodium, and added  sugar.  Shop around the outside edge of the grocery store. This is where you will most often find fresh fruits and vegetables, bulk grains, fresh meats, and fresh dairy. Cooking  Use low-heat cooking methods, such as baking, instead of high-heat cooking methods like deep frying.  Cook using healthy oils, such as olive, canola, or sunflower oil.  Avoid cooking with butter, cream, or high-fat meats. Meal planning  Eat meals and snacks regularly, preferably at the same times every day. Avoid going long periods of time without eating.  Eat foods that are high in fiber, such as fresh fruits, vegetables, beans, and whole grains. Talk with your dietitian about how many servings of carbs you can eat at each meal.  Eat 4-6 oz (112-168 g) of lean protein each day, such as lean meat, chicken, fish, eggs, or tofu. One ounce (oz) of lean protein is equal to: ? 1 oz (28 g) of meat, chicken, or fish. ? 1 egg. ?  cup (62 g) of tofu.  Eat some foods each day that contain healthy fats, such as avocado, nuts, seeds, and fish.   What foods should I eat? Fruits Berries. Apples. Oranges. Peaches. Apricots. Plums. Grapes. Mango. Papaya. Pomegranate. Kiwi. Cherries. Vegetables Lettuce. Spinach. Leafy greens, including kale, chard, collard greens, and mustard greens. Beets. Cauliflower. Cabbage. Broccoli. Carrots. Green beans. Tomatoes. Peppers. Onions. Cucumbers. Brussels sprouts. Grains Whole grains, such as whole-wheat or whole-grain bread, crackers, tortillas, cereal, and pasta. Unsweetened oatmeal. Quinoa. Brown or wild rice. Meats and other proteins Seafood. Poultry without skin. Lean cuts of poultry and beef. Tofu. Nuts. Seeds. Dairy Low-fat or fat-free dairy products such as milk, yogurt, and cheese. The items listed above may not be a complete list of foods and beverages you can eat. Contact a dietitian for more information. What foods should I avoid? Fruits Fruits canned with  syrup. Vegetables Canned vegetables. Frozen vegetables with butter or cream sauce. Grains Refined white flour and flour products such as bread, pasta, snack foods, and cereals. Avoid all processed foods. Meats and other proteins Fatty cuts of meat. Poultry with skin. Breaded or fried meats. Processed meat. Avoid saturated fats. Dairy Full-fat yogurt, cheese, or milk. Beverages Sweetened drinks, such as soda or iced tea. The items listed above may not be a complete list of foods and beverages you should avoid. Contact a dietitian for more information. Questions to ask a health care provider  Do I need to meet with a diabetes educator?  Do I need to meet with a dietitian?  What number can I call if I have questions?  When are the best times to check my blood glucose? Where to find more information:  American Diabetes Association: diabetes.org  Academy of Nutrition and Dietetics: www.eatright.org  National Institute of Diabetes and Digestive and Kidney Diseases: www.niddk.nih.gov  Association of Diabetes Care and Education Specialists: www.diabeteseducator.org Summary  It is important to have healthy eating   habits because your blood sugar (glucose) levels are greatly affected by what you eat and drink.  A healthy meal plan will help you control your blood glucose and maintain a healthy lifestyle.  Your health care provider may recommend that you work with a dietitian to make a meal plan that is best for you.  Keep in mind that carbohydrates (carbs) and alcohol have immediate effects on your blood glucose levels. It is important to count carbs and to use alcohol carefully. This information is not intended to replace advice given to you by your health care provider. Make sure you discuss any questions you have with your health care provider. Document Revised: 01/25/2019 Document Reviewed: 01/25/2019 Elsevier Patient Education  2021 Elsevier Inc.  

## 2020-11-15 NOTE — Patient Instructions (Signed)
Helen Richardson , Thank you for taking time to come for your Medicare Wellness Visit. I appreciate your ongoing commitment to your health goals. Please review the following plan we discussed and let me know if I can assist you in the future.   Screening recommendations/referrals: Colonoscopy: not required Mammogram: not required Bone Density: completed 12/22/2013 Recommended yearly ophthalmology/optometry visit for glaucoma screening and checkup Recommended yearly dental visit for hygiene and checkup  Vaccinations: Influenza vaccine: today Pneumococcal vaccine: due Tdap vaccine: due Shingles vaccine: discussed   Covid-19:decline  Advanced directives: Advance directive discussed with you today. Even though you declined this today please call our office should you change your mind and we can give you the proper paperwork for you to fill out.  Conditions/risks identified: none  Next appointment: Follow up in one year for your annual wellness visit    Preventive Care 65 Years and Older, Female Preventive care refers to lifestyle choices and visits with your health care provider that can promote health and wellness. What does preventive care include? A yearly physical exam. This is also called an annual well check. Dental exams once or twice a year. Routine eye exams. Ask your health care provider how often you should have your eyes checked. Personal lifestyle choices, including: Daily care of your teeth and gums. Regular physical activity. Eating a healthy diet. Avoiding tobacco and drug use. Limiting alcohol use. Practicing safe sex. Taking low-dose aspirin every day. Taking vitamin and mineral supplements as recommended by your health care provider. What happens during an annual well check? The services and screenings done by your health care provider during your annual well check will depend on your age, overall health, lifestyle risk factors, and family history of  disease. Counseling  Your health care provider may ask you questions about your: Alcohol use. Tobacco use. Drug use. Emotional well-being. Home and relationship well-being. Sexual activity. Eating habits. History of falls. Memory and ability to understand (cognition). Work and work Astronomer. Reproductive health. Screening  You may have the following tests or measurements: Height, weight, and BMI. Blood pressure. Lipid and cholesterol levels. These may be checked every 5 years, or more frequently if you are over 78 years old. Skin check. Lung cancer screening. You may have this screening every year starting at age 92 if you have a 30-pack-year history of smoking and currently smoke or have quit within the past 15 years. Fecal occult blood test (FOBT) of the stool. You may have this test every year starting at age 87. Flexible sigmoidoscopy or colonoscopy. You may have a sigmoidoscopy every 5 years or a colonoscopy every 10 years starting at age 41. Hepatitis C blood test. Hepatitis B blood test. Sexually transmitted disease (STD) testing. Diabetes screening. This is done by checking your blood sugar (glucose) after you have not eaten for a while (fasting). You may have this done every 1-3 years. Bone density scan. This is done to screen for osteoporosis. You may have this done starting at age 28. Mammogram. This may be done every 1-2 years. Talk to your health care provider about how often you should have regular mammograms. Talk with your health care provider about your test results, treatment options, and if necessary, the need for more tests. Vaccines  Your health care provider may recommend certain vaccines, such as: Influenza vaccine. This is recommended every year. Tetanus, diphtheria, and acellular pertussis (Tdap, Td) vaccine. You may need a Td booster every 10 years. Zoster vaccine. You may need this after age  60. Pneumococcal 13-valent conjugate (PCV13) vaccine. One  dose is recommended after age 12. Pneumococcal polysaccharide (PPSV23) vaccine. One dose is recommended after age 80. Talk to your health care provider about which screenings and vaccines you need and how often you need them. This information is not intended to replace advice given to you by your health care provider. Make sure you discuss any questions you have with your health care provider. Document Released: 03/16/2015 Document Revised: 11/07/2015 Document Reviewed: 12/19/2014 Elsevier Interactive Patient Education  2017 [place] Prevention in the Home Falls can cause injuries. They can happen to people of all ages. There are many things you can do to make your home safe and to help prevent falls. What can I do on the outside of my home? Regularly fix the edges of walkways and driveways and fix any cracks. Remove anything that might make you trip as you walk through a door, such as a raised step or threshold. Trim any bushes or trees on the path to your home. Use bright outdoor lighting. Clear any walking paths of anything that might make someone trip, such as rocks or tools. Regularly check to see if handrails are loose or broken. Make sure that both sides of any steps have handrails. Any raised decks and porches should have guardrails on the edges. Have any leaves, snow, or ice cleared regularly. Use sand or salt on walking paths during winter. Clean up any spills in your garage right away. This includes oil or grease spills. What can I do in the bathroom? Use night lights. Install grab bars by the toilet and in the tub and shower. Do not use towel bars as grab bars. Use non-skid mats or decals in the tub or shower. If you need to sit down in the shower, use a plastic, non-slip stool. Keep the floor dry. Clean up any water that spills on the floor as soon as it happens. Remove soap buildup in the tub or shower regularly. Attach bath mats securely with double-sided  non-slip rug tape. Do not have throw rugs and other things on the floor that can make you trip. What can I do in the bedroom? Use night lights. Make sure that you have a light by your bed that is easy to reach. Do not use any sheets or blankets that are too big for your bed. They should not hang down onto the floor. Have a firm chair that has side arms. You can use this for support while you get dressed. Do not have throw rugs and other things on the floor that can make you trip. What can I do in the kitchen? Clean up any spills right away. Avoid walking on wet floors. Keep items that you use a lot in easy-to-reach places. If you need to reach something above you, use a strong step stool that has a grab bar. Keep electrical cords out of the way. Do not use floor polish or wax that makes floors slippery. If you must use wax, use non-skid floor wax. Do not have throw rugs and other things on the floor that can make you trip. What can I do with my stairs? Do not leave any items on the stairs. Make sure that there are handrails on both sides of the stairs and use them. Fix handrails that are broken or loose. Make sure that handrails are as long as the stairways. Check any carpeting to make sure that it is firmly attached to the stairs. Fix  any carpet that is loose or worn. Avoid having throw rugs at the top or bottom of the stairs. If you do have throw rugs, attach them to the floor with carpet tape. Make sure that you have a light switch at the top of the stairs and the bottom of the stairs. If you do not have them, ask someone to add them for you. What else can I do to help prevent falls? Wear shoes that: Do not have high heels. Have rubber bottoms. Are comfortable and fit you well. Are closed at the toe. Do not wear sandals. If you use a stepladder: Make sure that it is fully opened. Do not climb a closed stepladder. Make sure that both sides of the stepladder are locked into place. Ask  someone to hold it for you, if possible. Clearly mark and make sure that you can see: Any grab bars or handrails. First and last steps. Where the edge of each step is. Use tools that help you move around (mobility aids) if they are needed. These include: Canes. Walkers. Scooters. Crutches. Turn on the lights when you go into a dark area. Replace any light bulbs as soon as they burn out. Set up your furniture so you have a clear path. Avoid moving your furniture around. If any of your floors are uneven, fix them. If there are any pets around you, be aware of where they are. Review your medicines with your doctor. Some medicines can make you feel dizzy. This can increase your chance of falling. Ask your doctor what other things that you can do to help prevent falls. This information is not intended to replace advice given to you by your health care provider. Make sure you discuss any questions you have with your health care provider. Document Released: 12/14/2008 Document Revised: 07/26/2015 Document Reviewed: 03/24/2014 Elsevier Interactive Patient Education  2017 [employer].

## 2020-11-16 LAB — CMP14+EGFR
ALT: 11 IU/L (ref 0–32)
AST: 11 IU/L (ref 0–40)
Albumin/Globulin Ratio: 1.4 (ref 1.2–2.2)
Albumin: 3.6 g/dL (ref 3.6–4.6)
Alkaline Phosphatase: 80 IU/L (ref 44–121)
BUN/Creatinine Ratio: 14 (ref 12–28)
BUN: 13 mg/dL (ref 8–27)
Bilirubin Total: 0.3 mg/dL (ref 0.0–1.2)
CO2: 25 mmol/L (ref 20–29)
Calcium: 8.7 mg/dL (ref 8.7–10.3)
Chloride: 99 mmol/L (ref 96–106)
Creatinine, Ser: 0.96 mg/dL (ref 0.57–1.00)
Globulin, Total: 2.5 g/dL (ref 1.5–4.5)
Glucose: 290 mg/dL — ABNORMAL HIGH (ref 65–99)
Potassium: 5.4 mmol/L — ABNORMAL HIGH (ref 3.5–5.2)
Sodium: 138 mmol/L (ref 134–144)
Total Protein: 6.1 g/dL (ref 6.0–8.5)
eGFR: 58 mL/min/{1.73_m2} — ABNORMAL LOW (ref >59)

## 2020-11-16 LAB — HEMOGLOBIN A1C
Est. average glucose Bld gHb Est-mCnc: 237 mg/dL
Hgb A1c MFr Bld: 9.9 % — ABNORMAL HIGH (ref 4.8–5.6)

## 2020-11-19 ENCOUNTER — Ambulatory Visit (INDEPENDENT_AMBULATORY_CARE_PROVIDER_SITE_OTHER): Payer: Medicare Other

## 2020-11-19 ENCOUNTER — Telehealth: Payer: Medicare Other

## 2020-11-19 ENCOUNTER — Telehealth: Payer: Self-pay

## 2020-11-19 DIAGNOSIS — W19XXXA Unspecified fall, initial encounter: Secondary | ICD-10-CM

## 2020-11-19 DIAGNOSIS — I10 Essential (primary) hypertension: Secondary | ICD-10-CM

## 2020-11-19 DIAGNOSIS — E1122 Type 2 diabetes mellitus with diabetic chronic kidney disease: Secondary | ICD-10-CM

## 2020-11-19 DIAGNOSIS — I509 Heart failure, unspecified: Secondary | ICD-10-CM

## 2020-11-19 DIAGNOSIS — N1831 Chronic kidney disease, stage 3a: Secondary | ICD-10-CM

## 2020-11-19 MED ORDER — SHINGRIX 50 MCG/0.5ML IM SUSR: 0.5000 mL | Freq: Once | INTRAMUSCULAR | 0 refills | Status: AC

## 2020-11-19 NOTE — Telephone Encounter (Signed)
  Care Management   Follow Up Note   11/19/2020 Name: Helen Richardson MRN: [id number] DOB: 07-14-1933   Referred by: Dorothyann Peng, MD Reason for referral : Chronic Care Management (RN CM Follow up call )   An unsuccessful telephone outreach was attempted today. The patient was referred to the case management team for assistance with care management and care coordination.   Follow Up Plan: A HIPPA compliant phone message was left for the patient providing contact information and requesting a return call.   Delsa Sale, RN, BSN, CCM Care Management Coordinator [facility] Care Management/[facility]  Direct Phone: [phone]

## 2020-11-21 ENCOUNTER — Telehealth: Payer: Medicare Other

## 2020-11-22 ENCOUNTER — Telehealth: Payer: Self-pay

## 2020-11-22 ENCOUNTER — Telehealth: Payer: Medicare Other

## 2020-11-22 NOTE — Telephone Encounter (Signed)
  Care Management   Follow Up Note   11/22/2020 Name: Helen Richardson MRN: [id number] DOB: 25-Oct-1933   Referred by: Dorothyann Peng, MD Reason for referral : Chronic Care Management (RN CM Follow up call )   An unsuccessful telephone outreach was attempted today. The patient was referred to the case management team for assistance with care management and care coordination.   Follow Up Plan: Telephone follow up appointment with care management team member scheduled for: 12/05/20  Delsa Sale, RN, BSN, CCM Care Management Coordinator [facility] Care Management/[facility]  Direct Phone: [phone]

## 2020-11-23 ENCOUNTER — Other Ambulatory Visit: Payer: Self-pay

## 2020-11-23 MED ORDER — OZEMPIC (0.25 OR 0.5 MG/DOSE) 2 MG/1.5ML ~~LOC~~ SOPN: 0.2500 mg | PEN_INJECTOR | SUBCUTANEOUS | 1 refills | Status: DC

## 2020-11-26 ENCOUNTER — Telehealth: Payer: Self-pay

## 2020-11-26 NOTE — Chronic Care Management (AMB) (Signed)
Chronic Care Management Pharmacy Assistant   Name: Helen Richardson  MRN: [id number] DOB: Jan 22, 1934   Reason for Encounter: Medication Review/ Medication coordination    Recent office visits:  11-15-2020 Kellie Simmering, LPN. Medicare wellness visit. Prevnar and tdap given.  11-15-2020 Glendale Chard, MD. Shingrix given. A1C= 9.9. Glucose= 290, eGFR= 58, Potassium= 5.4. Referral placed to ophthalmology.  11-19-2020 Little, Claudette Stapler, RN (CCM)  Recent consult visits:  10-30-2020 Gardiner Barefoot, DPM (podiatry). Dibridement of nails  Hospital visits:  None in previous 6 months  Medications: Outpatient Encounter Medications as of 11/26/2020  Medication Sig Note   Alcohol Swabs (ALCOHOL PREP) 70 % PADS  03/10/2013: Received from: External Pharmacy   aspirin 81 MG chewable tablet Chew 81 mg by mouth at bedtime.    Calcium Carbonate-Vitamin D (CALTRATE 600+D PO) Take 1 tablet by mouth daily.    carvedilol (COREG) 6.25 MG tablet TAKE 1 TABLET BY MOUTH TWICE DAILY (Patient taking differently: Take 6.25 mg by mouth 2 (two) times daily with a meal.)    diclofenac sodium (VOLTAREN) 1 % GEL APPLY TO AFFECTED AREA 3 TIMES DAILY AS NEEDED (Patient not taking: Reported on 11/15/2020)    furosemide (LASIX) 40 MG tablet TAKE 1 TABLET(40 MG) BY MOUTH DAILY    glucose blood test strip Test up to 4 times daily    Insulin Pen Needle 32G X 4 MM MISC Use with insulin Dx code e11.65    nystatin (NYSTATIN) powder Apply 1 application topically 3 (three) times daily.    polyethylene glycol powder (GLYCOLAX/MIRALAX) 17 GM/SCOOP powder  (Patient not taking: Reported on 11/15/2020)    potassium chloride SA (KLOR-CON) 20 MEQ tablet Take 1 tablet (20 mEq total) by mouth once for 1 dose. Please d/c 2 rx for 2 times per day thanks    rosuvastatin (CRESTOR) 10 MG tablet Take 1 tablet (10 mg total) by mouth See admin instructions. MONDAY-FRIDAY. DO NOT TAKE ON SATURDAY OR SUNDAY    Semaglutide,0.25 or 0.5MG/DOS,  (OZEMPIC, 0.25 OR 0.5 MG/DOSE,) 2 MG/1.5ML SOPN Inject 0.25 mg into the skin once a week.    telmisartan (MICARDIS) 80 MG tablet TAKE 1 TABLET(80 MG) BY MOUTH DAILY    TRADJENTA 5 MG TABS tablet Take 5 mg by mouth daily.    TRESIBA FLEXTOUCH 200 UNIT/ML FlexTouch Pen INJECT 36 UNITS UNDER THE SKIN DAILY. MAX TITRATION 80 UNITS (Patient taking differently: Inject 36 Units into the skin daily.)    UNABLE TO FIND lancets 30 gauge    UNABLE TO FIND Comfort EZ Pen Needles 32 gauge x 5/32"    No facility-administered encounter medications on file as of 11/26/2020.  Reviewed chart for medication changes ahead of medication coordination call.  No OVs, Consults, or hospital visits since last care coordination call/Pharmacist visit. (If appropriate, list visit date, provider name)  No medication changes indicated OR if recent visit, treatment plan here.  BP Readings from Last 3 Encounters:  11/15/20 122/60  11/15/20 122/60  08/02/20 122/66    Lab Results  Component Value Date   HGBA1C 9.9 (H) 11/15/2020     Patient obtains medications through Adherence Packaging  30 Days   Last adherence delivery included:  Carvedilol 6.25 mg- 1 tablet twice daily (before breakfast and evening meal) Telmisartan 80 mg - 1 tablet daily (before breakfast) Rosuvastatin 20 mg- 1 tablet daily (bedtime) Potassium 20 meq- 1 tablet daily (breakfast) Furosemide 40 mg- 1 tablet daily (before breakfast) Caltrate Bone Health (calcium) 600 mg +  D3- 1 tablet daily (breakfast).  Aspirin 81 mg- 1 tablet at bedtime  Patient declined (meds) last month: None  Patient is due for next adherence delivery on: 12-06-2020  Called patient and reviewed medications and coordinated delivery.  This delivery to include: Carvedilol 6.25 mg- 1 tablet twice daily (before breakfast and evening meal) Telmisartan 80 mg - 1 tablet daily (before breakfast) Rosuvastatin 20 mg- 1 tablet daily (bedtime) Monday-Friday Potassium 20 meq- 1  tablet daily (breakfast) Furosemide 40 mg- 1 tablet daily (before breakfast) Caltrate Bone Health (calcium) 600 mg + D3- 1 tablet daily (breakfast).  Aspirin 81 mg- 1 tablet at bedtime  No acute/ short fill needed   Patient needs refills for: None  Confirmed delivery date of 12-06-2020 advised patient that pharmacy will contact them the morning of delivery.   NOTES: Called [facility] to follow up on Ozempic. Patient was approved and medication was delivered to office on 11-26-2020. Spoke with patient  and nurse Karena Addison ([phone])on 11-28-2020 and told patient's nurse that Ozempic is ready for pick up.   Care Gaps: Ophthalmology overdue Covid vaccine overdue Last AWV 11-15-2020  Star Rating Drugs: Rosuvastatin 10 mg- Last filled 11-01-2020 30 Ds Upstream Telmisartan 80 MG- Last filled 11-01-2020 30 Ds [facility] Ozempic 0.5 mg- Patient assistance   [place] Pharmacist Assistant [phone]

## 2020-11-26 NOTE — Patient Instructions (Signed)
Visit Information  PATIENT GOALS:  Goals Addressed      Monitor and Manage My Blood Sugar-Diabetes Type 2   On track    Timeframe:  Long-Range Goal Priority:  High Start Date: 06/18/20                            Expected End Date: 06/18/21                     Follow Up Date: 12/05/20   - check blood sugar at prescribed times - check blood sugar if I feel it is too high or too low - enter blood sugar readings and medication or insulin into daily log - take the blood sugar log to all doctor visits - take the blood sugar meter to all doctor visits    Why is this important?   Checking your blood sugar at home helps to keep it from getting very high or very low.  Writing the results in a diary or log helps the doctor know how to care for you.  Your blood sugar log should have the time, date and the results.  Also, write down the amount of insulin or other medicine that you take.  Other information, like what you ate, exercise done and how you were feeling, will also be helpful.     Notes:      Obtain Eye Exam-Diabetes Type 2       Timeframe:  Long-Range Goal Priority:  Medium Start Date: 06/18/20                            Expected End Date: 06/18/21                       Follow Up Date: 12/05/20    - keep appointment with eye doctor - schedule appointment with eye doctor    Why is this important?   Eye check-ups are important when you have diabetes.  Vision loss can be prevented.    Notes:      Perform Foot Care-Diabetes Type 2       Timeframe:  Long-Range Goal Priority:  Medium Start Date: 06/18/20                           Expected End Date: 06/18/21                     Follow Up Date: 12/05/20   - check feet daily for cuts, sores or redness - keep feet up while sitting - trim toenails straight across - wash and dry feet carefully every day - wear comfortable, cotton socks - wear comfortable, well-fitting shoes    Why is this important?   Good foot care is very  important when you have diabetes.  There are many things you can do to keep your feet healthy and catch a problem early.    Notes:         The patient verbalized understanding of instructions, educational materials, and care plan provided today and declined offer to receive copy of patient instructions, educational materials, and care plan.   Telephone follow up appointment with care management team member scheduled for: 12/05/20  Delsa Sale, RN, BSN, CCM Care Management Coordinator [facility]  Direct Phone: [phone]

## 2020-11-26 NOTE — Chronic Care Management (AMB) (Signed)
Chronic Care Management   CCM RN Visit Note  11/19/2020 Name: Helen Richardson MRN: [id number] DOB: 1933/07/28  Subjective: Helen Richardson is a 87 y.o. year old female who is a primary care patient of Glendale Chard, MD. The care management team was consulted for assistance with disease management and care coordination needs.    Engaged with patient by telephone for follow up visit in response to provider referral for case management and/or care coordination services.   Consent to Services:  The patient was given information about Chronic Care Management services, agreed to services, and gave verbal consent prior to initiation of services.  Please see initial visit note for detailed documentation.   Patient agreed to services and verbal consent obtained.   Assessment: Review of patient past medical history, allergies, medications, health status, including review of consultants reports, laboratory and other test data, was performed as part of comprehensive evaluation and provision of chronic care management services.   SDOH (Social Determinants of Health) assessments and interventions performed:  Yes, no acute challenges   CCM Care Plan  Allergies  Allergen Reactions   Tradjenta [Linagliptin] Swelling    Outpatient Encounter Medications as of 11/19/2020  Medication Sig Note   Alcohol Swabs (ALCOHOL PREP) 70 % PADS  03/10/2013: Received from: External Pharmacy   aspirin 81 MG chewable tablet Chew 81 mg by mouth at bedtime.    Calcium Carbonate-Vitamin D (CALTRATE 600+D PO) Take 1 tablet by mouth daily.    carvedilol (COREG) 6.25 MG tablet TAKE 1 TABLET BY MOUTH TWICE DAILY (Patient taking differently: Take 6.25 mg by mouth 2 (two) times daily with a meal.)    diclofenac sodium (VOLTAREN) 1 % GEL APPLY TO AFFECTED AREA 3 TIMES DAILY AS NEEDED (Patient not taking: Reported on 11/15/2020)    furosemide (LASIX) 40 MG tablet TAKE 1 TABLET(40 MG) BY MOUTH DAILY    glucose blood test strip Test  up to 4 times daily    Insulin Pen Needle 32G X 4 MM MISC Use with insulin Dx code e11.65    nystatin (NYSTATIN) powder Apply 1 application topically 3 (three) times daily.    polyethylene glycol powder (GLYCOLAX/MIRALAX) 17 GM/SCOOP powder  (Patient not taking: Reported on 11/15/2020)    potassium chloride SA (KLOR-CON) 20 MEQ tablet Take 1 tablet (20 mEq total) by mouth once for 1 dose. Please d/c 2 rx for 2 times per day thanks    rosuvastatin (CRESTOR) 10 MG tablet Take 1 tablet (10 mg total) by mouth See admin instructions. MONDAY-FRIDAY. DO NOT TAKE ON SATURDAY OR SUNDAY    telmisartan (MICARDIS) 80 MG tablet TAKE 1 TABLET(80 MG) BY MOUTH DAILY    TRADJENTA 5 MG TABS tablet Take 5 mg by mouth daily.    TRESIBA FLEXTOUCH 200 UNIT/ML FlexTouch Pen INJECT 36 UNITS UNDER THE SKIN DAILY. MAX TITRATION 80 UNITS (Patient taking differently: Inject 36 Units into the skin daily.)    UNABLE TO FIND lancets 30 gauge    UNABLE TO FIND Comfort EZ Pen Needles 32 gauge x 5/32"    [DISCONTINUED] Semaglutide (OZEMPIC, 0.25 OR 0.5 MG/DOSE, [place]) Inject 0.25 mg into the skin. (Patient not taking: No sig reported)    No facility-administered encounter medications on file as of 11/19/2020.    Patient Active Problem List   Diagnosis Date Noted   Intermediate stage nonexudative age-related macular degeneration of both eyes 07/04/2019   Posterior vitreous detachment of right eye 07/04/2019   Degenerative retinal drusen of both eyes  07/04/2019   Chorioretinal scar 07/04/2019   Pincer nail deformity 04/12/2019   AKI (acute kidney injury) ([place]) 06/11/2018   Sepsis due to gram-negative UTI ([place]) 06/11/2018   Acute lower UTI (urinary tract infection) 06/10/2018   HTN (hypertension) 06/10/2018   Joint swelling 06/10/2018   Hyperkalemia 06/10/2018   Hyponatremia 06/10/2018   Normocytic anemia 06/10/2018   Type 2 diabetes mellitus with stage 3 chronic kidney disease, with long-term current use of insulin ([place])  03/08/2018   Hypertensive nephropathy 03/08/2018   Pure hypercholesterolemia 03/08/2018   Class 1 obesity due to excess calories with serious comorbidity and body mass index (BMI) of 34.0 to 34.9 in adult 03/08/2018    Conditions to be addressed/monitored: DM, HTN, s/p fall, Medication Management, CHF  Care Plan : Diabetes Type 2 (Adult)  Updates made by Lynne Logan, RN since 11/19/2020 12:00 AM     Problem: Glycemic Management (Diabetes, Type 2)   Priority: High     Long-Range Goal: Glycemic Management Optimized   Start Date: 06/18/2020  Expected End Date: 06/18/2021  Recent Progress: On track  Priority: High  Note:   Objective:  Lab Results  Component Value Date   HGBA1C 9.9 (H) 11/15/2020   Lab Results  Component Value Date   CREATININE 0.96 11/15/2020   CREATININE 1.12 (H) 08/02/2020   CREATININE 1.04 (H) 05/20/2020   Lab Results  Component Value Date   EGFR 58 (L) 11/15/2020    Current Barriers:  Knowledge Deficits related to basic Diabetes pathophysiology and self care/management Knowledge Deficits related to medications used for management of diabetes Case Manager Clinical Goal(s):  patient will demonstrate improved adherence to prescribed treatment plan for diabetes self care/management as evidenced by: daily monitoring and recording of CBG  adherence to ADA/ carb modified diet exercise 5 days/week adherence to prescribed medication regimen contacting provider for new or worsened symptoms or questions Interventions:  11/19/20 completed successful outbound call with patient  Collaboration with Glendale Chard, MD regarding development and update of comprehensive plan of care as evidenced by provider attestation and co-signature Inter-disciplinary care team collaboration (see longitudinal plan of care) Provided education to patient about basic DM disease process Review of patient status, including review of consultants reports, relevant laboratory and other test  results, and medications completed. Reviewed medications with patient and discussed importance of medication adherence Determined patient is independently self administering her medications with the supervision of a caregiver "Helen Richardson", she has a different caregiver that helps her during weekends Determined patient is unable to review her medications today, she would prefer caregiver Helen Richardson complete a comprehensive medication review Determined patient is not taking Ozempic, although she does recall taking it for a short time in the past Completed joint call with preferred pharmacy, confirmed Ozempic was not prescribed for patient at this pharmacy Collaborated with Dr. Baird Cancer via secure chat to advise, she will consider patient Ozempic samples in order to resume this treatment, her office will contact the patient to advise patient of next steps  Assessed for adherence to self monitoring cbg's and patient states she checks her sugars 2-3 times a day before meals, reports some readings are high but less than 250 Educated patient on dietary and exercise recommendations; daily glycemic control FBS 80-130, <180 after meals;15'15' rule Advised patient, providing education and rationale, to check cbg daily before meals and at bedtime and record, calling the PCP and or CCM RN for findings outside established parameters.   Discussed plans with patient for ongoing care management  follow up and provided patient with direct contact information for care management team Self-Care Activities Self administers oral medications as prescribed Self administers insulin as prescribed Attends all scheduled provider appointments Checks blood sugars as prescribed and utilize hyper and hypoglycemia protocol as needed Adheres to prescribed ADA/carb modified Patient Goals: - check blood sugar at prescribed times - check blood sugar if I feel it is too high or too low - enter blood sugar readings and medication or insulin into  daily log - take the blood sugar log to all doctor visits - take the blood sugar meter to all doctor visits - keep appointment with eye doctor - schedule appointment with eye doctor - check feet daily for cuts, sores or redness - keep feet up while sitting - trim toenails straight across - wash and dry feet carefully every day - wear comfortable, cotton socks - wear comfortable, well-fitting shoes  Follow Up Plan: Telephone follow up appointment with care management team member scheduled for: 12/05/20    Plan:Telephone follow up appointment with care management team member scheduled for:  12/05/20  Barb Merino, RN, BSN, CCM Care Management Coordinator [place] [facility]  Direct Phone: [phone]

## 2020-11-27 ENCOUNTER — Telehealth: Payer: Self-pay

## 2020-11-27 ENCOUNTER — Telehealth: Payer: Medicare Other

## 2020-11-27 NOTE — Telephone Encounter (Signed)
  Care Management   Follow Up Note   11/27/2020 Name: Helen Richardson MRN: [id number] DOB: [birth year]   Referred by: Dorothyann Peng, MD Reason for referral : Chronic Care Management (Unsuccessful call)   An unsuccessful telephone outreach was attempted today. The patient was referred to the case management team for assistance with care management and care coordination.   Follow Up Plan: The care management team will reach out to the patient again over the next 14 days.   Bevelyn Ngo, BSW, CDP Social Worker, Certified Dementia Practitioner [facility] Care Management [phone]

## 2020-11-27 NOTE — Telephone Encounter (Signed)
I left a message that the pt's ozempic is ready for pickup from the [employer] patient assistance program.

## 2020-11-29 NOTE — Telephone Encounter (Signed)
Error

## 2020-11-30 DIAGNOSIS — E1122 Type 2 diabetes mellitus with diabetic chronic kidney disease: Secondary | ICD-10-CM

## 2020-11-30 DIAGNOSIS — N1831 Chronic kidney disease, stage 3a: Secondary | ICD-10-CM

## 2020-11-30 DIAGNOSIS — Z794 Long term (current) use of insulin: Secondary | ICD-10-CM | POA: Diagnosis not present

## 2020-11-30 DIAGNOSIS — I509 Heart failure, unspecified: Secondary | ICD-10-CM

## 2020-11-30 DIAGNOSIS — I10 Essential (primary) hypertension: Secondary | ICD-10-CM

## 2020-12-03 DIAGNOSIS — E119 Type 2 diabetes mellitus without complications: Secondary | ICD-10-CM | POA: Diagnosis not present

## 2020-12-04 ENCOUNTER — Telehealth: Payer: Self-pay

## 2020-12-04 NOTE — Telephone Encounter (Signed)
Morrie Sheldon the pt's granddaughter was notified that the pt's ozempic has been delivered from the [employer] pt assistance program.

## 2020-12-05 ENCOUNTER — Telehealth: Payer: Medicare Other

## 2020-12-05 ENCOUNTER — Telehealth: Payer: Self-pay

## 2020-12-05 NOTE — Telephone Encounter (Signed)
  Care Management   Follow Up Note   12/05/2020 Name: Helen Richardson MRN: [id number] DOB: 07-20-1933   Referred by: Dorothyann Peng, MD Reason for referral : Chronic Care Management (RN CM Follow up call )   An unsuccessful telephone outreach was attempted today. The patient was referred to the case management team for assistance with care management and care coordination.   Follow Up Plan: A HIPPA compliant phone message was left for the patient providing contact information and requesting a return call.   Delsa Sale, RN, BSN, CCM Care Management Coordinator [facility] Care Management/[facility]  Direct Phone: [phone]

## 2020-12-10 ENCOUNTER — Telehealth: Payer: Self-pay

## 2020-12-10 ENCOUNTER — Telehealth: Payer: Medicare Other

## 2020-12-10 NOTE — Telephone Encounter (Signed)
  Care Management   Follow Up Note   12/10/2020 Name: Helen Richardson MRN: [id number] DOB: [birth year]   Referred by: Dorothyann Peng, MD Reason for referral : Chronic Care Management (Unsuccessful call)   A second unsuccessful telephone outreach was attempted today. The patient was referred to the case management team for assistance with care management and care coordination. SW left a HIPAA compliant voice message requesting a return call.  Follow Up Plan: The care management team will reach out to the patient again over the next 21 days.   Bevelyn Ngo, BSW, CDP Social Worker, Certified Dementia Practitioner [facility] Care Management [phone]

## 2020-12-26 ENCOUNTER — Telehealth: Payer: Self-pay

## 2020-12-26 NOTE — Chronic Care Management (AMB) (Signed)
Chronic Care Management Pharmacy Assistant   Name: Helen Richardson  MRN: [id number] DOB: 12-04-1933  Reason for Encounter: Medication Review/ Medication Coordination Call   Recent office visits:  None  Recent consult visits:  None  Hospital visits:  None in previous 6 months   Reviewed chart for medication changes ahead of medication coordination call.  No OVs, Consults, or hospital visits since last care coordination call/Pharmacist visit.  No medication changes indicated.  BP Readings from Last 3 Encounters:  11/15/20 122/60  11/15/20 122/60  08/02/20 122/66    Lab Results  Component Value Date   HGBA1C 9.9 (H) 11/15/2020     Patient obtains medications through Adherence Packaging  30 Days   Last adherence delivery included:  Carvedilol 6.25 mg- 1 tablet twice daily (before breakfast and evening meal) Telmisartan 80 mg - 1 tablet daily (before breakfast) Rosuvastatin 20 mg- 1 tablet daily (bedtime) Monday-Friday Potassium 20 meq- 1 tablet daily (breakfast) Furosemide 40 mg- 1 tablet daily (before breakfast) Caltrate Bone Health (calcium) 600 mg + D3- 1 tablet daily (breakfast).  Aspirin 81 mg- 1 tablet at bedtime  Patient declined the following medication last month: Ozempic 2/1.5 ml pen- Inject 0.25 mg into skin once a week Due to receiving through Patient Assistance.  Patient is due for next adherence delivery on: 01/08/2021. Called patient and reviewed medications and coordinated delivery.No answer with 3 attempts.   12/26/2020- Called patient, nurse not available at the time to review medications, patient unaware of medications needed she was still in the bed, patient stated she was feeling ok, no concerns just waiting on nurse to come and assist with her getting up, breakfast and administering medications. Will retry in a few hours to contact Nurse-[name] again to review medications for delivery.  12/26/2020- Called patient, no answer, left message to return  call  12/31/2020- Called patient no answer, left message to return call with any mediation changes, notified delivery date of 01/08/2021. [facility] will contact patient before delivering.  Medications: Outpatient Encounter Medications as of 12/26/2020  Medication Sig Note   Alcohol Swabs (ALCOHOL PREP) 70 % PADS  03/10/2013: Received from: External Pharmacy   aspirin 81 MG chewable tablet Chew 81 mg by mouth at bedtime.    Calcium Carbonate-Vitamin D (CALTRATE 600+D PO) Take 1 tablet by mouth daily.    carvedilol (COREG) 6.25 MG tablet TAKE 1 TABLET BY MOUTH TWICE DAILY (Patient taking differently: Take 6.25 mg by mouth 2 (two) times daily with a meal.)    diclofenac sodium (VOLTAREN) 1 % GEL APPLY TO AFFECTED AREA 3 TIMES DAILY AS NEEDED (Patient not taking: Reported on 11/15/2020)    furosemide (LASIX) 40 MG tablet TAKE 1 TABLET(40 MG) BY MOUTH DAILY    glucose blood test strip Test up to 4 times daily    Insulin Pen Needle 32G X 4 MM MISC Use with insulin Dx code e11.65    nystatin (NYSTATIN) powder Apply 1 application topically 3 (three) times daily.    polyethylene glycol powder (GLYCOLAX/MIRALAX) 17 GM/SCOOP powder  (Patient not taking: Reported on 11/15/2020)    potassium chloride SA (KLOR-CON) 20 MEQ tablet Take 1 tablet (20 mEq total) by mouth once for 1 dose. Please d/c 2 rx for 2 times per day thanks    rosuvastatin (CRESTOR) 10 MG tablet Take 1 tablet (10 mg total) by mouth See admin instructions. MONDAY-FRIDAY. DO NOT TAKE ON SATURDAY OR SUNDAY    Semaglutide,0.25 or 0.5MG /DOS, (OZEMPIC, 0.25 OR 0.5  MG/DOSE,) 2 MG/1.5ML SOPN Inject 0.25 mg into the skin once a week.    telmisartan (MICARDIS) 80 MG tablet TAKE 1 TABLET(80 MG) BY MOUTH DAILY    TRADJENTA 5 MG TABS tablet Take 5 mg by mouth daily.    TRESIBA FLEXTOUCH 200 UNIT/ML FlexTouch Pen INJECT 36 UNITS UNDER THE SKIN DAILY. MAX TITRATION 80 UNITS (Patient taking differently: Inject 36 Units into the skin daily.)    UNABLE  TO FIND lancets 30 gauge    UNABLE TO FIND Comfort EZ Pen Needles 32 gauge x 5/32"    No facility-administered encounter medications on file as of 12/26/2020.   [name], CMA Clinical Pharmacist Assistant [phone]

## 2020-12-27 ENCOUNTER — Telehealth: Payer: Medicare Other

## 2020-12-28 ENCOUNTER — Telehealth: Payer: Medicare Other

## 2020-12-28 ENCOUNTER — Ambulatory Visit: Payer: Self-pay

## 2020-12-28 DIAGNOSIS — Z794 Long term (current) use of insulin: Secondary | ICD-10-CM

## 2020-12-28 DIAGNOSIS — I10 Essential (primary) hypertension: Secondary | ICD-10-CM

## 2020-12-28 DIAGNOSIS — I509 Heart failure, unspecified: Secondary | ICD-10-CM

## 2020-12-28 DIAGNOSIS — N1831 Chronic kidney disease, stage 3a: Secondary | ICD-10-CM

## 2020-12-28 NOTE — Patient Instructions (Signed)
Social Worker Visit Information  Goals we discussed today:   Goals Addressed             This Visit's Progress    COMPLETED: Work with SW to manage care coordination needs       Timeframe:  Long-Range Goal Priority:  Low Start Date:  12.2.21                           Expected End Date:   10.25.22                    10.28.22- goal closed due to inability to maintain patient contact  Patient Goals/Self-Care Activities Over the next 60 days, patient will:   - Attend all scheduled provider appointments -Call pharmacy for medication refills -Call provider office for new concerns or questions -Contact SW as needed prior to next scheduled call         Materials Provided: No. Patient not reached.   Follow Up Plan: No SW follow up planned at this time. Please contact me as needed.  Bevelyn Ngo, BSW, CDP Social Worker, Certified Dementia Practitioner [facility] Care Management [phone]

## 2020-12-28 NOTE — Chronic Care Management (AMB) (Signed)
  Care Management   Follow Up Note   12/28/2020 Name: LILLYAHNA HEMBERGER MRN: [id number] DOB: 05/14/1933   Referred by: Dorothyann Peng, MD Reason for referral : Chronic Care Management (Discipline closure)   Third unsuccessful telephone outreach was attempted today. The patient was referred to the case management team for assistance with care management and care coordination. The patient's primary care provider has been notified of our unsuccessful attempts to make or maintain contact with the patient. The care management team is pleased to engage with this patient at any time in the future should he/she be interested in assistance from the care management team.    Patient Care Plan: Social Work Care Plan  Completed 12/28/2020   Problem Identified: Care Coordination Resolved 12/28/2020     Long-Range Goal: Collaborate with RN Care Manager to perform appropriate assessments to assist with care coordination needs Completed 12/28/2020  Start Date: 02/02/2020  Expected End Date: 12/25/2020  Recent Progress: On track  Priority: Low  Note:   Current Barriers:  Chronic disease management support and education needs related to CHF, HTN, and DMII  Transportation and Limited access to caregiver  Social Worker Clinical Goal(s):   patient will work with SW to identify and address any acute and/or chronic care coordination needs related to the self health management of CHF, HTN, and DMII   10.28.22- goal closed due to inability to maintain patient contact  CCM SW Interventions:  Current Barriers:  1:1 collaboration with Dorothyann Peng, MD regarding development and update of comprehensive plan of care as evidenced by provider attestation and co-signature Inter-disciplinary care team collaboration (see longitudinal plan of care) Successful outbound call placed to the patient to assess for care coordination needs- spoke with the patient and her morning caregiver [name] Discussed SW was contacted by  pharmacy team indicating patient had contacted Huey Romans, CMA on the afternoon of 07/09/20 requesting assistance with placement into Assisted Living Assessed for patient interest in placement at this time Patient denies requesting assistance with placement and stated someone else must have called because she did not Confirmed patient still has assistance in the home - patient indicates she has two caregivers arranged by family members including [place]. Patient also has a caregiver provided by the [facility] in home aid program Discussed the patients desire to remain in her home and is not interested in placement Scheduled follow up call over the next 90 days Encouraged the patient to contact SW as needed prior to next scheduled call  Patient Goals/Self-Care Activities  patient will:   - Attend all scheduled provider appointments -Call pharmacy for medication refills -Call provider office for new concerns or questions -Contact SW as needed prior to next scheduled call  Follow Up Plan: SW will follow up with the patient over the next 90 days       Follow Up Plan:  No SW follow up planned at this time. Patient will remain engaged with RN Care Manager.  Bevelyn Ngo, BSW, CDP Social Worker, Certified Dementia Practitioner [facility] Care Management [phone]

## 2021-01-03 DIAGNOSIS — E119 Type 2 diabetes mellitus without complications: Secondary | ICD-10-CM | POA: Diagnosis not present

## 2021-01-09 ENCOUNTER — Telehealth: Payer: Self-pay

## 2021-01-09 NOTE — Chronic Care Management (AMB) (Signed)
    Chronic Care Management Pharmacy Assistant   Name: Helen Richardson  MRN: [id number] DOB: 1933/08/09   Reason for Encounter: 2023 PAP     Medications: Outpatient Encounter Medications as of 01/09/2021  Medication Sig Note   Alcohol Swabs (ALCOHOL PREP) 70 % PADS  03/10/2013: Received from: External Pharmacy   aspirin 81 MG chewable tablet Chew 81 mg by mouth at bedtime.    Calcium Carbonate-Vitamin D (CALTRATE 600+D PO) Take 1 tablet by mouth daily.    carvedilol (COREG) 6.25 MG tablet TAKE 1 TABLET BY MOUTH TWICE DAILY (Patient taking differently: Take 6.25 mg by mouth 2 (two) times daily with a meal.)    diclofenac sodium (VOLTAREN) 1 % GEL APPLY TO AFFECTED AREA 3 TIMES DAILY AS NEEDED (Patient not taking: Reported on 11/15/2020)    furosemide (LASIX) 40 MG tablet TAKE 1 TABLET(40 MG) BY MOUTH DAILY    glucose blood test strip Test up to 4 times daily    Insulin Pen Needle 32G X 4 MM MISC Use with insulin Dx code e11.65    nystatin (NYSTATIN) powder Apply 1 application topically 3 (three) times daily.    polyethylene glycol powder (GLYCOLAX/MIRALAX) 17 GM/SCOOP powder  (Patient not taking: Reported on 11/15/2020)    potassium chloride SA (KLOR-CON) 20 MEQ tablet Take 1 tablet (20 mEq total) by mouth once for 1 dose. Please d/c 2 rx for 2 times per day thanks    rosuvastatin (CRESTOR) 10 MG tablet Take 1 tablet (10 mg total) by mouth See admin instructions. MONDAY-FRIDAY. DO NOT TAKE ON SATURDAY OR SUNDAY    Semaglutide,0.25 or 0.5MG /DOS, (OZEMPIC, 0.25 OR 0.5 MG/DOSE,) 2 MG/1.5ML SOPN Inject 0.25 mg into the skin once a week.    telmisartan (MICARDIS) 80 MG tablet TAKE 1 TABLET(80 MG) BY MOUTH DAILY    TRADJENTA 5 MG TABS tablet Take 5 mg by mouth daily.    TRESIBA FLEXTOUCH 200 UNIT/ML FlexTouch Pen INJECT 36 UNITS UNDER THE SKIN DAILY. MAX TITRATION 80 UNITS (Patient taking differently: Inject 36 Units into the skin daily.)    UNABLE TO FIND lancets 30 gauge    UNABLE TO FIND Comfort  EZ Pen Needles 32 gauge x 5/32"    No facility-administered encounter medications on file as of 01/09/2021.   01-09-2021: Initiated 2023 patient assistance application for Ozempic. Called patient's nurse to inform her that application will be mailed out to sign and return to the office.  Huey Romans [facility] Clinical Pharmacist Assistant [phone]

## 2021-01-18 DIAGNOSIS — Z794 Long term (current) use of insulin: Secondary | ICD-10-CM | POA: Diagnosis not present

## 2021-01-18 DIAGNOSIS — E119 Type 2 diabetes mellitus without complications: Secondary | ICD-10-CM | POA: Diagnosis not present

## 2021-01-22 ENCOUNTER — Other Ambulatory Visit: Payer: Self-pay

## 2021-01-22 ENCOUNTER — Telehealth: Payer: Self-pay

## 2021-01-22 MED ORDER — TELMISARTAN 80 MG PO TABS: ORAL_TABLET | ORAL | 1 refills | Status: DC

## 2021-01-22 MED ORDER — FUROSEMIDE 40 MG PO TABS: ORAL_TABLET | ORAL | 1 refills | Status: DC

## 2021-01-22 MED ORDER — CARVEDILOL 6.25 MG PO TABS: 6.2500 mg | ORAL_TABLET | Freq: Two times a day (BID) | ORAL | 1 refills | Status: DC

## 2021-01-22 NOTE — Chronic Care Management (AMB) (Signed)
Chronic Care Management Pharmacy Assistant   Name: Helen Richardson  MRN: [id number] DOB: 1933-10-23   Reason for Encounter: Medication Review/ Medication coordination  Recent office visits:  12-28-2020 Bevelyn Ngo (CCM)  Recent consult visits:  None  Hospital visits:  None in previous 6 months  Medications: Outpatient Encounter Medications as of 01/22/2021  Medication Sig Note   Alcohol Swabs (ALCOHOL PREP) 70 % PADS  03/10/2013: Received from: External Pharmacy   aspirin 81 MG chewable tablet Chew 81 mg by mouth at bedtime.    Calcium Carbonate-Vitamin D (CALTRATE 600+D PO) Take 1 tablet by mouth daily.    carvedilol (COREG) 6.25 MG tablet TAKE 1 TABLET BY MOUTH TWICE DAILY (Patient taking differently: Take 6.25 mg by mouth 2 (two) times daily with a meal.)    diclofenac sodium (VOLTAREN) 1 % GEL APPLY TO AFFECTED AREA 3 TIMES DAILY AS NEEDED (Patient not taking: Reported on 11/15/2020)    furosemide (LASIX) 40 MG tablet TAKE 1 TABLET(40 MG) BY MOUTH DAILY    glucose blood test strip Test up to 4 times daily    Insulin Pen Needle 32G X 4 MM MISC Use with insulin Dx code e11.65    nystatin (NYSTATIN) powder Apply 1 application topically 3 (three) times daily.    polyethylene glycol powder (GLYCOLAX/MIRALAX) 17 GM/SCOOP powder  (Patient not taking: Reported on 11/15/2020)    potassium chloride SA (KLOR-CON) 20 MEQ tablet Take 1 tablet (20 mEq total) by mouth once for 1 dose. Please d/c 2 rx for 2 times per day thanks    rosuvastatin (CRESTOR) 10 MG tablet Take 1 tablet (10 mg total) by mouth See admin instructions. MONDAY-FRIDAY. DO NOT TAKE ON SATURDAY OR SUNDAY    Semaglutide,0.25 or 0.5MG /DOS, (OZEMPIC, 0.25 OR 0.5 MG/DOSE,) 2 MG/1.5ML SOPN Inject 0.25 mg into the skin once a week.    telmisartan (MICARDIS) 80 MG tablet TAKE 1 TABLET(80 MG) BY MOUTH DAILY    TRADJENTA 5 MG TABS tablet Take 5 mg by mouth daily.    TRESIBA FLEXTOUCH 200 UNIT/ML FlexTouch Pen INJECT 36 UNITS UNDER  THE SKIN DAILY. MAX TITRATION 80 UNITS (Patient taking differently: Inject 36 Units into the skin daily.)    UNABLE TO FIND lancets 30 gauge    UNABLE TO FIND Comfort EZ Pen Needles 32 gauge x 5/32"    No facility-administered encounter medications on file as of 01/22/2021.  Reviewed chart for medication changes ahead of medication coordination call.  No OVs, Consults, or hospital visits since last care coordination call/Pharmacist visit. (If appropriate, list visit date, provider name)  No medication changes indicated OR if recent visit, treatment plan here.  BP Readings from Last 3 Encounters:  11/15/20 122/60  11/15/20 122/60  08/02/20 122/66    Lab Results  Component Value Date   HGBA1C 9.9 (H) 11/15/2020     Patient obtains medications through Adherence Packaging  30 Days   Last adherence delivery included:  Carvedilol 6.25 mg- 1 tablet twice daily (before breakfast and evening meal) Telmisartan 80 mg - 1 tablet daily (before breakfast) Rosuvastatin 20 mg- 1 tablet daily (bedtime) Potassium 20 meq- 1 tablet daily (breakfast) Furosemide 40 mg- 1 tablet daily (before breakfast) Caltrate Bone Health (calcium) 600 mg + D3- 1 tablet daily (breakfast).  Aspirin 81 mg- 1 tablet at bedtime  Patient declined (meds) last month: Ozempic 2/1.5 ml pen- Inject 0.25 mg into skin once a week Due to receiving through Patient Assistance  Patient is due for  next adherence delivery on: 02-04-2021  Called patient and reviewed medications and coordinated delivery.  This delivery to include: Carvedilol 6.25 mg- 1 tablet twice daily (before breakfast and evening meal) Telmisartan 80 mg - 1 tablet daily (before breakfast) Rosuvastatin 20 mg- 1 tablet daily (bedtime) Potassium 20 meq- 1 tablet daily (breakfast) Furosemide 40 mg- 1 tablet daily (before breakfast) Caltrate Bone Health (calcium) 600 mg + D3- 1 tablet daily (breakfast).  Aspirin 81 mg- 1 tablet at bedtime  No short/acute fill  needed  Patient needs refills for: Lasix Telmisartan Carvedilol  Confirmed delivery date of 02-04-2021 advised patient that pharmacy will contact them the morning of delivery.  NOTES: Spoke with patient and patient's friend. Patient's friend Selena Batten confirmed medications needed and that patient has enough supply until delivery.  Care Gaps: Covid vaccine overdue PNA vac overdue Tdap overdue Shingrix overdue AWV 12-11-2021  Star Rating Drugs: Rosuvastatin 10 mg- Last filled 01-01-2021 30 Ds Upstream Telmisartan 80 MG- Last filled 01-01-2021 30 Ds Upstream Ozempic 0.5 mg- Patient assistance   Huey Romans [facility] Clinical Pharmacist Assistant [phone]

## 2021-02-04 DIAGNOSIS — E119 Type 2 diabetes mellitus without complications: Secondary | ICD-10-CM | POA: Diagnosis not present

## 2021-02-05 ENCOUNTER — Telehealth: Payer: Self-pay

## 2021-02-05 ENCOUNTER — Other Ambulatory Visit: Payer: Self-pay

## 2021-02-05 MED ORDER — TRESIBA FLEXTOUCH 200 UNIT/ML ~~LOC~~ SOPN: 36.0000 [IU] | PEN_INJECTOR | Freq: Every day | SUBCUTANEOUS | 4 refills | Status: DC

## 2021-02-05 NOTE — Chronic Care Management (AMB) (Signed)
Chronic Care Management Pharmacy Assistant   Name: Helen Richardson  MRN: [id number] DOB: [birth year]  Reason for Encounter: Disease State/ Hypertension, Diabetes  Recent office visits:  None  Recent consult visits:  None  Hospital visits:  None in previous 6 months  Medications: Outpatient Encounter Medications as of 02/05/2021  Medication Sig Note   Alcohol Swabs (ALCOHOL PREP) 70 % PADS  03/10/2013: Received from: External Pharmacy   aspirin 81 MG chewable tablet Chew 81 mg by mouth at bedtime.    Calcium Carbonate-Vitamin D (CALTRATE 600+D PO) Take 1 tablet by mouth daily.    carvedilol (COREG) 6.25 MG tablet Take 1 tablet (6.25 mg total) by mouth 2 (two) times daily.    diclofenac sodium (VOLTAREN) 1 % GEL APPLY TO AFFECTED AREA 3 TIMES DAILY AS NEEDED (Patient not taking: Reported on 11/15/2020)    furosemide (LASIX) 40 MG tablet TAKE 1 TABLET(40 MG) BY MOUTH DAILY    glucose blood test strip Test up to 4 times daily    Insulin Pen Needle 32G X 4 MM MISC Use with insulin Dx code e11.65    nystatin (NYSTATIN) powder Apply 1 application topically 3 (three) times daily.    polyethylene glycol powder (GLYCOLAX/MIRALAX) 17 GM/SCOOP powder  (Patient not taking: Reported on 11/15/2020)    potassium chloride SA (KLOR-CON) 20 MEQ tablet Take 1 tablet (20 mEq total) by mouth once for 1 dose. Please d/c 2 rx for 2 times per day thanks    rosuvastatin (CRESTOR) 10 MG tablet Take 1 tablet (10 mg total) by mouth See admin instructions. MONDAY-FRIDAY. DO NOT TAKE ON SATURDAY OR SUNDAY    Semaglutide,0.25 or 0.5MG /DOS, (OZEMPIC, 0.25 OR 0.5 MG/DOSE,) 2 MG/1.5ML SOPN Inject 0.25 mg into the skin once a week.    telmisartan (MICARDIS) 80 MG tablet TAKE 1 TABLET(80 MG) BY MOUTH DAILY    TRADJENTA 5 MG TABS tablet Take 5 mg by mouth daily.    TRESIBA FLEXTOUCH 200 UNIT/ML FlexTouch Pen INJECT 36 UNITS UNDER THE SKIN DAILY. MAX TITRATION 80 UNITS (Patient taking differently: Inject 36 Units into the  skin daily.)    UNABLE TO FIND lancets 30 gauge    UNABLE TO FIND Comfort EZ Pen Needles 32 gauge x 5/32"    No facility-administered encounter medications on file as of 02/05/2021.   Reviewed chart prior to disease state call. Spoke with patient regarding BP  Recent Office Vitals: BP Readings from Last 3 Encounters:  11/15/20 122/60  11/15/20 122/60  08/02/20 122/66   Pulse Readings from Last 3 Encounters:  11/15/20 83  11/15/20 83  08/02/20 92    Wt Readings from Last 3 Encounters:  11/15/20 200 lb 13.4 oz (91.1 kg)  11/15/20 200 lb 12.8 oz (91.1 kg)  08/02/20 195 lb 6.4 oz (88.6 kg)     Kidney Function Lab Results  Component Value Date/Time   CREATININE 0.96 11/15/2020 03:42 PM   CREATININE 1.12 (H) 08/02/2020 04:06 PM   GFRNONAA 53 (L) 05/20/2020 08:05 PM   GFRAA 59 (L) 03/14/2020 04:16 PM    BMP Latest Ref Rng & Units 11/15/2020 08/02/2020 05/20/2020  Glucose 65 - 99 mg/dL 355(D) 322(G) 254(Y)  BUN 8 - 27 mg/dL 13 14 14   Creatinine 0.57 - 1.00 mg/dL 7.06) 2.37(S)  BUN/Creat Ratio 12 - 28 14 13  -  Sodium 134 - 144 mmol/L 138 135 136  Potassium 3.5 - 5.2 mmol/L 5.4(H) 4.9 4.6  Chloride 96 - 106 mmol/L 99 98  101  CO2 20 - 29 mmol/L 25 24 27   Calcium 8.7 - 10.3 mg/dL 8.7 9.5 9.6    Current antihypertensive regimen:  Telmisartan 80 mg daily Carvedilol 6.25 mg twice daily  How often are you checking your Blood Pressure? 3-5x per week  Current home BP readings: 131/71  What recent interventions/DTPs have been made by any provider to improve Blood Pressure control since last CPP Visit: None  Any recent hospitalizations or ED visits since last visit with CPP? No What diet changes have been made to improve Blood Pressure Control?  Patient's nurse states patient drinks a lot of water and eats meals on wheels and eats snacks during the day.   What exercise is being done to improve your Blood Pressure Control?  Patient's states patient isn't very active but  walks to the bathroom and around the house.  Adherence Review: Is the patient currently on ACE/ARB medication? Yes Does the patient have >5 day gap between last estimated fill dates? No  Recent Relevant Labs: Lab Results  Component Value Date/Time   HGBA1C 9.9 (H) 11/15/2020 03:42 PM   HGBA1C 10.9 (H) 08/02/2020 04:06 PM    Kidney Function Lab Results  Component Value Date/Time   CREATININE 0.96 11/15/2020 03:42 PM   CREATININE 1.12 (H) 08/02/2020 04:06 PM   GFRNONAA 53 (L) 05/20/2020 08:05 PM   GFRAA 59 (L) 03/14/2020 04:16 PM    Current antihyperglycemic regimen:  Ozempic 0.25 mg weekly Tresiba 36 units daily  What recent interventions/DTPs have been made to improve glycemic control:  None  Have there been any recent hospitalizations or ED visits since last visit with CPP? No  Patient denies hypoglycemic symptoms  Patient denies hyperglycemic symptoms  How often are you checking your blood sugar? once daily  What are your blood sugars ranging?  Fasting: 105, 143, 122, 144 Before meals: None After meals: None Bedtime: None  During the week, how often does your blood glucose drop below 70? Never  Are you checking your feet daily/regularly? Patient's nurse states daily  Adherence Review: Is the patient currently on a STATIN medication? Yes Is the patient currently on ACE/ARB medication? Yes Does the patient have >5 day gap between last estimated fill dates? No   Care Gaps: Covid vaccine overdue PNA vac overdue Tdap overdue Shingrix overdue AWV 12-11-2021  Star Rating Drugs: Rosuvastatin 10 mg- Last filled 01-28-2021 30 Ds Upstream Telmisartan 80 MG- Last filled 01-28-2021 30 Ds Upstream Ozempic 0.5 mg- Patient assistance   01-30-2021 [facility] Clinical Pharmacist Assistant [phone]

## 2021-02-06 ENCOUNTER — Ambulatory Visit: Payer: Medicare Other | Admitting: Podiatry

## 2021-02-20 ENCOUNTER — Encounter: Payer: Self-pay | Admitting: Internal Medicine

## 2021-02-20 ENCOUNTER — Ambulatory Visit (INDEPENDENT_AMBULATORY_CARE_PROVIDER_SITE_OTHER): Payer: Medicare Other | Admitting: Internal Medicine

## 2021-02-20 ENCOUNTER — Other Ambulatory Visit: Payer: Self-pay

## 2021-02-20 VITALS — BP 124/82 | HR 88 | Temp 98.1°F | Ht 63.0 in | Wt 190.4 lb

## 2021-02-20 DIAGNOSIS — N1831 Chronic kidney disease, stage 3a: Secondary | ICD-10-CM | POA: Diagnosis not present

## 2021-02-20 DIAGNOSIS — Z6833 Body mass index (BMI) 33.0-33.9, adult: Secondary | ICD-10-CM

## 2021-02-20 DIAGNOSIS — I129 Hypertensive chronic kidney disease with stage 1 through stage 4 chronic kidney disease, or unspecified chronic kidney disease: Secondary | ICD-10-CM

## 2021-02-20 DIAGNOSIS — Z794 Long term (current) use of insulin: Secondary | ICD-10-CM

## 2021-02-20 DIAGNOSIS — E6609 Other obesity due to excess calories: Secondary | ICD-10-CM | POA: Diagnosis not present

## 2021-02-20 DIAGNOSIS — E1122 Type 2 diabetes mellitus with diabetic chronic kidney disease: Secondary | ICD-10-CM

## 2021-02-20 DIAGNOSIS — Z23 Encounter for immunization: Secondary | ICD-10-CM

## 2021-02-20 MED ORDER — TETANUS-DIPHTH-ACELL PERTUSSIS 5-2.5-18.5 LF-MCG/0.5 IM SUSP: 0.5000 mL | Freq: Once | INTRAMUSCULAR | 0 refills | Status: AC

## 2021-02-20 NOTE — Progress Notes (Signed)
Rich Brave [place],acting as a Education administrator for Maximino Greenland, MD.,have documented all relevant documentation on the behalf of Maximino Greenland, MD,as directed by  Maximino Greenland, MD while in the presence of Maximino Greenland, MD.  This visit occurred during the SARS-CoV-2 public health emergency.  Safety protocols were in place, including screening questions prior to the visit, additional usage of staff PPE, and extensive cleaning of exam room while observing appropriate contact time as indicated for disinfecting solutions.  Subjective:     Patient ID: Helen Richardson , female    DOB: October 26, 1933 , 87 y.o.   MRN: [id number]   Chief Complaint  Patient presents with   Hypertension   Diabetes    HPI  Patient presents today for a DM/HTN f/u. She reports compliance with meds. She is here with her daughter in law. Neither of them have any concerns. Pt denies headaches, chest pain and shortness of breath.   Hypertension This is a chronic problem. The current episode started today. The problem has been gradually improving since onset. The problem is uncontrolled. Pertinent negatives include no blurred vision, chest pain, palpitations or shortness of breath. Risk factors for coronary artery disease include diabetes mellitus, dyslipidemia, obesity, post-menopausal state and sedentary lifestyle. The current treatment provides moderate improvement. Compliance problems include exercise.   Diabetes She presents for her follow-up diabetic visit. She has type 2 diabetes mellitus. There are no hypoglycemic associated symptoms. Pertinent negatives for diabetes include no blurred vision, no chest pain, no polydipsia, no polyphagia and no polyuria. There are no hypoglycemic complications. She is following a generally healthy diet. Meal planning includes avoidance of concentrated sweets. She never participates in exercise. An ACE inhibitor/angiotensin II receptor blocker is being taken.    Past Medical History:   Diagnosis Date   CHF (congestive heart failure) (HCC)    Diabetes mellitus without complication (HCC)    High cholesterol    HTN (hypertension)    Obesity      Family History  Problem Relation Age of Onset   Diabetes Mother    Hypertension Mother    Hypertension Father    Stroke Father      Current Outpatient Medications:    Alcohol Swabs (ALCOHOL PREP) 70 % PADS, , Disp: , Rfl:    aspirin 81 MG chewable tablet, Chew 81 mg by mouth at bedtime., Disp: , Rfl:    Calcium Carbonate-Vitamin D (CALTRATE 600+D PO), Take 1 tablet by mouth daily., Disp: , Rfl:    carvedilol (COREG) 6.25 MG tablet, Take 1 tablet (6.25 mg total) by mouth 2 (two) times daily., Disp: 180 tablet, Rfl: 1   furosemide (LASIX) 40 MG tablet, TAKE 1 TABLET(40 MG) BY MOUTH DAILY, Disp: 90 tablet, Rfl: 1   glucose blood test strip, Test up to 4 times daily, Disp: 200 each, Rfl: 12   insulin degludec (TRESIBA FLEXTOUCH) 200 UNIT/ML FlexTouch Pen, Inject 36 Units into the skin daily. INJECT 36 UNITS UNDER THE SKIN DAILY., Disp: 15 mL, Rfl: 4   Insulin Pen Needle 32G X 4 MM MISC, Use with insulin Dx code e11.65, Disp: 100 each, Rfl: 3   nystatin (NYSTATIN) powder, Apply 1 application topically 3 (three) times daily., Disp: 15 g, Rfl: 0   potassium chloride SA (KLOR-CON) 20 MEQ tablet, Take 1 tablet (20 mEq total) by mouth once for 1 dose. Please d/c 2 rx for 2 times per day thanks, Disp: 90 tablet, Rfl: 1   rosuvastatin (CRESTOR) 10 MG tablet,  Take 1 tablet (10 mg total) by mouth See admin instructions. MONDAY-FRIDAY. DO NOT TAKE ON SATURDAY OR SUNDAY, Disp: 75 tablet, Rfl: 3   Semaglutide,0.25 or 0.5MG/DOS, (OZEMPIC, 0.25 OR 0.5 MG/DOSE,) 2 MG/1.5ML SOPN, Inject 0.25 mg into the skin once a week., Disp: 4.5 mL, Rfl: 1   telmisartan (MICARDIS) 80 MG tablet, TAKE 1 TABLET(80 MG) BY MOUTH DAILY, Disp: 90 tablet, Rfl: 1   TRADJENTA 5 MG TABS tablet, Take 5 mg by mouth daily., Disp: , Rfl:    UNABLE TO FIND, lancets 30 gauge,  Disp: , Rfl:    UNABLE TO FIND, Comfort EZ Pen Needles 32 gauge x 5/32", Disp: , Rfl:    diclofenac sodium (VOLTAREN) 1 % GEL, APPLY TO AFFECTED AREA 3 TIMES DAILY AS NEEDED (Patient not taking: Reported on 02/20/2021), Disp: 100 g, Rfl: 1   polyethylene glycol powder (GLYCOLAX/MIRALAX) 17 GM/SCOOP powder, , Disp: , Rfl:    Allergies  Allergen Reactions   Tradjenta [Linagliptin] Swelling     Review of Systems  Constitutional: Negative.   Eyes:  Negative for blurred vision.  Respiratory: Negative.  Negative for shortness of breath.   Cardiovascular: Negative.  Negative for chest pain and palpitations.  Gastrointestinal: Negative.   Endocrine: Negative for polydipsia, polyphagia and polyuria.  Genitourinary:  Negative for dyspareunia.  Neurological: Negative.   Psychiatric/Behavioral: Negative.      Today's Vitals   02/20/21 1459  BP: 124/82  Pulse: 88  Temp: 98.1 F (36.7 C)  Weight: 190 lb 6.4 oz (86.4 kg)  Height: _0  (1.6 m)  PainSc: 0-No pain   Body mass index is 33.73 kg/m.  Wt Readings from Last 3 Encounters:  02/20/21 190 lb 6.4 oz (86.4 kg)  11/15/20 200 lb 13.4 oz (91.1 kg)  11/15/20 200 lb 12.8 oz (91.1 kg)     Objective:  Physical Exam Vitals and nursing note reviewed.  Constitutional:      Appearance: Normal appearance. She is obese.  HENT:     Head: Normocephalic and atraumatic.     Nose:     Comments: Masked     Mouth/Throat:     Comments: Masked Eyes:     Extraocular Movements: Extraocular movements intact.  Cardiovascular:     Rate and Rhythm: Normal rate and regular rhythm.     Heart sounds: Normal heart sounds.  Pulmonary:     Effort: Pulmonary effort is normal.     Breath sounds: Normal breath sounds.  Abdominal:     General: Bowel sounds are normal.     Palpations: Abdomen is soft.  Musculoskeletal:     Cervical back: Normal range of motion.  Skin:    General: Skin is warm.  Neurological:     General: No focal deficit present.      Mental Status: She is alert.  Psychiatric:        Mood and Affect: Mood normal.        Behavior: Behavior normal.        Assessment And Plan:     1. Hypertensive nephropathy Comments: Chronic, well controlled. No med changes. Advised to avoid adding salt to her foods. Importance of dietary compliance was stressed to the patient.   2. Type 2 diabetes mellitus with stage 3a chronic kidney disease, with long-term current use of insulin (HCC) Comments: Chronic, unfortunately she has not been taking Ozempic. I will resume at dose 0.82m weekly. Importance of med compliance stressed to patient. I will check labs as listed below  and adjust meds as needed.  - BMP8+eGFR - Hemoglobin A1c  3. Class 1 obesity due to excess calories with serious comorbidity and body mass index (BMI) of 33.0 to 33.9 in adult Comments: She is encouraged to strive to lose 10-15 lbs to decrease cardiac risk. Advised to perform chair exercises while watching TV.   4. Immunization due Comments: I will send Tdap (Boostrix) to her local pharmacy.    Patient was given opportunity to ask questions. Patient verbalized understanding of the plan and was able to repeat key elements of the plan. All questions were answered to their satisfaction.   I, Maximino Greenland, MD, have reviewed all documentation for this visit. The documentation on 02/20/21 for the exam, diagnosis, procedures, and orders are all accurate and complete.   IF YOU HAVE BEEN REFERRED TO A SPECIALIST, IT MAY TAKE 1-2 WEEKS TO SCHEDULE/PROCESS THE REFERRAL. IF YOU HAVE NOT HEARD FROM US/SPECIALIST IN TWO WEEKS, PLEASE GIVE [place] A CALL AT [phone].   THE PATIENT IS ENCOURAGED TO PRACTICE SOCIAL DISTANCING DUE TO THE COVID-19 PANDEMIC.

## 2021-02-20 NOTE — Patient Instructions (Signed)

## 2021-02-21 LAB — BMP8+EGFR
BUN/Creatinine Ratio: 15 (ref 12–28)
BUN: 14 mg/dL (ref 8–27)
CO2: 26 mmol/L (ref 20–29)
Calcium: 9.5 mg/dL (ref 8.7–10.3)
Chloride: 96 mmol/L (ref 96–106)
Creatinine, Ser: 0.93 mg/dL (ref 0.57–1.00)
Glucose: 139 mg/dL — ABNORMAL HIGH (ref 70–99)
Potassium: 5 mmol/L (ref 3.5–5.2)
Sodium: 135 mmol/L (ref 134–144)
eGFR: 60 mL/min/{1.73_m2} (ref >59)

## 2021-02-21 LAB — HEMOGLOBIN A1C
Est. average glucose Bld gHb Est-mCnc: 217 mg/dL
Hgb A1c MFr Bld: 9.2 % — ABNORMAL HIGH (ref 4.8–5.6)

## 2021-02-22 ENCOUNTER — Telehealth: Payer: Medicare Other

## 2021-02-22 ENCOUNTER — Telehealth: Payer: Self-pay

## 2021-02-22 NOTE — Telephone Encounter (Signed)
°  Care Management   Follow Up Note   02/22/2021 Name: Helen Richardson MRN: [id number] DOB: Jun 06, 1933   Referred by: Dorothyann Peng, MD Reason for referral : Chronic Care Management (RN CM Follow up call )   An unsuccessful telephone outreach was attempted today. The patient was referred to the case management team for assistance with care management and care coordination.   Follow Up Plan: A HIPPA compliant phone message was left for the patient providing contact information and requesting a return call.   Delsa Sale, RN, BSN, CCM Care Management Coordinator [facility] Care Management/[facility]  Direct Phone: [phone]

## 2021-02-26 ENCOUNTER — Telehealth: Payer: Self-pay

## 2021-02-26 ENCOUNTER — Ambulatory Visit: Payer: Medicare Other

## 2021-02-26 NOTE — Chronic Care Management (AMB) (Signed)
Chronic Care Management Pharmacy Assistant   Name: Helen Richardson  MRN: [id number] DOB: [birth year]   Reason for Encounter: Medication Review/ Medication coordination   Recent office visits:  02-20-2021 Dorothyann Peng, MD. Tdap ordered.Glucose= 139, A1C= 9.2  Recent consult visits:  None  Hospital visits:  None in previous 6 months  Medications: Outpatient Encounter Medications as of 02/26/2021  Medication Sig Note   Alcohol Swabs (ALCOHOL PREP) 70 % PADS  03/10/2013: Received from: External Pharmacy   aspirin 81 MG chewable tablet Chew 81 mg by mouth at bedtime.    Calcium Carbonate-Vitamin D (CALTRATE 600+D PO) Take 1 tablet by mouth daily.    carvedilol (COREG) 6.25 MG tablet Take 1 tablet (6.25 mg total) by mouth 2 (two) times daily.    diclofenac sodium (VOLTAREN) 1 % GEL APPLY TO AFFECTED AREA 3 TIMES DAILY AS NEEDED (Patient not taking: Reported on 02/20/2021)    furosemide (LASIX) 40 MG tablet TAKE 1 TABLET(40 MG) BY MOUTH DAILY    glucose blood test strip Test up to 4 times daily    insulin degludec (TRESIBA FLEXTOUCH) 200 UNIT/ML FlexTouch Pen Inject 36 Units into the skin daily. INJECT 36 UNITS UNDER THE SKIN DAILY.    Insulin Pen Needle 32G X 4 MM MISC Use with insulin Dx code e11.65    nystatin (NYSTATIN) powder Apply 1 application topically 3 (three) times daily.    polyethylene glycol powder (GLYCOLAX/MIRALAX) 17 GM/SCOOP powder  (Patient not taking: Reported on 11/15/2020)    potassium chloride SA (KLOR-CON) 20 MEQ tablet Take 1 tablet (20 mEq total) by mouth once for 1 dose. Please d/c 2 rx for 2 times per day thanks    rosuvastatin (CRESTOR) 10 MG tablet Take 1 tablet (10 mg total) by mouth See admin instructions. MONDAY-FRIDAY. DO NOT TAKE ON SATURDAY OR SUNDAY    Semaglutide,0.25 or 0.5MG /DOS, (OZEMPIC, 0.25 OR 0.5 MG/DOSE,) 2 MG/1.5ML SOPN Inject 0.25 mg into the skin once a week.    telmisartan (MICARDIS) 80 MG tablet TAKE 1 TABLET(80 MG) BY MOUTH DAILY     TRADJENTA 5 MG TABS tablet Take 5 mg by mouth daily.    UNABLE TO FIND lancets 30 gauge    UNABLE TO FIND Comfort EZ Pen Needles 32 gauge x 5/32"    No facility-administered encounter medications on file as of 02/26/2021.  Reviewed chart for medication changes ahead of medication coordination call.  No OVs, Consults, or hospital visits since last care coordination call/Pharmacist visit. (If appropriate, list visit date, provider name)  No medication changes indicated OR if recent visit, treatment plan here.  BP Readings from Last 3 Encounters:  02/20/21 124/82  11/15/20 122/60  11/15/20 122/60    Lab Results  Component Value Date   HGBA1C 9.2 (H) 02/20/2021     Patient obtains medications through Adherence Packaging  30 Days   Last adherence delivery included:  Carvedilol 6.25 mg- 1 tablet twice daily (before breakfast and evening meal) Telmisartan 80 mg - 1 tablet daily (before breakfast) Rosuvastatin 20 mg- 1 tablet daily Monday-Friday (bedtime) Potassium 20 meq- 1 tablet daily (breakfast) Furosemide 40 mg- 1 tablet daily (before breakfast) Caltrate Bone Health (calcium) 600 mg + D3- 1 tablet daily (breakfast).  Aspirin 81 mg- 1 tablet at bedtime  Patient declined (meds) last month: None  Patient is due for next adherence delivery on: 03-07-2020  Called patient and reviewed medications and coordinated delivery.  This delivery to include: Caltrate Bone Health (calcium) 600 mg +  D3- 1 tablet daily (breakfast).  Rosuvastatin 20 mg- 1 tablet daily Monday-Friday (bedtime) Aspirin 81 mg- 1 tablet at bedtime Potassium 20 meq- 1 tablet daily (breakfast) Carvedilol 6.25 mg- 1 tablet twice daily (before breakfast and evening meal) Telmisartan 80 mg - 1 tablet daily (before breakfast) Furosemide 40 mg- 1 tablet daily (before breakfast)  No short/acute fill needed  Patient declined the following medications: None  Patient needs refills for: None  Confirmed delivery date  of 03-07-2020 advised patient that pharmacy will contact them the morning of delivery.  Care Gaps: Covid vaccine overdue PNA vac overdue Tdap overdue Shingrix overdue AWV 12-11-2021  Star Rating Drugs: Rosuvastatin 10 mg- Last filled 02-04-2021 [facility] Telmisartan 80 mg- Last filled 02-04-2021 [facility] Ozempic 0.5 mg- Patient assistance   Huey Romans [facility] Clinical Pharmacist Assistant [phone]

## 2021-02-28 ENCOUNTER — Telehealth: Payer: Self-pay

## 2021-02-28 ENCOUNTER — Telehealth: Payer: Medicare Other

## 2021-02-28 NOTE — Telephone Encounter (Signed)
°  Care Management   Follow Up Note   02/28/2021 Name: Helen Richardson MRN: [id number] DOB: 24-Feb-1934   Referred by: Dorothyann Peng, MD Reason for referral : Chronic Care Management (RN CM Follow up call - 2nd attempt )   A second unsuccessful telephone outreach was attempted today. The patient was referred to the case management team for assistance with care management and care coordination.   Follow Up Plan: A HIPPA compliant phone message was left for the patient providing contact information and requesting a return call.    Delsa Sale, RN, BSN, CCM Care Management Coordinator [facility] Care Management/[facility]  Direct Phone: [phone]

## 2021-03-06 ENCOUNTER — Telehealth: Payer: Self-pay

## 2021-03-06 NOTE — Chronic Care Management (AMB) (Signed)
Chronic Care Management Pharmacy Assistant   Name: MYESHIA FOJTIK  MRN: [id number] DOB: September 23, 1933  Reason for Encounter: Medication Review  03/06/2021- Received a message from [facility]: Attempting to process patient prescription for her delivery going out on 03/07/21 and her insurance is not working and when I run the eligibility check it is saying patient not found, do you guys have any updated insurance info for this patient? Checked patients chart for an updated insurance card, none on file. Called patient, inquired if she changed her insurance plans this year, patient was unaware of any insurance changes, she stated she has had the same insurance for years. Inquired if she received any calls or mail regarding her insurance, informed that her medications are being declined with her insurance stating inactive account. Patient was unaware of any changes. Called [employer] and per [facility] coverage ended 03/02/2021. Called patient's GranddaughterMorrie Sheldon to ask if patients insurance has changed, granddaughter unaware of any change and wasn't sure if patient should have signed up for coverage this year, she hasn't had to do it before. I advised daughter to contact [employer] and talk with an Herbalist to get patient back enrolled. Granddaughter asked if medications that are to be delivered just the vitamins or are they her chronic condition medications if if so how much would it cost for cash price. Morrie Sheldon aware that the medications due to be delivered were her Furosemide, Carvedilol, Atorvastatin, Potassium, Telmisartan, Calcium and Aspirin. I will call her back once pharmacy gives me cash price on these medications, we are holding delivery until Friday or until we know how much medications will be.   03/07/2021- Received message back from Pharmacy technician at [employer], cash prices for the medications would be:   calcium: $2, aspirin: $1, rosuvastatin: $8,  potassium: $5, carvedilol: $4, furosemide: $4, and telmisartan: $13. so $37 for all. Pharmacy technician was able to contact granddaughter and they would like to go ahead and fill and pay cash price. Medication delivered to patient 03/08/2021.   Medications: Outpatient Encounter Medications as of 03/06/2021  Medication Sig Note   Alcohol Swabs (ALCOHOL PREP) 70 % PADS  03/10/2013: Received from: External Pharmacy   aspirin 81 MG chewable tablet Chew 81 mg by mouth at bedtime.    Calcium Carbonate-Vitamin D (CALTRATE 600+D PO) Take 1 tablet by mouth daily.    carvedilol (COREG) 6.25 MG tablet Take 1 tablet (6.25 mg total) by mouth 2 (two) times daily.    diclofenac sodium (VOLTAREN) 1 % GEL APPLY TO AFFECTED AREA 3 TIMES DAILY AS NEEDED (Patient not taking: Reported on 02/20/2021)    furosemide (LASIX) 40 MG tablet TAKE 1 TABLET(40 MG) BY MOUTH DAILY    glucose blood test strip Test up to 4 times daily    insulin degludec (TRESIBA FLEXTOUCH) 200 UNIT/ML FlexTouch Pen Inject 36 Units into the skin daily. INJECT 36 UNITS UNDER THE SKIN DAILY.    Insulin Pen Needle 32G X 4 MM MISC Use with insulin Dx code e11.65    nystatin (NYSTATIN) powder Apply 1 application topically 3 (three) times daily.    polyethylene glycol powder (GLYCOLAX/MIRALAX) 17 GM/SCOOP powder  (Patient not taking: Reported on 11/15/2020)    potassium chloride SA (KLOR-CON) 20 MEQ tablet Take 1 tablet (20 mEq total) by mouth once for 1 dose. Please d/c 2 rx for 2 times per day thanks    rosuvastatin (CRESTOR) 10 MG tablet Take 1 tablet (10 mg total) by mouth  See admin instructions. MONDAY-FRIDAY. DO NOT TAKE ON SATURDAY OR SUNDAY    Semaglutide,0.25 or 0.5MG /DOS, (OZEMPIC, 0.25 OR 0.5 MG/DOSE,) 2 MG/1.5ML SOPN Inject 0.25 mg into the skin once a week.    telmisartan (MICARDIS) 80 MG tablet TAKE 1 TABLET(80 MG) BY MOUTH DAILY    TRADJENTA 5 MG TABS tablet Take 5 mg by mouth daily.    UNABLE TO FIND lancets 30 gauge    UNABLE TO FIND  Comfort EZ Pen Needles 32 gauge x 5/32"    No facility-administered encounter medications on file as of 03/06/2021.   [name], CMA Clinical Pharmacist Assistant [phone]

## 2021-03-19 DIAGNOSIS — E119 Type 2 diabetes mellitus without complications: Secondary | ICD-10-CM | POA: Diagnosis not present

## 2021-03-20 ENCOUNTER — Ambulatory Visit: Payer: Medicare Other | Admitting: Podiatry

## 2021-03-26 ENCOUNTER — Telehealth: Payer: Self-pay

## 2021-03-26 NOTE — Chronic Care Management (AMB) (Addendum)
Chronic Care Management Pharmacy Assistant   Name: Helen Richardson  MRN: [id number] DOB: 1933-04-11  Reason for Encounter: Medication Review/ Acute delivery/ Medication coordination  Recent office visits:  None  Recent consult visits:  None   Hospital visits:  None in previous 6 months  Medications: Outpatient Encounter Medications as of 03/26/2021  Medication Sig Note   Alcohol Swabs (ALCOHOL PREP) 70 % PADS  03/10/2013: Received from: External Pharmacy   aspirin 81 MG chewable tablet Chew 81 mg by mouth at bedtime.    Calcium Carbonate-Vitamin D (CALTRATE 600+D PO) Take 1 tablet by mouth daily.    carvedilol (COREG) 6.25 MG tablet Take 1 tablet (6.25 mg total) by mouth 2 (two) times daily.    diclofenac sodium (VOLTAREN) 1 % GEL APPLY TO AFFECTED AREA 3 TIMES DAILY AS NEEDED (Patient not taking: Reported on 02/20/2021)    furosemide (LASIX) 40 MG tablet TAKE 1 TABLET(40 MG) BY MOUTH DAILY    glucose blood test strip Test up to 4 times daily    insulin degludec (TRESIBA FLEXTOUCH) 200 UNIT/ML FlexTouch Pen Inject 36 Units into the skin daily. INJECT 36 UNITS UNDER THE SKIN DAILY.    Insulin Pen Needle 32G X 4 MM MISC Use with insulin Dx code e11.65    nystatin (NYSTATIN) powder Apply 1 application topically 3 (three) times daily.    polyethylene glycol powder (GLYCOLAX/MIRALAX) 17 GM/SCOOP powder  (Patient not taking: Reported on 11/15/2020)    potassium chloride SA (KLOR-CON) 20 MEQ tablet Take 1 tablet (20 mEq total) by mouth once for 1 dose. Please d/c 2 rx for 2 times per day thanks    rosuvastatin (CRESTOR) 10 MG tablet Take 1 tablet (10 mg total) by mouth See admin instructions. MONDAY-FRIDAY. DO NOT TAKE ON SATURDAY OR SUNDAY    Semaglutide,0.25 or 0.5MG /DOS, (OZEMPIC, 0.25 OR 0.5 MG/DOSE,) 2 MG/1.5ML SOPN Inject 0.25 mg into the skin once a week.    telmisartan (MICARDIS) 80 MG tablet TAKE 1 TABLET(80 MG) BY MOUTH DAILY    TRADJENTA 5 MG TABS tablet Take 5 mg by mouth daily.     UNABLE TO FIND lancets 30 gauge    UNABLE TO FIND Comfort EZ Pen Needles 32 gauge x 5/32"    No facility-administered encounter medications on file as of 03/26/2021.  Reviewed chart for medication changes ahead of medication coordination call.  No OVs, Consults, or hospital visits since last care coordination call/Pharmacist visit.  BP Readings from Last 3 Encounters:  02/20/21 124/82  11/15/20 122/60  11/15/20 122/60    Lab Results  Component Value Date   HGBA1C 9.2 (H) 02/20/2021     Patient obtains medications through Adherence Packaging  30 Days   Last adherence delivery included:  Caltrate Bone Health (calcium) 600 mg + D3- 1 tablet daily (breakfast).  Rosuvastatin 20 mg- 1 tablet daily Monday-Friday (bedtime) Aspirin 81 mg- 1 tablet at bedtime Potassium 20 meq- 1 tablet daily (breakfast) Carvedilol 6.25 mg- 1 tablet twice daily (before breakfast and evening meal) Telmisartan 80 mg - 1 tablet daily (before breakfast) Furosemide 40 mg- 1 tablet daily (before breakfast)  Patient's nurse declined (meds) last month: None  Patient is due for next adherence delivery on: 04-08-2021  Called patient's nurse and reviewed medications and coordinated delivery.  This delivery to include: Caltrate Bone Health (calcium) 600 mg + D3- 1 tablet daily (breakfast).  Rosuvastatin 20 mg- 1 tablet daily Monday-Friday (bedtime) Aspirin 81 mg- 1 tablet at bedtime Potassium 20  meq- 1 tablet daily (breakfast) Carvedilol 6.25 mg- 1 tablet twice daily (before breakfast and evening meal) Telmisartan 80 mg - 1 tablet daily (before breakfast) Furosemide 40 mg- 1 tablet daily (before breakfast)  No Short or acute fill needed  Patient declined the following medications: None  Patient needs refills for: None  Confirmed delivery date of 04-08-2021 advised patient's nurse that pharmacy will contact them the morning of delivery.  03-26-2021: Patient's nurse called stating she is in need of  tresiba because she will be out before 04-08-2021 delivery. [facility] was informed and stated no acute form is needed and she will write a intake for delivery on 03-27-2021. Patient is also on her last Ozempic dose. Called [employer] Patient assistance and the automated system stated information was missing. Informed Helen Richardson PTM   Huey Romans [facility] Clinical Pharmacist Assistant [phone]

## 2021-03-27 ENCOUNTER — Other Ambulatory Visit: Payer: Self-pay

## 2021-03-27 MED ORDER — TRESIBA FLEXTOUCH 200 UNIT/ML ~~LOC~~ SOPN: 40.0000 [IU] | PEN_INJECTOR | Freq: Every day | SUBCUTANEOUS | 4 refills | Status: DC

## 2021-03-28 ENCOUNTER — Ambulatory Visit (INDEPENDENT_AMBULATORY_CARE_PROVIDER_SITE_OTHER): Payer: Medicare PPO

## 2021-03-28 ENCOUNTER — Telehealth: Payer: Medicare Other

## 2021-03-28 DIAGNOSIS — I509 Heart failure, unspecified: Secondary | ICD-10-CM

## 2021-03-28 DIAGNOSIS — E1122 Type 2 diabetes mellitus with diabetic chronic kidney disease: Secondary | ICD-10-CM

## 2021-03-28 DIAGNOSIS — I10 Essential (primary) hypertension: Secondary | ICD-10-CM

## 2021-03-28 DIAGNOSIS — N1831 Chronic kidney disease, stage 3a: Secondary | ICD-10-CM

## 2021-03-28 NOTE — Progress Notes (Signed)
This encounter was created in error - please disregard.

## 2021-03-28 NOTE — Chronic Care Management (AMB) (Signed)
°  Care Management   Follow Up Note   03/28/2021 Name: DEEKSHA MCLELAND MRN: [id number] DOB: 06-Mar-1933   Referred by: Glendale Chard, MD Reason for referral : Chronic Care Management (RN CM Follow up call - 3rd attempt )   Third unsuccessful telephone outreach was attempted today. The patient was referred to the case management team for assistance with care management and care coordination. The patient's primary care provider has been notified of our unsuccessful attempts to make or maintain contact with the patient. The care management team is pleased to engage with this patient at any time in the future should he/she be interested in assistance from the care management team.   Follow Up Plan: We have been unable to make contact with the patient for follow up. The care management team is available to follow up with the patient after provider conversation with the patient regarding recommendation for care management engagement and subsequent re-referral to the care management team.   Barb Merino, RN, BSN, CCM Care Management Coordinator [facility]  Direct Phone: [phone]

## 2021-03-28 NOTE — Telephone Encounter (Signed)
This encounter was created in error - please disregard.

## 2021-04-02 DIAGNOSIS — I509 Heart failure, unspecified: Secondary | ICD-10-CM

## 2021-04-02 DIAGNOSIS — N1831 Chronic kidney disease, stage 3a: Secondary | ICD-10-CM

## 2021-04-02 DIAGNOSIS — Z794 Long term (current) use of insulin: Secondary | ICD-10-CM

## 2021-04-02 DIAGNOSIS — I10 Essential (primary) hypertension: Secondary | ICD-10-CM

## 2021-04-02 DIAGNOSIS — E1122 Type 2 diabetes mellitus with diabetic chronic kidney disease: Secondary | ICD-10-CM

## 2021-04-09 ENCOUNTER — Telehealth: Payer: Self-pay

## 2021-04-09 NOTE — Chronic Care Management (AMB) (Signed)
04/09/21- Patient assistance medication Ozempic from [employer] patient assistance program arrived at PCP office awaiting pick up. Called patient she is aware, she would like for me to call granddaughter Morrie Sheldon. Lennox Laity, she picked up Ozempic today.   [name], CMA Clinical Pharmacist Assistant [phone]

## 2021-04-19 DIAGNOSIS — E1165 Type 2 diabetes mellitus with hyperglycemia: Secondary | ICD-10-CM | POA: Diagnosis not present

## 2021-04-25 ENCOUNTER — Telehealth: Payer: Self-pay

## 2021-04-25 NOTE — Chronic Care Management (AMB) (Signed)
Chronic Care Management Pharmacy Assistant   Name: Helen Richardson  MRN: [id number] DOB: 08/15/1933  Reason for Encounter: Medication Review/ Medication coordination  Recent office visits:  03-28-2021 Little, Karma Lew, RN (CCM)  Recent consult visits:  None  Hospital visits:  None in previous 6 months  Medications: Outpatient Encounter Medications as of 04/25/2021  Medication Sig Note   Alcohol Swabs (ALCOHOL PREP) 70 % PADS  03/10/2013: Received from: External Pharmacy   aspirin 81 MG chewable tablet Chew 81 mg by mouth at bedtime.    Calcium Carbonate-Vitamin D (CALTRATE 600+D PO) Take 1 tablet by mouth daily.    carvedilol (COREG) 6.25 MG tablet Take 1 tablet (6.25 mg total) by mouth 2 (two) times daily.    diclofenac sodium (VOLTAREN) 1 % GEL APPLY TO AFFECTED AREA 3 TIMES DAILY AS NEEDED (Patient not taking: Reported on 02/20/2021)    furosemide (LASIX) 40 MG tablet TAKE 1 TABLET(40 MG) BY MOUTH DAILY    glucose blood test strip Test up to 4 times daily    insulin degludec (TRESIBA FLEXTOUCH) 200 UNIT/ML FlexTouch Pen Inject 40 Units into the skin daily. INJECT 36 UNITS UNDER THE SKIN DAILY.    Insulin Pen Needle 32G X 4 MM MISC Use with insulin Dx code e11.65    nystatin (NYSTATIN) powder Apply 1 application topically 3 (three) times daily.    polyethylene glycol powder (GLYCOLAX/MIRALAX) 17 GM/SCOOP powder  (Patient not taking: Reported on 11/15/2020)    potassium chloride SA (KLOR-CON) 20 MEQ tablet Take 1 tablet (20 mEq total) by mouth once for 1 dose. Please d/c 2 rx for 2 times per day thanks    rosuvastatin (CRESTOR) 10 MG tablet Take 1 tablet (10 mg total) by mouth See admin instructions. MONDAY-FRIDAY. DO NOT TAKE ON SATURDAY OR SUNDAY    Semaglutide,0.25 or 0.5MG /DOS, (OZEMPIC, 0.25 OR 0.5 MG/DOSE,) 2 MG/1.5ML SOPN Inject 0.25 mg into the skin once a week.    telmisartan (MICARDIS) 80 MG tablet TAKE 1 TABLET(80 MG) BY MOUTH DAILY    TRADJENTA 5 MG TABS tablet Take 5 mg  by mouth daily.    UNABLE TO FIND lancets 30 gauge    UNABLE TO FIND Comfort EZ Pen Needles 32 gauge x 5/32"    No facility-administered encounter medications on file as of 04/25/2021.  Reviewed chart for medication changes ahead of medication coordination call.  No OVs, Consults, or hospital visits since last care coordination call/Pharmacist visit.  No medication changes indicated OR if recent visit, treatment plan here.  BP Readings from Last 3 Encounters:  02/20/21 124/82  11/15/20 122/60  11/15/20 122/60    Lab Results  Component Value Date   HGBA1C 9.2 (H) 02/20/2021     Patient obtains medications through Adherence Packaging  30 Days   Last adherence delivery included:  Caltrate Bone Health (calcium) 600 mg + D3- 1 tablet daily (breakfast).  Rosuvastatin 20 mg- 1 tablet daily Monday-Friday (bedtime) Aspirin 81 mg- 1 tablet at bedtime Potassium 20 meq- 1 tablet daily (breakfast) Carvedilol 6.25 mg- 1 tablet twice daily (before breakfast and evening meal) Telmisartan 80 mg - 1 tablet daily (before breakfast) Furosemide 40 mg- 1 tablet daily (before breakfast)   Patient declined (meds) last month: None  Patient is due for next adherence delivery on: 05-07-2021  Called patient and reviewed medications and coordinated delivery.  This delivery to include: Caltrate Bone Health (calcium) 600 mg + D3- 1 tablet daily (breakfast).  Aspirin 81 mg- 1  tablet at bedtime Potassium 20 meq- 1 tablet daily (breakfast) Carvedilol 6.25 mg- 1 tablet twice daily (before breakfast and evening meal) Rosuvastatin 20 mg- 1 tablet daily Monday-Friday (bedtime) Furosemide 40 mg- 1 tablet daily (before breakfast) Telmisartan 80 mg - 1 tablet daily (before breakfast) Tresiba 40 units daily  No short/acute fill needed  Patient declined the following medications: None  Patient needs refills for: None  Confirmed delivery date of 03-07-203 advised patient's grand daughter that pharmacy  will contact them the morning of delivery.  NOTES: Patient's old nurse [name] informed me that she no longer works with patient and to contact patient's grand daughter going forward.  Care Gaps: Covid vaccine overdue PNA vac overdue Tdap overdue Shingrix overdue AWV 12-11-2021  Star Rating Drugs: Rosuvastatin 10 mg- Last filled 04-01-2021 [facility] Telmisartan 80 mg- Last filled 04-01-2021 30 [facility] Ozempic 0.5 mg- Patient assistance   Huey Romans [facility] Clinical Pharmacist Assistant [phone]

## 2021-04-29 DIAGNOSIS — E119 Type 2 diabetes mellitus without complications: Secondary | ICD-10-CM | POA: Diagnosis not present

## 2021-04-29 DIAGNOSIS — Z794 Long term (current) use of insulin: Secondary | ICD-10-CM | POA: Diagnosis not present

## 2021-05-19 DIAGNOSIS — E1165 Type 2 diabetes mellitus with hyperglycemia: Secondary | ICD-10-CM | POA: Diagnosis not present

## 2021-05-24 DIAGNOSIS — E1151 Type 2 diabetes mellitus with diabetic peripheral angiopathy without gangrene: Secondary | ICD-10-CM | POA: Diagnosis not present

## 2021-05-24 DIAGNOSIS — E669 Obesity, unspecified: Secondary | ICD-10-CM | POA: Diagnosis not present

## 2021-05-24 DIAGNOSIS — Z6836 Body mass index (BMI) 36.0-36.9, adult: Secondary | ICD-10-CM | POA: Diagnosis not present

## 2021-05-24 DIAGNOSIS — Z7409 Other reduced mobility: Secondary | ICD-10-CM | POA: Diagnosis not present

## 2021-05-24 DIAGNOSIS — I509 Heart failure, unspecified: Secondary | ICD-10-CM | POA: Diagnosis not present

## 2021-05-24 DIAGNOSIS — I110 Hypertensive heart disease with heart failure: Secondary | ICD-10-CM | POA: Diagnosis not present

## 2021-05-24 DIAGNOSIS — Z7982 Long term (current) use of aspirin: Secondary | ICD-10-CM | POA: Diagnosis not present

## 2021-05-24 DIAGNOSIS — E785 Hyperlipidemia, unspecified: Secondary | ICD-10-CM | POA: Diagnosis not present

## 2021-05-24 DIAGNOSIS — Z794 Long term (current) use of insulin: Secondary | ICD-10-CM | POA: Diagnosis not present

## 2021-05-27 ENCOUNTER — Telehealth: Payer: Self-pay

## 2021-05-27 NOTE — Chronic Care Management (AMB) (Signed)
? ? ?Chronic Care Management ?Pharmacy Assistant  ? ?Name: Helen Richardson  MRN: [id number] DOB: 07-Jun-1933 ? ? ?Reason for Encounter: Medication Review/ Medication Coordination ? ?Recent office visits:  ?None ? ?Recent consult visits:  ?None ? ?Hospital visits:  ?None in previous 6 months ? ?Medications: ?Outpatient Encounter Medications as of 05/27/2021  ?Medication Sig Note  ? Alcohol Swabs (ALCOHOL PREP) 70 % PADS  03/10/2013: Received from: External Pharmacy  ? aspirin 81 MG chewable tablet Chew 81 mg by mouth at bedtime.   ? Calcium Carbonate-Vitamin D (CALTRATE 600+D PO) Take 1 tablet by mouth daily.   ? carvedilol (COREG) 6.25 MG tablet Take 1 tablet (6.25 mg total) by mouth 2 (two) times daily.   ? diclofenac sodium (VOLTAREN) 1 % GEL APPLY TO AFFECTED AREA 3 TIMES DAILY AS NEEDED (Patient not taking: Reported on 02/20/2021)   ? furosemide (LASIX) 40 MG tablet TAKE 1 TABLET(40 MG) BY MOUTH DAILY   ? glucose blood test strip Test up to 4 times daily   ? insulin degludec (TRESIBA FLEXTOUCH) 200 UNIT/ML FlexTouch Pen Inject 40 Units into the skin daily. INJECT 36 UNITS UNDER THE SKIN DAILY.   ? Insulin Pen Needle 32G X 4 MM MISC Use with insulin Dx code e11.65   ? nystatin (NYSTATIN) powder Apply 1 application topically 3 (three) times daily.   ? polyethylene glycol powder (GLYCOLAX/MIRALAX) 17 GM/SCOOP powder  (Patient not taking: Reported on 11/15/2020)   ? potassium chloride SA (KLOR-CON) 20 MEQ tablet Take 1 tablet (20 mEq total) by mouth once for 1 dose. Please d/c 2 rx for 2 times per day thanks   ? rosuvastatin (CRESTOR) 10 MG tablet Take 1 tablet (10 mg total) by mouth See admin instructions. MONDAY-FRIDAY. DO NOT TAKE ON SATURDAY OR SUNDAY   ? Semaglutide,0.25 or 0.5MG /DOS, (OZEMPIC, 0.25 OR 0.5 MG/DOSE,) 2 MG/1.5ML SOPN Inject 0.25 mg into the skin once a week.   ? telmisartan (MICARDIS) 80 MG tablet TAKE 1 TABLET(80 MG) BY MOUTH DAILY   ? TRADJENTA 5 MG TABS tablet Take 5 mg by mouth daily.   ? UNABLE TO  FIND lancets 30 gauge   ? UNABLE TO FIND Comfort EZ Pen Needles 32 gauge x 5/32"   ? ?No facility-administered encounter medications on file as of 05/27/2021.  ?Reviewed chart for medication changes ahead of medication coordination call. ? ?No OVs, Consults, or hospital visits since last care coordination call/Pharmacist visit.  ? ?No medication changes indicated OR if recent visit, treatment plan here. ? ?BP Readings from Last 3 Encounters:  ?02/20/21 124/82  ?11/15/20 122/60  ?11/15/20 122/60  ?  ?Lab Results  ?Component Value Date  ? HGBA1C 9.2 (H) 02/20/2021  ?  ? ?Patient obtains medications through Adherence Packaging  30 Days  ? ?Last adherence delivery included:  ?Caltrate Bone Health (calcium) 600 mg + D3- 1 tablet daily (breakfast).  ?Aspirin 81 mg- 1 tablet at bedtime ?Potassium 20 meq- 1 tablet daily (breakfast) ?Carvedilol 6.25 mg- 1 tablet twice daily (before breakfast and evening meal) ?Rosuvastatin 20 mg- 1 tablet daily Monday-Friday (bedtime) ?Furosemide 40 mg- 1 tablet daily (before breakfast) ?Telmisartan 80 mg - 1 tablet daily (before breakfast) ?Tresiba 40 units daily ? ?Patient declined (meds) last month: ?None ? ?Patient is due for next adherence delivery on: 06-06-2021 ? ?Called patient and reviewed medications and coordinated delivery. ? ?This delivery to include: ?Aspirin 81 mg- 1 tablet at bedtime ?Potassium 20 meq- 1 tablet daily (breakfast) ?Rosuvastatin 20 mg-  1 tablet daily Monday-Friday (bedtime) ?Furosemide 40 mg- 1 tablet daily (before breakfast) ?Telmisartan 80 mg - 1 tablet daily (before breakfast) ?Carvedilol 6.25 mg- 1 tablet twice daily (before breakfast and evening meal) ?Caltrate Bone Health (calcium) 600 mg + D3- 1 tablet daily (breakfast).  ? ?Patient will need a short fill of (med), prior to adherence delivery. (To align with sync date or if PRN med) ? ?Coordinated acute fill for: 06-18-2021 ?Tresiba 40 units  ?Pen needles ? ?Patient declined the following  medications: ?None ? ?Patient needs refills for: ?None ? ?Confirmed delivery date of 06-06-2021, advised patient that pharmacy will contact them the morning of delivery. ? ? ?Care Gaps: ?Covid vaccine overdue ?PNA vac overdue ?Tdap overdue ?Shingrix overdue ?AWV 12-11-2021 ? ?Star Rating Drugs: ?Rosuvastatin 10 mg- Last filled 02-28--2023 ?30 DS [facility] ?Telmisartan 80 mg- Last filled 04-30-2021 30 [facility] ?Ozempic 0.5 mg- Patient assistance  ? ?Malecca Hicks CMA ?Clinical Pharmacist Assistant ?[phone] ? ?

## 2021-06-17 ENCOUNTER — Ambulatory Visit: Payer: Medicare PPO | Admitting: Podiatry

## 2021-06-17 ENCOUNTER — Encounter: Payer: Self-pay | Admitting: Podiatry

## 2021-06-17 DIAGNOSIS — B351 Tinea unguium: Secondary | ICD-10-CM | POA: Diagnosis not present

## 2021-06-17 DIAGNOSIS — E1140 Type 2 diabetes mellitus with diabetic neuropathy, unspecified: Secondary | ICD-10-CM

## 2021-06-17 DIAGNOSIS — M79676 Pain in unspecified toe(s): Secondary | ICD-10-CM | POA: Diagnosis not present

## 2021-06-17 DIAGNOSIS — Z794 Long term (current) use of insulin: Secondary | ICD-10-CM

## 2021-06-17 DIAGNOSIS — L608 Other nail disorders: Secondary | ICD-10-CM

## 2021-06-17 DIAGNOSIS — N179 Acute kidney failure, unspecified: Secondary | ICD-10-CM

## 2021-06-17 NOTE — Progress Notes (Addendum)
This patient returns to my office for at risk foot care.  This patient requires this care by a professional since this patient will be at risk due to having diabetes type 2.  Patient has not been seen in over 15 months. This patient is unable to cut nails herself since the patient cannot reach her nails.These nails are painful walking and wearing shoes.  This patient has not been seen in over 8 months.  This patient presents for at risk foot care today. ? ?General Appearance  Alert, conversant and in no acute stress. ? ?Vascular  Dorsalis pedis and posterior tibial  pulses are weakly  palpable  bilaterally.  Capillary return is within normal limits  bilaterally. Temperature is within normal limits  bilaterally. ? ?Neurologic  Senn-Weinstein monofilament wire test diminished  bilaterally. Muscle power within normal limits bilaterally. ? ?Nails Thick disfigured discolored nails with subungual debris  from hallux to fifth toes bilaterally. No evidence of bacterial infection or drainage bilaterally.  Pincer nails  ? ?Orthopedic  No limitations of motion  feet .  No crepitus or effusions noted.  HAV  B/L.  Hammer toes  B/L. ? ?Skin  normotropic skin with no porokeratosis noted bilaterally.  No signs of infections or ulcers noted.    ? ?Onychomycosis  Pain in right toes  Pain in left toes ? ?Consent was obtained for treatment procedures.   Mechanical debridement of nails 1-5  bilaterally performed with a nail nipper.  Filed with dremel without incident.  ? ? ?Return office visit    4 months                  Told patient to return for periodic foot care and evaluation due to potential at risk complications. ? ? ?Gardiner Barefoot DPM  ?

## 2021-06-25 ENCOUNTER — Telehealth: Payer: Self-pay

## 2021-06-25 ENCOUNTER — Other Ambulatory Visit: Payer: Self-pay

## 2021-06-25 MED ORDER — ROSUVASTATIN CALCIUM 10 MG PO TABS
10.0000 mg | ORAL_TABLET | ORAL | 3 refills | Status: DC
Start: 2021-06-25 — End: 2023-01-17

## 2021-06-25 NOTE — Chronic Care Management (AMB) (Signed)
? ? ?Chronic Care Management ?Pharmacy Assistant  ? ?Name: Helen Richardson  MRN: [id number] DOB: [birth year] ? ?Reason for Encounter: Medication Review/ Medication coordination ? ?Recent office visits:  ?None ? ?Recent consult visits:  ?06-17-2021 [name], DPM (Podiatry). Mechanical debridement of nails 1-5  bilaterally performed with a nail nipper.  Filed with dremel without incident. Follow up in 4 months ? ?Hospital visits:  ?None in previous 6 months ? ?Medications: ?Outpatient Encounter Medications as of 06/25/2021  ?Medication Sig Note  ? Alcohol Swabs (ALCOHOL PREP) 70 % PADS  03/10/2013: Received from: External Pharmacy  ? aspirin 81 MG chewable tablet Chew 81 mg by mouth at bedtime.   ? Calcium Carbonate-Vitamin D (CALTRATE 600+D PO) Take 1 tablet by mouth daily.   ? carvedilol (COREG) 6.25 MG tablet Take 1 tablet (6.25 mg total) by mouth 2 (two) times daily.   ? diclofenac sodium (VOLTAREN) 1 % GEL APPLY TO AFFECTED AREA 3 TIMES DAILY AS NEEDED   ? furosemide (LASIX) 40 MG tablet TAKE 1 TABLET(40 MG) BY MOUTH DAILY   ? glucose blood test strip Test up to 4 times daily   ? insulin degludec (TRESIBA FLEXTOUCH) 200 UNIT/ML FlexTouch Pen Inject 40 Units into the skin daily. INJECT 36 UNITS UNDER THE SKIN DAILY.   ? Insulin Pen Needle 32G X 4 MM MISC Use with insulin Dx code e11.65   ? nystatin (NYSTATIN) powder Apply 1 application topically 3 (three) times daily.   ? polyethylene glycol powder (GLYCOLAX/MIRALAX) 17 GM/SCOOP powder    ? potassium chloride SA (KLOR-CON) 20 MEQ tablet Take 1 tablet (20 mEq total) by mouth once for 1 dose. Please d/c 2 rx for 2 times per day thanks   ? rosuvastatin (CRESTOR) 10 MG tablet Take 1 tablet (10 mg total) by mouth See admin instructions. MONDAY-FRIDAY. DO NOT TAKE ON SATURDAY OR SUNDAY   ? Semaglutide,0.25 or 0.5MG /DOS, (OZEMPIC, 0.25 OR 0.5 MG/DOSE,) 2 MG/1.5ML SOPN Inject 0.25 mg into the skin once a week.   ? telmisartan (MICARDIS) 80 MG tablet TAKE 1 TABLET(80 MG)  BY MOUTH DAILY   ? TRADJENTA 5 MG TABS tablet Take 5 mg by mouth daily.   ? UNABLE TO FIND lancets 30 gauge   ? UNABLE TO FIND Comfort EZ Pen Needles 32 gauge x 5/32"   ? ?No facility-administered encounter medications on file as of 06/25/2021.  ? ?Reviewed chart for medication changes ahead of medication coordination call. ? ?No OVs or hospital visits since last care coordination call/Pharmacist visit. ? ?No medication changes indicated OR if recent visit, treatment plan here. ? ?BP Readings from Last 3 Encounters:  ?02/20/21 124/82  ?11/15/20 122/60  ?11/15/20 122/60  ?  ?Lab Results  ?Component Value Date  ? HGBA1C 9.2 (H) 02/20/2021  ?  ? ?Patient obtains medications through Adherence Packaging  30 Days  ? ?Last adherence delivery included:  ?Aspirin 81 mg- 1 tablet at bedtime ?Potassium 20 meq- 1 tablet daily (breakfast) ?Rosuvastatin 20 mg- 1 tablet daily Monday-Friday (bedtime) ?Furosemide 40 mg- 1 tablet daily (before breakfast) ?Telmisartan 80 mg - 1 tablet daily (before breakfast) ?Carvedilol 6.25 mg- 1 tablet twice daily (before breakfast and evening meal) ?Caltrate Bone Health (calcium) 600 mg + D3- 1 tablet daily (breakfast).  ? ?Patient declined (meds) last month: ?None ? ?Patient is due for next adherence delivery on: 07-05-2021 ? ?Called patient and reviewed medications and coordinated delivery. ? ?This delivery to include: ?Aspirin 81 mg- 1 tablet at bedtime ?Potassium  20 meq- 1 tablet daily (breakfast) ?Rosuvastatin 20 mg- 1 tablet daily Monday-Friday (bedtime) ?Furosemide 40 mg- 1 tablet daily (before breakfast) ?Telmisartan 80 mg - 1 tablet daily (before breakfast) ?Carvedilol 6.25 mg- 1 tablet twice daily (before breakfast and evening meal) ?Caltrate Bone Health (calcium) 600 mg + D3- 1 tablet daily (breakfast).  ? ?No acute/short fill needed ? ?Patient declined the following medications: ?None ? ?Patient needs refills for: refill request sent ?Rosuvastatin ? ?Confirmed delivery date of  07-05-2021 advised patient that pharmacy will contact them the morning of delivery. ? ? ?Malecca Hicks CMA ?Clinical Pharmacist Assistant ?[phone] ? ?

## 2021-06-26 ENCOUNTER — Ambulatory Visit: Payer: Medicare Other | Admitting: Internal Medicine

## 2021-06-28 ENCOUNTER — Telehealth: Payer: Self-pay

## 2021-06-28 NOTE — Telephone Encounter (Signed)
Referral faxed for remote health.  ?

## 2021-07-05 DIAGNOSIS — E1165 Type 2 diabetes mellitus with hyperglycemia: Secondary | ICD-10-CM | POA: Diagnosis not present

## 2021-07-13 ENCOUNTER — Emergency Department (HOSPITAL_COMMUNITY): Payer: Medicare PPO

## 2021-07-13 ENCOUNTER — Emergency Department (HOSPITAL_COMMUNITY)
Admission: EM | Admit: 2021-07-13 | Discharge: 2021-07-14 | Disposition: A | Discharge status: Home / Self Care | Payer: Medicare PPO | Attending: Emergency Medicine | Admitting: Emergency Medicine

## 2021-07-13 ENCOUNTER — Other Ambulatory Visit: Payer: Self-pay

## 2021-07-13 DIAGNOSIS — Z7982 Long term (current) use of aspirin: Secondary | ICD-10-CM | POA: Diagnosis not present

## 2021-07-13 DIAGNOSIS — E162 Hypoglycemia, unspecified: Secondary | ICD-10-CM | POA: Diagnosis not present

## 2021-07-13 DIAGNOSIS — E11649 Type 2 diabetes mellitus with hypoglycemia without coma: Secondary | ICD-10-CM | POA: Diagnosis not present

## 2021-07-13 DIAGNOSIS — R6889 Other general symptoms and signs: Secondary | ICD-10-CM | POA: Diagnosis not present

## 2021-07-13 DIAGNOSIS — R4182 Altered mental status, unspecified: Secondary | ICD-10-CM | POA: Diagnosis present

## 2021-07-13 DIAGNOSIS — I509 Heart failure, unspecified: Secondary | ICD-10-CM | POA: Diagnosis not present

## 2021-07-13 DIAGNOSIS — Z79899 Other long term (current) drug therapy: Secondary | ICD-10-CM | POA: Insufficient documentation

## 2021-07-13 DIAGNOSIS — R531 Weakness: Secondary | ICD-10-CM | POA: Diagnosis not present

## 2021-07-13 DIAGNOSIS — I110 Hypertensive heart disease with heart failure: Secondary | ICD-10-CM | POA: Insufficient documentation

## 2021-07-13 DIAGNOSIS — Z794 Long term (current) use of insulin: Secondary | ICD-10-CM | POA: Insufficient documentation

## 2021-07-13 DIAGNOSIS — E161 Other hypoglycemia: Secondary | ICD-10-CM | POA: Diagnosis not present

## 2021-07-13 DIAGNOSIS — Z743 Need for continuous supervision: Secondary | ICD-10-CM | POA: Diagnosis not present

## 2021-07-13 DIAGNOSIS — I10 Essential (primary) hypertension: Secondary | ICD-10-CM | POA: Diagnosis not present

## 2021-07-13 LAB — CBC
HCT: 37.9 % (ref 36.0–46.0)
Hemoglobin: 12.3 g/dL (ref 12.0–15.0)
MCH: 29.4 pg (ref 26.0–34.0)
MCHC: 32.5 g/dL (ref 30.0–36.0)
MCV: 90.7 fL (ref 80.0–100.0)
Platelets: 293 10*3/uL (ref 150–400)
RBC: 4.18 MIL/uL (ref 3.87–5.11)
RDW: 13.8 % (ref 11.5–15.5)
WBC: 6.9 10*3/uL (ref 4.0–10.5)
nRBC: 0 % (ref 0.0–0.2)

## 2021-07-13 LAB — URINALYSIS, ROUTINE W REFLEX MICROSCOPIC
Bilirubin Urine: NEGATIVE
Glucose, UA: NEGATIVE mg/dL
Hgb urine dipstick: NEGATIVE
Ketones, ur: NEGATIVE mg/dL
Leukocytes,Ua: NEGATIVE
Nitrite: NEGATIVE
Protein, ur: NEGATIVE mg/dL
Specific Gravity, Urine: 1.004 — ABNORMAL LOW (ref 1.005–1.030)
pH: 7 (ref 5.0–8.0)

## 2021-07-13 LAB — CBG MONITORING, ED
Glucose-Capillary: 91 mg/dL (ref 70–99)
Glucose-Capillary: 82 mg/dL (ref 70–99)
Glucose-Capillary: 135 mg/dL — ABNORMAL HIGH (ref 70–99)
Glucose-Capillary: 145 mg/dL — ABNORMAL HIGH (ref 70–99)
Glucose-Capillary: 109 mg/dL — ABNORMAL HIGH (ref 70–99)

## 2021-07-13 LAB — COMPREHENSIVE METABOLIC PANEL
ALT: 12 U/L (ref 0–44)
AST: 17 U/L (ref 15–41)
Albumin: 3.4 g/dL — ABNORMAL LOW (ref 3.5–5.0)
Alkaline Phosphatase: 59 U/L (ref 38–126)
Anion gap: 6 (ref 5–15)
BUN: 12 mg/dL (ref 8–23)
CO2: 27 mmol/L (ref 22–32)
Calcium: 8.9 mg/dL (ref 8.9–10.3)
Chloride: 106 mmol/L (ref 98–111)
Creatinine, Ser: 0.72 mg/dL (ref 0.44–1.00)
GFR, Estimated: >60 mL/min (ref >60)
Glucose, Bld: 118 mg/dL — ABNORMAL HIGH (ref 70–99)
Potassium: 4.6 mmol/L (ref 3.5–5.1)
Sodium: 139 mmol/L (ref 135–145)
Total Bilirubin: 0.5 mg/dL (ref 0.3–1.2)
Total Protein: 6.8 g/dL (ref 6.5–8.1)

## 2021-07-13 LAB — TROPONIN I (HIGH SENSITIVITY): Troponin I (High Sensitivity): 6 ng/L (ref <18)

## 2021-07-13 MED ORDER — ROSUVASTATIN CALCIUM 20 MG PO TABS: 10.0000 mg | ORAL_TABLET | ORAL | Status: DC

## 2021-07-13 MED ORDER — SODIUM CHLORIDE 0.9% FLUSH
3.0000 mL | INTRAVENOUS | Status: DC | PRN
  Administered 2021-07-13: 3 mL via INTRAVENOUS

## 2021-07-13 MED ORDER — DICLOFENAC SODIUM 1 % TD GEL: 2.0000 g | Freq: Three times a day (TID) | TRANSDERMAL | Status: DC | PRN

## 2021-07-13 MED ORDER — FUROSEMIDE 40 MG PO TABS
40.0000 mg | ORAL_TABLET | Freq: Every day | ORAL | Status: DC
  Administered 2021-07-14: 40 mg via ORAL
  Filled 2021-07-13: qty 1

## 2021-07-13 MED ORDER — SODIUM CHLORIDE 0.9 % IV SOLN: 250.0000 mL | INTRAVENOUS | Status: DC | PRN

## 2021-07-13 MED ORDER — CARVEDILOL 3.125 MG PO TABS
6.2500 mg | ORAL_TABLET | Freq: Two times a day (BID) | ORAL | Status: DC
  Administered 2021-07-13 – 2021-07-14 (×2): 6.25 mg via ORAL
  Filled 2021-07-13 (×2): qty 2

## 2021-07-13 MED ORDER — IRBESARTAN 300 MG PO TABS
300.0000 mg | ORAL_TABLET | Freq: Every day | ORAL | Status: DC
  Administered 2021-07-14: 300 mg via ORAL
  Filled 2021-07-13: qty 1

## 2021-07-13 MED ORDER — ASPIRIN 81 MG PO CHEW
81.0000 mg | CHEWABLE_TABLET | Freq: Every day | ORAL | Status: DC
  Administered 2021-07-13: 81 mg via ORAL
  Filled 2021-07-13: qty 1

## 2021-07-13 MED ORDER — SODIUM CHLORIDE 0.9% FLUSH
3.0000 mL | Freq: Two times a day (BID) | INTRAVENOUS | Status: DC
  Administered 2021-07-13 – 2021-07-14 (×2): 3 mL via INTRAVENOUS

## 2021-07-13 MED ORDER — INSULIN ASPART 100 UNIT/ML IJ SOLN
0.0000 [IU] | Freq: Three times a day (TID) | INTRAMUSCULAR | Status: DC
  Filled 2021-07-13: qty 0.2

## 2021-07-13 MED ORDER — DICLOFENAC SODIUM 1 % EX GEL
2.0000 g | Freq: Three times a day (TID) | CUTANEOUS | Status: DC | PRN
Start: 2021-07-13 — End: 2021-07-14
  Filled 2021-07-13: qty 100

## 2021-07-13 NOTE — ED Provider Notes (Signed)
?[facility]-EMERGENCY DEPT ?Provider Note ? ? ?CSN: [id number] ?Arrival date & time: 07/13/21  1528 ? ?  ? ?History ? ?Chief Complaint  ?Patient presents with  ? Hypoglycemia  ? ? ?Helen Richardson is a 88 y.o. female. ? ?Pt is a 88 yo female with a pmhx significant for dm, htn, high cholesterol, and chf.  She lives alone, but her daughter-in-law stays over frequently.  Pt was found on the toilet by the daughter-in-law altered and clammy.  Pt given apple juice by the D-I-L and EMS was called.  EMS said bs 42, so they gave her 10 g D10.  Pt is now awake and alert.  She feels better, but is hungry.  She said her D-I-L makes her meals and has not fed her since last night at dinner.  ? ? ?  ? ?Home Medications ?Prior to Admission medications   ?Medication Sig Start Date End Date Taking? Authorizing Provider  ?aspirin 81 MG chewable tablet Chew 81 mg by mouth at bedtime.   Yes [provider]  ?Calcium Carbonate-Vitamin D (CALTRATE 600+D PO) Take 1 tablet by mouth daily.   Yes [provider]  ?carvedilol (COREG) 6.25 MG tablet Take 1 tablet (6.25 mg total) by mouth 2 (two) times daily. 01/22/21  Yes Dorothyann Peng, MD  ?furosemide (LASIX) 40 MG tablet TAKE 1 TABLET(40 MG) BY MOUTH DAILY ?Patient taking differently: Take 40 mg by mouth daily. 01/22/21  Yes Dorothyann Peng, MD  ?insulin degludec (TRESIBA FLEXTOUCH) 200 UNIT/ML FlexTouch Pen Inject 40 Units into the skin daily. INJECT 36 UNITS UNDER THE SKIN DAILY. ?Patient taking differently: Inject 40 Units into the skin daily. 03/27/21  Yes Dorothyann Peng, MD  ?potassium chloride SA (KLOR-CON) 20 MEQ tablet Take 1 tablet (20 mEq total) by mouth once for 1 dose. Please d/c 2 rx for 2 times per day thanks ?Patient taking differently: Take 20 mEq by mouth daily. 04/12/20 07/14/22 Yes [name], NP  ?rosuvastatin (CRESTOR) 10 MG tablet Take 1 tablet (10 mg total) by mouth See admin instructions. MONDAY-FRIDAY. DO NOT TAKE ON SATURDAY  OR SUNDAY 06/25/21  Yes Dorothyann Peng, MD  ?Semaglutide,0.25 or 0.5MG /DOS, (OZEMPIC, 0.25 OR 0.5 MG/DOSE,) 2 MG/1.5ML SOPN Inject 0.25 mg into the skin once a week. 11/23/20  Yes Dorothyann Peng, MD  ?telmisartan (MICARDIS) 80 MG tablet TAKE 1 TABLET(80 MG) BY MOUTH DAILY ?Patient taking differently: Take 80 mg by mouth daily. 01/22/21  Yes Dorothyann Peng, MD  ?Alcohol Swabs (ALCOHOL PREP) 70 % PADS  02/09/13   [provider]  ?diclofenac sodium (VOLTAREN) 1 % GEL APPLY TO AFFECTED AREA 3 TIMES DAILY AS NEEDED ?Patient taking differently: Apply 2 g topically 3 (three) times daily as needed (for pain). 06/16/18   Dorothyann Peng, MD  ?glucose blood test strip Test up to 4 times daily 09/17/18   Dorothyann Peng, MD  ?Insulin Pen Needle 32G X 4 MM MISC Use with insulin Dx code e11.65 12/07/19   Dorothyann Peng, MD  ?Ardelia Mems TO FIND lancets 30 gauge    [provider]  ?UNABLE TO FIND Comfort EZ Pen Needles 32 gauge x 5/32"    [provider]  ?   ? ?Allergies    ?Tradjenta [linagliptin]   ? ?Review of Systems   ?Review of Systems  ?Neurological:  Positive for weakness.  ?All other systems reviewed and are negative. ? ?Physical Exam ?Updated Vital Signs ?BP (!) 150/77   Pulse (!) 107   Temp 97.6 ?  F (36.4 ?C) (Oral)   Resp 15   SpO2 95%  ?Physical Exam ?Vitals and nursing note reviewed.  ?Constitutional:   ?   Appearance: Normal appearance.  ?HENT:  ?   Head: Normocephalic and atraumatic.  ?   Right Ear: External ear normal.  ?   Left Ear: External ear normal.  ?   Nose: Nose normal.  ?   Mouth/Throat:  ?   Mouth: Mucous membranes are moist.  ?   Pharynx: Oropharynx is clear.  ?Eyes:  ?   Extraocular Movements: Extraocular movements intact.  ?   Conjunctiva/sclera: Conjunctivae normal.  ?   Pupils: Pupils are equal, round, and reactive to light.  ?Cardiovascular:  ?   Rate and Rhythm: Normal rate and regular rhythm.  ?   Pulses: Normal pulses.  ?   Heart sounds: Normal heart sounds.  ?Pulmonary:  ?    Effort: Pulmonary effort is normal.  ?   Breath sounds: Normal breath sounds.  ?Abdominal:  ?   General: Abdomen is flat. Bowel sounds are normal.  ?   Palpations: Abdomen is soft.  ?Musculoskeletal:     ?   General: Normal range of motion.  ?   Cervical back: Normal range of motion and neck supple.  ?Skin: ?   General: Skin is warm.  ?   Capillary Refill: Capillary refill takes less than 2 seconds.  ?Neurological:  ?   General: No focal deficit present.  ?   Mental Status: She is alert and oriented to person, place, and time.  ?Psychiatric:     ?   Mood and Affect: Mood normal.     ?   Behavior: Behavior normal.  ? ? ?ED Results / Procedures / Treatments   ?Labs ?(all labs ordered are listed, but only abnormal results are displayed) ?Labs Reviewed  ?URINALYSIS, ROUTINE W REFLEX MICROSCOPIC - Abnormal; Notable for the following components:  ?    Result Value  ? Color, Urine STRAW (*)   ? Specific Gravity, Urine 1.004 (*)   ? All other components within normal limits  ?COMPREHENSIVE METABOLIC PANEL - Abnormal; Notable for the following components:  ? Glucose, Bld 118 (*)   ? Albumin 3.4 (*)   ? All other components within normal limits  ?CBG MONITORING, ED - Abnormal; Notable for the following components:  ? Glucose-Capillary 135 (*)   ? All other components within normal limits  ?CBG MONITORING, ED - Abnormal; Notable for the following components:  ? Glucose-Capillary 145 (*)   ? All other components within normal limits  ?CBC  ?CBG MONITORING, ED  ?CBG MONITORING, ED  ?CBG MONITORING, ED  ?TROPONIN I (HIGH SENSITIVITY)  ? ? ?EKG ?EKG Interpretation ? ?Date/Time:  Saturday Jul 13 2021 17:15:45 EDT ?Ventricular Rate:  74 ?PR Interval:  173 ?QRS Duration: 85 ?QT Interval:  395 ?QTC Calculation: 439 ?R Axis:   -41 ?Text Interpretation: Sinus rhythm Atrial premature complex Left axis deviation Low voltage, precordial leads No significant change since last tracing Confirmed by Jacalyn Lefevre [phone]) on 07/13/2021  5:49:20 PM ? ?Radiology ?DG Chest Port 1 View ? ?Result Date: 07/13/2021 ?CLINICAL DATA:  weakness EXAM: PORTABLE CHEST 1 VIEW COMPARISON:  Chest radiograph dated May 20, 2020 FINDINGS: The cardiomediastinal silhouette is unchanged in contour.Atherosclerotic calcifications. No pleural effusion. No pneumothorax. No acute pleuroparenchymal abnormality. Visualized abdomen is unremarkable. IMPRESSION: No acute cardiopulmonary abnormality. Electronically Signed   By: Meda Klinefelter M.D.   On: 07/13/2021 16:19   ? ?  Procedures ?Procedures  ? ? ?Medications Ordered in ED ?Medications  ?sodium chloride flush (NS) 0.9 % injection 3 mL (3 mLs Intravenous Given 07/13/21 2135)  ?sodium chloride flush (NS) 0.9 % injection 3 mL (3 mLs Intravenous Given 07/13/21 2125)  ?0.9 %  sodium chloride infusion (has no administration in time range)  ?aspirin chewable tablet 81 mg (has no administration in time range)  ?carvedilol (COREG) tablet 6.25 mg (has no administration in time range)  ?diclofenac sodium (VOLTAREN) 1 % transdermal gel 2 g (has no administration in time range)  ?furosemide (LASIX) tablet 40 mg (has no administration in time range)  ?irbesartan (AVAPRO) tablet 300 mg (has no administration in time range)  ?insulin aspart (novoLOG) injection 0-20 Units (has no administration in time range)  ?rosuvastatin (CRESTOR) tablet 10 mg (has no administration in time range)  ? ? ?ED Course/ Medical Decision Making/ A&P ?  ?                        ?Medical Decision Making ?Amount and/or Complexity of Data Reviewed ?Labs: ordered. ?Radiology: ordered. ? ?Risk ?OTC drugs. ?Prescription drug management. ? ? ?This patient presents to the ED for concern of hypoglycemia, this involves an extensive number of treatment options, and is a complaint that carries with it a high risk of complications and morbidity.  The differential diagnosis includes lack of food, medication error, electrolyte abn ? ? ?Co morbidities that complicate the  patient evaluation ? ?dm, htn, high cholesterol, and chf ? ? ?Additional history obtained: ? ?Additional history obtained from epic chart review ?External records from outside source obtained and reviewed includi

## 2021-07-13 NOTE — ED Triage Notes (Addendum)
Pt BIBA from home. Pt found on toilet, altered, and clammy by daughter-in-law. Pt given apple juice. Upon EMS arrival CBG was 42. Given 10 gm D10. Pt returned to baseline. ? ?Pt states they have not been fed since last night. ? ?CBG: 102 after D10 ?BP: 178/112 ?HR: 78 ?SPO2: 95 ?

## 2021-07-13 NOTE — ED Notes (Signed)
Patient place on 2L [place] due to oxygen saturation being low. Patient is pleasant and slightly confused about why she is here.  ?

## 2021-07-13 NOTE — ED Notes (Signed)
Plz call  [phone] when pt get discharge.  ?

## 2021-07-14 LAB — CBG MONITORING, ED
Glucose-Capillary: 235 mg/dL — ABNORMAL HIGH (ref 70–99)
Glucose-Capillary: 182 mg/dL — ABNORMAL HIGH (ref 70–99)
Glucose-Capillary: 95 mg/dL (ref 70–99)

## 2021-07-14 NOTE — ED Notes (Signed)
Pt. CBG 182, RN made aware. ?

## 2021-07-14 NOTE — Progress Notes (Addendum)
CSW attempted to reach patient's son Helen Richardson - a voicemail was left requesting a return call. ? ?CSW attempted to reach patient's friend Helen Richardson without success - a voicemail was left requesting a return call.  ? ?CSW spoke with patient's sister in law Helen Richardson who states she lives in [place] and sees the patient frequently but is not involved in her daily care activities. Helen Richardson states patient's daughter in law Helen Richardson is her main caregiver. ? ?CSW spoke with patient's son Helen Richardson who states patient does live alone and confirms that she is safe. Helen Richardson who states his ex wife Helen Richardson participates in the patient's care including meals, bathing, dressing, etc. Helen Richardson is in agreement to transport the patient home when she is medically cleared. ? ?CSW informed RN and MD of information. ? ?Madilyn Fireman, MSW, LCSW ?Transitions of Care  Clinical Social Worker II ?[phone] ? ?

## 2021-07-14 NOTE — ED Notes (Signed)
Patient is asleep.  

## 2021-07-14 NOTE — ED Notes (Signed)
Patient is awake watching TV ?

## 2021-07-14 NOTE — ED Provider Notes (Signed)
Patient was seen by social work.  They indicated the patient has a lot of family support at home.  Family is comfortable with the patient being discharged ?  ?Linwood Dibbles, MD ?07/14/21 [phone] ? ?

## 2021-07-14 NOTE — Discharge Instructions (Signed)
Monitor your blood sugar closely.  Make sure to eat your meals regularly.  Follow-up with your doctor next week to be rechecked ?

## 2021-07-14 NOTE — ED Notes (Signed)
Patient as incontinent to urine. Patient was cleaned and repositioned in bed.  ?

## 2021-07-16 ENCOUNTER — Telehealth: Payer: Self-pay

## 2021-07-16 NOTE — Telephone Encounter (Signed)
Chmg-error.  

## 2021-07-16 NOTE — Telephone Encounter (Signed)
Transition Care Management Unsuccessful Follow-up Telephone Call ? ?Date of discharge and from where:  07/14/2021 ? ?Attempts:  1st Attempt ? ?Reason for unsuccessful TCM follow-up call:  No answer/busy ? ?  ?

## 2021-07-19 ENCOUNTER — Telehealth: Payer: Self-pay

## 2021-07-19 NOTE — Telephone Encounter (Signed)
Transition Care Management Unsuccessful Follow-up Telephone Call  Date of discharge and from where:  07/15/2021  hospital  Attempts:  2nd Attempt  Reason for unsuccessful TCM follow-up call:  Unable to reach patient

## 2021-07-22 NOTE — Telephone Encounter (Signed)
error 

## 2021-07-24 ENCOUNTER — Other Ambulatory Visit: Payer: Self-pay

## 2021-07-24 ENCOUNTER — Telehealth: Payer: Self-pay

## 2021-07-24 MED ORDER — CARVEDILOL 6.25 MG PO TABS: 6.2500 mg | ORAL_TABLET | Freq: Two times a day (BID) | ORAL | 0 refills | Status: DC

## 2021-07-24 MED ORDER — TELMISARTAN 80 MG PO TABS: ORAL_TABLET | ORAL | 0 refills | Status: DC

## 2021-07-24 MED ORDER — FUROSEMIDE 40 MG PO TABS: ORAL_TABLET | ORAL | 0 refills | Status: DC

## 2021-07-24 NOTE — Chronic Care Management (AMB) (Cosign Needed)
Chronic Care Management Pharmacy Assistant   Name: BRUNA BUSH  MRN: [id number] DOB: 1933/11/23  Reason for Encounter: Medication Review/ Medication coordination  Recent office visits:  None  Recent consult visits:  None  Hospital visits:  Medication Reconciliation was completed by comparing discharge summary, patient's EMR and Pharmacy list, and upon discussion with patient.  Admitted to the hospital on 07-13-2021 due to Hypoglycemia. Discharge date was 07-14-2021. Discharged from [facility]?Medications Started at [facility] Discharge:?? None  Medication Changes at Hospital Discharge: None  Medications Discontinued at Hospital Discharge: None  Medications that remain the same after Hospital Discharge:??  -All other medications will remain the same.    Medications: Outpatient Encounter Medications as of 07/24/2021  Medication Sig   Alcohol Swabs (ALCOHOL PREP) 70 % PADS    aspirin 81 MG chewable tablet Chew 81 mg by mouth at bedtime.   Calcium Carbonate-Vitamin D (CALTRATE 600+D PO) Take 1 tablet by mouth daily.   carvedilol (COREG) 6.25 MG tablet Take 1 tablet (6.25 mg total) by mouth 2 (two) times daily.   diclofenac sodium (VOLTAREN) 1 % GEL APPLY TO AFFECTED AREA 3 TIMES DAILY AS NEEDED (Patient taking differently: Apply 2 g topically 3 (three) times daily as needed (for pain).)   furosemide (LASIX) 40 MG tablet TAKE 1 TABLET(40 MG) BY MOUTH DAILY (Patient taking differently: Take 40 mg by mouth daily.)   glucose blood test strip Test up to 4 times daily   insulin degludec (TRESIBA FLEXTOUCH) 200 UNIT/ML FlexTouch Pen Inject 40 Units into the skin daily. INJECT 36 UNITS UNDER THE SKIN DAILY. (Patient taking differently: Inject 40 Units into the skin daily.)   Insulin Pen Needle 32G X 4 MM MISC Use with insulin Dx code e11.65   potassium chloride SA (KLOR-CON) 20 MEQ tablet Take 1 tablet (20 mEq total) by mouth once for 1 dose. Please d/c 2 rx for 2  times per day thanks (Patient taking differently: Take 20 mEq by mouth daily.)   rosuvastatin (CRESTOR) 10 MG tablet Take 1 tablet (10 mg total) by mouth See admin instructions. MONDAY-FRIDAY. DO NOT TAKE ON SATURDAY OR SUNDAY   Semaglutide,0.25 or 0.5MG /DOS, (OZEMPIC, 0.25 OR 0.5 MG/DOSE,) 2 MG/1.5ML SOPN Inject 0.25 mg into the skin once a week.   telmisartan (MICARDIS) 80 MG tablet TAKE 1 TABLET(80 MG) BY MOUTH DAILY (Patient taking differently: Take 80 mg by mouth daily.)   UNABLE TO FIND lancets 30 gauge   UNABLE TO FIND Comfort EZ Pen Needles 32 gauge x 5/32"   No facility-administered encounter medications on file as of 07/24/2021.   Reviewed chart for medication changes ahead of medication coordination call.  No medication changes indicated OR if recent visit, treatment plan here.  BP Readings from Last 3 Encounters:  07/14/21 140/73  02/20/21 124/82  11/15/20 122/60    Lab Results  Component Value Date   HGBA1C 9.2 (H) 02/20/2021     Patient obtains medications through Adherence Packaging  30 Days   Last adherence delivery included:  Aspirin 81 mg- 1 tablet at bedtime Potassium 20 meq- 1 tablet daily (breakfast) Rosuvastatin 20 mg- 1 tablet daily Monday-Friday (bedtime) Furosemide 40 mg- 1 tablet daily (before breakfast) Telmisartan 80 mg - 1 tablet daily (before breakfast) Carvedilol 6.25 mg- 1 tablet twice daily (before breakfast and evening meal) Caltrate Bone Health (calcium) 600 mg + D3- 1 tablet daily (breakfast).   Patient declined (meds) last month:  None  Patient is  due for next adherence delivery on: 08-06-2021  Called patient and reviewed medications and coordinated delivery.  This delivery to include: Aspirin 81 mg- 1 tablet at bedtime Potassium 20 meq- 1 tablet daily (breakfast) Rosuvastatin 20 mg- 1 tablet daily Monday-Friday (bedtime) Furosemide 40 mg- 1 tablet daily (before breakfast) Telmisartan 80 mg - 1 tablet daily (before  breakfast) Carvedilol 6.25 mg- 1 tablet twice daily (before breakfast and evening meal) Caltrate Bone Health (calcium) 600 mg + D3- 1 tablet daily (breakfast).  Tresiba 40 units daily  No acute/short fill needed  Patient declined the following medications: None  Patient needs refills for: request sent Potassium Furosemide  Carvedilol Telmisartan   Confirmed delivery date of 08-06-2021 advised patient that pharmacy will contact them the morning of delivery.   [place] Pharmacist Assistant [phone]

## 2021-07-30 DIAGNOSIS — Z794 Long term (current) use of insulin: Secondary | ICD-10-CM | POA: Diagnosis not present

## 2021-07-30 DIAGNOSIS — E119 Type 2 diabetes mellitus without complications: Secondary | ICD-10-CM | POA: Diagnosis not present

## 2021-08-05 DIAGNOSIS — E119 Type 2 diabetes mellitus without complications: Secondary | ICD-10-CM | POA: Diagnosis not present

## 2021-08-05 DIAGNOSIS — E1165 Type 2 diabetes mellitus with hyperglycemia: Secondary | ICD-10-CM | POA: Diagnosis not present

## 2021-08-12 ENCOUNTER — Other Ambulatory Visit: Payer: Self-pay

## 2021-08-12 MED ORDER — OZEMPIC (0.25 OR 0.5 MG/DOSE) 2 MG/1.5ML ~~LOC~~ SOPN: 0.2500 mg | PEN_INJECTOR | SUBCUTANEOUS | 1 refills | Status: DC

## 2021-08-28 ENCOUNTER — Other Ambulatory Visit: Payer: Self-pay | Admitting: Internal Medicine

## 2021-08-31 ENCOUNTER — Encounter (HOSPITAL_COMMUNITY): Payer: Self-pay | Admitting: Emergency Medicine

## 2021-08-31 ENCOUNTER — Other Ambulatory Visit: Payer: Self-pay

## 2021-08-31 ENCOUNTER — Emergency Department (HOSPITAL_COMMUNITY)
Admission: EM | Admit: 2021-08-31 | Discharge: 2021-08-31 | Disposition: A | Discharge status: Home / Self Care | Payer: Medicare PPO | Attending: Emergency Medicine | Admitting: Emergency Medicine

## 2021-08-31 DIAGNOSIS — E161 Other hypoglycemia: Secondary | ICD-10-CM | POA: Diagnosis not present

## 2021-08-31 DIAGNOSIS — E162 Hypoglycemia, unspecified: Secondary | ICD-10-CM | POA: Diagnosis not present

## 2021-08-31 DIAGNOSIS — B9689 Other specified bacterial agents as the cause of diseases classified elsewhere: Secondary | ICD-10-CM | POA: Diagnosis not present

## 2021-08-31 DIAGNOSIS — N390 Urinary tract infection, site not specified: Secondary | ICD-10-CM | POA: Insufficient documentation

## 2021-08-31 DIAGNOSIS — R4182 Altered mental status, unspecified: Secondary | ICD-10-CM | POA: Diagnosis not present

## 2021-08-31 DIAGNOSIS — Z7982 Long term (current) use of aspirin: Secondary | ICD-10-CM | POA: Insufficient documentation

## 2021-08-31 DIAGNOSIS — Z794 Long term (current) use of insulin: Secondary | ICD-10-CM | POA: Diagnosis not present

## 2021-08-31 DIAGNOSIS — Z79899 Other long term (current) drug therapy: Secondary | ICD-10-CM | POA: Insufficient documentation

## 2021-08-31 DIAGNOSIS — R0902 Hypoxemia: Secondary | ICD-10-CM | POA: Diagnosis not present

## 2021-08-31 DIAGNOSIS — I10 Essential (primary) hypertension: Secondary | ICD-10-CM | POA: Insufficient documentation

## 2021-08-31 DIAGNOSIS — E11649 Type 2 diabetes mellitus with hypoglycemia without coma: Secondary | ICD-10-CM | POA: Diagnosis not present

## 2021-08-31 LAB — CBC WITH DIFFERENTIAL/PLATELET
Abs Immature Granulocytes: 0.02 10*3/uL (ref 0.00–0.07)
Basophils Absolute: 0.1 10*3/uL (ref 0.0–0.1)
Basophils Relative: 1 %
Eosinophils Absolute: 0.4 10*3/uL (ref 0.0–0.5)
Eosinophils Relative: 4 %
HCT: 33.9 % — ABNORMAL LOW (ref 36.0–46.0)
Hemoglobin: 10.6 g/dL — ABNORMAL LOW (ref 12.0–15.0)
Immature Granulocytes: 0 %
Lymphocytes Relative: 16 %
Lymphs Abs: 1.4 10*3/uL (ref 0.7–4.0)
MCH: 27.9 pg (ref 26.0–34.0)
MCHC: 31.3 g/dL (ref 30.0–36.0)
MCV: 89.2 fL (ref 80.0–100.0)
Monocytes Absolute: 0.7 10*3/uL (ref 0.1–1.0)
Monocytes Relative: 8 %
Neutro Abs: 6 10*3/uL (ref 1.7–7.7)
Neutrophils Relative %: 71 %
Platelets: 277 10*3/uL (ref 150–400)
RBC: 3.8 MIL/uL — ABNORMAL LOW (ref 3.87–5.11)
RDW: 14.1 % (ref 11.5–15.5)
WBC: 8.4 10*3/uL (ref 4.0–10.5)
nRBC: 0 % (ref 0.0–0.2)

## 2021-08-31 LAB — URINALYSIS, ROUTINE W REFLEX MICROSCOPIC
Bilirubin Urine: NEGATIVE
Glucose, UA: NEGATIVE mg/dL
Ketones, ur: NEGATIVE mg/dL
Nitrite: NEGATIVE
Protein, ur: NEGATIVE mg/dL
Specific Gravity, Urine: 1.008 (ref 1.005–1.030)
pH: 6 (ref 5.0–8.0)

## 2021-08-31 LAB — BASIC METABOLIC PANEL
Anion gap: 5 (ref 5–15)
BUN: 18 mg/dL (ref 8–23)
CO2: 29 mmol/L (ref 22–32)
Calcium: 8.6 mg/dL — ABNORMAL LOW (ref 8.9–10.3)
Chloride: 104 mmol/L (ref 98–111)
Creatinine, Ser: 0.96 mg/dL (ref 0.44–1.00)
GFR, Estimated: 58 mL/min — ABNORMAL LOW (ref >60)
Glucose, Bld: 195 mg/dL — ABNORMAL HIGH (ref 70–99)
Potassium: 4 mmol/L (ref 3.5–5.1)
Sodium: 138 mmol/L (ref 135–145)

## 2021-08-31 LAB — CBG MONITORING, ED
Glucose-Capillary: 197 mg/dL — ABNORMAL HIGH (ref 70–99)
Glucose-Capillary: 94 mg/dL (ref 70–99)

## 2021-08-31 MED ORDER — CEPHALEXIN 250 MG PO CAPS: 250.0000 mg | ORAL_CAPSULE | Freq: Four times a day (QID) | ORAL | 0 refills | Status: DC

## 2021-08-31 MED ORDER — CEPHALEXIN 250 MG PO CAPS
250.0000 mg | ORAL_CAPSULE | Freq: Once | ORAL | Status: AC
  Administered 2021-08-31: 250 mg via ORAL
  Filled 2021-08-31: qty 1

## 2021-08-31 NOTE — ED Notes (Signed)
I provided reinforced discharge education based off of discharge instructions. Pt acknowledged and understood my education. Pt had no further questions/concerns for provider/myself.  °

## 2021-08-31 NOTE — ED Triage Notes (Signed)
Pt tp ER via EMS from home.  Pt awoke as normal this AM and had CBG in 50s.  Family reports this is normal for AM CBG.  Pt ate breakfast and took normal medications.  Family noted the pt LOC began to decline and called EMS.  PT fire arrived pt CBG was 29.  EMS gave 1mg  glucagen and then established IV and gave D10.  Repeat for EMS was 114.  PT arrives alert, oriented, warm and dry.

## 2021-08-31 NOTE — Discharge Instructions (Signed)
Try to eat 3 meals a day.  We are treating you for UTI and have sent a prescription to your pharmacy.  Pick it up and start taking it, tomorrow morning.  Follow-up with your doctor in 1 week for checkup and repeat urine testing.  Return here if needed for problems

## 2021-08-31 NOTE — ED Notes (Signed)
Called pt's daughter, who will be working on coming up to pick the pt up. Provided call back number for any updates.

## 2021-08-31 NOTE — ED Provider Notes (Signed)
[facility]-EMERGENCY DEPT Provider Note   CSN: [id number] Arrival date & time: 08/31/21  1413     History {Add pertinent medical, surgical, social history, OB history to HPI:1} Chief Complaint  Patient presents with   Hypoglycemia    Helen Richardson is a 88 y.o. female.  HPI Patient had low CBG this morning, later EMS arrived, CBG lower at 29.  She was treated with glucagon with improvement.  Previously seen here for similar problems on 07/15/2021.  Patient states she ate breakfast this morning and took her usual medications.  She denies recent illness.  She is a somewhat poor historian.  Apparently family members fix her fluid for her.  They are not currently here.    Home Medications Prior to Admission medications   Medication Sig Start Date End Date Taking? Authorizing Provider  Alcohol Swabs (ALCOHOL PREP) 70 % PADS  02/09/13   [provider]  aspirin 81 MG chewable tablet Chew 81 mg by mouth at bedtime.    [provider]  Calcium Carbonate-Vitamin D (CALTRATE 600+D PO) Take 1 tablet by mouth daily.    [provider]  carvedilol (COREG) 6.25 MG tablet Take 1 tablet (6.25 mg total) by mouth 2 (two) times daily. 07/24/21   Dorothyann Peng, MD  diclofenac sodium (VOLTAREN) 1 % GEL APPLY TO AFFECTED AREA 3 TIMES DAILY AS NEEDED Patient taking differently: Apply 2 g topically 3 (three) times daily as needed (for pain). 06/16/18   Dorothyann Peng, MD  furosemide (LASIX) 40 MG tablet TAKE 1 TABLET(40 MG) BY MOUTH DAILY 07/24/21   Dorothyann Peng, MD  glucose blood test strip Test up to 4 times daily 09/17/18   Dorothyann Peng, MD  insulin degludec (TRESIBA FLEXTOUCH) 200 UNIT/ML FlexTouch Pen Inject 40 Units into the skin daily. INJECT 36 UNITS UNDER THE SKIN DAILY. Patient taking differently: Inject 40 Units into the skin daily. 03/27/21   Dorothyann Peng, MD  Insulin Pen Needle 32G X 4 MM MISC Use with insulin Dx code e11.65 12/07/19   Dorothyann Peng, MD  potassium chloride SA (KLOR-CON) 20 MEQ tablet Take 1 tablet (20 mEq total) by mouth once for 1 dose. Please d/c 2 rx for 2 times per day thanks Patient taking differently: Take 20 mEq by mouth daily. 04/12/20 07/14/22  [name], NP  rosuvastatin (CRESTOR) 10 MG tablet Take 1 tablet (10 mg total) by mouth See admin instructions. MONDAY-FRIDAY. DO NOT TAKE ON SATURDAY OR SUNDAY 06/25/21   Dorothyann Peng, MD  Semaglutide,0.25 or 0.5MG /DOS, (OZEMPIC, 0.25 OR 0.5 MG/DOSE,) 2 MG/1.5ML SOPN Inject 0.25 mg into the skin once a week. 08/12/21   Dorothyann Peng, MD  telmisartan (MICARDIS) 80 MG tablet TAKE 1 TABLET(80 MG) BY MOUTH DAILY 07/24/21   Dorothyann Peng, MD  UNABLE TO FIND lancets 30 gauge    [provider]  UNABLE TO FIND Comfort EZ Pen Needles 32 gauge x 5/32"    [provider]      Allergies    Tradjenta [linagliptin]    Review of Systems   Review of Systems  Physical Exam Updated Vital Signs BP (!) 165/73   Pulse 77   Temp (!) 97.5 F (36.4 C) (Oral)   Resp 18   Ht 5' 2.5" (1.588 m)   Wt 84.8 kg   SpO2 100%   BMI 33.66 kg/m  Physical Exam Vitals and nursing note reviewed.  Constitutional:      General: She is not in acute distress.  Appearance: She is well-developed. She is obese. She is not ill-appearing, toxic-appearing or diaphoretic.  HENT:     Head: Normocephalic and atraumatic.     Right Ear: External ear normal.     Left Ear: External ear normal.  Eyes:     Conjunctiva/sclera: Conjunctivae normal.     Pupils: Pupils are equal, round, and reactive to light.  Neck:     Trachea: Phonation normal.  Cardiovascular:     Rate and Rhythm: Normal rate and regular rhythm.     Heart sounds: Normal heart sounds.  Pulmonary:     Effort: Pulmonary effort is normal.     Breath sounds: Normal breath sounds.  Abdominal:     General: There is no distension.     Palpations: Abdomen is soft.     Tenderness: There is no abdominal  tenderness.  Musculoskeletal:        General: Normal range of motion.     Cervical back: Normal range of motion and neck supple.  Skin:    General: Skin is warm and dry.  Neurological:     Mental Status: She is alert and oriented to person, place, and time.     Cranial Nerves: No cranial nerve deficit.     Sensory: No sensory deficit.     Motor: No abnormal muscle tone.     Coordination: Coordination normal.  Psychiatric:        Mood and Affect: Mood normal.        Behavior: Behavior normal.     ED Results / Procedures / Treatments   Labs (all labs ordered are listed, but only abnormal results are displayed) Labs Reviewed  BASIC METABOLIC PANEL  CBC WITH DIFFERENTIAL/PLATELET  URINALYSIS, ROUTINE W REFLEX MICROSCOPIC  CBG MONITORING, ED    EKG None  Radiology No results found.  Procedures Procedures  {Document cardiac monitor, telemetry assessment procedure when appropriate:1}  Medications Ordered in ED Medications - No data to display  ED Course/ Medical Decision Making/ A&P                           Medical Decision Making Patient presenting for symptomatic hypoglycemia with altered mental status.  Similar problems previously.  She overall feels well, and presents by EMS with fairly normal vital signs, albeit mild hypertension.  Patient is alert but confused.  Will evaluate and contact family members for help with history and disposition.  Amount and/or Complexity of Data Reviewed Labs: ordered.  Risk Prescription drug management.     {Document critical care time when appropriate:1} {Document review of labs and clinical decision tools ie heart score, Chads2Vasc2 etc:1}  {Document your independent review of radiology images, and any outside records:1} {Document your discussion with family members, caretakers, and with consultants:1} {Document social determinants of health affecting pt's care:1} {Document your decision making why or why not admission,  treatments were needed:1} Final Clinical Impression(s) / ED Diagnoses Final diagnoses:  Hypoglycemia    Rx / DC Orders ED Discharge Orders     None

## 2021-09-04 DIAGNOSIS — E1122 Type 2 diabetes mellitus with diabetic chronic kidney disease: Secondary | ICD-10-CM | POA: Diagnosis not present

## 2021-09-16 ENCOUNTER — Ambulatory Visit (INDEPENDENT_AMBULATORY_CARE_PROVIDER_SITE_OTHER): Payer: Self-pay | Admitting: Podiatry

## 2021-09-16 DIAGNOSIS — Z91199 Patient's noncompliance with other medical treatment and regimen due to unspecified reason: Secondary | ICD-10-CM

## 2021-09-16 NOTE — Progress Notes (Signed)
   Complete physical exam  Patient: Helen Richardson   DOB: 12/21/1996   88 y.o. Female  MRN: [id number]  Subjective:    No chief complaint on file.   Helen Richardson is a 88 y.o. female who presents today for a complete physical exam. She reports consuming a {diet types:17450} diet. {types:19826} She generally feels {DESC; WELL/FAIRLY WELL/POORLY:18703}. She reports sleeping {DESC; WELL/FAIRLY WELL/POORLY:18703}. She {does/does not:200015} have additional problems to discuss today.    Most recent fall risk assessment:    08/28/2021   10:42 AM  Fall Risk   Falls in the past year? 0  Number falls in past yr: 0  Injury with Fall? 0  Risk for fall due to : No Fall Risks  Follow up Falls evaluation completed     Most recent depression screenings:    08/28/2021   10:42 AM 07/19/2020   10:46 AM  PHQ 2/9 Scores  PHQ - 2 Score 0 0  PHQ- 9 Score 5     {VISON DENTAL STD PSA (Optional):27386}  {History (Optional):23778}  Patient Care Team: Jessup, Joy, NP as PCP - General (Nurse Practitioner)   Outpatient Medications Prior to Visit  Medication Sig   fluticasone (FLONASE) 50 MCG/ACT nasal spray Place 2 sprays into both nostrils in the morning and at bedtime. After 7 days, reduce to once daily.   norgestimate-ethinyl estradiol (SPRINTEC 28) 0.25-35 MG-MCG tablet Take 1 tablet by mouth daily.   Nystatin POWD Apply liberally to affected area 2 times per day   spironolactone (ALDACTONE) 100 MG tablet Take 1 tablet (100 mg total) by mouth daily.   No facility-administered medications prior to visit.    ROS        Objective:     There were no vitals taken for this visit. {Vitals History (Optional):23777}  Physical Exam   No results found for any visits on 10/03/21. {Show previous labs (optional):23779}    Assessment & Plan:    Routine Health Maintenance and Physical Exam  Immunization History  Administered Date(s) Administered   DTaP 03/06/1999, 05/02/1999,  07/11/1999, 03/26/2000, 10/10/2003   Hepatitis A 08/06/2007, 08/11/2008   Hepatitis B 12/22/1998, 01/29/1999, 07/11/1999   HiB (PRP-OMP) 03/06/1999, 05/02/1999, 07/11/1999, 03/26/2000   IPV 03/06/1999, 05/02/1999, 12/30/1999, 10/10/2003   Influenza,inj,Quad PF,6+ Mos 11/11/2013   Influenza-Unspecified 02/11/2012   MMR 12/29/2000, 10/10/2003   Meningococcal Polysaccharide 08/11/2011   Pneumococcal Conjugate-13 03/26/2000   Pneumococcal-Unspecified 07/11/1999, 09/24/1999   Tdap 08/11/2011   Varicella 12/30/1999, 08/06/2007    Health Maintenance  Topic Date Due   HIV Screening  Never done   Hepatitis C Screening  Never done   INFLUENZA VACCINE  10/01/2021   PAP-Cervical Cytology Screening  10/03/2021 (Originally 12/21/2019)   PAP SMEAR-Modifier  10/03/2021 (Originally 12/21/2019)   TETANUS/TDAP  10/03/2021 (Originally 08/10/2021)   HPV VACCINES  Discontinued   COVID-19 Vaccine  Discontinued    Discussed health benefits of physical activity, and encouraged her to engage in regular exercise appropriate for her age and condition.  Problem List Items Addressed This Visit   None Visit Diagnoses     Annual physical exam    -  Primary   Cervical cancer screening       Need for Tdap vaccination          No follow-ups on file.     Joy Jessup, NP   

## 2021-09-18 ENCOUNTER — Telehealth: Payer: Self-pay

## 2021-09-18 NOTE — Telephone Encounter (Signed)
1351- attempted to contact pt at home number [phone] with no answer. Attempted to contact pt on  mobile [phone]. Spoke with Ashley(granddaughter) who is in [place] and requested Office manager (emergency contact) to speak with pt. Attempted to contact V. Franzel at [phone]- number not in service.

## 2021-09-27 DIAGNOSIS — E113553 Type 2 diabetes mellitus with stable proliferative diabetic retinopathy, bilateral: Secondary | ICD-10-CM | POA: Diagnosis not present

## 2021-09-27 DIAGNOSIS — E7800 Pure hypercholesterolemia, unspecified: Secondary | ICD-10-CM | POA: Diagnosis not present

## 2021-09-27 DIAGNOSIS — I10 Essential (primary) hypertension: Secondary | ICD-10-CM | POA: Diagnosis not present

## 2021-10-05 DIAGNOSIS — E1122 Type 2 diabetes mellitus with diabetic chronic kidney disease: Secondary | ICD-10-CM | POA: Diagnosis not present

## 2021-10-11 ENCOUNTER — Telehealth: Payer: Self-pay

## 2021-10-11 NOTE — Telephone Encounter (Signed)
Left patient voicemail to give the office a call back, to schedule & complete AWV. Left voicemail on home & mobile phone.

## 2021-10-18 ENCOUNTER — Ambulatory Visit (INDEPENDENT_AMBULATORY_CARE_PROVIDER_SITE_OTHER): Payer: Medicare PPO

## 2021-10-18 DIAGNOSIS — Z0000 Encounter for general adult medical examination without abnormal findings: Secondary | ICD-10-CM | POA: Diagnosis not present

## 2021-10-18 NOTE — Progress Notes (Signed)
Subjective:   Helen Richardson is a 88 y.o. female who presents for Medicare Annual (Subsequent) preventive examination. I connected with  Helen Richardson on 10/18/21 by a audio enabled telemedicine application and verified that I am speaking with the correct person using two identifiers.  Patient Location: Home  Provider Location: Home Office  I discussed the limitations of evaluation and management by telemedicine. The patient expressed understanding and agreed to proceed.        Objective:    Today's Vitals   10/18/21 1100  PainSc: 0-No pain   There is no height or weight on file to calculate BMI.     08/31/2021    2:40 PM 07/13/2021    3:49 PM 11/15/2020    2:34 PM 05/20/2020    7:36 PM 03/01/2019    3:41 PM 07/01/2018    6:38 PM 06/10/2018    1:44 PM  Advanced Directives  Does Patient Have a Medical Advance Directive? No No No No No No No  Would patient like information on creating a medical advance directive? No - Patient declined  No - Patient declined   No - Patient declined No - Patient declined    Current Medications (verified) Outpatient Encounter Medications as of 10/18/2021  Medication Sig   Alcohol Swabs (ALCOHOL PREP) 70 % PADS    aspirin 81 MG chewable tablet Chew 81 mg by mouth at bedtime.   Calcium Carbonate-Vitamin D (CALTRATE 600+D PO) Take 1 tablet by mouth daily.   carvedilol (COREG) 6.25 MG tablet Take 1 tablet (6.25 mg total) by mouth 2 (two) times daily.   cephALEXin (KEFLEX) 250 MG capsule Take 1 capsule (250 mg total) by mouth 4 (four) times daily.   diclofenac sodium (VOLTAREN) 1 % GEL APPLY TO AFFECTED AREA 3 TIMES DAILY AS NEEDED (Patient taking differently: Apply 2 g topically 3 (three) times daily as needed (for pain).)   furosemide (LASIX) 40 MG tablet TAKE 1 TABLET(40 MG) BY MOUTH DAILY   glucose blood test strip Test up to 4 times daily   insulin degludec (TRESIBA FLEXTOUCH) 200 UNIT/ML FlexTouch Pen Inject 40 Units into the skin daily. INJECT  36 UNITS UNDER THE SKIN DAILY. (Patient taking differently: Inject 40 Units into the skin daily.)   Insulin Pen Needle 32G X 4 MM MISC Use with insulin Dx code e11.65   potassium chloride SA (KLOR-CON) 20 MEQ tablet Take 1 tablet (20 mEq total) by mouth once for 1 dose. Please d/c 2 rx for 2 times per day thanks (Patient taking differently: Take 20 mEq by mouth daily.)   Semaglutide,0.25 or 0.5MG /DOS, (OZEMPIC, 0.25 OR 0.5 MG/DOSE,) 2 MG/1.5ML SOPN Inject 0.25 mg into the skin once a week.   telmisartan (MICARDIS) 80 MG tablet TAKE 1 TABLET(80 MG) BY MOUTH DAILY   UNABLE TO FIND lancets 30 gauge   UNABLE TO FIND Comfort EZ Pen Needles 32 gauge x 5/32"   rosuvastatin (CRESTOR) 10 MG tablet Take 1 tablet (10 mg total) by mouth See admin instructions. MONDAY-FRIDAY. DO NOT TAKE ON SATURDAY OR SUNDAY (Patient not taking: Reported on 10/18/2021)   No facility-administered encounter medications on file as of 10/18/2021.    Allergies (verified) Tradjenta [linagliptin]   History: Past Medical History:  Diagnosis Date   CHF (congestive heart failure) (HCC)    Diabetes mellitus without complication (HCC)    High cholesterol    HTN (hypertension)    Obesity    Past Surgical History:  Procedure Laterality Date  CATARACT EXTRACTION, BILATERAL     ingrown toenail Left    left great toenail   PARTIAL HYSTERECTOMY     Family History  Problem Relation Age of Onset   Diabetes Mother    Hypertension Mother    Hypertension Father    Stroke Father    Social History   Socioeconomic History   Marital status: Widowed    Spouse name: Not on file   Number of children: Not on file   Years of education: Not on file   Highest education level: Not on file  Occupational History   Occupation: retired  Tobacco Use   Smoking status: Never   Smokeless tobacco: Never  Vaping Use   Vaping Use: Never used  Substance and Sexual Activity   Alcohol use: No   Drug use: No   Sexual activity: Not  Currently  Other Topics Concern   Not on file  Social History Narrative   Not on file   Social Determinants of Health   Financial Resource Strain: Low Risk  (10/18/2021)   Overall Financial Resource Strain (CARDIA)    Difficulty of Paying Living Expenses: Not hard at all  Food Insecurity: No Food Insecurity (10/18/2021)   Hunger Vital Sign    Worried About Running Out of Food in the Last Year: Never true    Ran Out of Food in the Last Year: Never true  Transportation Needs: No Transportation Needs (10/18/2021)   PRAPARE - Administrator, Civil Service (Medical): No    Lack of Transportation (Non-Medical): No  Physical Activity: Inactive (10/18/2021)   Exercise Vital Sign    Days of Exercise per Week: 0 days    Minutes of Exercise per Session: 0 min  Stress: No Stress Concern Present (10/18/2021)   [employer] of Occupational Health - Occupational Stress Questionnaire    Feeling of Stress : Not at all  Social Connections: Socially Isolated (10/18/2021)   Social Connection and Isolation Panel [NHANES]    Frequency of Communication with Friends and Family: More than three times a week    Frequency of Social Gatherings with Friends and Family: More than three times a week    Attends Religious Services: Never    Database administrator or Organizations: No    Attends Banker Meetings: Never    Marital Status: Widowed    Tobacco Counseling Counseling given: Not Answered   Clinical Intake:  Pre-visit preparation completed: Yes  Pain : No/denies pain Pain Score: 0-No pain        How often do you need to have someone help you when you read instructions, pamphlets, or other written materials from your doctor or pharmacy?: 3 - Sometimes What is the last grade level you completed in school?: 12th grade  Diabetic?yes   Interpreter Needed?: No      Activities of Daily Living    10/18/2021   10:50 AM 11/15/2020    2:36 PM  In your present state  of health, do you have any difficulty performing the following activities:  Hearing? 1 1  Vision? 0 0  Difficulty concentrating or making decisions? 1 1  Walking or climbing stairs? 1 1  Dressing or bathing? 1 1  Doing errands, shopping? 1 1  Comment  always has someone with  Preparing Food and eating ?  Y  Using the Toilet?  N  In the past six months, have you accidently leaked urine?  N  Do you have problems with  loss of bowel control?  N  Managing your Medications?  Y  Managing your Finances?  N  Housekeeping or managing your Housekeeping?  Y    Patient Care Team: [facility] as PCP - General Caudill, Kennieth Francois, RPH (Inactive) (Pharmacist) Mayford Knife, [facility] (Pharmacist)  Indicate any recent Medical Services you may have received from other than [facility] providers in the past year (date may be approximate).     Assessment:   This is a routine wellness examination for Tandi.  Hearing/Vision screen No results found.  Dietary issues and exercise activities discussed:     Goals Addressed   None   Depression Screen    10/18/2021   10:46 AM 11/15/2020    2:35 PM 03/14/2020    2:13 PM 03/01/2019    3:42 PM 12/14/2018    3:44 PM 07/12/2018   12:02 PM 05/18/2018   12:17 PM  PHQ 2/9 Scores  PHQ - 2 Score 0 0 3 0 0 0 0  PHQ- 9 Score   3 0       Fall Risk    10/18/2021   10:50 AM 11/15/2020    2:35 PM 03/14/2020    2:15 PM 03/01/2019    3:41 PM 12/14/2018    3:43 PM  [place] in the past year? 0 1 0 0 0  Number falls in past yr: 0 0 0    Injury with Fall? 0 0     Risk for fall due to : No Fall Risks Medication side effect;Impaired mobility  Medication side effect;Impaired balance/gait;Impaired mobility   Follow up Falls evaluation completed Falls evaluation completed;Education provided;Falls prevention discussed  Falls evaluation completed;Education provided;Falls prevention discussed     FALL RISK PREVENTION PERTAINING TO THE HOME:  Any  stairs in or around the home? No  If so, are there any without handrails? No  Home free of loose throw rugs in walkways, pet beds, electrical cords, etc? No  Adequate lighting in your home to reduce risk of falls? Yes   ASSISTIVE DEVICES UTILIZED TO PREVENT FALLS:  Life alert? Yes  Use of a cane, walker or w/c? Yes  Grab bars in the bathroom? Yes  Shower chair or bench in shower? Yes  Elevated toilet seat or a handicapped toilet? Yes     Cognitive Function:        10/18/2021   10:52 AM 11/15/2020    2:37 PM 03/14/2020    2:30 PM 03/01/2019    3:45 PM  6CIT Screen  What Year? 0 points 0 points 0 points 4 points  What month? 0 points 0 points 0 points 0 points  What time? 0 points 0 points 0 points 0 points  Count back from 20 0 points 0 points 0 points 0 points  Months in reverse 0 points 0 points 0 points 0 points  Repeat phrase 0 points 0 points 2 points 0 points  Total Score 0 points 0 points 2 points 4 points    Immunizations Immunization History  Administered Date(s) Administered   Fluad Quad(high Dose 65+) 03/14/2020, 11/15/2020   Influenza, High Dose Seasonal PF 11/19/2017, 12/14/2018    TDAP status: Due, Education has been provided regarding the importance of this vaccine. Advised may receive this vaccine at local pharmacy or Health Dept. Aware to provide a copy of the vaccination record if obtained from local pharmacy or Health Dept. Verbalized acceptance and understanding.  Flu Vaccine status: Due, Education has been  provided regarding the importance of this vaccine. Advised may receive this vaccine at local pharmacy or Health Dept. Aware to provide a copy of the vaccination record if obtained from local pharmacy or Health Dept. Verbalized acceptance and understanding.  Pneumococcal vaccine status: Due, Education has been provided regarding the importance of this vaccine. Advised may receive this vaccine at local pharmacy or Health Dept. Aware to provide a copy of  the vaccination record if obtained from local pharmacy or Health Dept. Verbalized acceptance and understanding.  Covid-19 vaccine status: Declined, Education has been provided regarding the importance of this vaccine but patient still declined. Advised may receive this vaccine at local pharmacy or Health Dept.or vaccine clinic. Aware to provide a copy of the vaccination record if obtained from local pharmacy or Health Dept. Verbalized acceptance and understanding.  Qualifies for Shingles Vaccine? Yes   Zostavax completed No   Shingrix Completed?: No.    Education has been provided regarding the importance of this vaccine. Patient has been advised to call insurance company to determine out of pocket expense if they have not yet received this vaccine. Advised may also receive vaccine at local pharmacy or Health Dept. Verbalized acceptance and understanding.  Screening Tests Health Maintenance  Topic Date Due   COVID-19 Vaccine (1) Never done   TETANUS/TDAP  Never done   Zoster Vaccines- Shingrix (1 of 2) Never done   Pneumonia Vaccine 79+ Years old (1 - PCV) Never done   OPHTHALMOLOGY EXAM  09/28/2019   HEMOGLOBIN A1C  08/21/2021   INFLUENZA VACCINE  10/01/2021   FOOT EXAM  10/30/2021   DEXA SCAN  Completed   HPV VACCINES  Aged Out    Health Maintenance  Health Maintenance Due  Topic Date Due   COVID-19 Vaccine (1) Never done   TETANUS/TDAP  Never done   Zoster Vaccines- Shingrix (1 of 2) Never done   Pneumonia Vaccine 32+ Years old (1 - PCV) Never done   OPHTHALMOLOGY EXAM  09/28/2019   HEMOGLOBIN A1C  08/21/2021   INFLUENZA VACCINE  10/01/2021    Colorectal cancer screening: No longer required.   Mammogram status: No longer required due to n/a.  Lung Cancer Screening: (Low Dose CT Chest recommended if Age 22-80 years, 30 pack-year currently smoking OR have quit w/in 15years.) does not qualify.   Lung Cancer Screening Referral: n/a  Additional Screening:  Hepatitis C  Screening: does qualify; Completed n/a  Vision Screening: Recommended annual ophthalmology exams for early detection of glaucoma and other disorders of the eye. Is the patient up to date with their annual eye exam?  No  Who is the provider or what is the name of the office in which the patient attends annual eye exams? Patient stated provider on [employer], cannot remember name. If pt is not established with a provider, would they like to be referred to a provider to establish care? No .   Dental Screening: Recommended annual dental exams for proper oral hygiene  Community Resource Referral / Chronic Care Management: CRR required this visit?  No   CCM required this visit?  No      Plan:     I have personally reviewed and noted the following in the patient's chart:   Medical and social history Use of alcohol, tobacco or illicit drugs  Current medications and supplements including opioid prescriptions. Patient is not currently taking opioid prescriptions. Functional ability and status Nutritional status Physical activity Advanced directives List of other physicians Hospitalizations, surgeries, and  ER visits in previous 12 months Vitals Screenings to include cognitive, depression, and falls Referrals and appointments  In addition, I have reviewed and discussed with patient certain preventive protocols, quality metrics, and best practice recommendations. A written personalized care plan for preventive services as well as general preventive health recommendations were provided to patient.     Debbora Dus, [place]   10/18/2021   Nurse Notes: provider notified.

## 2021-10-18 NOTE — Patient Instructions (Signed)

## 2021-11-01 DIAGNOSIS — E119 Type 2 diabetes mellitus without complications: Secondary | ICD-10-CM | POA: Diagnosis not present

## 2021-11-01 DIAGNOSIS — Z794 Long term (current) use of insulin: Secondary | ICD-10-CM | POA: Diagnosis not present

## 2021-11-05 DIAGNOSIS — E1165 Type 2 diabetes mellitus with hyperglycemia: Secondary | ICD-10-CM | POA: Diagnosis not present

## 2021-11-19 ENCOUNTER — Ambulatory Visit: Payer: Medicare PPO | Admitting: Podiatry

## 2021-11-26 ENCOUNTER — Ambulatory Visit (INDEPENDENT_AMBULATORY_CARE_PROVIDER_SITE_OTHER): Payer: Medicare PPO | Admitting: Podiatry

## 2021-11-26 ENCOUNTER — Encounter: Payer: Self-pay | Admitting: Podiatry

## 2021-11-26 DIAGNOSIS — E1140 Type 2 diabetes mellitus with diabetic neuropathy, unspecified: Secondary | ICD-10-CM | POA: Diagnosis not present

## 2021-11-26 DIAGNOSIS — B351 Tinea unguium: Secondary | ICD-10-CM

## 2021-11-26 DIAGNOSIS — Z794 Long term (current) use of insulin: Secondary | ICD-10-CM

## 2021-11-26 DIAGNOSIS — L608 Other nail disorders: Secondary | ICD-10-CM

## 2021-11-26 DIAGNOSIS — M79676 Pain in unspecified toe(s): Secondary | ICD-10-CM | POA: Diagnosis not present

## 2021-11-26 DIAGNOSIS — N179 Acute kidney failure, unspecified: Secondary | ICD-10-CM

## 2021-11-26 NOTE — Progress Notes (Signed)
This patient returns to my office for at risk foot care.  This patient requires this care by a professional since this patient will be at risk due to having diabetes type 2.  Patient has not been seen in over 15 months. This patient is unable to cut nails herself since the patient cannot reach her nails.These nails are painful walking and wearing shoes.  This patient has not been seen in over 5  months.  This patient presents for at risk foot care today.  General Appearance  Alert, conversant and in no acute stress.  Vascular  Dorsalis pedis and posterior tibial  pulses are weakly  palpable  bilaterally.  Capillary return is within normal limits  bilaterally. Temperature is within normal limits  bilaterally.  Neurologic  Senn-Weinstein monofilament wire test diminished  bilaterally. Muscle power within normal limits bilaterally.  Nails Thick disfigured discolored nails with subungual debris  from hallux to fifth toes bilaterally. No evidence of bacterial infection or drainage bilaterally.  Pincer nails   Orthopedic  No limitations of motion  feet .  No crepitus or effusions noted.  HAV  B/L.  Hammer toes  B/L.  Skin  normotropic skin with no porokeratosis noted bilaterally.  No signs of infections or ulcers noted.     Onychomycosis  Pain in right toes  Pain in left toes  Consent was obtained for treatment procedures.   Mechanical debridement of nails 1-5  bilaterally performed with a nail nipper.  Filed with dremel without incident.    Return office visit    4 months                  Told patient to return for periodic foot care and evaluation due to potential at risk complications.   Gardiner Barefoot DPM

## 2021-12-03 ENCOUNTER — Ambulatory Visit: Payer: Medicare PPO | Admitting: Podiatry

## 2021-12-11 ENCOUNTER — Ambulatory Visit: Payer: Medicare Other | Admitting: Internal Medicine

## 2021-12-11 ENCOUNTER — Ambulatory Visit: Payer: Medicare Other

## 2021-12-25 ENCOUNTER — Ambulatory Visit: Payer: Medicare PPO | Admitting: Podiatry

## 2022-02-26 ENCOUNTER — Ambulatory Visit: Payer: Medicare PPO | Admitting: Podiatry

## 2022-02-26 DIAGNOSIS — I5022 Chronic systolic (congestive) heart failure: Secondary | ICD-10-CM | POA: Diagnosis not present

## 2022-02-26 DIAGNOSIS — E1165 Type 2 diabetes mellitus with hyperglycemia: Secondary | ICD-10-CM | POA: Diagnosis not present

## 2022-02-26 DIAGNOSIS — B356 Tinea cruris: Secondary | ICD-10-CM | POA: Diagnosis not present

## 2022-02-26 DIAGNOSIS — Z794 Long term (current) use of insulin: Secondary | ICD-10-CM | POA: Diagnosis not present

## 2022-02-26 DIAGNOSIS — I110 Hypertensive heart disease with heart failure: Secondary | ICD-10-CM | POA: Diagnosis not present

## 2022-02-26 DIAGNOSIS — R053 Chronic cough: Secondary | ICD-10-CM | POA: Diagnosis not present

## 2022-02-26 DIAGNOSIS — E7800 Pure hypercholesterolemia, unspecified: Secondary | ICD-10-CM | POA: Diagnosis not present

## 2022-02-26 DIAGNOSIS — R54 Age-related physical debility: Secondary | ICD-10-CM | POA: Diagnosis not present

## 2022-04-10 ENCOUNTER — Telehealth: Payer: Self-pay

## 2022-04-10 NOTE — Telephone Encounter (Signed)
Called [facility] to confirm if she is established. Spoke with Clide Dales, she states she is established. She was recently seen on 03/26/2022.

## 2022-05-06 ENCOUNTER — Ambulatory Visit (INDEPENDENT_AMBULATORY_CARE_PROVIDER_SITE_OTHER): Payer: Medicare PPO | Admitting: Podiatry

## 2022-05-06 ENCOUNTER — Encounter: Payer: Self-pay | Admitting: Podiatry

## 2022-05-06 DIAGNOSIS — L608 Other nail disorders: Secondary | ICD-10-CM

## 2022-05-06 DIAGNOSIS — B351 Tinea unguium: Secondary | ICD-10-CM | POA: Diagnosis not present

## 2022-05-06 DIAGNOSIS — M79676 Pain in unspecified toe(s): Secondary | ICD-10-CM | POA: Diagnosis not present

## 2022-05-06 DIAGNOSIS — E1140 Type 2 diabetes mellitus with diabetic neuropathy, unspecified: Secondary | ICD-10-CM | POA: Diagnosis not present

## 2022-05-06 DIAGNOSIS — Z794 Long term (current) use of insulin: Secondary | ICD-10-CM

## 2022-05-06 NOTE — Progress Notes (Signed)
This patient returns to my office for at risk foot care.  This patient requires this care by a professional since this patient will be at risk due to having diabetes type 2.  Patient has not been seen in over 15 months. This patient is unable to cut nails herself since the patient cannot reach her nails.These nails are painful walking and wearing shoes.   This patient presents for at risk foot care today.  General Appearance  Alert, conversant and in no acute stress.  Vascular  Dorsalis pedis and posterior tibial  pulses are weakly  palpable  bilaterally.  Capillary return is within normal limits  bilaterally. Temperature is within normal limits  bilaterally.  Neurologic  Senn-Weinstein monofilament wire test diminished  bilaterally. Muscle power within normal limits bilaterally.  Nails Thick disfigured discolored nails with subungual debris  from hallux to fifth toes bilaterally. No evidence of bacterial infection or drainage bilaterally.  Pincer nails   Orthopedic  No limitations of motion  feet .  No crepitus or effusions noted.  HAV  B/L.  Hammer toes  B/L.  Skin  normotropic skin with no porokeratosis noted bilaterally.  No signs of infections or ulcers noted.     Onychomycosis  Pain in right toes  Pain in left toes  Consent was obtained for treatment procedures.   Mechanical debridement of nails 1-5  bilaterally performed with a nail nipper.  Filed with dremel without incident.    Return office visit    3 months                  Told patient to return for periodic foot care and evaluation due to potential at risk complications.   Gardiner Barefoot DPM

## 2022-06-03 ENCOUNTER — Ambulatory Visit: Payer: Medicare PPO | Admitting: Podiatry

## 2022-08-05 ENCOUNTER — Ambulatory Visit (INDEPENDENT_AMBULATORY_CARE_PROVIDER_SITE_OTHER): Payer: Medicare PPO | Admitting: Podiatry

## 2022-08-05 ENCOUNTER — Encounter: Payer: Self-pay | Admitting: Podiatry

## 2022-08-05 DIAGNOSIS — E1140 Type 2 diabetes mellitus with diabetic neuropathy, unspecified: Secondary | ICD-10-CM

## 2022-08-05 DIAGNOSIS — B351 Tinea unguium: Secondary | ICD-10-CM | POA: Diagnosis not present

## 2022-08-05 DIAGNOSIS — M79676 Pain in unspecified toe(s): Secondary | ICD-10-CM

## 2022-08-05 DIAGNOSIS — L608 Other nail disorders: Secondary | ICD-10-CM

## 2022-08-05 DIAGNOSIS — Z794 Long term (current) use of insulin: Secondary | ICD-10-CM

## 2022-08-05 NOTE — Progress Notes (Signed)
This patient returns to my office for at risk foot care.  This patient requires this care by a professional since this patient will be at risk due to having diabetes type 2.  Patient has not been seen in over 15 months. This patient is unable to cut nails herself since the patient cannot reach her nails.These nails are painful walking and wearing shoes.   This patient presents for at risk foot care today.  General Appearance  Alert, conversant and in no acute stress.  Vascular  Dorsalis pedis and posterior tibial  pulses are weakly  palpable  bilaterally.  Capillary return is within normal limits  bilaterally. Temperature is within normal limits  bilaterally.  Neurologic  Senn-Weinstein monofilament wire test diminished  bilaterally. Muscle power within normal limits bilaterally.  Nails Thick disfigured discolored nails with subungual debris  from hallux to fifth toes bilaterally. No evidence of bacterial infection or drainage bilaterally.  Pincer nails   Orthopedic  No limitations of motion  feet .  No crepitus or effusions noted.  HAV  B/L.  Hammer toes  B/L.  Skin  normotropic skin with no porokeratosis noted bilaterally.  No signs of infections or ulcers noted.     Onychomycosis  Pain in right toes  Pain in left toes  Consent was obtained for treatment procedures.   Mechanical debridement of nails 1-5  bilaterally performed with a nail nipper.  Filed with dremel without incident.    Return office visit    3 months                  Told patient to return for periodic foot care and evaluation due to potential at risk complications.   Malie Kashani DPM  

## 2022-11-05 ENCOUNTER — Ambulatory Visit: Payer: Medicare PPO | Admitting: Podiatry

## 2022-11-13 ENCOUNTER — Ambulatory Visit (INDEPENDENT_AMBULATORY_CARE_PROVIDER_SITE_OTHER): Payer: Medicare PPO | Admitting: Podiatry

## 2022-11-13 ENCOUNTER — Encounter: Payer: Self-pay | Admitting: Podiatry

## 2022-11-13 DIAGNOSIS — L608 Other nail disorders: Secondary | ICD-10-CM

## 2022-11-13 DIAGNOSIS — Z794 Long term (current) use of insulin: Secondary | ICD-10-CM

## 2022-11-13 DIAGNOSIS — M2010 Hallux valgus (acquired), unspecified foot: Secondary | ICD-10-CM

## 2022-11-13 DIAGNOSIS — M2041 Other hammer toe(s) (acquired), right foot: Secondary | ICD-10-CM | POA: Diagnosis not present

## 2022-11-13 DIAGNOSIS — B351 Tinea unguium: Secondary | ICD-10-CM | POA: Diagnosis not present

## 2022-11-13 DIAGNOSIS — M79676 Pain in unspecified toe(s): Secondary | ICD-10-CM | POA: Diagnosis not present

## 2022-11-13 DIAGNOSIS — E1140 Type 2 diabetes mellitus with diabetic neuropathy, unspecified: Secondary | ICD-10-CM

## 2022-11-13 DIAGNOSIS — M2042 Other hammer toe(s) (acquired), left foot: Secondary | ICD-10-CM

## 2022-11-13 NOTE — Progress Notes (Signed)
This patient returns to my office for at risk foot care.  This patient requires this care by a professional since this patient will be at risk due to having diabetes type 2.  Patient has not been seen in over 15 months. This patient is unable to cut nails herself since the patient cannot reach her nails.These nails are painful walking and wearing shoes.   This patient presents for at risk foot care today.  General Appearance  Alert, conversant and in no acute stress.  Vascular  Dorsalis pedis and posterior tibial  pulses are weakly  palpable  bilaterally.  Capillary return is within normal limits  bilaterally. Temperature is within normal limits  bilaterally.  Neurologic  Senn-Weinstein monofilament wire test diminished  bilaterally. Muscle power within normal limits bilaterally.  Nails Thick disfigured discolored nails with subungual debris  from hallux to fifth toes bilaterally. No evidence of bacterial infection or drainage bilaterally.  Pincer nails   Orthopedic  No limitations of motion  feet .  No crepitus or effusions noted.  HAV  B/L.  Hammer toes  B/L.  Skin  normotropic skin with no porokeratosis noted bilaterally.  No signs of infections or ulcers noted.     Onychomycosis  Pain in right toes  Pain in left toes  Consent was obtained for treatment procedures.   Mechanical debridement of nails 1-5  bilaterally performed with a nail nipper.  Filed with dremel without incident.    Return office visit    3 months                  Told patient to return for periodic foot care and evaluation due to potential at risk complications.   Gregory Mayer DPM  

## 2022-11-13 NOTE — Addendum Note (Signed)
Addended by: [name] on: 11/13/2022 04:08 PM   Modules accepted: Orders

## 2022-11-18 ENCOUNTER — Telehealth: Payer: Self-pay | Admitting: Internal Medicine

## 2022-11-18 NOTE — Telephone Encounter (Signed)
Called patient regarding 09/18 appointments, left a voicemail.

## 2022-11-19 ENCOUNTER — Encounter: Payer: Medicare PPO | Admitting: Internal Medicine

## 2022-11-19 ENCOUNTER — Inpatient Hospital Stay (HOSPITAL_BASED_OUTPATIENT_CLINIC_OR_DEPARTMENT_OTHER): Payer: Medicare PPO | Admitting: Internal Medicine

## 2022-11-19 ENCOUNTER — Other Ambulatory Visit: Payer: Self-pay | Admitting: Medical Oncology

## 2022-11-19 ENCOUNTER — Inpatient Hospital Stay: Payer: Medicare PPO | Attending: Internal Medicine

## 2022-11-19 VITALS — BP 124/58 | HR 83 | Temp 98.3°F | Resp 15 | Ht 62.5 in | Wt 176.0 lb

## 2022-11-19 DIAGNOSIS — E7800 Pure hypercholesterolemia, unspecified: Secondary | ICD-10-CM | POA: Insufficient documentation

## 2022-11-19 DIAGNOSIS — E1122 Type 2 diabetes mellitus with diabetic chronic kidney disease: Secondary | ICD-10-CM | POA: Insufficient documentation

## 2022-11-19 DIAGNOSIS — D649 Anemia, unspecified: Secondary | ICD-10-CM

## 2022-11-19 DIAGNOSIS — I130 Hypertensive heart and chronic kidney disease with heart failure and stage 1 through stage 4 chronic kidney disease, or unspecified chronic kidney disease: Secondary | ICD-10-CM | POA: Insufficient documentation

## 2022-11-19 DIAGNOSIS — R053 Chronic cough: Secondary | ICD-10-CM | POA: Diagnosis not present

## 2022-11-19 DIAGNOSIS — N189 Chronic kidney disease, unspecified: Secondary | ICD-10-CM | POA: Insufficient documentation

## 2022-11-19 DIAGNOSIS — Z7982 Long term (current) use of aspirin: Secondary | ICD-10-CM | POA: Diagnosis not present

## 2022-11-19 DIAGNOSIS — Z79899 Other long term (current) drug therapy: Secondary | ICD-10-CM | POA: Diagnosis not present

## 2022-11-19 DIAGNOSIS — K5900 Constipation, unspecified: Secondary | ICD-10-CM | POA: Insufficient documentation

## 2022-11-19 LAB — CBC WITH DIFFERENTIAL (CANCER CENTER ONLY)
Abs Immature Granulocytes: 0.05 10*3/uL (ref 0.00–0.07)
Basophils Absolute: 0.1 10*3/uL (ref 0.0–0.1)
Basophils Relative: 1 %
Eosinophils Absolute: 0.4 10*3/uL (ref 0.0–0.5)
Eosinophils Relative: 4 %
HCT: 27.1 % — ABNORMAL LOW (ref 36.0–46.0)
Hemoglobin: 8.2 g/dL — ABNORMAL LOW (ref 12.0–15.0)
Immature Granulocytes: 1 %
Lymphocytes Relative: 17 %
Lymphs Abs: 1.5 10*3/uL (ref 0.7–4.0)
MCH: 24 pg — ABNORMAL LOW (ref 26.0–34.0)
MCHC: 30.3 g/dL (ref 30.0–36.0)
MCV: 79.5 fL — ABNORMAL LOW (ref 80.0–100.0)
Monocytes Absolute: 0.6 10*3/uL (ref 0.1–1.0)
Monocytes Relative: 7 %
Neutro Abs: 6.1 10*3/uL (ref 1.7–7.7)
Neutrophils Relative %: 70 %
Platelet Count: 498 10*3/uL — ABNORMAL HIGH (ref 150–400)
RBC: 3.41 MIL/uL — ABNORMAL LOW (ref 3.87–5.11)
RDW: 22.4 % — ABNORMAL HIGH (ref 11.5–15.5)
WBC Count: 8.7 10*3/uL (ref 4.0–10.5)
nRBC: 0 % (ref 0.0–0.2)

## 2022-11-19 LAB — CMP (CANCER CENTER ONLY)
ALT: 6 U/L (ref 0–44)
AST: 11 U/L — ABNORMAL LOW (ref 15–41)
Albumin: 3.3 g/dL — ABNORMAL LOW (ref 3.5–5.0)
Alkaline Phosphatase: 51 U/L (ref 38–126)
Anion gap: 5 (ref 5–15)
BUN: 14 mg/dL (ref 8–23)
CO2: 32 mmol/L (ref 22–32)
Calcium: 8.9 mg/dL (ref 8.9–10.3)
Chloride: 98 mmol/L (ref 98–111)
Creatinine: 1.02 mg/dL — ABNORMAL HIGH (ref 0.44–1.00)
GFR, Estimated: 53 mL/min — ABNORMAL LOW (ref >60)
Glucose, Bld: 116 mg/dL — ABNORMAL HIGH (ref 70–99)
Potassium: 4.4 mmol/L (ref 3.5–5.1)
Sodium: 135 mmol/L (ref 135–145)
Total Bilirubin: 0.4 mg/dL (ref 0.3–1.2)
Total Protein: 6.6 g/dL (ref 6.5–8.1)

## 2022-11-19 LAB — IRON AND IRON BINDING CAPACITY (CC-WL,HP ONLY)
Iron: 113 ug/dL (ref 28–170)
Saturation Ratios: 34 % — ABNORMAL HIGH (ref 10.4–31.8)
TIBC: 333 ug/dL (ref 250–450)
UIBC: 220 ug/dL (ref 148–442)

## 2022-11-19 LAB — VITAMIN B12: Vitamin B-12: 630 pg/mL (ref 180–914)

## 2022-11-19 LAB — TSH: TSH: 0.912 u[IU]/mL (ref 0.350–4.500)

## 2022-11-19 LAB — FERRITIN: Ferritin: 15 ng/mL (ref 11–307)

## 2022-11-19 LAB — FOLATE: Folate: 12.4 ng/mL (ref >5.9)

## 2022-11-19 NOTE — Progress Notes (Signed)
[facility] [phone]   [phone]  CONSULT NOTE  REFERRING PHYSICIAN:   REASON FOR CONSULTATION:  89 years old African-American female with persistent anemia  HPI Helen Richardson is a 89 y.o. female. The patient, an 89 year old Black female, was referred to a hematologist due to a low blood count identified during a previous visit with their primary care provider. The patient was unaware of the issue until their last visit, which occurred in July. The patient's hemoglobin was reported to be 10.4 at that time. Despite the low blood count, the patient reports feeling "normal" with no fatigue or weakness. She denies experiencing any dizzy spells. However, the caregiver notes that the patient experiences shortness of breath with exertion, specifically when moving from the bathroom to the living room in the mornings. The patient denies any chest pain, coughing blood, nausea, vomiting, or diarrhea. She does report constipation, which has been managed with a stool softener. The patient has been taking iron supplements for approximately a month, which may have exacerbated the constipation. She denies any bleeding or bruising. A persistent cough was noted by the caregiver, but it is unclear if this is due to aspiration, seasonal allergies, or inadequate water intake. The patient's medical history includes congestive heart failure, diabetes, high cholesterol, high blood pressure, and chronic kidney disease, although the patient was unaware of the kidney disease. She denies any history of heart attacks or strokes.  Family history significant for mother with hypertension and diabetes mellitus.  Father had hypertension and stroke and a brother with questionable cancer. The patient is a non-smoker and does not consume alcohol or use street drugs. The patient lives at home with the assistance of a caregiver.  HPI  Past Medical History:  Diagnosis Date   CHF (congestive  heart failure) (HCC)    Diabetes mellitus without complication (HCC)    High cholesterol    HTN (hypertension)    Obesity     Past Surgical History:  Procedure Laterality Date   CATARACT EXTRACTION, BILATERAL     ingrown toenail Left    left great toenail   PARTIAL HYSTERECTOMY      Family History  Problem Relation Age of Onset   Diabetes Mother    Hypertension Mother    Hypertension Father    Stroke Father     Social History Social History   Tobacco Use   Smoking status: Never   Smokeless tobacco: Never  Vaping Use   Vaping status: Never Used  Substance Use Topics   Alcohol use: No   Drug use: No    Allergies  Allergen Reactions   Tradjenta [Linagliptin] Swelling    Current Outpatient Medications  Medication Sig Dispense Refill   Alcohol Swabs (ALCOHOL PREP) 70 % PADS      aspirin 81 MG chewable tablet Chew 81 mg by mouth at bedtime.     Calcium Carbonate-Vitamin D (CALTRATE 600+D PO) Take 1 tablet by mouth daily.     carvedilol (COREG) 6.25 MG tablet Take 1 tablet (6.25 mg total) by mouth 2 (two) times daily. 60 tablet 0   cephALEXin (KEFLEX) 250 MG capsule Take 1 capsule (250 mg total) by mouth 4 (four) times daily. 28 capsule 0   diclofenac sodium (VOLTAREN) 1 % GEL APPLY TO AFFECTED AREA 3 TIMES DAILY AS NEEDED (Patient taking differently: Apply 2 g topically 3 (three) times daily as needed (for pain).) 100 g 1   furosemide (LASIX) 40 MG tablet TAKE  1 TABLET(40 MG) BY MOUTH DAILY 30 tablet 0   glucose blood test strip Test up to 4 times daily 200 each 12   insulin degludec (TRESIBA FLEXTOUCH) 200 UNIT/ML FlexTouch Pen Inject 40 Units into the skin daily. INJECT 36 UNITS UNDER THE SKIN DAILY. (Patient taking differently: Inject 40 Units into the skin daily.) 15 mL 4   Insulin Pen Needle 32G X 4 MM MISC Use with insulin Dx code e11.65 100 each 3   potassium chloride SA (KLOR-CON) 20 MEQ tablet Take 1 tablet (20 mEq total) by mouth once for 1 dose. Please d/c 2  rx for 2 times per day thanks (Patient taking differently: Take 20 mEq by mouth daily.) 90 tablet 1   rosuvastatin (CRESTOR) 10 MG tablet Take 1 tablet (10 mg total) by mouth See admin instructions. MONDAY-FRIDAY. DO NOT TAKE ON SATURDAY OR SUNDAY 75 tablet 3   Semaglutide,0.25 or 0.5MG /DOS, (OZEMPIC, 0.25 OR 0.5 MG/DOSE,) 2 MG/1.5ML SOPN Inject 0.25 mg into the skin once a week. 4.5 mL 1   telmisartan (MICARDIS) 80 MG tablet TAKE 1 TABLET(80 MG) BY MOUTH DAILY 30 tablet 0   UNABLE TO FIND lancets 30 gauge     UNABLE TO FIND Comfort EZ Pen Needles 32 gauge x 5/32"     No current facility-administered medications for this visit.    Review of Systems  Constitutional: positive for fatigue Eyes: negative Ears, nose, mouth, throat, and face: negative Respiratory: positive for dyspnea on exertion Cardiovascular: negative Gastrointestinal: positive for constipation Genitourinary:negative Integument/breast: negative Hematologic/lymphatic: negative Musculoskeletal:positive for muscle weakness Neurological: negative Behavioral/Psych: negative Endocrine: negative Allergic/Immunologic: negative  Physical Exam  QMV:HQION, healthy, no distress, well nourished, and well developed SKIN: skin color, texture, turgor are normal, no rashes or significant lesions HEAD: Normocephalic, No masses, lesions, tenderness or abnormalities EYES: normal, PERRLA, Conjunctiva are pink and non-injected EARS: External ears normal, Canals clear OROPHARYNX:no exudate, no erythema, and lips, buccal mucosa, and tongue normal  NECK: supple, no adenopathy, no JVD LYMPH:  no palpable lymphadenopathy, no hepatosplenomegaly BREAST:not examined LUNGS: clear to auscultation , and palpation HEART: regular rate & rhythm, no murmurs, and no gallops ABDOMEN:abdomen soft, non-tender, normal bowel sounds, and no masses or organomegaly BACK: Back symmetric, no curvature., No CVA tenderness EXTREMITIES:no joint deformities,  effusion, or inflammation, no edema  NEURO: alert & oriented x 3 with fluent speech, no focal motor/sensory deficits  PERFORMANCE STATUS: ECOG 2  LABORATORY DATA: Lab Results  Component Value Date   WBC 8.4 08/31/2021   HGB 10.6 (L) 08/31/2021   HCT 33.9 (L) 08/31/2021   MCV 89.2 08/31/2021   PLT 277 08/31/2021      Chemistry      Component Value Date/Time   NA 138 08/31/2021 1445   NA 135 02/20/2021 1550   K 4.0 08/31/2021 1445   CL 104 08/31/2021 1445   CO2 29 08/31/2021 1445   BUN 18 08/31/2021 1445   BUN 14 02/20/2021 1550   CREATININE 0.96 08/31/2021 1445      Component Value Date/Time   CALCIUM 8.6 (L) 08/31/2021 1445   ALKPHOS 59 07/13/2021 1702   AST 17 07/13/2021 1702   ALT 12 07/13/2021 1702   BILITOT 0.5 07/13/2021 1702   BILITOT 0.3 11/15/2020 1542       RADIOGRAPHIC STUDIES: No results found.  ASSESSMENT AND PLAN: This is a very pleasant 89 years old African-American female with persistent anemia of unclear etiology concerning for anemia of chronic disease versus myelodysplasia.  Based  on the blood work performed today, may consider The patient for 1 unit of PRBCs transfusion in the next few days. Anemia Hemoglobin of 7.4, previously 10.6. No symptoms of fatigue, weakness, or dizziness. No history of bleeding or bruising. Currently taking iron supplementation for approximately one month. Constipation, possibly worsened by iron supplementation. -Order comprehensive blood work including CBC, iron studies, ferritin, vitamin B12, and folic acid to determine etiology of anemia. Repeat blood work today including CBC showed hemoglobin of 8.1 hematocrit 27.1 with MCV of 79.5.  The patient has normal total white blood count of 8.7 and elevated platelet count of 498,000.  Comprehensive metabolic panel is unremarkable except for elevated blood glucose of 116 and serum creatinine of 1.02.  Iron studies showed normal serum iron of 113 with iron saturation of 34%.   Ferritin level is still pending the patient has normal vitamin B12 and serum folate.  Other lab results including serum protein electrophoresis, TSH, erythropoietin are still pending.-Continue current iron supplementation. -Return for follow-up in two months to reassess response to treatment and repeat blood work.  Chronic Cough Dry cough, possibly due to inadequate hydration. No history of aspiration or seasonal allergies. -Encourage increased fluid intake.  Diabetes, Hypertension, Congestive Heart Failure, Hyperlipidemia, Chronic Kidney Disease Managed by primary care provider. -Continue follow-up with primary care provider for these conditions.     She was advised to call immediately if she has any other concerning symptoms in the interval. The patient voices understanding of current disease status and treatment options and is in agreement with the current care plan.  All questions were answered. The patient knows to call the clinic with any problems, questions or concerns. We can certainly see the patient much sooner if necessary.  Thank you so much for allowing me to participate in the care of Merina H Tomaso. I will continue to follow up the patient with you and assist in her care.  The total time spent in the appointment was 60 minutes.  Disclaimer: This note was dictated with voice recognition software. Similar sounding words can inadvertently be transcribed and may not be corrected upon review.   Lajuana Matte November 19, 2022, 12:31 PM

## 2022-11-21 LAB — ERYTHROPOIETIN: Erythropoietin: 30.9 m[IU]/mL — ABNORMAL HIGH (ref 2.6–18.5)

## 2022-11-23 LAB — PROTEIN ELECTROPHORESIS, SERUM, WITH REFLEX
A/G Ratio: 1 (ref 0.7–1.7)
Albumin ELP: 3 g/dL (ref 2.9–4.4)
Alpha-1-Globulin: 0.4 g/dL (ref 0.0–0.4)
Alpha-2-Globulin: 0.7 g/dL (ref 0.4–1.0)
Beta Globulin: 1.1 g/dL (ref 0.7–1.3)
Gamma Globulin: 1 g/dL (ref 0.4–1.8)
Globulin, Total: 3.1 g/dL (ref 2.2–3.9)
Total Protein ELP: 6.1 g/dL (ref 6.0–8.5)

## 2022-11-26 ENCOUNTER — Other Ambulatory Visit: Payer: Self-pay

## 2022-11-26 DIAGNOSIS — D649 Anemia, unspecified: Secondary | ICD-10-CM

## 2022-11-28 ENCOUNTER — Inpatient Hospital Stay: Payer: Medicare PPO

## 2022-11-28 ENCOUNTER — Telehealth: Payer: Self-pay

## 2022-11-28 NOTE — Telephone Encounter (Signed)
Called patient because she didn't show up for her blood infusion today. Patient stated she called yesterday and this morning to cancel her appointment and reschedule but no one called her back. Told patient we would get a message to have her rescheduled.

## 2022-12-02 ENCOUNTER — Other Ambulatory Visit: Payer: Medicare PPO

## 2022-12-16 ENCOUNTER — Telehealth: Payer: Self-pay | Admitting: Internal Medicine

## 2022-12-17 ENCOUNTER — Inpatient Hospital Stay: Payer: Medicare PPO

## 2022-12-17 ENCOUNTER — Other Ambulatory Visit: Payer: Medicare PPO

## 2022-12-17 ENCOUNTER — Telehealth: Payer: Self-pay | Admitting: Medical Oncology

## 2022-12-17 NOTE — Telephone Encounter (Signed)
Spoke to [place] . She did not know about appts and said the time is too early for pt and for [name]  ( she makes appts). She said to call [facility] after 1000 to r/s appt . She will coordinate pt ride.

## 2022-12-18 ENCOUNTER — Inpatient Hospital Stay: Payer: Medicare PPO | Attending: Internal Medicine

## 2022-12-18 DIAGNOSIS — D649 Anemia, unspecified: Secondary | ICD-10-CM | POA: Insufficient documentation

## 2022-12-18 LAB — CBC WITH DIFFERENTIAL (CANCER CENTER ONLY)
Abs Immature Granulocytes: 0.03 10*3/uL (ref 0.00–0.07)
Basophils Absolute: 0 10*3/uL (ref 0.0–0.1)
Basophils Relative: 0 %
Eosinophils Absolute: 0.3 10*3/uL (ref 0.0–0.5)
Eosinophils Relative: 3 %
HCT: 27.3 % — ABNORMAL LOW (ref 36.0–46.0)
Hemoglobin: 8.3 g/dL — ABNORMAL LOW (ref 12.0–15.0)
Immature Granulocytes: 0 %
Lymphocytes Relative: 12 %
Lymphs Abs: 1.2 10*3/uL (ref 0.7–4.0)
MCH: 25.5 pg — ABNORMAL LOW (ref 26.0–34.0)
MCHC: 30.4 g/dL (ref 30.0–36.0)
MCV: 84 fL (ref 80.0–100.0)
Monocytes Absolute: 0.8 10*3/uL (ref 0.1–1.0)
Monocytes Relative: 8 %
Neutro Abs: 7.6 10*3/uL (ref 1.7–7.7)
Neutrophils Relative %: 77 %
Platelet Count: 474 10*3/uL — ABNORMAL HIGH (ref 150–400)
RBC: 3.25 MIL/uL — ABNORMAL LOW (ref 3.87–5.11)
RDW: 21.7 % — ABNORMAL HIGH (ref 11.5–15.5)
WBC Count: 9.9 10*3/uL (ref 4.0–10.5)
nRBC: 0 % (ref 0.0–0.2)

## 2022-12-18 LAB — ABO/RH: ABO/RH(D): B POS

## 2022-12-18 LAB — SAMPLE TO BLOOD BANK

## 2022-12-20 ENCOUNTER — Inpatient Hospital Stay: Payer: Medicare PPO

## 2023-01-16 ENCOUNTER — Encounter (HOSPITAL_COMMUNITY): Payer: Self-pay

## 2023-01-16 ENCOUNTER — Inpatient Hospital Stay (HOSPITAL_COMMUNITY)
Admission: EM | Admit: 2023-01-16 | Discharge: 2023-01-20 | DRG: 070 | Disposition: A | Discharge status: Skilled Nursing Facility | Payer: Medicare PPO | Attending: Internal Medicine | Admitting: Internal Medicine

## 2023-01-16 ENCOUNTER — Emergency Department (HOSPITAL_COMMUNITY): Payer: Medicare PPO

## 2023-01-16 ENCOUNTER — Other Ambulatory Visit: Payer: Self-pay

## 2023-01-16 DIAGNOSIS — D649 Anemia, unspecified: Secondary | ICD-10-CM | POA: Diagnosis present

## 2023-01-16 DIAGNOSIS — D509 Iron deficiency anemia, unspecified: Secondary | ICD-10-CM | POA: Diagnosis present

## 2023-01-16 DIAGNOSIS — E11649 Type 2 diabetes mellitus with hypoglycemia without coma: Secondary | ICD-10-CM | POA: Diagnosis present

## 2023-01-16 DIAGNOSIS — N183 Chronic kidney disease, stage 3 unspecified: Secondary | ICD-10-CM

## 2023-01-16 DIAGNOSIS — Z79899 Other long term (current) drug therapy: Secondary | ICD-10-CM

## 2023-01-16 DIAGNOSIS — I10 Essential (primary) hypertension: Secondary | ICD-10-CM | POA: Diagnosis not present

## 2023-01-16 DIAGNOSIS — G9341 Metabolic encephalopathy: Secondary | ICD-10-CM | POA: Diagnosis not present

## 2023-01-16 DIAGNOSIS — D72829 Elevated white blood cell count, unspecified: Secondary | ICD-10-CM | POA: Diagnosis present

## 2023-01-16 DIAGNOSIS — R6511 Systemic inflammatory response syndrome (SIRS) of non-infectious origin with acute organ dysfunction: Secondary | ICD-10-CM | POA: Diagnosis present

## 2023-01-16 DIAGNOSIS — Z90711 Acquired absence of uterus with remaining cervical stump: Secondary | ICD-10-CM

## 2023-01-16 DIAGNOSIS — Z7982 Long term (current) use of aspirin: Secondary | ICD-10-CM

## 2023-01-16 DIAGNOSIS — N1831 Chronic kidney disease, stage 3a: Secondary | ICD-10-CM | POA: Diagnosis present

## 2023-01-16 DIAGNOSIS — Z823 Family history of stroke: Secondary | ICD-10-CM

## 2023-01-16 DIAGNOSIS — Z888 Allergy status to other drugs, medicaments and biological substances status: Secondary | ICD-10-CM

## 2023-01-16 DIAGNOSIS — I130 Hypertensive heart and chronic kidney disease with heart failure and stage 1 through stage 4 chronic kidney disease, or unspecified chronic kidney disease: Secondary | ICD-10-CM | POA: Diagnosis present

## 2023-01-16 DIAGNOSIS — E1122 Type 2 diabetes mellitus with diabetic chronic kidney disease: Secondary | ICD-10-CM | POA: Diagnosis present

## 2023-01-16 DIAGNOSIS — I5032 Chronic diastolic (congestive) heart failure: Secondary | ICD-10-CM | POA: Diagnosis present

## 2023-01-16 DIAGNOSIS — G9340 Encephalopathy, unspecified: Secondary | ICD-10-CM | POA: Diagnosis present

## 2023-01-16 DIAGNOSIS — Z833 Family history of diabetes mellitus: Secondary | ICD-10-CM

## 2023-01-16 DIAGNOSIS — R4182 Altered mental status, unspecified: Secondary | ICD-10-CM | POA: Diagnosis not present

## 2023-01-16 DIAGNOSIS — R6510 Systemic inflammatory response syndrome (SIRS) of non-infectious origin without acute organ dysfunction: Secondary | ICD-10-CM | POA: Diagnosis present

## 2023-01-16 DIAGNOSIS — E872 Acidosis, unspecified: Secondary | ICD-10-CM | POA: Diagnosis present

## 2023-01-16 DIAGNOSIS — E7800 Pure hypercholesterolemia, unspecified: Secondary | ICD-10-CM | POA: Diagnosis present

## 2023-01-16 DIAGNOSIS — R531 Weakness: Principal | ICD-10-CM

## 2023-01-16 DIAGNOSIS — Z8249 Family history of ischemic heart disease and other diseases of the circulatory system: Secondary | ICD-10-CM

## 2023-01-16 DIAGNOSIS — Z7985 Long-term (current) use of injectable non-insulin antidiabetic drugs: Secondary | ICD-10-CM

## 2023-01-16 DIAGNOSIS — Z794 Long term (current) use of insulin: Secondary | ICD-10-CM

## 2023-01-16 LAB — BASIC METABOLIC PANEL
Anion gap: 10 (ref 5–15)
BUN: 12 mg/dL (ref 8–23)
CO2: 25 mmol/L (ref 22–32)
Calcium: 8.8 mg/dL — ABNORMAL LOW (ref 8.9–10.3)
Chloride: 100 mmol/L (ref 98–111)
Creatinine, Ser: 1.05 mg/dL — ABNORMAL HIGH (ref 0.44–1.00)
GFR, Estimated: 51 mL/min — ABNORMAL LOW (ref >60)
Glucose, Bld: 237 mg/dL — ABNORMAL HIGH (ref 70–99)
Potassium: 5 mmol/L (ref 3.5–5.1)
Sodium: 135 mmol/L (ref 135–145)

## 2023-01-16 LAB — CBG MONITORING, ED
Glucose-Capillary: 59 mg/dL — ABNORMAL LOW (ref 70–99)
Glucose-Capillary: 63 mg/dL — ABNORMAL LOW (ref 70–99)
Glucose-Capillary: 182 mg/dL — ABNORMAL HIGH (ref 70–99)
Glucose-Capillary: 206 mg/dL — ABNORMAL HIGH (ref 70–99)

## 2023-01-16 LAB — CBC
HCT: 26.7 % — ABNORMAL LOW (ref 36.0–46.0)
Hemoglobin: 8.2 g/dL — ABNORMAL LOW (ref 12.0–15.0)
MCH: 24.3 pg — ABNORMAL LOW (ref 26.0–34.0)
MCHC: 30.7 g/dL (ref 30.0–36.0)
MCV: 79 fL — ABNORMAL LOW (ref 80.0–100.0)
Platelets: 552 10*3/uL — ABNORMAL HIGH (ref 150–400)
RBC: 3.38 MIL/uL — ABNORMAL LOW (ref 3.87–5.11)
RDW: 18.8 % — ABNORMAL HIGH (ref 11.5–15.5)
WBC: 13.6 10*3/uL — ABNORMAL HIGH (ref 4.0–10.5)
nRBC: 0 % (ref 0.0–0.2)

## 2023-01-16 LAB — I-STAT CG4 LACTIC ACID, ED
Lactic Acid, Venous: 2.9 mmol/L (ref 0.5–1.9)
Lactic Acid, Venous: 2.7 mmol/L (ref 0.5–1.9)

## 2023-01-16 MED ORDER — SODIUM CHLORIDE 0.9 % IV SOLN
1.0000 g | Freq: Once | INTRAVENOUS | Status: AC
  Administered 2023-01-16: 1 g via INTRAVENOUS
  Filled 2023-01-16: qty 10

## 2023-01-16 MED ORDER — SODIUM CHLORIDE 0.9 % IV BOLUS: 250.0000 mL | Freq: Once | INTRAVENOUS | Status: DC

## 2023-01-16 MED ORDER — LACTATED RINGERS IV BOLUS
500.0000 mL | Freq: Once | INTRAVENOUS | Status: AC
  Administered 2023-01-16: 500 mL via INTRAVENOUS

## 2023-01-16 NOTE — ED Notes (Signed)
LR BOLUS HELD DUE TO MILD HYPOGLYCEMIA

## 2023-01-16 NOTE — ED Triage Notes (Signed)
Pt BIB [facility] from home d/t AMS, fatigue & hypoglycemia. Her Dexcom was showing 15 & EMS reports they got 88 after juice was given PO. Pt is following commands, 12L good, 20g PIV in Rt AC. Is alert to person & place, confused to time & situation. BP was 98/58 & all other vss.

## 2023-01-16 NOTE — ED Provider Notes (Signed)
[place] [facility] Provider Note   CSN: [id number] Arrival date & time: 01/16/23  1534     History  Chief Complaint  Patient presents with   AMS   Fatigue   Hypotension   Hypoglycemia    Helen Richardson is a 89 y.o. female.   Hypoglycemia  Pt has history of DM, HTN, CHF, High Chol.  Pt is not sure whey she is here in the hospital.  Pt states her daughter called the ambulance.  Pt states she told her daughter she was hungtry and hadnt eaten much all day.  Per EMS pt's bloos sugar was 57.  It increased to 88 after juice.    Pt is not sure if she was feeling sick.  She states she is feeling and feels back to baseline.    Home Medications Prior to Admission medications   Medication Sig Start Date End Date Taking? Authorizing Provider  Alcohol Swabs (ALCOHOL PREP) 70 % PADS  02/09/13   [provider]  aspirin 81 MG chewable tablet Chew 81 mg by mouth at bedtime.    [provider]  Calcium Carbonate-Vitamin D (CALTRATE 600+D PO) Take 1 tablet by mouth daily.    [provider]  carvedilol (COREG) 6.25 MG tablet Take 1 tablet (6.25 mg total) by mouth 2 (two) times daily. 07/24/21   Dorothyann Peng, MD  cephALEXin (KEFLEX) 250 MG capsule Take 1 capsule (250 mg total) by mouth 4 (four) times daily. 08/31/21   Mancel Bale, MD  diclofenac sodium (VOLTAREN) 1 % GEL APPLY TO AFFECTED AREA 3 TIMES DAILY AS NEEDED Patient taking differently: Apply 2 g topically 3 (three) times daily as needed (for pain). 06/16/18   Dorothyann Peng, MD  furosemide (LASIX) 40 MG tablet TAKE 1 TABLET(40 MG) BY MOUTH DAILY 07/24/21   Dorothyann Peng, MD  glucose blood test strip Test up to 4 times daily 09/17/18   Dorothyann Peng, MD  insulin degludec (TRESIBA FLEXTOUCH) 200 UNIT/ML FlexTouch Pen Inject 40 Units into the skin daily. INJECT 36 UNITS UNDER THE SKIN DAILY. Patient taking differently: Inject 40 Units into the skin daily. 03/27/21   Dorothyann Peng, MD  Insulin Pen Needle 32G X 4 MM MISC Use with insulin Dx code e11.65 12/07/19   Dorothyann Peng, MD  potassium chloride SA (KLOR-CON) 20 MEQ tablet Take 1 tablet (20 mEq total) by mouth once for 1 dose. Please d/c 2 rx for 2 times per day thanks Patient taking differently: Take 20 mEq by mouth daily. 04/12/20 07/14/22  [name], NP  rosuvastatin (CRESTOR) 10 MG tablet Take 1 tablet (10 mg total) by mouth See admin instructions. MONDAY-FRIDAY. DO NOT TAKE ON SATURDAY OR SUNDAY 06/25/21   Dorothyann Peng, MD  Semaglutide,0.25 or 0.5MG /DOS, (OZEMPIC, 0.25 OR 0.5 MG/DOSE,) 2 MG/1.5ML SOPN Inject 0.25 mg into the skin once a week. 08/12/21   Dorothyann Peng, MD  telmisartan (MICARDIS) 80 MG tablet TAKE 1 TABLET(80 MG) BY MOUTH DAILY 07/24/21   Dorothyann Peng, MD  UNABLE TO FIND lancets 30 gauge    [provider]  UNABLE TO FIND Comfort EZ Pen Needles 32 gauge x 5/32"    [provider]      Allergies    Tradjenta [linagliptin]    Review of Systems   Review of Systems  Physical Exam Updated Vital Signs BP (!) 131/103 (BP Location: Right Arm)   Pulse (!) 110   Temp 98.2 F (36.8 C) (Oral)  Resp (!) 23   SpO2 97%  Physical Exam Vitals and nursing note reviewed.  Constitutional:      Appearance: She is well-developed.     Comments: Appears tired   HENT:     Head: Normocephalic and atraumatic.     Right Ear: External ear normal.     Left Ear: External ear normal.  Eyes:     General: No scleral icterus.       Right eye: No discharge.        Left eye: No discharge.     Conjunctiva/sclera: Conjunctivae normal.  Neck:     Trachea: No tracheal deviation.  Cardiovascular:     Rate and Rhythm: Normal rate and regular rhythm.  Pulmonary:     Effort: Pulmonary effort is normal. No respiratory distress.     Breath sounds: Normal breath sounds. No stridor. No wheezing or rales.  Abdominal:     General: Bowel sounds are normal. There is no distension.      Palpations: Abdomen is soft.     Tenderness: There is no abdominal tenderness. There is no guarding or rebound.  Musculoskeletal:        General: No tenderness or deformity.     Cervical back: Neck supple.  Skin:    General: Skin is warm and dry.     Findings: No rash.  Neurological:     General: No focal deficit present.     Mental Status: She is alert.     Cranial Nerves: No cranial nerve deficit, dysarthria or facial asymmetry.     Sensory: No sensory deficit.     Motor: No abnormal muscle tone or seizure activity.     Coordination: Coordination normal.     Comments: Able to squeeze with both hands, able to lift legs off the bed   Psychiatric:        Mood and Affect: Mood normal.     ED Results / Procedures / Treatments   Labs (all labs ordered are listed, but only abnormal results are displayed) Labs Reviewed  CBC - Abnormal; Notable for the following components:      Result Value   WBC 13.6 (*)    RBC 3.38 (*)    Hemoglobin 8.2 (*)    HCT 26.7 (*)    MCV 79.0 (*)    MCH 24.3 (*)    RDW 18.8 (*)    Platelets 552 (*)    All other components within normal limits  BASIC METABOLIC PANEL - Abnormal; Notable for the following components:   Glucose, Bld 237 (*)    Creatinine, Ser 1.05 (*)    Calcium 8.8 (*)    GFR, Estimated 51 (*)    All other components within normal limits  I-STAT CG4 LACTIC ACID, ED - Abnormal; Notable for the following components:   Lactic Acid, Venous 2.9 (*)    All other components within normal limits  CBG MONITORING, ED - Abnormal; Notable for the following components:   Glucose-Capillary 59 (*)    All other components within normal limits  CBG MONITORING, ED - Abnormal; Notable for the following components:   Glucose-Capillary 63 (*)    All other components within normal limits  I-STAT CG4 LACTIC ACID, ED - Abnormal; Notable for the following components:   Lactic Acid, Venous 2.7 (*)    All other components within normal limits  CBG  MONITORING, ED - Abnormal; Notable for the following components:   Glucose-Capillary 182 (*)    All  other components within normal limits  CBG MONITORING, ED - Abnormal; Notable for the following components:   Glucose-Capillary 206 (*)    All other components within normal limits  CULTURE, BLOOD (ROUTINE X 2)  CULTURE, BLOOD (ROUTINE X 2)  URINALYSIS, ROUTINE W REFLEX MICROSCOPIC    EKG None  Radiology DG Chest Portable 1 View  Result Date: 01/16/2023 CLINICAL DATA:  Weakness EXAM: PORTABLE CHEST 1 VIEW COMPARISON:  07/13/2021 FINDINGS: The heart size and mediastinal contours are within normal limits. Aortic atherosclerosis. Low lung volumes. No focal airspace consolidation, pleural effusion, or pneumothorax. IMPRESSION: No active disease. Electronically Signed   By: Duanne Guess D.O.   On: 01/16/2023 21:10    Procedures Procedures    Medications Ordered in ED Medications  cefTRIAXone (ROCEPHIN) 1 g in sodium chloride 0.9 % 100 mL IVPB (has no administration in time range)  lactated ringers bolus 500 mL (500 mLs Intravenous New Bag/Given 01/16/23 1949)    ED Course/ Medical Decision Making/ A&P Clinical Course as of 01/16/23 2223  Fri Jan 16, 2023  1914 Patient's blood pressure has improved.  Now 131/103 [JK]  1951 CBC(!) White blood cell elevating compared to previous.  Initial lactic acid level elevated. [JK]  1952 No clear signs of sepsis.  Initial lactic acid level elevated.  Will give fluid bolus, check cultures [JK]  2156 Repeat lactic acid level decreased from 2.9-2.7 [JK]  2156 Chest x-ray without acute abnormality. [JK]  2157 No clear signs of infection but lactic still elevated.  Noted to have elevated wbc.  Will give empiric abx, check urine.  Plan on admission for observation [JK]  2222 Case discussed with Dr Antionette Char [JK]    Clinical Course User Index [JK] Linwood Dibbles, MD                                 Medical Decision Making Problems Addressed: Lactic  acidosis: acute illness or injury that poses a threat to life or bodily functions Weakness: acute illness or injury that poses a threat to life or bodily functions  Amount and/or Complexity of Data Reviewed Labs: ordered. Decision-making details documented in ED Course. Radiology: ordered and independent interpretation performed.  Risk Decision regarding hospitalization.   Patient presented to the ED for evaluation of low blood sugar confusion fatigue.  Patient was also mildly hypotensive initially.  Concerned about the possibility of hypoglycemia, dehydration anemia infection.  Patient afebrile here but was mildly hypotensive initially.  Patient was treated with IV fluids.  Screening labs for sepsis infection anemia were ordered.  Patient's blood count is stable.  She is anemic but unchanged compared to her baseline.  No significant electrolyte abnormalities however lactic acid level and leukocytosis noted.  No signs of infection at this time.  No pneumonia.  Will send off urine sample.  Blood cultures ordered.  Dose of antibiotics ordered empirically.  No signs of severe lactic acidosis or hypotension.  Will admit to the hospital for observation        Final Clinical Impression(s) / ED Diagnoses Final diagnoses:  Weakness  Lactic acidosis    Rx / DC Orders ED Discharge Orders     None         Linwood Dibbles, MD 01/16/23 2223

## 2023-01-16 NOTE — ED Notes (Signed)
CBG 59. PT WAS GIVEN A MEAL AND OJ

## 2023-01-16 NOTE — ED Notes (Signed)
The RN, Lead Nurse, and Charge Nurse has been made aware that the patient does not have cardiac orders, but are on the monitor. Currently waiting for either of the aforementioned staff to return call to confirm orders prior to moving the patient off the monitor.

## 2023-01-16 NOTE — ED Notes (Signed)
ED TO INPATIENT HANDOFF REPORT  ED Nurse Name and Phone #: Molli Hazard   MORE NOTES SCROLL DOWN Name/Age/Gender Helen Richardson 89 y.o. female Room/Bed: 004C/004C  Code Status   Code Status: Prior  Home/SNF/Other Home Patient oriented to: self, place, time, and situation Is this baseline? Yes   Triage Complete: Triage complete  Chief Complaint SIRS (systemic inflammatory response syndrome) (HCC) [R65.10]  Triage Note Pt BIB [facility] from home d/t AMS, fatigue & hypoglycemia. Her Dexcom was showing 61 & EMS reports they got 88 after juice was given PO. Pt is following commands, 12L good, 20g PIV in Rt AC. Is alert to person & place, confused to time & situation. BP was 98/58 & all other vss.   Allergies Allergies  Allergen Reactions   Tradjenta [Linagliptin] Swelling    Level of Care/Admitting Diagnosis ED Disposition     ED Disposition  Admit   Condition  --   Comment  Hospital Area: [facility] [[id number]]  Level of Care: Telemetry Medical [104]  May place patient in observation at [facility] or [facility] if equivalent level of care is available:: Yes  Covid Evaluation: Asymptomatic - no recent exposure (last 10 days) testing not required  Diagnosis: SIRS (systemic inflammatory response syndrome) [facility]) [[id number]]  Admitting Physician: Briscoe Deutscher [[id number]]  Attending Physician: Briscoe Deutscher [[id number]]          B Medical/Surgery History Past Medical History:  Diagnosis Date   CHF (congestive heart failure) (HCC)    Diabetes mellitus without complication (HCC)    High cholesterol    HTN (hypertension)    Obesity    Past Surgical History:  Procedure Laterality Date   CATARACT EXTRACTION, BILATERAL     ingrown toenail Left    left great toenail   PARTIAL HYSTERECTOMY       A IV Location/Drains/Wounds Patient Lines/Drains/Airways Status     Active Line/Drains/Airways     Name Placement date Placement time Site Days   Peripheral IV  01/16/23 20 G Anterior;Right Forearm 01/16/23  1600  Forearm  less than 1            Intake/Output Last 24 hours No intake or output data in the 24 hours ending 01/16/23 2328  Labs/Imaging Results for orders placed or performed during the hospital encounter of 01/16/23 (from the past 48 hour(s))  CBG monitoring, ED     Status: Abnormal   Collection Time: 01/16/23  5:04 PM  Result Value Ref Range   Glucose-Capillary 59 (L) 70 - 99 mg/dL    Comment: Glucose reference range applies only to samples taken after fasting for at least 8 hours.  CBG monitoring, ED     Status: Abnormal   Collection Time: 01/16/23  5:30 PM  Result Value Ref Range   Glucose-Capillary 63 (L) 70 - 99 mg/dL    Comment: Glucose reference range applies only to samples taken after fasting for at least 8 hours.  CBC     Status: Abnormal   Collection Time: 01/16/23  6:51 PM  Result Value Ref Range   WBC 13.6 (H) 4.0 - 10.5 K/uL   RBC 3.38 (L) 3.87 - 5.11 MIL/uL   Hemoglobin 8.2 (L) 12.0 - 15.0 g/dL   HCT 86.5 (L) 78.4 - 69.6 %   MCV 79.0 (L) 80.0 - 100.0 fL   MCH 24.3 (L) 26.0 - 34.0 pg   MCHC 30.7 30.0 - 36.0 g/dL   RDW 29.5 (H) 28.4 - 13.2 %  Platelets 552 (H) 150 - 400 K/uL   nRBC 0.0 0.0 - 0.2 %    Comment: Performed at [facility], [address] [place], [place] [address]  Basic metabolic panel     Status: Abnormal   Collection Time: 01/16/23  6:51 PM  Result Value Ref Range   Sodium 135 135 - 145 mmol/L   Potassium 5.0 3.5 - 5.1 mmol/L   Chloride 100 98 - 111 mmol/L   CO2 25 22 - 32 mmol/L   Glucose, Bld 237 (H) 70 - 99 mg/dL    Comment: Glucose reference range applies only to samples taken after fasting for at least 8 hours.   BUN 12 8 - 23 mg/dL   Creatinine, Ser 6.64 (H) 0.44 - 1.00 mg/dL   Calcium 8.8 (L) 8.9 - 10.3 mg/dL   GFR, Estimated 51 (L) >60 mL/min    Comment: (NOTE) Calculated using the CKD-EPI Creatinine Equation (2021)    Anion gap 10 5 - 15    Comment: Performed at  [facility], [address], [place], [place] [address]  I-Stat CG4 Lactic Acid     Status: Abnormal   Collection Time: 01/16/23  7:02 PM  Result Value Ref Range   Lactic Acid, Venous 2.9 (HH) 0.5 - 1.9 mmol/L   Comment NOTIFIED PHYSICIAN   CBG monitoring, ED     Status: Abnormal   Collection Time: 01/16/23  7:12 PM  Result Value Ref Range   Glucose-Capillary 182 (H) 70 - 99 mg/dL    Comment: Glucose reference range applies only to samples taken after fasting for at least 8 hours.  I-Stat CG4 Lactic Acid     Status: Abnormal   Collection Time: 01/16/23  9:33 PM  Result Value Ref Range   Lactic Acid, Venous 2.7 (HH) 0.5 - 1.9 mmol/L   Comment NOTIFIED PHYSICIAN   CBG monitoring, ED     Status: Abnormal   Collection Time: 01/16/23  9:56 PM  Result Value Ref Range   Glucose-Capillary 206 (H) 70 - 99 mg/dL    Comment: Glucose reference range applies only to samples taken after fasting for at least 8 hours.   DG Chest Portable 1 View  Result Date: 01/16/2023 CLINICAL DATA:  Weakness EXAM: PORTABLE CHEST 1 VIEW COMPARISON:  07/13/2021 FINDINGS: The heart size and mediastinal contours are within normal limits. Aortic atherosclerosis. Low lung volumes. No focal airspace consolidation, pleural effusion, or pneumothorax. IMPRESSION: No active disease. Electronically Signed   By: Duanne Guess D.O.   On: 01/16/2023 21:10    Pending Labs Unresulted Labs (From admission, onward)     Start     Ordered   01/16/23 2157  Urinalysis, Routine w reflex microscopic -Urine, Clean Catch  Once,   URGENT       Question:  Specimen Source  Answer:  Urine, Clean Catch   01/16/23 2157   01/16/23 1953  Blood culture (routine x 2)  BLOOD CULTURE X 2,   R (with STAT occurrences)      01/16/23 1952            Vitals/Pain Today's Vitals   01/16/23 1747 01/16/23 2152 01/16/23 2224 01/16/23 2242  BP: (!) 131/103  133/60   Pulse: (!) 110     Resp: 20 (!) 23 (!) 26   Temp: 98.2 F (36.8 C)   98.3 F (36.8 C)   TempSrc: Oral     SpO2: 97%     PainSc:  3  4     Isolation Precautions No active isolations  Medications Medications  lactated ringers bolus 500 mL (500 mLs Intravenous New Bag/Given 01/16/23 1949)  cefTRIAXone (ROCEPHIN) 1 g in sodium chloride 0.9 % 100 mL IVPB (0 g Intravenous Stopped 01/16/23 2309)    Mobility walks     Focused Assessments Cardiac Assessment Handoff:  Cardiac Rhythm: Sinus tachycardia Lab Results  Component Value Date   TROPONINI <0.03 06/10/2018   No results found for: "DDIMER" Does the Patient currently have chest pain? Yes   , Neuro Assessment Handoff:  Swallow screen pass? Yes  Cardiac Rhythm: Sinus tachycardia       Neuro Assessment:   Neuro Checks:      Has TPA been given?  If patient is a Neuro Trauma and patient is going to OR before floor call report to 4N Charge nurse: [phone] or [phone]  , Renal Assessment Handoff:  Hemodialysis Schedule:  Last Hemodialysis date and time:    Restricted appendage:   , Pulmonary Assessment Handoff:  Lung sounds:          R Recommendations: See Admitting Provider Note  Report given to:   Additional Notes:   Pt is pleasant and kind, with intermittent periods of agitation. Pt presents with mild forgetfulness/confusion, but is able to hold a conversation. Pts was mildly hypoglycemic and was improved with a meal. However, labs came back with elevated lactic. Pt is able to ambulate with assistance to restroom. Pt is presenting resting comfortably. Meds are as noted.

## 2023-01-17 ENCOUNTER — Observation Stay (HOSPITAL_COMMUNITY): Payer: Medicare PPO

## 2023-01-17 DIAGNOSIS — R569 Unspecified convulsions: Secondary | ICD-10-CM

## 2023-01-17 DIAGNOSIS — G9340 Encephalopathy, unspecified: Secondary | ICD-10-CM | POA: Diagnosis not present

## 2023-01-17 DIAGNOSIS — D509 Iron deficiency anemia, unspecified: Secondary | ICD-10-CM | POA: Diagnosis present

## 2023-01-17 LAB — AMMONIA: Ammonia: 21 umol/L (ref 9–35)

## 2023-01-17 LAB — URINALYSIS, ROUTINE W REFLEX MICROSCOPIC
Bilirubin Urine: NEGATIVE
Glucose, UA: NEGATIVE mg/dL
Hgb urine dipstick: NEGATIVE
Ketones, ur: NEGATIVE mg/dL
Nitrite: NEGATIVE
Protein, ur: NEGATIVE mg/dL
Specific Gravity, Urine: 1.011 (ref 1.005–1.030)
WBC, UA: >50 WBC/hpf (ref 0–5)
pH: 7 (ref 5.0–8.0)

## 2023-01-17 LAB — BASIC METABOLIC PANEL
Anion gap: 8 (ref 5–15)
BUN: 11 mg/dL (ref 8–23)
CO2: 25 mmol/L (ref 22–32)
Calcium: 8.7 mg/dL — ABNORMAL LOW (ref 8.9–10.3)
Chloride: 101 mmol/L (ref 98–111)
Creatinine, Ser: 0.89 mg/dL (ref 0.44–1.00)
GFR, Estimated: >60 mL/min (ref >60)
Glucose, Bld: 166 mg/dL — ABNORMAL HIGH (ref 70–99)
Potassium: 4.5 mmol/L (ref 3.5–5.1)
Sodium: 134 mmol/L — ABNORMAL LOW (ref 135–145)

## 2023-01-17 LAB — CBC
HCT: 25.3 % — ABNORMAL LOW (ref 36.0–46.0)
Hemoglobin: 7.7 g/dL — ABNORMAL LOW (ref 12.0–15.0)
MCH: 24.1 pg — ABNORMAL LOW (ref 26.0–34.0)
MCHC: 30.4 g/dL (ref 30.0–36.0)
MCV: 79.1 fL — ABNORMAL LOW (ref 80.0–100.0)
Platelets: 563 10*3/uL — ABNORMAL HIGH (ref 150–400)
RBC: 3.2 MIL/uL — ABNORMAL LOW (ref 3.87–5.11)
RDW: 18.9 % — ABNORMAL HIGH (ref 11.5–15.5)
WBC: 12.8 10*3/uL — ABNORMAL HIGH (ref 4.0–10.5)
nRBC: 0 % (ref 0.0–0.2)

## 2023-01-17 LAB — LACTIC ACID, PLASMA: Lactic Acid, Venous: 1.9 mmol/L (ref 0.5–1.9)

## 2023-01-17 LAB — IRON AND TIBC
Iron: 6 ug/dL — ABNORMAL LOW (ref 28–170)
Saturation Ratios: 2 % — ABNORMAL LOW (ref 10.4–31.8)
TIBC: 277 ug/dL (ref 250–450)
UIBC: 271 ug/dL

## 2023-01-17 LAB — MAGNESIUM: Magnesium: 2 mg/dL (ref 1.7–2.4)

## 2023-01-17 LAB — GLUCOSE, CAPILLARY
Glucose-Capillary: 157 mg/dL — ABNORMAL HIGH (ref 70–99)
Glucose-Capillary: 310 mg/dL — ABNORMAL HIGH (ref 70–99)

## 2023-01-17 LAB — HEMOGLOBIN A1C
Hgb A1c MFr Bld: 6.1 % — ABNORMAL HIGH (ref 4.8–5.6)
Mean Plasma Glucose: 128.37 mg/dL

## 2023-01-17 LAB — CBG MONITORING, ED
Glucose-Capillary: 169 mg/dL — ABNORMAL HIGH (ref 70–99)
Glucose-Capillary: 142 mg/dL — ABNORMAL HIGH (ref 70–99)

## 2023-01-17 LAB — TSH: TSH: 0.563 u[IU]/mL (ref 0.350–4.500)

## 2023-01-17 LAB — PHOSPHORUS: Phosphorus: 2.8 mg/dL (ref 2.5–4.6)

## 2023-01-17 MED ORDER — ACETAMINOPHEN 650 MG RE SUPP: 650.0000 mg | Freq: Four times a day (QID) | RECTAL | Status: DC | PRN

## 2023-01-17 MED ORDER — ACETAMINOPHEN 325 MG PO TABS: 650.0000 mg | ORAL_TABLET | Freq: Four times a day (QID) | ORAL | Status: DC | PRN

## 2023-01-17 MED ORDER — PANTOPRAZOLE SODIUM 40 MG PO TBEC
40.0000 mg | DELAYED_RELEASE_TABLET | Freq: Every day | ORAL | Status: DC
  Administered 2023-01-17 – 2023-01-20 (×4): 40 mg via ORAL
  Filled 2023-01-17 (×4): qty 1

## 2023-01-17 MED ORDER — INSULIN ASPART 100 UNIT/ML IJ SOLN
0.0000 [IU] | Freq: Three times a day (TID) | INTRAMUSCULAR | Status: DC
  Administered 2023-01-17: 1 [IU] via SUBCUTANEOUS

## 2023-01-17 MED ORDER — ONDANSETRON HCL 4 MG PO TABS: 4.0000 mg | ORAL_TABLET | Freq: Four times a day (QID) | ORAL | Status: DC | PRN

## 2023-01-17 MED ORDER — ASPIRIN 81 MG PO TBEC
81.0000 mg | DELAYED_RELEASE_TABLET | Freq: Every day | ORAL | Status: DC
  Administered 2023-01-17 – 2023-01-18 (×2): 81 mg via ORAL
  Filled 2023-01-17 (×2): qty 1

## 2023-01-17 MED ORDER — CARVEDILOL 6.25 MG PO TABS
6.2500 mg | ORAL_TABLET | Freq: Two times a day (BID) | ORAL | Status: DC
  Administered 2023-01-17 – 2023-01-18 (×3): 6.25 mg via ORAL
  Filled 2023-01-17 (×2): qty 1
  Filled 2023-01-17: qty 2

## 2023-01-17 MED ORDER — ENOXAPARIN SODIUM 40 MG/0.4ML IJ SOSY
40.0000 mg | PREFILLED_SYRINGE | INTRAMUSCULAR | Status: DC
  Administered 2023-01-17 – 2023-01-20 (×4): 40 mg via SUBCUTANEOUS
  Filled 2023-01-17 (×4): qty 0.4

## 2023-01-17 MED ORDER — IRBESARTAN 300 MG PO TABS
300.0000 mg | ORAL_TABLET | Freq: Every day | ORAL | Status: DC
  Administered 2023-01-17 – 2023-01-18 (×2): 300 mg via ORAL
  Filled 2023-01-17 (×2): qty 1

## 2023-01-17 MED ORDER — SODIUM CHLORIDE 0.9% FLUSH
3.0000 mL | Freq: Two times a day (BID) | INTRAVENOUS | Status: DC
  Administered 2023-01-17 – 2023-01-20 (×6): 3 mL via INTRAVENOUS

## 2023-01-17 MED ORDER — INSULIN ASPART 100 UNIT/ML IJ SOLN
0.0000 [IU] | Freq: Every day | INTRAMUSCULAR | Status: DC
  Administered 2023-01-17: 4 [IU] via SUBCUTANEOUS

## 2023-01-17 MED ORDER — ONDANSETRON HCL 4 MG/2ML IJ SOLN: 4.0000 mg | Freq: Four times a day (QID) | INTRAMUSCULAR | Status: DC | PRN

## 2023-01-17 MED ORDER — SENNOSIDES-DOCUSATE SODIUM 8.6-50 MG PO TABS: 1.0000 | ORAL_TABLET | Freq: Every evening | ORAL | Status: DC | PRN

## 2023-01-17 NOTE — Evaluation (Signed)
Occupational Therapy Evaluation Patient Details Name: Helen Richardson MRN: [id number] DOB: 22-Oct-1933 Today's Date: 01/17/2023   History of Present Illness Pt is an 89 y/o F presenting to ED on 11/15 from home with AMS, fatigue, and hypoglycemia, admitted for acute encephalopathy. PMH includes DM, CHF, HTN, high cholesterol.   Clinical Impression   Pt reports having assist from aide for ADLs, uses RW for mobility. Pt currently with decr cognition oriented to self and place, needing set up -mod A for ADLs, CGA for bed mobility and min A for transfers with RW. Pt presenting with impairments listed below, will follow acutely. Recommend HHOT at d/c given pt has 24/7 assist from aide, if not will benefit from postacute rehab at d/c.       If plan is discharge home, recommend the following: A little help with walking and/or transfers;A lot of help with bathing/dressing/bathroom;Assistance with cooking/housework;Direct supervision/assist for medications management;Direct supervision/assist for financial management;Assist for transportation;Help with stairs or ramp for entrance;Supervision due to cognitive status    Functional Status Assessment  Patient has had a recent decline in their functional status and demonstrates the ability to make significant improvements in function in a reasonable and predictable amount of time.  Equipment Recommendations  Tub/shower seat    Recommendations for Other Services PT consult     Precautions / Restrictions Precautions Precautions: Fall Restrictions Weight Bearing Restrictions: No      Mobility Bed Mobility Overal bed mobility: Needs Assistance Bed Mobility: Supine to Sit     Supine to sit: Contact guard     General bed mobility comments: incr time and use of rails    Transfers Overall transfer level: Needs assistance Equipment used: Rolling walker (2 wheels) Transfers: Sit to/from Stand Sit to Stand: Min assist                   Balance Overall balance assessment: Needs assistance Sitting-balance support: Feet supported Sitting balance-[name]: Good     Standing balance support: During functional activity, Reliant on assistive device for balance Standing balance-[name]: Poor                             ADL either performed or assessed with clinical judgement   ADL Overall ADL's : Needs assistance/impaired Eating/Feeding: Set up   Grooming: Minimal assistance   Upper Body Bathing: Moderate assistance   Lower Body Bathing: Moderate assistance   Upper Body Dressing : Moderate assistance   Lower Body Dressing: Moderate assistance   Toilet Transfer: Minimal assistance;Ambulation;Regular Toilet;Rolling walker (2 wheels)   Toileting- Clothing Manipulation and Hygiene: Supervision/safety       Functional mobility during ADLs: Minimal assistance;Rolling walker (2 wheels)       Vision   Vision Assessment?: No apparent visual deficits     Perception Perception: Not tested       Praxis Praxis: Not tested       Pertinent Vitals/Pain Pain Assessment Pain Assessment: No/denies pain     Extremity/Trunk Assessment Upper Extremity Assessment Upper Extremity Assessment: Generalized weakness   Lower Extremity Assessment Lower Extremity Assessment: Defer to PT evaluation   Cervical / Trunk Assessment Cervical / Trunk Assessment: Normal   Communication Communication Communication: No apparent difficulties   Cognition Arousal: Alert Behavior During Therapy: WFL for tasks assessed/performed Overall Cognitive Status: No family/caregiver present to determine baseline cognitive functioning  General Comments: disoriented to date and time, aware she is at the hospital. follows commands with incr time     General Comments  VSS    Exercises     Shoulder Instructions      Home Living Family/patient expects to be discharged to::  Private residence Living Arrangements: Alone Available Help at Discharge: Available PRN/intermittently;Personal care attendant (reports aide "Selena Batten" comes to house daily, from AM to PM, reports is there overnight, but will need to confirm) Type of Home: House Home Access: Level entry     Home Layout: One level     Bathroom Shower/Tub: Chief Strategy Officer: Standard         Additional Comments: some info taken from prior admission as pt quesitonable historian, confirmed with RN that pt is from home with 24/7 assist from PCA      Prior Functioning/Environment Prior Level of Function : Needs assist             Mobility Comments: uses RW ADLs Comments: reports has assist for bathing and meals all ADLs, reports her daughter in law "[name]" takes her to dr appts        OT Problem List: Decreased strength;Decreased activity tolerance;Decreased range of motion;Impaired balance (sitting and/or standing);Decreased safety awareness;Decreased cognition      OT Treatment/Interventions: Self-care/ADL training    OT Goals(Current goals can be found in the care plan section) Acute Rehab OT Goals Patient Stated Goal: none stated OT Goal Formulation: With patient Time For Goal Achievement: 01/31/23 Potential to Achieve Goals: Good ADL Goals Pt Will Perform Upper Body Dressing: with supervision;sitting Pt Will Perform Lower Body Dressing: with supervision;sitting/lateral leans Pt Will Transfer to Toilet: with supervision;ambulating;regular height toilet Pt Will Perform Tub/Shower Transfer: with supervision;Shower transfer;Tub transfer;ambulating;shower seat Additional ADL Goal #1: pt will be able to stand x5 min for functional task in order ot improve activity tolerance for ADLs  OT Frequency: Min 1X/week    Co-evaluation              AM-PAC OT "6 Clicks" Daily Activity     Outcome Measure Help from another person eating meals?: None Help from another person  taking care of personal grooming?: A Little Help from another person toileting, which includes using toliet, bedpan, or urinal?: A Little Help from another person bathing (including washing, rinsing, drying)?: A Lot Help from another person to put on and taking off regular upper body clothing?: A Little Help from another person to put on and taking off regular lower body clothing?: A Lot 6 Click Score: 17   End of Session Equipment Utilized During Treatment: Rolling walker (2 wheels);Gait belt Nurse Communication: Mobility status  Activity Tolerance: Patient tolerated treatment well Patient left: with call bell/phone within reach;in chair;with chair alarm set  OT Visit Diagnosis: Unsteadiness on feet (R26.81);Other abnormalities of gait and mobility (R26.89);Muscle weakness (generalized) (M62.81)                Time: 9604-5409 OT Time Calculation (min): 25 min Charges:  OT General Charges $OT Visit: 1 Visit OT Evaluation $OT Eval Moderate Complexity: 1 Mod OT Treatments $Self Care/Home Management : 8-22 mins  Carver Fila, OTD, OTR/L SecureChat Preferred Acute Rehab [phone]   Carver Fila Koonce 01/17/2023, 3:14 PM

## 2023-01-17 NOTE — Progress Notes (Signed)
EEG complete - results pending 

## 2023-01-17 NOTE — Progress Notes (Addendum)
N   Jamilah Jean Acquanetta Chain, RN NEW ADMISSION NOTE New Admission Note:   Arrival Method:  Stretcher Mental Orientation:  X 2 Telemetry: box 15 Assessment: Completed Skin: intact IV: flushed Pain: denies Tubes: none present Safety Measures: Safety Fall Prevention Plan has been given, discussed and signed Admission: Completed [facility] Orientation: Patient has been orientated to the room, unit and staff.  Family:  talked to grandaughter and caregiver  Orders have been reviewed and implemented. Will continue to monitor the patient. Call light has been placed within reach and bed alarm has been activated.   Velia Meyer, RN

## 2023-01-17 NOTE — Procedures (Signed)
Patient Name: Helen Richardson  MRN: [id number]  Epilepsy Attending: Charlsie Quest  Referring Physician/Provider: Briscoe Deutscher, MD  Date: 01/17/2023 Duration: 24.59 mins  Patient history: 89yo F with ams getting eeg to evaluate for seizure  Level of alertness: Awake  AEDs during EEG study: None  Technical aspects: This EEG study was done with scalp electrodes positioned according to the 10-20 International system of electrode placement. Electrical activity was reviewed with band pass filter of 1-70Hz , sensitivity of 7 uV/mm, display speed of 34mm/sec with a 60Hz  notched filter applied as appropriate. EEG data were recorded continuously and digitally stored.  Video monitoring was available and reviewed as appropriate.  Description: The posterior dominant rhythm consists of 8 Hz activity of moderate voltage (25-35 uV) seen predominantly in posterior head regions, symmetric and reactive to eye opening and eye closing. EEG showed intermittent generalized 3 to 6 Hz theta-delta slowing. Hyperventilation and photic stimulation were not performed.     Of note, study was technically difficult due to significant myogenic artifact.  ABNORMALITY - Intermittent slow, generalized  IMPRESSION: This technically difficult study is suggestive of mild severe diffuse encephalopathy. No seizures or epileptiform discharges were seen throughout the recording.  Aldine Chakraborty Annabelle Harman

## 2023-01-17 NOTE — Progress Notes (Signed)
PROGRESS NOTE  Helen DUNNAWAY  NWG:[id number] DOB: 1933-12-03 DOA: 01/16/2023 PCP: Dorothyann Peng, MD   Brief Narrative: Patient is a 89 year old female with history of hypertension, hyperlipidemia, diastolic CHF, chronic normocytic anemia, CKD stage III, insulin-dependent diabetes type 2 who presented with altered mental status from home.  She was confused on presentation.  She was found to be unresponsive while lying on the recliner, lethargic, disoriented on EMS arrival.  Blood sugars are noticed to be low in the range of 57.  On presentation, she was hemodynamically stable.  Lab work showed WBC count of 13.6, hemoglobin of 8.2, lactate of 2.9.  Chest x-ray did not show pneumonia.  Patient was admitted for the further evaluation of altered mental status.  CT head did not show any acute findings but showed atrophy, small-vessel disease and chronic lacunar infarcts.  EEG did not show any seizure however able to from discharge.  Currently she is alert and oriented.  Pending PT/OT evaluation  Assessment & Plan:  Principal Problem:   Acute encephalopathy Active Problems:   Type 2 diabetes mellitus with stage 3 chronic kidney disease, with long-term current use of insulin (HCC)   HTN (hypertension)   CKD stage 3a, GFR 45-59 ml/min (HCC)   SIRS (systemic inflammatory response syndrome) (HCC)   Chronic heart failure with preserved ejection fraction (HFpEF) (HCC)   Microcytic anemia  Acute encephalopathy: Unclear etiology.  Found to be confused, lethargic at home.  Hemodynamically stable. CT head did not show any acute findings but showed atrophy, small-vessel disease and chronic lacunar infarcts.  EEG did not show any seizure or epileptiform discharge.   Ammonia level normal. Encephalopathy has resolved.  Currently she is alert and oriented.  Follows commands very well.  PT/OT consulted.  SIRS: Presented with leukocytosis, tachycardia, lactic acidosis elevation.  Continue gentle IV fluid.  Monitor  off antibiotics.  Afebrile.  Chest x-ray did not show pneumonia.  UA not suspicious for UTI.  Leukocytosis improved  Insulin-dependent type 2 diabetes/hypoglycemia: Mild hypoglycemic when EMS arrived.  Currently on sliding scale.  A1c of 6.1  CKD stage IIIa: Currently kidney at baseline  Diastolic CHF: Currently appears euvolemic.  Lasix on hold.  Given IV fluid in the emergency department  Hypertension: On ARB and beta-blocker  Normocytic anemia:   No evidence of acute blood loss.  Hemoglobin in the range of 7 today.  Hemoglobin was in the range of 10 about a month ago.  Started on Protonix.  Patient was on aspirin at home.  Will check FOBT, will check iron panel          DVT prophylaxis:enoxaparin (LOVENOX) injection 40 mg Start: 01/17/23 1000     Code Status: Full Code  Family Communication: called and discussed with daughter in law [place] on phone on 11/16  Patient status:Lovenox  Patient is from :Home  Anticipated discharge VH:QION vs SnF  Estimated DC date:1-2 days   Consultants: None  Procedures:None  Antimicrobials:  Anti-infectives (From admission, onward)    Start     Dose/Rate Route Frequency Ordered Stop   01/16/23 2200  cefTRIAXone (ROCEPHIN) 1 g in sodium chloride 0.9 % 100 mL IVPB        1 g 200 mL/hr over 30 Minutes Intravenous  Once 01/16/23 2157 01/16/23 2309       Subjective: Patient seen and examined at bedside today.  During my evaluation, she was hemodynamically stable.  Comfortable.  She is completely alert and oriented this morning.  Denies any focal  weakness.  Speech is clear.  Patient says she lives alone and has sons.  Ambulates with the help of walker  Objective: Vitals:   01/17/23 0600 01/17/23 0634 01/17/23 0635 01/17/23 0637  BP:  (!) 134/110    Pulse: 90 (!) 102 97   Resp: 16 17 19    Temp:    98.3 F (36.8 C)  TempSrc:    Oral  SpO2: 100% 100% 98%    No intake or output data in the 24 hours ending 01/17/23 0745 There  were no vitals filed for this visit.  Examination:  General exam: Overall comfortable, not in distress, pleasant elderly female HEENT: PERRL Respiratory system:  no wheezes or crackles  Cardiovascular system: S1 & S2 heard, RRR.  Gastrointestinal system: Abdomen is nondistended, soft and nontender. Central nervous system: Alert and oriented Extremities: No edema, no clubbing ,no cyanosis Skin: No rashes, no ulcers,no icterus     Data Reviewed: I have personally reviewed following labs and imaging studies  CBC: Recent Labs  Lab 01/16/23 1851  WBC 13.6*  HGB 8.2*  HCT 26.7*  MCV 79.0*  PLT 552*   Basic Metabolic Panel: Recent Labs  Lab 01/16/23 1851 01/17/23 0516  NA 135 134*  K 5.0 4.5  CL 100 101  CO2 25 25  GLUCOSE 237* 166*  BUN 12 11  CREATININE 1.05* 0.89  CALCIUM 8.8* 8.7*  MG  --  2.0  PHOS  --  2.8     No results found for this or any previous visit (from the past 240 hour(s)).   Radiology Studies: CT HEAD WO CONTRAST ( )  Result Date: 01/17/2023 CLINICAL DATA:  Sundowning and confusion. Mental status changes, unknown cause. EXAM: CT HEAD WITHOUT CONTRAST TECHNIQUE: Contiguous axial images were obtained from the base of the skull through the vertex without intravenous contrast. RADIATION DOSE REDUCTION: This exam was performed according to the departmental dose-optimization program which includes automated exposure control, adjustment of the mA and/or kV according to patient size and/or use of iterative reconstruction technique. COMPARISON:  None Available. FINDINGS: Brain: There is mild-to-moderate cerebral atrophy, small-vessel disease and atrophic ventriculomegaly, with no the midline shift. The cerebellum and brainstem are normal in volume and attenuation except for a subcentimeter chronic lacunar infarct in the superior right cerebellar hemisphere. There are few bilateral tiny chronic gangliocapsular lacunar infarcts. Scattered benign dural  calcifications along the falx. No asymmetry is seen worrisome for an acute cortical based infarct, hemorrhage, mass or mass effect. Basal cisterns are clear. Vascular: There are patchy calcifications in the carotid siphons. No hyperdense central vessel is seen. Skull: Negative for fractures or focal lesions. Sinuses/Orbits: Evidence of prior lens replacements. Otherwise negative orbits. There is mild membrane disease in the ethmoid air cells. Other paranasal sinuses, visualized mastoid air cells, and middle ear cavities are clear. Nasal septum deviates slightly to the left. Other: None. IMPRESSION: 1. No acute intracranial CT findings. 2. Atrophy, small-vessel disease and chronic lacunar infarcts. 3. Carotid atherosclerosis. 4. Mild ethmoid membrane disease. Electronically Signed   By: Almira Bar M.D.   On: 01/17/2023 01:51   DG Chest Portable 1 View  Result Date: 01/16/2023 CLINICAL DATA:  Weakness EXAM: PORTABLE CHEST 1 VIEW COMPARISON:  07/13/2021 FINDINGS: The heart size and mediastinal contours are within normal limits. Aortic atherosclerosis. Low lung volumes. No focal airspace consolidation, pleural effusion, or pneumothorax. IMPRESSION: No active disease. Electronically Signed   By: Duanne Guess D.O.   On: 01/16/2023 21:10    Scheduled  Meds:  aspirin EC  81 mg Oral Daily   carvedilol  6.25 mg Oral BID   enoxaparin (LOVENOX) injection  40 mg Subcutaneous Q24H   insulin aspart  0-5 Units Subcutaneous QHS   insulin aspart  0-6 Units Subcutaneous TID WC   irbesartan  300 mg Oral Daily   sodium chloride flush  3 mL Intravenous Q12H   Continuous Infusions:   LOS: 0 days   Burnadette Pop, MD [facility] P11/16/2024, 7:45 AM

## 2023-01-17 NOTE — ED Notes (Signed)
ED TO INPATIENT HANDOFF REPORT  ED Nurse Name and Phone #: Beatris Ship RN [phone]  S Name/Age/Gender Helen Richardson 89 y.o. female Room/Bed: 004C/004C  Code Status   Code Status: Full Code  Home/SNF/Other Home Patient oriented to: self and place Is this baseline? No   Triage Complete: Triage complete  Chief Complaint SIRS (systemic inflammatory response syndrome) (HCC) [R65.10]  Triage Note Pt BIB [facility] from home d/t AMS, fatigue & hypoglycemia. Her Dexcom was showing 5 & EMS reports they got 88 after juice was given PO. Pt is following commands, 12L good, 20g PIV in Rt AC. Is alert to person & place, confused to time & situation. BP was 98/58 & all other vss.   Allergies Allergies  Allergen Reactions   Tradjenta [Linagliptin] Swelling    Level of Care/Admitting Diagnosis ED Disposition     ED Disposition  Admit   Condition  --   Comment  Hospital Area: [facility] [[id number]]  Level of Care: Telemetry Medical [[id number]]  May place patient in observation at [facility] or [facility] if equivalent level of care is available:: Yes  Covid Evaluation: Asymptomatic - no recent exposure (last 10 days) testing not required  Diagnosis: SIRS (systemic inflammatory response syndrome) [facility]) [[id number]]  Admitting Physician: Briscoe Deutscher [[id number]]  Attending Physician: Briscoe Deutscher [[id number]]          B Medical/Surgery History Past Medical History:  Diagnosis Date   CHF (congestive heart failure) (HCC)    Diabetes mellitus without complication (HCC)    High cholesterol    HTN (hypertension)    Obesity    Past Surgical History:  Procedure Laterality Date   CATARACT EXTRACTION, BILATERAL     ingrown toenail Left    left great toenail   PARTIAL HYSTERECTOMY       A IV Location/Drains/Wounds Patient Lines/Drains/Airways Status     Active Line/Drains/Airways     Name Placement date Placement time Site Days   Peripheral IV 01/16/23 20 G  Anterior;Right Forearm 01/16/23  1600  Forearm  1            Intake/Output Last 24 hours No intake or output data in the 24 hours ending 01/17/23 0919  Labs/Imaging Results for orders placed or performed during the hospital encounter of 01/16/23 (from the past 48 hour(s))  CBG monitoring, ED     Status: Abnormal   Collection Time: 01/16/23  5:04 PM  Result Value Ref Range   Glucose-Capillary 59 (L) 70 - 99 mg/dL    Comment: Glucose reference range applies only to samples taken after fasting for at least 8 hours.  CBG monitoring, ED     Status: Abnormal   Collection Time: 01/16/23  5:30 PM  Result Value Ref Range   Glucose-Capillary 63 (L) 70 - 99 mg/dL    Comment: Glucose reference range applies only to samples taken after fasting for at least 8 hours.  CBC     Status: Abnormal   Collection Time: 01/16/23  6:51 PM  Result Value Ref Range   WBC 13.6 (H) 4.0 - 10.5 K/uL   RBC 3.38 (L) 3.87 - 5.11 MIL/uL   Hemoglobin 8.2 (L) 12.0 - 15.0 g/dL   HCT 59.5 (L) 63.8 - 75.6 %   MCV 79.0 (L) 80.0 - 100.0 fL   MCH 24.3 (L) 26.0 - 34.0 pg   MCHC 30.7 30.0 - 36.0 g/dL   RDW 43.3 (H) 29.5 - 18.8 %   Platelets  552 (H) 150 - 400 K/uL   nRBC 0.0 0.0 - 0.2 %    Comment: Performed at [facility], [address], [place], [place] [address]  Basic metabolic panel     Status: Abnormal   Collection Time: 01/16/23  6:51 PM  Result Value Ref Range   Sodium 135 135 - 145 mmol/L   Potassium 5.0 3.5 - 5.1 mmol/L   Chloride 100 98 - 111 mmol/L   CO2 25 22 - 32 mmol/L   Glucose, Bld 237 (H) 70 - 99 mg/dL    Comment: Glucose reference range applies only to samples taken after fasting for at least 8 hours.   BUN 12 8 - 23 mg/dL   Creatinine, Ser 4.78 (H) 0.44 - 1.00 mg/dL   Calcium 8.8 (L) 8.9 - 10.3 mg/dL   GFR, Estimated 51 (L) >60 mL/min    Comment: (NOTE) Calculated using the CKD-EPI Creatinine Equation (2021)    Anion gap 10 5 - 15    Comment: Performed at [facility],  [address] [place], [place] [address]  I-Stat CG4 Lactic Acid     Status: Abnormal   Collection Time: 01/16/23  7:02 PM  Result Value Ref Range   Lactic Acid, Venous 2.9 (HH) 0.5 - 1.9 mmol/L   Comment NOTIFIED PHYSICIAN   CBG monitoring, ED     Status: Abnormal   Collection Time: 01/16/23  7:12 PM  Result Value Ref Range   Glucose-Capillary 182 (H) 70 - 99 mg/dL    Comment: Glucose reference range applies only to samples taken after fasting for at least 8 hours.  I-Stat CG4 Lactic Acid     Status: Abnormal   Collection Time: 01/16/23  9:33 PM  Result Value Ref Range   Lactic Acid, Venous 2.7 (HH) 0.5 - 1.9 mmol/L   Comment NOTIFIED PHYSICIAN   CBG monitoring, ED     Status: Abnormal   Collection Time: 01/16/23  9:56 PM  Result Value Ref Range   Glucose-Capillary 206 (H) 70 - 99 mg/dL    Comment: Glucose reference range applies only to samples taken after fasting for at least 8 hours.  CBG monitoring, ED     Status: Abnormal   Collection Time: 01/17/23 12:59 AM  Result Value Ref Range   Glucose-Capillary 169 (H) 70 - 99 mg/dL    Comment: Glucose reference range applies only to samples taken after fasting for at least 8 hours.  Hemoglobin A1c     Status: Abnormal   Collection Time: 01/17/23  5:16 AM  Result Value Ref Range   Hgb A1c MFr Bld 6.1 (H) 4.8 - 5.6 %    Comment: (NOTE) Pre diabetes:          5.7%-6.4%  Diabetes:              >6.4%  Glycemic control for   <7.0% adults with diabetes    Mean Plasma Glucose 128.37 mg/dL    Comment: Performed at [facility], [address], [place], [place] [address]  Basic metabolic panel     Status: Abnormal   Collection Time: 01/17/23  5:16 AM  Result Value Ref Range   Sodium 134 (L) 135 - 145 mmol/L   Potassium 4.5 3.5 - 5.1 mmol/L   Chloride 101 98 - 111 mmol/L   CO2 25 22 - 32 mmol/L   Glucose, Bld 166 (H) 70 - 99 mg/dL    Comment: Glucose reference range applies only  to samples taken after fasting for at least 8  hours.   BUN 11 8 - 23 mg/dL   Creatinine, Ser 9.62 0.44 - 1.00 mg/dL   Calcium 8.7 (L) 8.9 - 10.3 mg/dL   GFR, Estimated >95 >28 mL/min    Comment: (NOTE) Calculated using the CKD-EPI Creatinine Equation (2021)    Anion gap 8 5 - 15    Comment: Performed at [facility], [address], [place], [place] [address]  Ammonia     Status: None   Collection Time: 01/17/23  5:16 AM  Result Value Ref Range   Ammonia 21 9 - 35 umol/L    Comment: Performed at [facility], [address] [place], [place] [address]  TSH     Status: None   Collection Time: 01/17/23  5:16 AM  Result Value Ref Range   TSH 0.563 0.350 - 4.500 uIU/mL    Comment: Performed by a 3rd Generation assay with a functional sensitivity of <=0.01 uIU/mL. Performed at [facility], [address] [place], [place] [address]   Magnesium     Status: None   Collection Time: 01/17/23  5:16 AM  Result Value Ref Range   Magnesium 2.0 1.7 - 2.4 mg/dL    Comment: Performed at [facility], [address], [place], [place] [address]  Phosphorus     Status: None   Collection Time: 01/17/23  5:16 AM  Result Value Ref Range   Phosphorus 2.8 2.5 - 4.6 mg/dL    Comment: Performed at [facility], [address] [place], [place] [address]  Lactic acid, plasma     Status: None   Collection Time: 01/17/23  5:16 AM  Result Value Ref Range   Lactic Acid, Venous 1.9 0.5 - 1.9 mmol/L    Comment: Performed at [facility], [address] [place], [place] [address]  CBG monitoring, ED     Status: Abnormal   Collection Time: 01/17/23  7:23 AM  Result Value Ref Range   Glucose-Capillary 142 (H) 70 - 99 mg/dL    Comment: Glucose reference range applies only to samples taken after fasting for at least 8 hours.   CT HEAD WO CONTRAST ( )  Result Date: 01/17/2023 CLINICAL DATA:  Sundowning and confusion. Mental status changes, unknown cause. EXAM: CT HEAD WITHOUT CONTRAST TECHNIQUE: Contiguous axial  images were obtained from the base of the skull through the vertex without intravenous contrast. RADIATION DOSE REDUCTION: This exam was performed according to the departmental dose-optimization program which includes automated exposure control, adjustment of the mA and/or kV according to patient size and/or use of iterative reconstruction technique. COMPARISON:  None Available. FINDINGS: Brain: There is mild-to-moderate cerebral atrophy, small-vessel disease and atrophic ventriculomegaly, with no the midline shift. The cerebellum and brainstem are normal in volume and attenuation except for a subcentimeter chronic lacunar infarct in the superior right cerebellar hemisphere. There are few bilateral tiny chronic gangliocapsular lacunar infarcts. Scattered benign dural calcifications along the falx. No asymmetry is seen worrisome for an acute cortical based infarct, hemorrhage, mass or mass effect. Basal cisterns are clear. Vascular: There are patchy calcifications in the carotid siphons. No hyperdense central vessel is seen. Skull: Negative for fractures or focal lesions. Sinuses/Orbits: Evidence of prior lens replacements. Otherwise negative orbits. There is mild membrane disease in the ethmoid air cells. Other paranasal sinuses, visualized mastoid air cells, and middle ear cavities are clear. Nasal septum deviates slightly to  the left. Other: None. IMPRESSION: 1. No acute intracranial CT findings. 2. Atrophy, small-vessel disease and chronic lacunar infarcts. 3. Carotid atherosclerosis. 4. Mild ethmoid membrane disease. Electronically Signed   By: Almira Bar M.D.   On: 01/17/2023 01:51   DG Chest Portable 1 View  Result Date: 01/16/2023 CLINICAL DATA:  Weakness EXAM: PORTABLE CHEST 1 VIEW COMPARISON:  07/13/2021 FINDINGS: The heart size and mediastinal contours are within normal limits. Aortic atherosclerosis. Low lung volumes. No focal airspace consolidation, pleural effusion, or pneumothorax. IMPRESSION:  No active disease. Electronically Signed   By: Duanne Guess D.O.   On: 01/16/2023 21:10    Pending Labs Unresulted Labs (From admission, onward)     Start     Ordered   01/24/23 0500  Creatinine, serum  (enoxaparin (LOVENOX)    CrCl >/= 30 ml/min)  Weekly,   R     Comments: while on enoxaparin therapy    01/17/23 0052   01/17/23 0745  CBC  ONCE - STAT,   STAT        01/17/23 0744   01/17/23 0500  Basic metabolic panel  Daily,   R      01/17/23 0052   01/17/23 0500  CBC  Daily,   R      01/17/23 0052   01/16/23 2157  Urinalysis, Routine w reflex microscopic -Urine, Clean Catch  Once,   URGENT       Question:  Specimen Source  Answer:  Urine, Clean Catch   01/16/23 2157   01/16/23 1953  Blood culture (routine x 2)  BLOOD CULTURE X 2,   R      01/16/23 1952            Vitals/Pain Today's Vitals   01/17/23 0634 01/17/23 0635 01/17/23 0636 01/17/23 0637  BP: (!) 134/110     Pulse: (!) 102 97    Resp: 17 19    Temp:    98.3 F (36.8 C)  TempSrc:    Oral  SpO2: 100% 98%    PainSc:   0-No pain     Isolation Precautions No active isolations  Medications Medications  aspirin EC tablet 81 mg (has no administration in time range)  carvedilol (COREG) tablet 6.25 mg (has no administration in time range)  irbesartan (AVAPRO) tablet 300 mg (has no administration in time range)  insulin aspart (novoLOG) injection 0-6 Units ( Subcutaneous Not Given 01/17/23 0727)  insulin aspart (novoLOG) injection 0-5 Units (0 Units Subcutaneous Hold 01/17/23 0201)  enoxaparin (LOVENOX) injection 40 mg (has no administration in time range)  sodium chloride flush (NS) 0.9 % injection 3 mL (3 mLs Intravenous Given 01/17/23 0201)  acetaminophen (TYLENOL) tablet 650 mg (has no administration in time range)    Or  acetaminophen (TYLENOL) suppository 650 mg (has no administration in time range)  senna-docusate (Senokot-S) tablet 1 tablet (has no administration in time range)  ondansetron  (ZOFRAN) tablet 4 mg (has no administration in time range)    Or  ondansetron (ZOFRAN) injection 4 mg (has no administration in time range)  lactated ringers bolus 500 mL (0 mLs Intravenous Stopped 01/16/23 2343)  cefTRIAXone (ROCEPHIN) 1 g in sodium chloride 0.9 % 100 mL IVPB (0 g Intravenous Stopped 01/16/23 2309)    Mobility walks with person assist      R Recommendations: See Admitting Provider Note  Report given to: [id number]

## 2023-01-17 NOTE — ED Notes (Signed)
Pt transported to CT ?

## 2023-01-17 NOTE — ED Notes (Signed)
Tech asked pt if she needed to urinate and informed her we needed a sample, pt states she is unable to at this time

## 2023-01-17 NOTE — H&P (Signed)
History and Physical    SHOKO MCGOWAN VHQ:[id number] DOB: October 01, 1933 DOA: 01/16/2023  PCP: Dorothyann Peng, MD   Patient coming from: Home   Chief Complaint: AMS   HPI: Helen Richardson is a 89 y.o. female with medical history significant for hypertension, hyperlipidemia, chronic HFpEF, chronic anemia, CKD 3A, and insulin-dependent diabetes mellitus who presents to the emergency department with altered mental status.   Patient has no complaints, states that she feels well, and is unsure why she is in the emergency department.  Family member had called 911 when the patient appeared to have labored respirations when coming out of the bathroom, and then became unresponsive while laying in a recliner.  By the time of EMS arrival, she was responding, but was lethargic and disoriented.  She was noted to have blood sugar of 57 which improved with juice.  ED Course: Upon arrival to the ED, patient is found to be afebrile and saturating well on room air with systolic blood pressure in the 90s and greater.  Labs are most notable for WBC 13,600, hemoglobin 8.2, lactic acid 2.9, and creatinine 1.05.  Chest x-ray is negative for acute findings.  Blood cultures were ordered from the ED and the patient was given 500 mL of LR and 1 g IV Rocephin.  Urinalysis was ordered but not yet collected.  Review of Systems:  ROS limited by patient's clinical condition.  Past Medical History:  Diagnosis Date   CHF (congestive heart failure) (HCC)    Diabetes mellitus without complication (HCC)    High cholesterol    HTN (hypertension)    Obesity     Past Surgical History:  Procedure Laterality Date   CATARACT EXTRACTION, BILATERAL     ingrown toenail Left    left great toenail   PARTIAL HYSTERECTOMY      Social History:   reports that she has never smoked. She has never used smokeless tobacco. She reports that she does not drink alcohol and does not use drugs.  Allergies  Allergen Reactions   Tradjenta  [Linagliptin] Swelling    Family History  Problem Relation Age of Onset   Diabetes Mother    Hypertension Mother    Hypertension Father    Stroke Father      Prior to Admission medications   Medication Sig Start Date End Date Taking? Authorizing Provider  ASPIRIN EC ADULT LOW DOSE 81 MG tablet Take 81 mg by mouth daily.    [provider]  CALCIUM + VITAMIN D3 600-5 MG-MCG TABS Take 1 tablet by mouth daily.    [provider]  carvedilol (COREG) 6.25 MG tablet Take 1 tablet (6.25 mg total) by mouth 2 (two) times daily. 07/24/21   Dorothyann Peng, MD  cephALEXin (KEFLEX) 250 MG capsule Take 1 capsule (250 mg total) by mouth 4 (four) times daily. 08/31/21   Mancel Bale, MD  diclofenac sodium (VOLTAREN) 1 % GEL APPLY TO AFFECTED AREA 3 TIMES DAILY AS NEEDED Patient taking differently: Apply 2 g topically 3 (three) times daily as needed (for pain). 06/16/18   Dorothyann Peng, MD  FEROSUL 325 (65 Fe) MG tablet Take 325 mg by mouth 2 (two) times daily.    [provider]  furosemide (LASIX) 40 MG tablet TAKE 1 TABLET(40 MG) BY MOUTH DAILY 07/24/21   Dorothyann Peng, MD  insulin degludec (TRESIBA FLEXTOUCH) 200 UNIT/ML FlexTouch Pen Inject 40 Units into the skin daily. INJECT 36 UNITS UNDER THE SKIN DAILY. Patient taking differently: Inject 40  Units into the skin daily. 03/27/21   Dorothyann Peng, MD  potassium chloride SA (KLOR-CON) 20 MEQ tablet Take 1 tablet (20 mEq total) by mouth once for 1 dose. Please d/c 2 rx for 2 times per day thanks Patient taking differently: Take 20 mEq by mouth daily. 04/12/20 07/14/22  [name], NP  rosuvastatin (CRESTOR) 10 MG tablet Take 1 tablet (10 mg total) by mouth See admin instructions. MONDAY-FRIDAY. DO NOT TAKE ON SATURDAY OR SUNDAY 06/25/21   Dorothyann Peng, MD  Semaglutide,0.25 or 0.5MG /DOS, (OZEMPIC, 0.25 OR 0.5 MG/DOSE,) 2 MG/1.5ML SOPN Inject 0.25 mg into the skin once a week. 08/12/21   Dorothyann Peng, MD  telmisartan  (MICARDIS) 80 MG tablet TAKE 1 TABLET(80 MG) BY MOUTH DAILY 07/24/21   Dorothyann Peng, MD    Physical Exam: Vitals:   01/16/23 2152 01/16/23 2224 01/16/23 2300 01/16/23 2315  BP:  133/60 (!) 144/59   Pulse:      Resp: (!) 23 (!) 26 (!) 35 (!) 22  Temp:  98.3 F (36.8 C)    TempSrc:      SpO2:        Constitutional: NAD, no pallor or diaphoresis   Eyes: PERTLA, lids and conjunctivae normal ENMT: Mucous membranes are moist. Posterior pharynx clear of any exudate or lesions.   Neck: supple, no masses  Respiratory:  no wheezing, no crackles. No accessory muscle use.  Cardiovascular: S1 & S2 heard, regular rate and rhythm. No extremity edema.  Abdomen: No distension, no tenderness, soft. Bowel sounds active.  Musculoskeletal: no clubbing / cyanosis. No joint deformity upper and lower extremities.   Skin: no significant rashes, lesions, ulcers. Warm, dry, well-perfused. Neurologic: CN 2-12 grossly intact. Sensation to light touch intact. Moving all extremities, does not cooperate well with strength testing. Alert and oriented to person and knows she is in the hospital.     Labs and Imaging on Admission: I have personally reviewed following labs and imaging studies  CBC: Recent Labs  Lab 01/16/23 1851  WBC 13.6*  HGB 8.2*  HCT 26.7*  MCV 79.0*  PLT 552*   Basic Metabolic Panel: Recent Labs  Lab 01/16/23 1851  NA 135  K 5.0  CL 100  CO2 25  GLUCOSE 237*  BUN 12  CREATININE 1.05*  CALCIUM 8.8*   GFR: CrCl cannot be calculated (Unknown ideal weight.). Liver Function Tests: No results for input(s): "AST", "ALT", "ALKPHOS", "BILITOT", "PROT", "ALBUMIN" in the last 168 hours. No results for input(s): "LIPASE", "AMYLASE" in the last 168 hours. No results for input(s): "AMMONIA" in the last 168 hours. Coagulation Profile: No results for input(s): "INR", "PROTIME" in the last 168 hours. Cardiac Enzymes: No results for input(s): "CKTOTAL", "CKMB", "CKMBINDEX", "TROPONINI"  in the last 168 hours. BNP (last 3 results) No results for input(s): "PROBNP" in the last 8760 hours. HbA1C: No results for input(s): "HGBA1C" in the last 72 hours. CBG: Recent Labs  Lab 01/16/23 1704 01/16/23 1730 01/16/23 1912 01/16/23 2156  GLUCAP 59* 63* 182* 206*   Lipid Profile: No results for input(s): "CHOL", "HDL", "LDLCALC", "TRIG", "CHOLHDL", "LDLDIRECT" in the last 72 hours. Thyroid Function Tests: No results for input(s): "TSH", "T4TOTAL", "FREET4", "T3FREE", "THYROIDAB" in the last 72 hours. Anemia Panel: No results for input(s): "VITAMINB12", "FOLATE", "FERRITIN", "TIBC", "IRON", "RETICCTPCT" in the last 72 hours. Urine analysis:    Component Value Date/Time   COLORURINE STRAW (A) 08/31/2021 1846   APPEARANCEUR CLEAR 08/31/2021 1846   LABSPEC 1.008 08/31/2021 1846  PHURINE 6.0 08/31/2021 1846   GLUCOSEU NEGATIVE 08/31/2021 1846   HGBUR MODERATE (A) 08/31/2021 1846   BILIRUBINUR NEGATIVE 08/31/2021 1846   KETONESUR NEGATIVE 08/31/2021 1846   PROTEINUR NEGATIVE 08/31/2021 1846   UROBILINOGEN 0.2 04/21/2009 1319   NITRITE NEGATIVE 08/31/2021 1846   LEUKOCYTESUR MODERATE (A) 08/31/2021 1846   Sepsis Labs: @LABRCNTIP (procalcitonin:4,lacticidven:4) )No results found for this or any previous visit (from the past 240 hour(s)).   Radiological Exams on Admission: DG Chest Portable 1 View  Result Date: 01/16/2023 CLINICAL DATA:  Weakness EXAM: PORTABLE CHEST 1 VIEW COMPARISON:  07/13/2021 FINDINGS: The heart size and mediastinal contours are within normal limits. Aortic atherosclerosis. Low lung volumes. No focal airspace consolidation, pleural effusion, or pneumothorax. IMPRESSION: No active disease. Electronically Signed   By: Duanne Guess D.O.   On: 01/16/2023 21:10     Assessment/Plan   1. Acute encephalopathy  - Appeared fatigued with labored respirations, stopped responding to family momentarily, and was then disoriented  - Check head CT, EEG, and AMS  labs, use delirium precautions, supportive care    2. SIRS  - Leukocytosis and tachycardia present on admission without fever  - No evidence for infection on CXR or exam  - Blood cultures and UA were ordered from ED but not yet collected  - Follow-up UA, follow cultures and clinical course, watch off of antibiotics for now    3. Type II DM; hypoglycemia  - She has mild hypoglycemia with EMS and again in ED d/t taking insulin and not eating much  - Check CBGs, treat hypoglycemia as needed, use low-intensity SSI as needed    4. CKD 3A  - Appears close to baseline  - Renally-dose medications    5. Chronic HFpEF  - Appears compensated  - Given fluid bolus in ED for SBP 91 and elevated lactate  - Hold Lasix initially, monitor weight and I/Os    6. Hypertension  - Continue ARB and beta-blocker as tolerated    7. Anemia  - Appears stable, no overt bleeding     DVT prophylaxis: Lovenox  Code Status: Full  Level of Care: Level of care: Telemetry Medical Family Communication: Unable to reach family at time of admission  Disposition Plan:  Patient is from: home  Anticipated d/c is to: TBD Anticipated d/c date is: 11/16 or 01/18/23  Patient currently: Pending AMS workup  Consults called: None  Admission status: Observation     Briscoe Deutscher, MD [facility]  01/17/2023, 12:53 AM

## 2023-01-17 NOTE — ED Notes (Signed)
EEG at bedside.

## 2023-01-18 DIAGNOSIS — E11649 Type 2 diabetes mellitus with hypoglycemia without coma: Secondary | ICD-10-CM | POA: Diagnosis present

## 2023-01-18 DIAGNOSIS — G9341 Metabolic encephalopathy: Secondary | ICD-10-CM | POA: Diagnosis present

## 2023-01-18 DIAGNOSIS — Z7982 Long term (current) use of aspirin: Secondary | ICD-10-CM | POA: Diagnosis not present

## 2023-01-18 DIAGNOSIS — E1122 Type 2 diabetes mellitus with diabetic chronic kidney disease: Secondary | ICD-10-CM | POA: Diagnosis present

## 2023-01-18 DIAGNOSIS — E7800 Pure hypercholesterolemia, unspecified: Secondary | ICD-10-CM | POA: Diagnosis present

## 2023-01-18 DIAGNOSIS — R4182 Altered mental status, unspecified: Secondary | ICD-10-CM | POA: Diagnosis present

## 2023-01-18 DIAGNOSIS — R6511 Systemic inflammatory response syndrome (SIRS) of non-infectious origin with acute organ dysfunction: Secondary | ICD-10-CM | POA: Diagnosis present

## 2023-01-18 DIAGNOSIS — Z8249 Family history of ischemic heart disease and other diseases of the circulatory system: Secondary | ICD-10-CM | POA: Diagnosis not present

## 2023-01-18 DIAGNOSIS — D72829 Elevated white blood cell count, unspecified: Secondary | ICD-10-CM | POA: Diagnosis present

## 2023-01-18 DIAGNOSIS — E872 Acidosis, unspecified: Secondary | ICD-10-CM | POA: Diagnosis present

## 2023-01-18 DIAGNOSIS — D509 Iron deficiency anemia, unspecified: Secondary | ICD-10-CM | POA: Diagnosis present

## 2023-01-18 DIAGNOSIS — Z79899 Other long term (current) drug therapy: Secondary | ICD-10-CM | POA: Diagnosis not present

## 2023-01-18 DIAGNOSIS — I5032 Chronic diastolic (congestive) heart failure: Secondary | ICD-10-CM | POA: Diagnosis present

## 2023-01-18 DIAGNOSIS — N1831 Chronic kidney disease, stage 3a: Secondary | ICD-10-CM | POA: Diagnosis present

## 2023-01-18 DIAGNOSIS — Z823 Family history of stroke: Secondary | ICD-10-CM | POA: Diagnosis not present

## 2023-01-18 DIAGNOSIS — Z833 Family history of diabetes mellitus: Secondary | ICD-10-CM | POA: Diagnosis not present

## 2023-01-18 DIAGNOSIS — R6510 Systemic inflammatory response syndrome (SIRS) of non-infectious origin without acute organ dysfunction: Secondary | ICD-10-CM | POA: Diagnosis present

## 2023-01-18 DIAGNOSIS — Z7985 Long-term (current) use of injectable non-insulin antidiabetic drugs: Secondary | ICD-10-CM | POA: Diagnosis not present

## 2023-01-18 DIAGNOSIS — I130 Hypertensive heart and chronic kidney disease with heart failure and stage 1 through stage 4 chronic kidney disease, or unspecified chronic kidney disease: Secondary | ICD-10-CM | POA: Diagnosis present

## 2023-01-18 DIAGNOSIS — Z794 Long term (current) use of insulin: Secondary | ICD-10-CM | POA: Diagnosis not present

## 2023-01-18 DIAGNOSIS — Z90711 Acquired absence of uterus with remaining cervical stump: Secondary | ICD-10-CM | POA: Diagnosis not present

## 2023-01-18 DIAGNOSIS — Z888 Allergy status to other drugs, medicaments and biological substances status: Secondary | ICD-10-CM | POA: Diagnosis not present

## 2023-01-18 DIAGNOSIS — G9340 Encephalopathy, unspecified: Secondary | ICD-10-CM | POA: Diagnosis not present

## 2023-01-18 LAB — HEMOGLOBIN AND HEMATOCRIT, BLOOD
HCT: 26.3 % — ABNORMAL LOW (ref 36.0–46.0)
Hemoglobin: 8.2 g/dL — ABNORMAL LOW (ref 12.0–15.0)

## 2023-01-18 LAB — GLUCOSE, CAPILLARY
Glucose-Capillary: 69 mg/dL — ABNORMAL LOW (ref 70–99)
Glucose-Capillary: 182 mg/dL — ABNORMAL HIGH (ref 70–99)
Glucose-Capillary: 213 mg/dL — ABNORMAL HIGH (ref 70–99)
Glucose-Capillary: 217 mg/dL — ABNORMAL HIGH (ref 70–99)
Glucose-Capillary: 256 mg/dL — ABNORMAL HIGH (ref 70–99)

## 2023-01-18 LAB — BASIC METABOLIC PANEL
Anion gap: 6 (ref 5–15)
BUN: 8 mg/dL (ref 8–23)
CO2: 24 mmol/L (ref 22–32)
Calcium: 8.3 mg/dL — ABNORMAL LOW (ref 8.9–10.3)
Chloride: 106 mmol/L (ref 98–111)
Creatinine, Ser: 0.94 mg/dL (ref 0.44–1.00)
GFR, Estimated: 59 mL/min — ABNORMAL LOW (ref >60)
Glucose, Bld: 74 mg/dL (ref 70–99)
Potassium: 4 mmol/L (ref 3.5–5.1)
Sodium: 136 mmol/L (ref 135–145)

## 2023-01-18 LAB — CBC
HCT: 22.8 % — ABNORMAL LOW (ref 36.0–46.0)
Hemoglobin: 7 g/dL — ABNORMAL LOW (ref 12.0–15.0)
MCH: 24.2 pg — ABNORMAL LOW (ref 26.0–34.0)
MCHC: 30.7 g/dL (ref 30.0–36.0)
MCV: 78.9 fL — ABNORMAL LOW (ref 80.0–100.0)
Platelets: 424 10*3/uL — ABNORMAL HIGH (ref 150–400)
RBC: 2.89 MIL/uL — ABNORMAL LOW (ref 3.87–5.11)
RDW: 18.6 % — ABNORMAL HIGH (ref 11.5–15.5)
WBC: 11 10*3/uL — ABNORMAL HIGH (ref 4.0–10.5)
nRBC: 0 % (ref 0.0–0.2)

## 2023-01-18 LAB — PREPARE RBC (CROSSMATCH)

## 2023-01-18 MED ORDER — POLYETHYLENE GLYCOL 3350 17 G PO PACK
17.0000 g | PACK | Freq: Every day | ORAL | Status: DC
  Administered 2023-01-18 – 2023-01-20 (×3): 17 g via ORAL
  Filled 2023-01-18 (×3): qty 1

## 2023-01-18 MED ORDER — SODIUM CHLORIDE 0.9 % IV SOLN: 150.0000 mg | Freq: Once | INTRAVENOUS | Status: DC

## 2023-01-18 MED ORDER — IRON SUCROSE 200 MG IVPB - SIMPLE MED
200.0000 mg | Freq: Once | Status: AC
  Administered 2023-01-18: 200 mg via INTRAVENOUS
  Filled 2023-01-18: qty 200

## 2023-01-18 MED ORDER — SENNOSIDES-DOCUSATE SODIUM 8.6-50 MG PO TABS
1.0000 | ORAL_TABLET | Freq: Two times a day (BID) | ORAL | Status: DC
  Administered 2023-01-18 – 2023-01-20 (×5): 1 via ORAL
  Filled 2023-01-18 (×5): qty 1

## 2023-01-18 MED ORDER — FERROUS SULFATE 325 (65 FE) MG PO TABS
325.0000 mg | ORAL_TABLET | Freq: Every day | ORAL | Status: DC
  Administered 2023-01-19 – 2023-01-20 (×2): 325 mg via ORAL
  Filled 2023-01-18 (×2): qty 1

## 2023-01-18 MED ORDER — SODIUM CHLORIDE 0.9% IV SOLUTION: Freq: Once | INTRAVENOUS | Status: AC

## 2023-01-18 NOTE — Progress Notes (Signed)
PROGRESS NOTE  Helen Richardson  UYQ:[id number] DOB: 1933/10/24 DOA: 01/16/2023 PCP: Dorothyann Peng, MD   Brief Narrative: Patient is a 89 year old female with history of hypertension, hyperlipidemia, diastolic CHF, chronic normocytic anemia, CKD stage III, insulin-dependent diabetes type 2 who presented with altered mental status from home.  She was confused on presentation.  She was found to be unresponsive while lying on the recliner, lethargic, disoriented on EMS arrival.  Blood sugars are noticed to be low in the range of 57.  On presentation, she was hemodynamically stable.  Lab work showed WBC count of 13.6, hemoglobin of 8.2, lactate of 2.9.  Chest x-ray did not show pneumonia.  Patient was admitted for the further evaluation of altered mental status.  CT head did not show any acute findings but showed atrophy, small-vessel disease and chronic lacunar infarcts.  EEG did not show any seizure however able to from discharge.  Currently she is alert and oriented.  PT/OT evaluation done recommended SNF on discharge  Assessment & Plan:  Principal Problem:   Acute encephalopathy Active Problems:   Type 2 diabetes mellitus with stage 3 chronic kidney disease, with long-term current use of insulin (HCC)   HTN (hypertension)   CKD stage 3a, GFR 45-59 ml/min (HCC)   SIRS (systemic inflammatory response syndrome) (HCC)   Chronic heart failure with preserved ejection fraction (HFpEF) (HCC)   Microcytic anemia  Acute encephalopathy: Unclear etiology.  Found to be confused, lethargic at home.  Hemodynamically stable. CT head did not show any acute findings but showed atrophy, small-vessel disease and chronic lacunar infarcts.  EEG did not show any seizure or epileptiform discharge.   Ammonia level normal. Encephalopathy has resolved.  Currently she remains alert and oriented.  Follows commands very well.  PT/OT consulted, SNF recommended  Normocytic anemia:   No evidence of acute blood loss.   Hemoglobin in the range of 7 today.  Hemoglobin was in the range of 10 about a month ago.  Started on Protonix.  Patient was on aspirin at home,will hold.  Will check FOBT.  No report of hematochezia or melena or change in the color of the stool.  Given a dose of IV iron today.  Will also order a unit of PRBC,.  SIRS: Presented with leukocytosis, tachycardia, lactic acidosis elevation..  Monitor off antibiotics.  Afebrile.  Chest x-ray did not show pneumonia.  UA not suspicious for UTI.  Leukocytosis improved  Insulin-dependent type 2 diabetes/hypoglycemia: Mild hypoglycemic when EMS arrived.  Currently on sliding scale.  A1c of 6.1.  Takes Evaristo Bury, Ozempic at home.  CKD stage IIIa: Currently kidney at baseline  Diastolic CHF: Currently appears euvolemic.  Lasix on hold.  Given IV fluid in the emergency department  Hypertension: On ARB and beta-blocker.  Now on hold due to soft blood pressure  Debility/deconditioning: PT/OT recommended SNF.  Patient lives alone.  TOC following         DVT prophylaxis:enoxaparin (LOVENOX) injection 40 mg Start: 01/17/23 1000     Code Status: Full Code  Family Communication: called and discussed with daughter in law Burt on phone on 11/16  Patient status: In patient  Patient is from :Home  Anticipated discharge to: SnF  Estimated DC date:1-2 days   Consultants: None  Procedures:None  Antimicrobials:  Anti-infectives (From admission, onward)    Start     Dose/Rate Route Frequency Ordered Stop   01/16/23 2200  cefTRIAXone (ROCEPHIN) 1 g in sodium chloride 0.9 % 100 mL IVPB  1 g 200 mL/hr over 30 Minutes Intravenous  Once 01/16/23 2157 01/16/23 2309       Subjective: Seen and examined the bedside today.  Hemodynamically stable.  Comfortable.  Sitting on the chair.  Alert oriented.  Denies new complaints.   Objective: Vitals:   02/09/23 1528 09-Feb-2023 2003 01/18/23 0503 01/18/23 0722  BP: 124/62 (!) 148/77 94/69 (!) 85/43   Pulse: 90 (!) 103 81 86  Resp: 16 16  16   Temp: 98.7 F (37.1 C) 98.3 F (36.8 C) 97.7 F (36.5 C) 97.9 F (36.6 C)  TempSrc: Oral Oral Oral Oral  SpO2: 98% 98% 100% 100%  Weight:      Height:        Intake/Output Summary (Last 24 hours) at 01/18/2023 1013 Last data filed at 01/18/2023 0845 Gross per 24 hour  Intake 700 ml  Output 0 ml  Net 700 ml   Filed Weights   2023/02/09 1045  Weight: 76.1 kg    Examination:  General exam: Overall comfortable, not in distress, pleasant elderly female HEENT: PERRL Respiratory system:  no wheezes or crackles  Cardiovascular system: S1 & S2 heard, RRR.  Gastrointestinal system: Abdomen is nondistended, soft and nontender. Central nervous system: Alert and oriented Extremities: No edema, no clubbing ,no cyanosis Skin: No rashes, no ulcers,no icterus     Data Reviewed: I have personally reviewed following labs and imaging studies  CBC: Recent Labs  Lab 01/16/23 1851 02-09-23 0913 01/18/23 0440  WBC 13.6* 12.8* 11.0*  HGB 8.2* 7.7* 7.0*  HCT 26.7* 25.3* 22.8*  MCV 79.0* 79.1* 78.9*  PLT 552* 563* 424*   Basic Metabolic Panel: Recent Labs  Lab 01/16/23 1851 09-Feb-2023 0516 01/18/23 0440  NA 135 134* 136  K 5.0 4.5 4.0  CL 100 101 106  CO2 25 25 24   GLUCOSE 237* 166* 74  BUN 12 11 8   CREATININE 1.05* 0.89 0.94  CALCIUM 8.8* 8.7* 8.3*  MG  --  2.0  --   PHOS  --  2.8  --      Recent Results (from the past 240 hour(s))  Blood culture (routine x 2)     Status: None (Preliminary result)   Collection Time: 02-09-2023  5:14 AM   Specimen: BLOOD RIGHT ARM  Result Value Ref Range Status   Specimen Description BLOOD RIGHT ARM  Final   Special Requests   Final    BOTTLES DRAWN AEROBIC ONLY Blood Culture adequate volume   Culture   Final    NO GROWTH 1 DAY Performed at [facility], [address], [place], [place] [address]    Report Status PENDING  Incomplete  Blood culture (routine x 2)     Status: None  (Preliminary result)   Collection Time: February 09, 2023  5:16 AM   Specimen: BLOOD RIGHT HAND  Result Value Ref Range Status   Specimen Description BLOOD RIGHT HAND  Final   Special Requests   Final    BOTTLES DRAWN AEROBIC ONLY Blood Culture adequate volume   Culture   Final    NO GROWTH 1 DAY Performed at [facility], [address], [place], [place] [address]    Report Status PENDING  Incomplete     Radiology Studies: EEG adult  Result Date: 02-09-23 Charlsie Quest, MD     02-09-2023 10:54 AM Patient Name: YENIFER DREESEN MRN: [id number] Epilepsy Attending: Charlsie Quest Referring Physician/Provider: Briscoe Deutscher, MD Date: 02-09-2023 Duration: 24.59 mins  Patient history: 89yo F with ams getting eeg to evaluate for seizure Level of alertness: Awake AEDs during EEG study: None Technical aspects: This EEG study was done with scalp electrodes positioned according to the 10-20 International system of electrode placement. Electrical activity was reviewed with band pass filter of 1-70Hz , sensitivity of 7 uV/mm, display speed of 93mm/sec with a 60Hz  notched filter applied as appropriate. EEG data were recorded continuously and digitally stored.  Video monitoring was available and reviewed as appropriate. Description: The posterior dominant rhythm consists of 8 Hz activity of moderate voltage (25-35 uV) seen predominantly in posterior head regions, symmetric and reactive to eye opening and eye closing. EEG showed intermittent generalized 3 to 6 Hz theta-delta slowing. Hyperventilation and photic stimulation were not performed.   Of note, study was technically difficult due to significant myogenic artifact. ABNORMALITY - Intermittent slow, generalized IMPRESSION: This technically difficult study is suggestive of mild severe diffuse encephalopathy. No seizures or epileptiform discharges were seen throughout the recording. Priyanka Annabelle Harman   CT HEAD WO CONTRAST ( )  Result Date:  01/17/2023 CLINICAL DATA:  Sundowning and confusion. Mental status changes, unknown cause. EXAM: CT HEAD WITHOUT CONTRAST TECHNIQUE: Contiguous axial images were obtained from the base of the skull through the vertex without intravenous contrast. RADIATION DOSE REDUCTION: This exam was performed according to the departmental dose-optimization program which includes automated exposure control, adjustment of the mA and/or kV according to patient size and/or use of iterative reconstruction technique. COMPARISON:  None Available. FINDINGS: Brain: There is mild-to-moderate cerebral atrophy, small-vessel disease and atrophic ventriculomegaly, with no the midline shift. The cerebellum and brainstem are normal in volume and attenuation except for a subcentimeter chronic lacunar infarct in the superior right cerebellar hemisphere. There are few bilateral tiny chronic gangliocapsular lacunar infarcts. Scattered benign dural calcifications along the falx. No asymmetry is seen worrisome for an acute cortical based infarct, hemorrhage, mass or mass effect. Basal cisterns are clear. Vascular: There are patchy calcifications in the carotid siphons. No hyperdense central vessel is seen. Skull: Negative for fractures or focal lesions. Sinuses/Orbits: Evidence of prior lens replacements. Otherwise negative orbits. There is mild membrane disease in the ethmoid air cells. Other paranasal sinuses, visualized mastoid air cells, and middle ear cavities are clear. Nasal septum deviates slightly to the left. Other: None. IMPRESSION: 1. No acute intracranial CT findings. 2. Atrophy, small-vessel disease and chronic lacunar infarcts. 3. Carotid atherosclerosis. 4. Mild ethmoid membrane disease. Electronically Signed   By: Almira Bar M.D.   On: 01/17/2023 01:51   DG Chest Portable 1 View  Result Date: 01/16/2023 CLINICAL DATA:  Weakness EXAM: PORTABLE CHEST 1 VIEW COMPARISON:  07/13/2021 FINDINGS: The heart size and mediastinal  contours are within normal limits. Aortic atherosclerosis. Low lung volumes. No focal airspace consolidation, pleural effusion, or pneumothorax. IMPRESSION: No active disease. Electronically Signed   By: Duanne Guess D.O.   On: 01/16/2023 21:10    Scheduled Meds:  aspirin EC  81 mg Oral Daily   carvedilol  6.25 mg Oral BID   enoxaparin (LOVENOX) injection  40 mg Subcutaneous Q24H   irbesartan  300 mg Oral Daily   pantoprazole  40 mg Oral Daily   sodium chloride flush  3 mL Intravenous Q12H   Continuous Infusions:   LOS: 0 days   Burnadette Pop, MD [facility] P11/17/2024, 10:13 AM

## 2023-01-18 NOTE — Evaluation (Signed)
Physical Therapy Evaluation Patient Details Name: Helen Richardson MRN: [id number] DOB: 08-18-1933 Today's Date: 01/18/2023  History of Present Illness  Pt is an 89 y/o F presenting to ED on 11/15 from home with AMS, fatigue, and hypoglycemia, admitted for acute encephalopathy. PMH includes DM, CHF, HTN, high cholesterol.  Clinical Impression   Pt admitted with above diagnosis. Getting solid information re: Helen Richardson' home situation and available assist has been a multidisciplinary process; She lives at home, in a single-level home with a level entry; Prior to admission, pt was able to manage short distance ambulation with RW, and stated her caregiver, Helen Richardson, helps with bathing and dressing in the morning, and setup meals (from Me[employer] Wheels); Presents to PT with a functional decline, generalized weakness, incr fall risk, and functional dependencies for mobility and ADLs;  Incr time and multiple repeated cues to initiate and fully complete simple mobility tasks; Min assist to come to fully upright sitting at EOB, Min assist to stand, and close guard assist for safety with short-distance amb with RW; I have concerns for pt's safety at home with current home situation; Pt indicated she needs more help at home more than once during PT session; We discussed going to post-acute rehab to get stronger and reduce her fall risk, and she agreed; Pt currently with functional limitations due to the deficits listed below (see PT Problem List). Pt will benefit from skilled PT to increase their independence and safety with mobility to allow discharge to the venue listed below.            If plan is discharge home, recommend the following: A little help with walking and/or transfers;Assistance with cooking/housework;A little help with bathing/dressing/bathroom   Can travel by private vehicle   Yes    Equipment Recommendations None recommended by PT  Recommendations for Other Services  OT consult;Other  (comment) (Given 89 yo, slow moving, and pt stating she needs more help, very much appreciate TOC's presence)    Functional Status Assessment Patient has had a recent decline in their functional status and demonstrates the ability to make significant improvements in function in a reasonable and predictable amount of time.     Precautions / Restrictions Precautions Precautions: Fall Restrictions Weight Bearing Restrictions: No      Mobility  Bed Mobility Overal bed mobility: Needs Assistance Bed Mobility: Supine to Sit     Supine to sit: Min assist     General bed mobility comments: incr time and need for reminders to complete task; min handheld assist to pull fully to upright sitting    Transfers Overall transfer level: Needs assistance Equipment used: Rolling walker (2 wheels) Transfers: Sit to/from Stand Sit to Stand: Min assist           General transfer comment: Notable bil LE weakness, with heavy dependence on trunk forward lean onto RW to lift hips from bed, then pushes into RW with bil UEs to bring trunk to fully upright    Ambulation/Gait Ambulation/Gait assistance: Contact guard assist Gait Distance (Feet): 20 Feet Assistive device: Rolling walker (2 wheels) Gait Pattern/deviations: Step-through pattern, Decreased step length - right, Decreased step length - left       General Gait Details: Short, slow steps, with dependence on RW for steadiness  Stairs            Wheelchair Mobility     Tilt Bed    Modified Rankin (Stroke Patients Only)       Balance  Sitting balance-[name]: Good       Standing balance-[name]: Poor                               Pertinent Vitals/Pain Pain Assessment Pain Assessment: No/denies pain    Home Living Family/patient expects to be discharged to:: Private residence Living Arrangements: Alone;Non-relatives/Friends;Other (Comment) (care giver Helen Richardson) Available Help at  Discharge: Available PRN/intermittently;Personal care attendant (reports aide "Helen Richardson" comes to house daily, from AM to PM) Type of Home: House Home Access: Level entry       Home Layout: One level   Additional Comments: Questionable historian; have discussed with TOC RN and hospital RN that we need to verify realistic assist at home; initially in session, pt stating caregiver helps her in the morning only; later in session pt states caregiver sets her up with all meals    Prior Function Prior Level of Function : Needs assist             Mobility Comments: uses RW ADLs Comments: reports has assist for bathing and meals all ADLs, reports her daughter in law "Helen Richardson" takes her to dr appts     Extremity/Trunk Assessment   Upper Extremity Assessment Upper Extremity Assessment: Defer to OT evaluation    Lower Extremity Assessment Lower Extremity Assessment: Generalized weakness       Communication   Communication Communication: No apparent difficulties  Cognition Arousal: Alert Behavior During Therapy: WFL for tasks assessed/performed Overall Cognitive Status: No family/caregiver present to determine baseline cognitive functioning                                 General Comments: disoriented to date and time, aware she is at the hospital. follows commands with incr time and repetition; slow to answer questions        General Comments General comments (skin integrity, edema, etc.): During session, pt stated more than once that she needs more help at home; we discussed the possibility of going to short term SNF for rehab, and pt agrees    Exercises     Assessment/Plan    PT Assessment Patient needs continued PT services  PT Problem List Decreased strength;Decreased activity tolerance;Decreased balance;Decreased mobility;Decreased coordination;Decreased cognition;Decreased safety awareness;Decreased knowledge of precautions       PT Treatment Interventions  DME instruction;Gait training;Functional mobility training;Stair training;Therapeutic activities;Therapeutic exercise;Balance training;Neuromuscular re-education;Cognitive remediation;Patient/family education;Wheelchair mobility training    PT Goals (Current goals can be found in the Care Plan section)  Acute Rehab PT Goals Patient Stated Goal: Did not specifically state, but agrees to get up and OOB for breakfast PT Goal Formulation: With patient Time For Goal Achievement: 02/01/23 Potential to Achieve Goals: Good    Frequency Min 1X/week     Co-evaluation               AM-PAC PT "6 Clicks" Mobility  Outcome Measure Help needed turning from your back to your side while in a flat bed without using bedrails?: A Little Help needed moving from lying on your back to sitting on the side of a flat bed without using bedrails?: A Little Help needed moving to and from a bed to a chair (including a wheelchair)?: A Little Help needed standing up from a chair using your arms (e.g., wheelchair or bedside chair)?: A Little Help needed to walk in hospital room?: A Little Help  needed climbing 3-5 steps with a railing? : A Lot 6 Click Score: 17    End of Session Equipment Utilized During Treatment: Gait belt Activity Tolerance: Patient tolerated treatment well Patient left: in chair;with call bell/phone within reach;with chair alarm set Nurse Communication: Mobility status;Other (comment) (teh house phone in her room is not functionaling) PT Visit Diagnosis: Unsteadiness on feet (R26.81);Other abnormalities of gait and mobility (R26.89);Muscle weakness (generalized) (M62.81)    Time: 9147-8295 PT Time Calculation (min) (ACUTE ONLY): 32 min   Charges:   PT Evaluation $PT Eval Moderate Complexity: 1 Mod PT Treatments $Gait Training: 8-22 mins PT General Charges $$ ACUTE PT VISIT: 1 Visit         Van Clines, PT  Acute Rehabilitation Services Office [phone] Secure Chat  welcomed   Levi Aland 01/18/2023, 9:58 AM

## 2023-01-18 NOTE — TOC Progression Note (Addendum)
Transition of Care [facility]) - Progression Note    Patient Details  Name: Helen Richardson MRN: [id number] Date of Birth: [birth year]  [facility]) CM/SW Contact  Ronny Bacon, RN Phone Number: 01/18/2023, 8:46 AM  Clinical Narrative:   Spoke with patient at bedside. Her main care giver was her younger son who passed away last year. Patient admits she has trouble managing at home alone. Selena Batten comes in little before 10 am fixes her breakfast receives her meals on wheels. Pt is home alone majority of the day. Son, Rachael Fee is an out of town Naval architect. Patient has various relatives that assist when needed but she is home alone most of the time.  61: Spoke with son Darryl by phone, reports that he found out in the last 72 hours that Selena Batten moved in and lives there for free as exchange to care for patient. Kim instead rushes patient to get ready in the morning so that she can leave to care for a private client she has. [name], Darryl's ex-wife, comes in but sleeps all day and does not give the patient her medication as directed. Darryl admits there is no one available to provide the care the patient needs at this time. He is agreeable for patient to go to SNF. Also, per Darryl, his brother passed away 11 years ago and not 1 year ago as the patient said. Provider and CSW updated on the current situation with patient.         Expected Discharge Plan and Services                                               Social Determinants of Health (SDOH) Interventions SDOH Screenings   Food Insecurity: No Food Insecurity (01/17/2023)  Housing: Low Risk  (01/17/2023)  Transportation Needs: No Transportation Needs (01/17/2023)  Utilities: Not At Risk (01/17/2023)  Alcohol Screen: Low Risk  (10/18/2021)  Depression (PHQ2-9): Low Risk  (10/18/2021)  Financial Resource Strain: Low Risk  (10/18/2021)  Physical Activity: Inactive (10/18/2021)  Social Connections: Socially Isolated  (10/18/2021)  Stress: No Stress Concern Present (10/18/2021)  Tobacco Use: Low Risk  (01/16/2023)    Readmission Risk Interventions     No data to display

## 2023-01-18 NOTE — [place] FL2 (Signed)
[place] MEDICAID FL2 LEVEL OF CARE FORM     IDENTIFICATION  Patient Name: Helen Richardson Birthdate: 01-Aug-1933 Sex: female Admission Date (Current Location): 01/16/2023  [facility] and [place] Number:  Producer, television/film/video and Address:  [facility] [place], [address] [place], [place] [address]      Provider Number: [id number]  Attending Physician Name and Address:  Burnadette Pop, MD  Relative Name and Phone Number:       Current Level of Care: Hospital Recommended Level of Care: Skilled Nursing Facility Prior Approval Number:    Date Approved/Denied:   PASRR Number: [id number]  Discharge Plan: SNF    Current Diagnoses: Patient Active Problem List   Diagnosis Date Noted   Microcytic anemia 01/17/2023   SIRS (systemic inflammatory response syndrome) (HCC) 01/16/2023   Chronic heart failure with preserved ejection fraction (HFpEF) (HCC) 01/16/2023   Acute encephalopathy 01/16/2023   Intermediate stage nonexudative age-related macular degeneration of both eyes 07/04/2019   Posterior vitreous detachment of right eye 07/04/2019   Degenerative retinal drusen of both eyes 07/04/2019   Chorioretinal scar 07/04/2019   Pincer nail deformity 04/12/2019   CKD stage 3a, GFR 45-59 ml/min (HCC) 06/11/2018   Sepsis due to gram-negative UTI (HCC) 06/11/2018   Acute lower UTI (urinary tract infection) 06/10/2018   HTN (hypertension) 06/10/2018   Joint swelling 06/10/2018   Hyperkalemia 06/10/2018   Hyponatremia 06/10/2018   Normocytic anemia 06/10/2018   Type 2 diabetes mellitus with stage 3 chronic kidney disease, with long-term current use of insulin (HCC) 03/08/2018   Hypertensive nephropathy 03/08/2018   Pure hypercholesterolemia 03/08/2018   Class 1 obesity due to excess calories with serious comorbidity and body mass index (BMI) of 34.0 to 34.9 in adult 03/08/2018    Orientation RESPIRATION BLADDER Height & Weight     Self, Time, Situation,  Place  Normal Incontinent Weight: 167 lb 12.3 oz (76.1 kg) Height:  5\' 6"  (167.6 cm)  BEHAVIORAL SYMPTOMS/MOOD NEUROLOGICAL BOWEL NUTRITION STATUS      Continent    AMBULATORY STATUS COMMUNICATION OF NEEDS Skin   Limited Assist Verbally Normal                       Personal Care Assistance Level of Assistance  Bathing, Feeding, Dressing Bathing Assistance: Limited assistance Feeding assistance: Limited assistance Dressing Assistance: Limited assistance     Functional Limitations Info  Sight, Hearing, Speech Sight Info: Impaired Hearing Info: Adequate Speech Info: Adequate    SPECIAL CARE FACTORS FREQUENCY  PT (By licensed PT), OT (By licensed OT)                    Contractures Contractures Info: Not present    Additional Factors Info  Code Status Code Status Info: FULL CODE             Current Medications (01/18/2023):  This is the current hospital active medication list Current Facility-Administered Medications  Medication Dose Route Frequency Provider Last Rate Last Admin   acetaminophen (TYLENOL) tablet 650 mg  650 mg Oral Q6H PRN Opyd, Lavone Neri, MD       Or   acetaminophen (TYLENOL) suppository 650 mg  650 mg Rectal Q6H PRN Opyd, Lavone Neri, MD       aspirin EC tablet 81 mg  81 mg Oral Daily Opyd, Lavone Neri, MD   81 mg at 01/17/23 0946   carvedilol (COREG) tablet 6.25 mg  6.25 mg Oral  BID Briscoe Deutscher, MD   6.25 mg at 01/17/23 2128   enoxaparin (LOVENOX) injection 40 mg  40 mg Subcutaneous Q24H Opyd, Lavone Neri, MD   40 mg at 01/17/23 0944   irbesartan (AVAPRO) tablet 300 mg  300 mg Oral Daily Opyd, Lavone Neri, MD   300 mg at 01/17/23 0945   ondansetron (ZOFRAN) tablet 4 mg  4 mg Oral Q6H PRN Opyd, Lavone Neri, MD       Or   ondansetron (ZOFRAN) injection 4 mg  4 mg Intravenous Q6H PRN Opyd, Lavone Neri, MD       pantoprazole (PROTONIX) EC tablet 40 mg  40 mg Oral Daily Adhikari, Willia Craze, MD   40 mg at 01/17/23 1734   senna-docusate (Senokot-S) tablet 1  tablet  1 tablet Oral QHS PRN Opyd, Lavone Neri, MD       sodium chloride flush (NS) 0.9 % injection 3 mL  3 mL Intravenous Q12H Opyd, Lavone Neri, MD   3 mL at 01/17/23 2130     Discharge Medications: Please see discharge summary for a list of discharge medications.  Relevant Imaging Results:  Relevant Lab Results:   Additional Information SSN: [id number]  Deatra Robinson, [place]

## 2023-01-18 NOTE — TOC Initial Note (Signed)
Transition of Care [facility]) - Initial/Assessment Note    Patient Details  Name: Helen Richardson MRN: [id number] Date of Birth: 09-03-1933  Transition of Care [facility]) CM/SW Contact:    Deatra Robinson, LCSW Phone Number: 01/18/2023, 9:59 AM  Clinical Narrative: pt admitted from home alone, has an aide who reportedly is not assisting as expected and family not able to provide 24 hour care. Pt aware of recommendation for SNF and reports agreeable. RNCM spoke with pt's son Laban Emperor who is also agreeable to SNF. Will begin SNF search and f/u with offers as available.   Dellie Burns, MSW, LCSW [phone] (coverage)                  Expected Discharge Plan: Skilled Nursing Facility Barriers to Discharge: Continued Medical Work up, English as a second language teacher, SNF Pending bed offer   Patient Goals and CMS Choice            Expected Discharge Plan and Services     Post Acute Care Choice: Skilled Nursing Facility Living arrangements for the past 2 months: Single Family Home                                      Prior Living Arrangements/Services Living arrangements for the past 2 months: Single Family Home Lives with:: Self Patient language and need for interpreter reviewed:: Yes        Need for Family Participation in Patient Care: Yes (Comment) Care giver support system in place?: Yes (comment) Current home services: Homehealth aide Criminal Activity/Legal Involvement Pertinent to Current Situation/Hospitalization: No - Comment as needed  Activities of Daily Living   ADL Screening (condition at time of admission) Independently performs ADLs?: No Does the patient have a NEW difficulty with bathing/dressing/toileting/self-feeding that is expected to last >3 days?: No Does the patient have a NEW difficulty with getting in/out of bed, walking, or climbing stairs that is expected to last >3 days?: No Does the patient have a NEW difficulty with communication that is  expected to last >3 days?: No Is the patient deaf or have difficulty hearing?: No Does the patient have difficulty seeing, even when wearing glasses/contacts?: No Does the patient have difficulty concentrating, remembering, or making decisions?: Yes  Permission Sought/Granted Permission sought to share information with : Facility Industrial/product designer granted to share information with : Yes, Verbal Permission Granted              Emotional Assessment       Orientation: : Oriented to Self, Oriented to Place, Oriented to  Time, Oriented to Situation Alcohol / Substance Use: Not Applicable Psych Involvement: No (comment)  Admission diagnosis:  Lactic acidosis [E87.20] Weakness [R53.1] SIRS (systemic inflammatory response syndrome) (HCC) [R65.10] Patient Active Problem List   Diagnosis Date Noted   Microcytic anemia 01/17/2023   SIRS (systemic inflammatory response syndrome) (HCC) 01/16/2023   Chronic heart failure with preserved ejection fraction (HFpEF) (HCC) 01/16/2023   Acute encephalopathy 01/16/2023   Intermediate stage nonexudative age-related macular degeneration of both eyes 07/04/2019   Posterior vitreous detachment of right eye 07/04/2019   Degenerative retinal drusen of both eyes 07/04/2019   Chorioretinal scar 07/04/2019   Pincer nail deformity 04/12/2019   CKD stage 3a, GFR 45-59 ml/min (HCC) 06/11/2018   Sepsis due to gram-negative UTI (HCC) 06/11/2018   Acute lower UTI (urinary tract infection) 06/10/2018   HTN (hypertension) 06/10/2018  Joint swelling 06/10/2018   Hyperkalemia 06/10/2018   Hyponatremia 06/10/2018   Normocytic anemia 06/10/2018   Type 2 diabetes mellitus with stage 3 chronic kidney disease, with long-term current use of insulin (HCC) 03/08/2018   Hypertensive nephropathy 03/08/2018   Pure hypercholesterolemia 03/08/2018   Class 1 obesity due to excess calories with serious comorbidity and body mass index (BMI) of 34.0 to 34.9 in  adult 03/08/2018   PCP:  Dorothyann Peng, MD Pharmacy:   [facility] [phone] - [place] Otto, [place] - [address] AT [facility] [address] [facility] [address] [place] [address] Phone: [phone] Fax: [phone]  [facility] - [place], [place] - [address] [place] [address] Phone: [phone] Fax: [phone]  [facility] - [place], [place] - [address] [place] [address] Phone: [phone] Fax: [phone]     Social Determinants of Health (SDOH) Social History: SDOH Screenings   Food Insecurity: No Food Insecurity (01/17/2023)  Housing: Low Risk  (01/17/2023)  Transportation Needs: No Transportation Needs (01/17/2023)  Utilities: Not At Risk (01/17/2023)  Alcohol Screen: Low Risk  (10/18/2021)  Depression (PHQ2-9): Low Risk  (10/18/2021)  Financial Resource Strain: Low Risk  (10/18/2021)  Physical Activity: Inactive (10/18/2021)  Social Connections: Socially Isolated (10/18/2021)  Stress: No Stress Concern Present (10/18/2021)  Tobacco Use: Low Risk  (01/16/2023)   SDOH Interventions:     Readmission Risk Interventions     No data to display

## 2023-01-18 NOTE — Care Management Obs Status (Signed)
MEDICARE OBSERVATION STATUS NOTIFICATION   Patient Details  Name: Helen Richardson MRN: [id number] Date of Birth: 1933/06/30   Medicare Observation Status Notification Given:  Yes    Ronny Bacon, RN 01/18/2023, 8:30 AM

## 2023-01-19 ENCOUNTER — Telehealth: Payer: Self-pay | Admitting: Medical Oncology

## 2023-01-19 DIAGNOSIS — G9340 Encephalopathy, unspecified: Secondary | ICD-10-CM | POA: Diagnosis not present

## 2023-01-19 LAB — TYPE AND SCREEN
ABO/RH(D): B POS
Antibody Screen: NEGATIVE
Unit division: 0

## 2023-01-19 LAB — CBC
HCT: 26.2 % — ABNORMAL LOW (ref 36.0–46.0)
Hemoglobin: 8.2 g/dL — ABNORMAL LOW (ref 12.0–15.0)
MCH: 24.3 pg — ABNORMAL LOW (ref 26.0–34.0)
MCHC: 31.3 g/dL (ref 30.0–36.0)
MCV: 77.7 fL — ABNORMAL LOW (ref 80.0–100.0)
Platelets: 435 10*3/uL — ABNORMAL HIGH (ref 150–400)
RBC: 3.37 MIL/uL — ABNORMAL LOW (ref 3.87–5.11)
RDW: 17.9 % — ABNORMAL HIGH (ref 11.5–15.5)
WBC: 10.9 10*3/uL — ABNORMAL HIGH (ref 4.0–10.5)
nRBC: 0 % (ref 0.0–0.2)

## 2023-01-19 LAB — GLUCOSE, CAPILLARY
Glucose-Capillary: 109 mg/dL — ABNORMAL HIGH (ref 70–99)
Glucose-Capillary: 158 mg/dL — ABNORMAL HIGH (ref 70–99)
Glucose-Capillary: 253 mg/dL — ABNORMAL HIGH (ref 70–99)
Glucose-Capillary: 248 mg/dL — ABNORMAL HIGH (ref 70–99)
Glucose-Capillary: 266 mg/dL — ABNORMAL HIGH (ref 70–99)

## 2023-01-19 LAB — BPAM RBC
Blood Product Expiration Date: 202412042359
ISSUE DATE / TIME: 202411171242
Unit Type and Rh: 7300

## 2023-01-19 LAB — OCCULT BLOOD X 1 CARD TO LAB, STOOL: Fecal Occult Bld: POSITIVE — AB

## 2023-01-19 NOTE — Progress Notes (Signed)
PROGRESS NOTE  Helen Richardson  ZOX:[id number] DOB: 07-31-1933 DOA: 01/16/2023 PCP: No primary care provider on file.   Brief Narrative: Patient is a 89 year old female with history of hypertension, hyperlipidemia, diastolic CHF, chronic normocytic anemia, CKD stage III, insulin-dependent diabetes type 2 who presented with altered mental status from home.  She was confused on presentation.  She was found to be unresponsive while lying on the recliner, lethargic, disoriented on EMS arrival.  Blood sugars are noticed to be low in the range of 57.  On presentation, she was hemodynamically stable.  Lab work showed WBC count of 13.6, hemoglobin of 8.2, lactate of 2.9.  Chest x-ray did not show pneumonia.  Patient was admitted for the further evaluation of altered mental status.  CT head did not show any acute findings but showed atrophy, small-vessel disease and chronic lacunar infarcts.  EEG did not show any seizure however able to from discharge.  Currently she is alert and oriented.  PT/OT evaluation done recommended SNF on discharge.  Medically stable for discharge whenever possible.  Assessment & Plan:  Principal Problem:   Acute encephalopathy Active Problems:   Type 2 diabetes mellitus with stage 3 chronic kidney disease, with long-term current use of insulin (HCC)   HTN (hypertension)   CKD stage 3a, GFR 45-59 ml/min (HCC)   SIRS (systemic inflammatory response syndrome) (HCC)   Chronic heart failure with preserved ejection fraction (HFpEF) (HCC)   Microcytic anemia  Acute encephalopathy: Unclear etiology.  Found to be confused, lethargic at home.  Hemodynamically stable. CT head did not show any acute findings but showed atrophy, small-vessel disease and chronic lacunar infarcts.  EEG did not show any seizure or epileptiform discharge.   Ammonia level normal. Encephalopathy has resolved.  Currently she remains alert and oriented.  Follows commands very well.  PT/OT consulted, SNF  recommended  Normocytic anemia:   No evidence of acute blood loss.  Hemoglobin in the range of 7 -8.  Hemoglobin was in the range of 10 about few months ago.  Started on Protonix.  Patient was on aspirin at home,will resume on dc.Will check FOBT.  No report of hematochezia or melena or change in the color of the stool.  Given a dose of IV iron.  Given unit of PRBC on 11/17.She follows with Dr. Arbutus Ped for this.  As per his notes,recently checked vitamin B12 and folic acid level were normal.  We recommend to follow-up with Dr. Arbutus Ped  as an outpatient.  SIRS: Presented with leukocytosis, tachycardia, lactic acidosis elevation..  Monitor off antibiotics.  Afebrile.  Chest x-ray did not show pneumonia.  UA not suspicious for UTI.  Leukocytosis improved  Insulin-dependent type 2 diabetes/hypoglycemia: Mildly hypoglycemic when EMS arrived.  Currently on sliding scale.  A1c of 6.1.  Takes Evaristo Bury, Ozempic at home.  CKD stage IIIa: Currently kidney at baseline  Diastolic CHF: Currently appears euvolemic.  Lasix on hold.  Given IV fluid in the emergency department  Hypertension: On ARB and beta-blocker.  Now on hold due to soft blood pressure  Debility/deconditioning: PT/OT recommended SNF.  Patient lives alone.  TOC following         DVT prophylaxis:enoxaparin (LOVENOX) injection 40 mg Start: 01/17/23 1000     Code Status: Full Code  Family Communication: called and discussed with son Darryl on phone on 11/18  Patient status: In patient  Patient is from :Home  Anticipated discharge to: SnF  Estimated DC date:1-2 days   Consultants: None  Procedures:None  Antimicrobials:  Anti-infectives (From admission, onward)    Start     Dose/Rate Route Frequency Ordered Stop   01/16/23 2200  cefTRIAXone (ROCEPHIN) 1 g in sodium chloride 0.9 % 100 mL IVPB        1 g 200 mL/hr over 30 Minutes Intravenous  Once 01/16/23 2157 01/16/23 2309       Subjective: Patient seen and examined at  bedside today.  She was working with occupational therapy.  She looks comfortable.  She was standing on her feet.  Alert and oriented.  Denies new complaints  Objective: Vitals:   01/18/23 1541 01/18/23 1945 01/19/23 0513 01/19/23 0914  BP: 101/74 136/66 (!) 129/50 (!) 120/90  Pulse: 88 89 84 87  Resp: 17 17 17 18   Temp: 97.9 F (36.6 C) 98.2 F (36.8 C) 98.5 F (36.9 C) 98 F (36.7 C)  TempSrc: Oral Oral Oral Oral  SpO2: 98% 99% 100% 100%  Weight:      Height:        Intake/Output Summary (Last 24 hours) at 01/19/2023 1044 Last data filed at 01/19/2023 0600 Gross per 24 hour  Intake 1004.86 ml  Output 0 ml  Net 1004.86 ml   Filed Weights   01/17/23 1045  Weight: 76.1 kg    Examination:   General exam: Overall comfortable, not in distress, pleasant elderly female HEENT: PERRL Respiratory system:  no wheezes or crackles  Cardiovascular system: S1 & S2 heard, RRR.  Gastrointestinal system: Abdomen is nondistended, soft and nontender. Central nervous system: Alert and oriented Extremities: No edema, no clubbing ,no cyanosis Skin: No rashes, no ulcers,no icterus     Data Reviewed: I have personally reviewed following labs and imaging studies  CBC: Recent Labs  Lab 01/16/23 1851 01/17/23 0913 01/18/23 0440 01/18/23 1817 01/19/23 0442  WBC 13.6* 12.8* 11.0*  --  10.9*  HGB 8.2* 7.7* 7.0* 8.2* 8.2*  HCT 26.7* 25.3* 22.8* 26.3* 26.2*  MCV 79.0* 79.1* 78.9*  --  77.7*  PLT 552* 563* 424*  --  435*   Basic Metabolic Panel: Recent Labs  Lab 01/16/23 1851 01/17/23 0516 01/18/23 0440  NA 135 134* 136  K 5.0 4.5 4.0  CL 100 101 106  CO2 25 25 24   GLUCOSE 237* 166* 74  BUN 12 11 8   CREATININE 1.05* 0.89 0.94  CALCIUM 8.8* 8.7* 8.3*  MG  --  2.0  --   PHOS  --  2.8  --      Recent Results (from the past 240 hour(s))  Blood culture (routine x 2)     Status: None (Preliminary result)   Collection Time: 01/17/23  5:14 AM   Specimen: BLOOD RIGHT ARM   Result Value Ref Range Status   Specimen Description BLOOD RIGHT ARM  Final   Special Requests   Final    BOTTLES DRAWN AEROBIC ONLY Blood Culture adequate volume   Culture   Final    NO GROWTH 2 DAYS Performed at [facility], [address] [place], [place] [address]    Report Status PENDING  Incomplete  Blood culture (routine x 2)     Status: None (Preliminary result)   Collection Time: 01/17/23  5:16 AM   Specimen: BLOOD RIGHT HAND  Result Value Ref Range Status   Specimen Description BLOOD RIGHT HAND  Final   Special Requests   Final    BOTTLES DRAWN AEROBIC ONLY Blood Culture adequate volume   Culture   Final  NO GROWTH 2 DAYS Performed at [facility], [address] [place], [place] [address]    Report Status PENDING  Incomplete     Radiology Studies: No results found.  Scheduled Meds:  enoxaparin (LOVENOX) injection  40 mg Subcutaneous Q24H   ferrous sulfate  325 mg Oral Q breakfast   pantoprazole  40 mg Oral Daily   polyethylene glycol  17 g Oral Daily   senna-docusate  1 tablet Oral BID   sodium chloride flush  3 mL Intravenous Q12H   Continuous Infusions:   LOS: 1 day   Burnadette Pop, MD [facility] P11/18/2024, 10:44 AM

## 2023-01-19 NOTE — Progress Notes (Signed)
Physical Therapy Treatment Patient Details Name: Helen Richardson MRN: [id number] DOB: 1933-10-02 Today's Date: 01/19/2023   History of Present Illness Pt is an 89 y/o F presenting to ED on 11/15 from home with AMS, fatigue, and hypoglycemia, admitted for acute encephalopathy. PMH includes DM, CHF, HTN, high cholesterol.    PT Comments  Continuing work on functional mobility and activity tolerance;  Notably improved activity tolerance, and pt was able to incr amb distance with encouragement and chair follow for safety; Overall progressing well; Anticipate continuing good progress at post-acute rehabilitation; Hopeful that with the time at rehab, pt and family can pull together a more sustainable living situation    If plan is discharge home, recommend the following: A little help with walking and/or transfers;Assistance with cooking/housework;A little help with bathing/dressing/bathroom   Can travel by private vehicle     Yes  Equipment Recommendations  None recommended by PT    Recommendations for Other Services       Precautions / Restrictions Precautions Precautions: Fall Restrictions Weight Bearing Restrictions: No     Mobility  Bed Mobility                    Transfers Overall transfer level: Needs assistance Equipment used: Rolling walker (2 wheels) Transfers: Sit to/from Stand Sit to Stand: Min assist           General transfer comment: Smoother rise from recliner with use of armrests    Ambulation/Gait Ambulation/Gait assistance: Contact guard assist, +2 safety/equipment (chair follow) Gait Distance (Feet): 55 Feet Assistive device: Rolling walker (2 wheels) Gait Pattern/deviations: Step-through pattern, Decreased step length - right, Decreased step length - left       General Gait Details: Short, slow steps, with dependence on RW for steadiness   Stairs             Wheelchair Mobility     Tilt Bed    Modified Rankin (Stroke  Patients Only)       Balance     Sitting balance-[name]: Good       Standing balance-[name]: Poor                              Cognition Arousal: Alert Behavior During Therapy: WFL for tasks assessed/performed Overall Cognitive Status: No family/caregiver present to determine baseline cognitive functioning                                          Exercises      General Comments General comments (skin integrity, edema, etc.): NAD on RA      Pertinent Vitals/Pain Pain Assessment Pain Assessment: No/denies pain    Home Living                          Prior Function            PT Goals (current goals can now be found in the care plan section) Acute Rehab PT Goals Patient Stated Goal: Agreeable to ambulate PT Goal Formulation: With patient Time For Goal Achievement: 02/01/23 Potential to Achieve Goals: Good Progress towards PT goals: Progressing toward goals    Frequency    Min 1X/week      PT Plan      Co-evaluation  AM-PAC PT "6 Clicks" Mobility   Outcome Measure  Help needed turning from your back to your side while in a flat bed without using bedrails?: A Little Help needed moving from lying on your back to sitting on the side of a flat bed without using bedrails?: A Little Help needed moving to and from a bed to a chair (including a wheelchair)?: A Little Help needed standing up from a chair using your arms (e.g., wheelchair or bedside chair)?: A Little Help needed to walk in hospital room?: A Little Help needed climbing 3-5 steps with a railing? : A Lot 6 Click Score: 17    End of Session Equipment Utilized During Treatment: Gait belt Activity Tolerance: Patient tolerated treatment well Patient left: in chair;with call bell/phone within reach;with chair alarm set Nurse Communication: Mobility status PT Visit Diagnosis: Unsteadiness on feet (R26.81);Other abnormalities of gait  and mobility (R26.89);Muscle weakness (generalized) (M62.81)     Time: 8657-8469 PT Time Calculation (min) (ACUTE ONLY): 14 min  Charges:    $Gait Training: 8-22 mins PT General Charges $$ ACUTE PT VISIT: 1 Visit                     Helen Richardson, PT  Acute Rehabilitation Services Office [phone] Secure Chat welcomed    Levi Aland 01/19/2023, 12:48 PM

## 2023-01-19 NOTE — Telephone Encounter (Signed)
Pt is admitted to [facility] on 11/15 with  lactic acidosis, anemia .  Pt has appt with [facility] tomorrow. I told kimberly to call us back after pt is discharged.

## 2023-01-19 NOTE — Plan of Care (Signed)
  Problem: Skin Integrity: Goal: Risk for impaired skin integrity will decrease Outcome: Progressing   Problem: Clinical Measurements: Goal: Respiratory complications will improve Outcome: Progressing   Problem: Elimination: Goal: Will not experience complications related to bowel motility Outcome: Progressing

## 2023-01-19 NOTE — TOC Progression Note (Addendum)
Transition of Care [facility]) - Progression Note    Patient Details  Name: Helen Richardson MRN: [id number] Date of Birth: Jul 21, 1933  [facility] CM/SW Contact  Delilah Shan, LCSWA Phone Number: 01/19/2023, 1:56 PM  Clinical Narrative:     CSW spoke with patients son Darryl and provided SNF bed offers. Patients son Darryl accepted SNF bed offer with [facility]. [facility] confirmed SNF bed offer. CSW started insurance authorization for patient Berkley Harvey ID# [id number]. Insurance authorization currently pending.CSW will continue to follow and assist with patients dc planning needs.  Expected Discharge Plan: Skilled Nursing Facility Barriers to Discharge: Continued Medical Work up, English as a second language teacher, SNF Pending bed offer  Expected Discharge Plan and Services     Post Acute Care Choice: Skilled Nursing Facility Living arrangements for the past 2 months: Single Family Home                                       Social Determinants of Health (SDOH) Interventions SDOH Screenings   Food Insecurity: No Food Insecurity (01/17/2023)  Housing: Low Risk  (01/17/2023)  Transportation Needs: No Transportation Needs (01/17/2023)  Utilities: Not At Risk (01/17/2023)  Alcohol Screen: Low Risk  (10/18/2021)  Depression (PHQ2-9): Low Risk  (10/18/2021)  Financial Resource Strain: Low Risk  (10/18/2021)  Physical Activity: Inactive (10/18/2021)  Social Connections: Socially Isolated (10/18/2021)  Stress: No Stress Concern Present (10/18/2021)  Tobacco Use: Low Risk  (01/16/2023)    Readmission Risk Interventions     No data to display

## 2023-01-19 NOTE — Progress Notes (Signed)
Occupational Therapy Treatment Patient Details Name: Helen Richardson MRN: [id number] DOB: 1933/09/27 Today's Date: 01/19/2023   History of present illness Pt is an 89 y/o F presenting to ED on 11/15 from home with AMS, fatigue, and hypoglycemia, admitted for acute encephalopathy. PMH includes DM, CHF, HTN, high cholesterol.   OT comments  Pt progressing towards goals, needing to use restroom upon arrival, ambulates short distance to bathroom with RW min A, min A for bed mobility and overall needs CGA-min A for toileting and standing grooming tasks. Pt needs incr cues for sequencing/safety during session. Pt presenting with impairments listed below, will follow acutely. Due to decr support at home, updating d/c recommendation. Patient will benefit from continued inpatient follow up therapy, <3 hours/day to maximize safety/ind with ADLs/functional mobility.       If plan is discharge home, recommend the following:  A little help with walking and/or transfers;A lot of help with bathing/dressing/bathroom;Assistance with cooking/housework;Direct supervision/assist for medications management;Direct supervision/assist for financial management;Assist for transportation;Help with stairs or ramp for entrance;Supervision due to cognitive status   Equipment Recommendations  Other (comment) (defer)    Recommendations for Other Services PT consult    Precautions / Restrictions Precautions Precautions: Fall Restrictions Weight Bearing Restrictions: No       Mobility Bed Mobility Overal bed mobility: Needs Assistance Bed Mobility: Supine to Sit     Supine to sit: Min assist          Transfers Overall transfer level: Needs assistance Equipment used: Rolling walker (2 wheels) Transfers: Sit to/from Stand Sit to Stand: Min assist                 Balance Overall balance assessment: Needs assistance Sitting-balance support: Feet supported Sitting balance-[name]: Good      Standing balance support: During functional activity, Reliant on assistive device for balance Standing balance-[name]: Poor                             ADL either performed or assessed with clinical judgement   ADL Overall ADL's : Needs assistance/impaired     Grooming: Wash/dry hands;Set up;Standing Grooming Details (indicate cue type and reason): at sink                 Toilet Transfer: Minimal assistance;Ambulation;Regular Toilet;Rolling walker (2 wheels)   Toileting- Clothing Manipulation and Hygiene: Contact guard assist       Functional mobility during ADLs: Minimal assistance;Rolling walker (2 wheels)      Extremity/Trunk Assessment Upper Extremity Assessment Upper Extremity Assessment: Generalized weakness   Lower Extremity Assessment Lower Extremity Assessment: Defer to PT evaluation        Vision   Vision Assessment?: No apparent visual deficits   Perception Perception Perception: Not tested   Praxis Praxis Praxis: Not tested    Cognition Arousal: Alert Behavior During Therapy: WFL for tasks assessed/performed Overall Cognitive Status: No family/caregiver present to determine baseline cognitive functioning                                 General Comments: pt sleeping upon arrival, incr time to rouse and participates with min cues        Exercises      Shoulder Instructions       General Comments VSS    Pertinent Vitals/ Pain       Pain Assessment Pain  Assessment: No/denies pain  Home Living                                          Prior Functioning/Environment              Frequency  Min 1X/week        Progress Toward Goals  OT Goals(current goals can now be found in the care plan section)  Progress towards OT goals: Progressing toward goals  Acute Rehab OT Goals Patient Stated Goal: none stated OT Goal Formulation: With patient Time For Goal Achievement:  01/31/23 Potential to Achieve Goals: Good ADL Goals Pt Will Perform Upper Body Dressing: with supervision;sitting Pt Will Perform Lower Body Dressing: with supervision;sitting/lateral leans Pt Will Transfer to Toilet: with supervision;ambulating;regular height toilet Pt Will Perform Tub/Shower Transfer: with supervision;Shower transfer;Tub transfer;ambulating;shower seat Additional ADL Goal #1: pt will be able to stand x5 min for functional task in order ot improve activity tolerance for ADLs  Plan      Co-evaluation                 AM-PAC OT "6 Clicks" Daily Activity     Outcome Measure   Help from another person eating meals?: None Help from another person taking care of personal grooming?: A Little Help from another person toileting, which includes using toliet, bedpan, or urinal?: A Little Help from another person bathing (including washing, rinsing, drying)?: A Lot Help from another person to put on and taking off regular upper body clothing?: A Little Help from another person to put on and taking off regular lower body clothing?: A Lot 6 Click Score: 17    End of Session Equipment Utilized During Treatment: Gait belt;Rolling walker (2 wheels)  OT Visit Diagnosis: Unsteadiness on feet (R26.81);Other abnormalities of gait and mobility (R26.89);Muscle weakness (generalized) (M62.81)   Activity Tolerance Patient tolerated treatment well   Patient Left with call bell/phone within reach;in chair;with chair alarm set   Nurse Communication Mobility status        Time: 3295-1884 OT Time Calculation (min): 18 min  Charges: OT General Charges $OT Visit: 1 Visit OT Treatments $Self Care/Home Management : 8-22 mins  Carver Fila, OTD, OTR/L [facility] [phone]   Nicholette Dolson K Koonce 01/19/2023, 11:20 AM

## 2023-01-19 NOTE — Plan of Care (Signed)

## 2023-01-20 ENCOUNTER — Inpatient Hospital Stay: Payer: Medicare PPO

## 2023-01-20 ENCOUNTER — Inpatient Hospital Stay: Payer: Medicare PPO | Admitting: Internal Medicine

## 2023-01-20 DIAGNOSIS — G9340 Encephalopathy, unspecified: Secondary | ICD-10-CM | POA: Diagnosis not present

## 2023-01-20 LAB — GLUCOSE, CAPILLARY
Glucose-Capillary: 206 mg/dL — ABNORMAL HIGH (ref 70–99)
Glucose-Capillary: 226 mg/dL — ABNORMAL HIGH (ref 70–99)

## 2023-01-20 MED ORDER — PANTOPRAZOLE SODIUM 40 MG PO TBEC: 40.0000 mg | DELAYED_RELEASE_TABLET | Freq: Two times a day (BID) | ORAL | Status: DC

## 2023-01-20 MED ORDER — METFORMIN HCL ER 500 MG PO TB24
500.0000 mg | ORAL_TABLET | Freq: Every day | ORAL | Status: DC
Start: 2023-01-21 — End: 2023-01-20

## 2023-01-20 MED ORDER — METFORMIN HCL ER 500 MG PO TB24: 500.0000 mg | ORAL_TABLET | Freq: Every day | ORAL | Status: DC

## 2023-01-20 MED ORDER — POLYETHYLENE GLYCOL 3350 17 G PO PACK: 17.0000 g | PACK | Freq: Every day | ORAL | 0 refills | Status: DC

## 2023-01-20 NOTE — TOC Progression Note (Addendum)
Transition of Care [facility]) - Progression Note    Patient Details  Name: Helen Richardson MRN: [id number] Date of Birth: 1933/10/15  [facility] CM/SW Contact  Lorri Frederick, LCSW Phone Number: 01/20/2023, 8:57 AM  Clinical Narrative:   SNF auth approved: [id number], [id number], 3 days: 11/19-11/21.    CSW confirmed with [facility] that they can receive pt today.  MD notified.  1130: CSW spoke with pt son Darryl regarding transportation.  He can transport but will not be able to come until 3pm. RN informed.   Expected Discharge Plan: Skilled Nursing Facility Barriers to Discharge: Continued Medical Work up, English as a second language teacher, SNF Pending bed offer  Expected Discharge Plan and Services     Post Acute Care Choice: Skilled Nursing Facility Living arrangements for the past 2 months: Single Family Home                                       Social Determinants of Health (SDOH) Interventions SDOH Screenings   Food Insecurity: No Food Insecurity (01/17/2023)  Housing: Low Risk  (01/17/2023)  Transportation Needs: No Transportation Needs (01/17/2023)  Utilities: Not At Risk (01/17/2023)  Alcohol Screen: Low Risk  (10/18/2021)  Depression (PHQ2-9): Low Risk  (10/18/2021)  Financial Resource Strain: Low Risk  (10/18/2021)  Physical Activity: Inactive (10/18/2021)  Social Connections: Socially Isolated (10/18/2021)  Stress: No Stress Concern Present (10/18/2021)  Tobacco Use: Low Risk  (01/16/2023)    Readmission Risk Interventions     No data to display

## 2023-01-20 NOTE — Discharge Summary (Signed)
Physician Discharge Summary  Helen Richardson:[id number] DOB: Sep 10, 1933 DOA: 01/16/2023  PCP: No primary care provider on file.  Admit date: 01/16/2023 Discharge date: 01/20/2023  Admitted From: Home Disposition:  SNF  Discharge Condition:Stable CODE STATUS:FULL Diet recommendation:  Carb Modified   Brief/Interim Summary: Patient is a 89 year old female with history of hypertension, hyperlipidemia, diastolic CHF, chronic normocytic anemia, CKD stage III, insulin-dependent diabetes type 2 who presented with altered mental status from home.  She was confused on presentation.  She was found to be unresponsive while lying on the recliner, lethargic, disoriented on EMS arrival.  Blood sugars are noticed to be low in the range of 57.  On presentation, she was hemodynamically stable.  Lab work showed WBC count of 13.6, hemoglobin of 8.2, lactate of 2.9.  Chest x-ray did not show pneumonia.  Patient was admitted for the further evaluation of altered mental status.  CT head did not show any acute findings but showed atrophy, small-vessel disease and chronic lacunar infarcts.  EEG did not show any seizure however able to from discharge.  Currently she is alert and oriented.  PT/OT evaluation done recommended SNF on discharge.  Medically stable for discharge whenever possible.   Following problems were addressed during the hospitalization:   Acute encephalopathy: Unclear etiology.  Found to be confused, lethargic at home.  Could also be from hypoglycemia.  Hemodynamically stable. CT head did not show any acute findings but showed atrophy, small-vessel disease and chronic lacunar infarcts.  EEG did not show any seizure or epileptiform discharge.   Ammonia level normal. Encephalopathy has resolved.  Currently she remains alert and oriented.  Follows commands very well.  PT/OT consulted, SNF recommended   Normocytic anemia:   No evidence of acute blood loss.  Hemoglobin in the range of 7 -8 since last few  months.  Hemoglobin was in the range of 10 before that.    Patient was on aspirin at home.  No report of hematochezia or melena or change in the color of the stool.  Given a dose of IV iron.  Given unit of PRBC on 11/17.She follows with Dr. Arbutus Ped for this.  As per his notes,recently checked vitamin B12 and folic acid level were normal.  We recommend to follow-up with Dr. Arbutus Ped  as an outpatient. Her FOBT came out a positive.  Case discussed with GI Dr. Barron Alvine.  Due to her advanced age, GI does not want to proceed with any invasive investigation and recommend to continue follow-up with hematology, regular iron transfusion. We recommend to do a CBC test in a week to check her hemoglobin.  Hemoglobin stable in the range of 8 now.  Continue Protonix twice daily.  Aspirin will be discontinued.   SIRS: Presented with leukocytosis, tachycardia, lactic acidosis elevation..  Monitor off antibiotics.  Afebrile.  Chest x-ray did not show pneumonia.  UA not suspicious for UTI.  Leukocytosis improved   Insulin-dependent type 2 diabetes/hypoglycemia: Mildly hypoglycemic when EMS arrived.    A1c of 6.1.  Takes Evaristo Bury, Ozempic at home.  We will discontinue this medications.  Will start her on metformin 500 mg once daily.  She needs to closely monitor her blood sugars at SNF/home.  If insulin needs to be started in the future, needs to follow-up with PCP as an outpatient and discussed about it   CKD stage IIIa: Currently kidney at baseline   Diastolic CHF: Currently appears euvolemic.  Lasix discontinued   Hypertension: On ARB and beta-blocker.  Now on  hold due to soft blood pressure   Debility/deconditioning: PT/OT recommended SNF.  Patient lives alone.  TOC following      Discharge Diagnoses:  Principal Problem:   Acute encephalopathy Active Problems:   Type 2 diabetes mellitus with stage 3 chronic kidney disease, with long-term current use of insulin (HCC)   HTN (hypertension)   CKD stage 3a, GFR  45-59 ml/min (HCC)   SIRS (systemic inflammatory response syndrome) (HCC)   Chronic heart failure with preserved ejection fraction (HFpEF) (HCC)   Microcytic anemia    Discharge Instructions  Discharge Instructions     Diet Carb Modified   Complete by: As directed    Discharge instructions   Complete by: As directed    1)Please take your medications as instructed 2)Monitor your blood sugars 3)Follow up with your hematologist as an outpatient 4)Do a CBC test to check your hemoglobin in  a week   Increase activity slowly   Complete by: As directed       Allergies as of 01/20/2023       Reactions   Tradjenta [linagliptin] Swelling        Medication List     STOP taking these medications    Aspirin EC Adult Low Dose 81 MG tablet Generic drug: aspirin EC   carvedilol 6.25 MG tablet Commonly known as: COREG   furosemide 40 MG tablet Commonly known as: LASIX   Ozempic (1 MG/DOSE) 4 MG/3ML Sopn Generic drug: Semaglutide (1 MG/DOSE)   potassium chloride SA 20 MEQ tablet Commonly known as: KLOR-CON M   telmisartan 80 MG tablet Commonly known as: MICARDIS   Tresiba FlexTouch 200 UNIT/ML FlexTouch Pen Generic drug: insulin degludec       TAKE these medications    Calcium + Vitamin D3 600-5 MG-MCG Tabs Generic drug: Calcium Carb-Cholecalciferol Take 1 tablet by mouth daily.   FeroSul 325 (65 Fe) MG tablet Generic drug: ferrous sulfate Take 325 mg by mouth 2 (two) times daily.   metFORMIN 500 MG 24 hr tablet Commonly known as: GLUCOPHAGE-XR Take 1 tablet (500 mg total) by mouth daily with breakfast. Start taking on: January 21, 2023   pantoprazole 40 MG tablet Commonly known as: PROTONIX Take 1 tablet (40 mg total) by mouth 2 (two) times daily.   polyethylene glycol 17 g packet Commonly known as: MIRALAX / GLYCOLAX Take 17 g by mouth daily. Start taking on: January 21, 2023        Contact information for after-discharge care      Destination     [facility] Preferred SNF .   Service: Skilled Nursing Contact information: [address] [place] [address] [phone]                    Allergies  Allergen Reactions   Tradjenta [Linagliptin] Swelling    Consultations: None   Procedures/Studies: EEG adult  Result Date: 01/27/23 Charlsie Quest, MD     27-Jan-2023 10:54 AM Patient Name: Helen Richardson MRN: [id number] Epilepsy Attending: Charlsie Quest Referring Physician/Provider: Briscoe Deutscher, MD Date: 01-27-23 Duration: 24.59 mins Patient history: 89yo F with ams getting eeg to evaluate for seizure Level of alertness: Awake AEDs during EEG study: None Technical aspects: This EEG study was done with scalp electrodes positioned according to the 10-20 International system of electrode placement. Electrical activity was reviewed with band pass filter of 1-70Hz , sensitivity of 7 uV/mm, display speed of 40mm/sec with a 60Hz  notched  filter applied as appropriate. EEG data were recorded continuously and digitally stored.  Video monitoring was available and reviewed as appropriate. Description: The posterior dominant rhythm consists of 8 Hz activity of moderate voltage (25-35 uV) seen predominantly in posterior head regions, symmetric and reactive to eye opening and eye closing. EEG showed intermittent generalized 3 to 6 Hz theta-delta slowing. Hyperventilation and photic stimulation were not performed.   Of note, study was technically difficult due to significant myogenic artifact. ABNORMALITY - Intermittent slow, generalized IMPRESSION: This technically difficult study is suggestive of mild severe diffuse encephalopathy. No seizures or epileptiform discharges were seen throughout the recording. Priyanka Annabelle Harman   CT HEAD WO CONTRAST ( )  Result Date: 01/17/2023 CLINICAL DATA:  Sundowning and confusion. Mental status changes, unknown cause. EXAM: CT  HEAD WITHOUT CONTRAST TECHNIQUE: Contiguous axial images were obtained from the base of the skull through the vertex without intravenous contrast. RADIATION DOSE REDUCTION: This exam was performed according to the departmental dose-optimization program which includes automated exposure control, adjustment of the mA and/or kV according to patient size and/or use of iterative reconstruction technique. COMPARISON:  None Available. FINDINGS: Brain: There is mild-to-moderate cerebral atrophy, small-vessel disease and atrophic ventriculomegaly, with no the midline shift. The cerebellum and brainstem are normal in volume and attenuation except for a subcentimeter chronic lacunar infarct in the superior right cerebellar hemisphere. There are few bilateral tiny chronic gangliocapsular lacunar infarcts. Scattered benign dural calcifications along the falx. No asymmetry is seen worrisome for an acute cortical based infarct, hemorrhage, mass or mass effect. Basal cisterns are clear. Vascular: There are patchy calcifications in the carotid siphons. No hyperdense central vessel is seen. Skull: Negative for fractures or focal lesions. Sinuses/Orbits: Evidence of prior lens replacements. Otherwise negative orbits. There is mild membrane disease in the ethmoid air cells. Other paranasal sinuses, visualized mastoid air cells, and middle ear cavities are clear. Nasal septum deviates slightly to the left. Other: None. IMPRESSION: 1. No acute intracranial CT findings. 2. Atrophy, small-vessel disease and chronic lacunar infarcts. 3. Carotid atherosclerosis. 4. Mild ethmoid membrane disease. Electronically Signed   By: Almira Bar M.D.   On: 01/17/2023 01:51   DG Chest Portable 1 View  Result Date: 01/16/2023 CLINICAL DATA:  Weakness EXAM: PORTABLE CHEST 1 VIEW COMPARISON:  07/13/2021 FINDINGS: The heart size and mediastinal contours are within normal limits. Aortic atherosclerosis. Low lung volumes. No focal airspace  consolidation, pleural effusion, or pneumothorax. IMPRESSION: No active disease. Electronically Signed   By: Duanne Guess D.O.   On: 01/16/2023 21:10      Subjective: Patient seen and examined at bedside today.  Hemodynamically stable.  Pleasantly confused but comfortable, alert and awake and obeys commands well.  Denies any new complaints.  I called her son to discuss about discharge planning,call not received.  She is medically stable for discharge to SNF today  Discharge Exam: Vitals:   01/20/23 0456 01/20/23 0729  BP: (!) 129/54 96/79  Pulse: 84 87  Resp: 18 17  Temp: 98.3 F (36.8 C) 98 F (36.7 C)  SpO2: 97% 100%   Vitals:   01/19/23 1747 01/19/23 1949 01/20/23 0456 01/20/23 0729  BP: 112/76 (!) 138/52 (!) 129/54 96/79  Pulse: 82 86 84 87  Resp: 18 16 18 17   Temp: 98.2 F (36.8 C) 98.6 F (37 C) 98.3 F (36.8 C) 98 F (36.7 C)  TempSrc: Oral Oral    SpO2: 100% 100% 97% 100%  Weight:      Height:  General: Pt is alert, awake, not in acute distress, pleasant elderly female Cardiovascular: RRR, S1/S2 +, no rubs, no gallops Respiratory: CTA bilaterally, no wheezing, no rhonchi Abdominal: Soft, NT, ND, bowel sounds + Extremities: no edema, no cyanosis    The results of significant diagnostics from this hospitalization (including imaging, microbiology, ancillary and laboratory) are listed below for reference.     Microbiology: Recent Results (from the past 240 hour(s))  Blood culture (routine x 2)     Status: None (Preliminary result)   Collection Time: 01/17/23  5:14 AM   Specimen: BLOOD RIGHT ARM  Result Value Ref Range Status   Specimen Description BLOOD RIGHT ARM  Final   Special Requests   Final    BOTTLES DRAWN AEROBIC ONLY Blood Culture adequate volume   Culture   Final    NO GROWTH 3 DAYS Performed at [facility], [address], [place], [place] [address]    Report Status PENDING  Incomplete  Blood culture (routine x 2)     Status:  None (Preliminary result)   Collection Time: 01/17/23  5:16 AM   Specimen: BLOOD RIGHT HAND  Result Value Ref Range Status   Specimen Description BLOOD RIGHT HAND  Final   Special Requests   Final    BOTTLES DRAWN AEROBIC ONLY Blood Culture adequate volume   Culture   Final    NO GROWTH 3 DAYS Performed at [facility], [address] [place], [place] [address]    Report Status PENDING  Incomplete     Labs: BNP (last 3 results) No results for input(s): "BNP" in the last 8760 hours. Basic Metabolic Panel: Recent Labs  Lab 01/16/23 1851 01/17/23 0516 01/18/23 0440  NA 135 134* 136  K 5.0 4.5 4.0  CL 100 101 106  CO2 25 25 24   GLUCOSE 237* 166* 74  BUN 12 11 8   CREATININE 1.05* 0.89 0.94  CALCIUM 8.8* 8.7* 8.3*  MG  --  2.0  --   PHOS  --  2.8  --    Liver Function Tests: No results for input(s): "AST", "ALT", "ALKPHOS", "BILITOT", "PROT", "ALBUMIN" in the last 168 hours. No results for input(s): "LIPASE", "AMYLASE" in the last 168 hours. Recent Labs  Lab 01/17/23 0516  AMMONIA 21   CBC: Recent Labs  Lab 01/16/23 1851 01/17/23 0913 01/18/23 0440 01/18/23 1817 01/19/23 0442  WBC 13.6* 12.8* 11.0*  --  10.9*  HGB 8.2* 7.7* 7.0* 8.2* 8.2*  HCT 26.7* 25.3* 22.8* 26.3* 26.2*  MCV 79.0* 79.1* 78.9*  --  77.7*  PLT 552* 563* 424*  --  435*   Cardiac Enzymes: No results for input(s): "CKTOTAL", "CKMB", "CKMBINDEX", "TROPONINI" in the last 168 hours. BNP: Invalid input(s): "POCBNP" CBG: Recent Labs  Lab 01/19/23 0724 01/19/23 1127 01/19/23 1635 01/19/23 1952 01/20/23 0725  GLUCAP 158* 253* 248* 266* 206*   D-Dimer No results for input(s): "DDIMER" in the last 72 hours. Hgb A1c No results for input(s): "HGBA1C" in the last 72 hours. Lipid Profile No results for input(s): "CHOL", "HDL", "LDLCALC", "TRIG", "CHOLHDL", "LDLDIRECT" in the last 72 hours. Thyroid function studies No results for input(s): "TSH", "T4TOTAL", "T3FREE", "THYROIDAB" in the  last 72 hours.  Invalid input(s): "FREET3" Anemia work up No results for input(s): "VITAMINB12", "FOLATE", "FERRITIN", "TIBC", "IRON", "RETICCTPCT" in the last 72 hours. Urinalysis    Component Value Date/Time   COLORURINE YELLOW 01/17/2023 0913   APPEARANCEUR HAZY (A) 01/17/2023 0913   LABSPEC 1.011  01/17/2023 0913   PHURINE 7.0 01/17/2023 0913   GLUCOSEU NEGATIVE 01/17/2023 0913   HGBUR NEGATIVE 01/17/2023 0913   BILIRUBINUR NEGATIVE 01/17/2023 0913   KETONESUR NEGATIVE 01/17/2023 0913   PROTEINUR NEGATIVE 01/17/2023 0913   UROBILINOGEN 0.2 04/21/2009 1319   NITRITE NEGATIVE 01/17/2023 0913   LEUKOCYTESUR LARGE (A) 01/17/2023 0913   Sepsis Labs Recent Labs  Lab 01/16/23 1851 01/17/23 0913 01/18/23 0440 01/19/23 0442  WBC 13.6* 12.8* 11.0* 10.9*   Microbiology Recent Results (from the past 240 hour(s))  Blood culture (routine x 2)     Status: None (Preliminary result)   Collection Time: 01/17/23  5:14 AM   Specimen: BLOOD RIGHT ARM  Result Value Ref Range Status   Specimen Description BLOOD RIGHT ARM  Final   Special Requests   Final    BOTTLES DRAWN AEROBIC ONLY Blood Culture adequate volume   Culture   Final    NO GROWTH 3 DAYS Performed at [facility] [place] [facility], [address] [place], [place] [address]    Report Status PENDING  Incomplete  Blood culture (routine x 2)     Status: None (Preliminary result)   Collection Time: 01/17/23  5:16 AM   Specimen: BLOOD RIGHT HAND  Result Value Ref Range Status   Specimen Description BLOOD RIGHT HAND  Final   Special Requests   Final    BOTTLES DRAWN AEROBIC ONLY Blood Culture adequate volume   Culture   Final    NO GROWTH 3 DAYS Performed at [facility], [address], [place], [place] [address]    Report Status PENDING  Incomplete    Please note: You were cared for by a hospitalist during your hospital stay. Once you are discharged, your primary care physician will handle any further medical issues. Please  note that NO REFILLS for any discharge medications will be authorized once you are discharged, as it is imperative that you return to your primary care physician (or establish a relationship with a primary care physician if you do not have one) for your post hospital discharge needs so that they can reassess your need for medications and monitor your lab values.    Time coordinating discharge: 40 minutes  SIGNED:   Burnadette Pop, MD  [facility] 01/20/2023, 10:56 AM Pager [phone]  If 7PM-7AM, please contact night-coverage [email or web address] Password [email or web address]

## 2023-01-20 NOTE — TOC Transition Note (Signed)
Transition of Care [facility]) - CM/SW Discharge Note   Patient Details  Name: Helen Richardson MRN: [id number] Date of Birth: Jun 30, 1933  Transition of Care [facility]) CM/SW Contact:  Lorri Frederick, LCSW Phone Number: 01/20/2023, 11:38 AM   Clinical Narrative:  Pt discharging to [facility], room 106.  RN call report to [phone].  Pt son Rachael Fee will  transport pt to SNF.  He will be here between 3-330pm, will need pt brought down to main north tower entrance with assistance getting into the vehicle.     Final next level of care: Skilled Nursing Facility Barriers to Discharge: Barriers Resolved   Patient Goals and CMS Choice      Discharge Placement                Patient chooses bed at: [facility] Patient to be transferred to facility by: son Darryl Name of family member notified: son Darryl Patient and family notified of of transfer: 01/20/23  Discharge Plan and Services Additional resources added to the After Visit Summary for       Post Acute Care Choice: Skilled Nursing Facility                               Social Determinants of Health (SDOH) Interventions SDOH Screenings   Food Insecurity: No Food Insecurity (01/17/2023)  Housing: Low Risk  (01/17/2023)  Transportation Needs: No Transportation Needs (01/17/2023)  Utilities: Not At Risk (01/17/2023)  Alcohol Screen: Low Risk  (10/18/2021)  Depression (PHQ2-9): Low Risk  (10/18/2021)  Financial Resource Strain: Low Risk  (10/18/2021)  Physical Activity: Inactive (10/18/2021)  Social Connections: Socially Isolated (10/18/2021)  Stress: No Stress Concern Present (10/18/2021)  Tobacco Use: Low Risk  (01/16/2023)     Readmission Risk Interventions     No data to display

## 2023-01-20 NOTE — Progress Notes (Signed)
Mobility Specialist: Progress Note   01/20/23 1005  Mobility  Activity Ambulated with assistance in hallway  Level of Assistance Minimal assist, patient does 75% or more  Assistive Device Front wheel walker  Distance Ambulated (ft) 75 ft  Activity Response Tolerated well  Mobility Referral Yes  $Mobility charge 1 Mobility  Mobility Specialist Start Time (ACUTE ONLY) [id number]  Mobility Specialist Stop Time (ACUTE ONLY) 0958  Mobility Specialist Time Calculation (min) (ACUTE ONLY) 21 min    Pt was agreeable to mobility session - received in bed. No complaints. SV for bed mobility, minA for STS, minA for ambulation - CG for physical assistance but required min verbal cues for correcting posture and chair follow. Was fatigued; rolled back to the room via recliner chair. Left in chair with all needs met, call bell in reach. Chair alarm on.   Maurene Capes Mobility Specialist Please contact via SecureChat or Rehab office at [phone]

## 2023-01-22 LAB — CULTURE, BLOOD (ROUTINE X 2)
Culture: NO GROWTH
Culture: NO GROWTH
Special Requests: ADEQUATE
Special Requests: ADEQUATE

## 2023-02-16 ENCOUNTER — Ambulatory Visit: Payer: Medicare PPO | Admitting: Podiatry

## 2023-03-28 ENCOUNTER — Inpatient Hospital Stay (HOSPITAL_COMMUNITY)
Admission: EM | Admit: 2023-03-28 | Discharge: 2023-04-14 | DRG: 371 | Disposition: A | Discharge status: Skilled Nursing Facility | Payer: Medicare PPO | Attending: Internal Medicine | Admitting: Internal Medicine

## 2023-03-28 ENCOUNTER — Emergency Department (HOSPITAL_COMMUNITY): Payer: Medicare PPO

## 2023-03-28 DIAGNOSIS — J9601 Acute respiratory failure with hypoxia: Secondary | ICD-10-CM | POA: Diagnosis not present

## 2023-03-28 DIAGNOSIS — J9811 Atelectasis: Secondary | ICD-10-CM | POA: Diagnosis present

## 2023-03-28 DIAGNOSIS — Z6827 Body mass index (BMI) 27.0-27.9, adult: Secondary | ICD-10-CM

## 2023-03-28 DIAGNOSIS — D630 Anemia in neoplastic disease: Secondary | ICD-10-CM | POA: Diagnosis present

## 2023-03-28 DIAGNOSIS — K651 Peritoneal abscess: Principal | ICD-10-CM | POA: Diagnosis present

## 2023-03-28 DIAGNOSIS — E1122 Type 2 diabetes mellitus with diabetic chronic kidney disease: Secondary | ICD-10-CM | POA: Diagnosis present

## 2023-03-28 DIAGNOSIS — Z7982 Long term (current) use of aspirin: Secondary | ICD-10-CM

## 2023-03-28 DIAGNOSIS — R7881 Bacteremia: Secondary | ICD-10-CM

## 2023-03-28 DIAGNOSIS — E8889 Other specified metabolic disorders: Secondary | ICD-10-CM | POA: Diagnosis present

## 2023-03-28 DIAGNOSIS — E1165 Type 2 diabetes mellitus with hyperglycemia: Secondary | ICD-10-CM | POA: Diagnosis present

## 2023-03-28 DIAGNOSIS — Z79899 Other long term (current) drug therapy: Secondary | ICD-10-CM

## 2023-03-28 DIAGNOSIS — Z823 Family history of stroke: Secondary | ICD-10-CM

## 2023-03-28 DIAGNOSIS — Z8249 Family history of ischemic heart disease and other diseases of the circulatory system: Secondary | ICD-10-CM

## 2023-03-28 DIAGNOSIS — J90 Pleural effusion, not elsewhere classified: Secondary | ICD-10-CM | POA: Diagnosis present

## 2023-03-28 DIAGNOSIS — Z66 Do not resuscitate: Secondary | ICD-10-CM | POA: Diagnosis present

## 2023-03-28 DIAGNOSIS — Z888 Allergy status to other drugs, medicaments and biological substances status: Secondary | ICD-10-CM

## 2023-03-28 DIAGNOSIS — E7800 Pure hypercholesterolemia, unspecified: Secondary | ICD-10-CM | POA: Diagnosis present

## 2023-03-28 DIAGNOSIS — B9689 Other specified bacterial agents as the cause of diseases classified elsewhere: Secondary | ICD-10-CM | POA: Diagnosis present

## 2023-03-28 DIAGNOSIS — B952 Enterococcus as the cause of diseases classified elsewhere: Secondary | ICD-10-CM | POA: Diagnosis present

## 2023-03-28 DIAGNOSIS — Z7189 Other specified counseling: Secondary | ICD-10-CM

## 2023-03-28 DIAGNOSIS — Z9841 Cataract extraction status, right eye: Secondary | ICD-10-CM

## 2023-03-28 DIAGNOSIS — Z515 Encounter for palliative care: Secondary | ICD-10-CM

## 2023-03-28 DIAGNOSIS — K6389 Other specified diseases of intestine: Secondary | ICD-10-CM | POA: Diagnosis not present

## 2023-03-28 DIAGNOSIS — E669 Obesity, unspecified: Secondary | ICD-10-CM | POA: Diagnosis present

## 2023-03-28 DIAGNOSIS — N1831 Chronic kidney disease, stage 3a: Secondary | ICD-10-CM | POA: Diagnosis present

## 2023-03-28 DIAGNOSIS — E222 Syndrome of inappropriate secretion of antidiuretic hormone: Secondary | ICD-10-CM | POA: Diagnosis not present

## 2023-03-28 DIAGNOSIS — K219 Gastro-esophageal reflux disease without esophagitis: Secondary | ICD-10-CM | POA: Diagnosis present

## 2023-03-28 DIAGNOSIS — I5032 Chronic diastolic (congestive) heart failure: Secondary | ICD-10-CM | POA: Diagnosis present

## 2023-03-28 DIAGNOSIS — R531 Weakness: Secondary | ICD-10-CM

## 2023-03-28 DIAGNOSIS — G9341 Metabolic encephalopathy: Secondary | ICD-10-CM | POA: Diagnosis present

## 2023-03-28 DIAGNOSIS — B9620 Unspecified Escherichia coli [E. coli] as the cause of diseases classified elsewhere: Secondary | ICD-10-CM | POA: Diagnosis present

## 2023-03-28 DIAGNOSIS — R4589 Other symptoms and signs involving emotional state: Secondary | ICD-10-CM

## 2023-03-28 DIAGNOSIS — R188 Other ascites: Secondary | ICD-10-CM | POA: Diagnosis present

## 2023-03-28 DIAGNOSIS — F05 Delirium due to known physiological condition: Secondary | ICD-10-CM | POA: Diagnosis not present

## 2023-03-28 DIAGNOSIS — I130 Hypertensive heart and chronic kidney disease with heart failure and stage 1 through stage 4 chronic kidney disease, or unspecified chronic kidney disease: Secondary | ICD-10-CM | POA: Diagnosis present

## 2023-03-28 DIAGNOSIS — B954 Other streptococcus as the cause of diseases classified elsewhere: Secondary | ICD-10-CM | POA: Diagnosis present

## 2023-03-28 DIAGNOSIS — Z7984 Long term (current) use of oral hypoglycemic drugs: Secondary | ICD-10-CM

## 2023-03-28 DIAGNOSIS — Z833 Family history of diabetes mellitus: Secondary | ICD-10-CM

## 2023-03-28 DIAGNOSIS — E876 Hypokalemia: Secondary | ICD-10-CM | POA: Diagnosis not present

## 2023-03-28 DIAGNOSIS — F0390 Unspecified dementia without behavioral disturbance: Secondary | ICD-10-CM | POA: Diagnosis present

## 2023-03-28 DIAGNOSIS — Z9842 Cataract extraction status, left eye: Secondary | ICD-10-CM

## 2023-03-28 LAB — I-STAT CHEM 8, ED
BUN: 29 mg/dL — ABNORMAL HIGH (ref 8–23)
Calcium, Ion: 1.11 mmol/L — ABNORMAL LOW (ref 1.15–1.40)
Chloride: 90 mmol/L — ABNORMAL LOW (ref 98–111)
Creatinine, Ser: 1.2 mg/dL — ABNORMAL HIGH (ref 0.44–1.00)
Glucose, Bld: 647 mg/dL (ref 70–99)
HCT: 29 % — ABNORMAL LOW (ref 36.0–46.0)
Hemoglobin: 9.9 g/dL — ABNORMAL LOW (ref 12.0–15.0)
Potassium: 5.6 mmol/L — ABNORMAL HIGH (ref 3.5–5.1)
Sodium: 125 mmol/L — ABNORMAL LOW (ref 135–145)
TCO2: 28 mmol/L (ref 22–32)

## 2023-03-28 LAB — CBC WITH DIFFERENTIAL/PLATELET
Abs Immature Granulocytes: 0.13 10*3/uL — ABNORMAL HIGH (ref 0.00–0.07)
Basophils Absolute: 0.1 10*3/uL (ref 0.0–0.1)
Basophils Relative: 0 %
Eosinophils Absolute: 0 10*3/uL (ref 0.0–0.5)
Eosinophils Relative: 0 %
HCT: 27.1 % — ABNORMAL LOW (ref 36.0–46.0)
Hemoglobin: 8.8 g/dL — ABNORMAL LOW (ref 12.0–15.0)
Immature Granulocytes: 1 %
Lymphocytes Relative: 8 %
Lymphs Abs: 1.4 10*3/uL (ref 0.7–4.0)
MCH: 24.5 pg — ABNORMAL LOW (ref 26.0–34.0)
MCHC: 32.5 g/dL (ref 30.0–36.0)
MCV: 75.5 fL — ABNORMAL LOW (ref 80.0–100.0)
Monocytes Absolute: 1.3 10*3/uL — ABNORMAL HIGH (ref 0.1–1.0)
Monocytes Relative: 7 %
Neutro Abs: 15.1 10*3/uL — ABNORMAL HIGH (ref 1.7–7.7)
Neutrophils Relative %: 84 %
Platelets: 430 10*3/uL — ABNORMAL HIGH (ref 150–400)
RBC: 3.59 MIL/uL — ABNORMAL LOW (ref 3.87–5.11)
RDW: 18 % — ABNORMAL HIGH (ref 11.5–15.5)
WBC: 18 10*3/uL — ABNORMAL HIGH (ref 4.0–10.5)
nRBC: 0 % (ref 0.0–0.2)

## 2023-03-28 LAB — URINALYSIS, W/ REFLEX TO CULTURE (INFECTION SUSPECTED)
Bilirubin Urine: NEGATIVE
Glucose, UA: >=500 mg/dL — AB
Hgb urine dipstick: NEGATIVE
Ketones, ur: NEGATIVE mg/dL
Nitrite: NEGATIVE
Protein, ur: NEGATIVE mg/dL
Specific Gravity, Urine: 1.023 (ref 1.005–1.030)
pH: 5 (ref 5.0–8.0)

## 2023-03-28 LAB — BLOOD GAS, VENOUS
Acid-Base Excess: 6.2 mmol/L — ABNORMAL HIGH (ref 0.0–2.0)
Bicarbonate: 31.7 mmol/L — ABNORMAL HIGH (ref 20.0–28.0)
O2 Saturation: 25.1 %
Patient temperature: 37
pCO2, Ven: 50 mm[Hg] (ref 44–60)
pH, Ven: 7.41 (ref 7.25–7.43)
pO2, Ven: <31 mm[Hg] — CL (ref 32–45)

## 2023-03-28 LAB — COMPREHENSIVE METABOLIC PANEL
ALT: 12 U/L (ref 0–44)
AST: 14 U/L — ABNORMAL LOW (ref 15–41)
Albumin: 2.8 g/dL — ABNORMAL LOW (ref 3.5–5.0)
Alkaline Phosphatase: 71 U/L (ref 38–126)
Anion gap: 11 (ref 5–15)
BUN: 25 mg/dL — ABNORMAL HIGH (ref 8–23)
CO2: 26 mmol/L (ref 22–32)
Calcium: 8.8 mg/dL — ABNORMAL LOW (ref 8.9–10.3)
Chloride: 88 mmol/L — ABNORMAL LOW (ref 98–111)
Creatinine, Ser: 1.11 mg/dL — ABNORMAL HIGH (ref 0.44–1.00)
GFR, Estimated: 48 mL/min — ABNORMAL LOW (ref >60)
Glucose, Bld: 629 mg/dL (ref 70–99)
Potassium: 5.1 mmol/L (ref 3.5–5.1)
Sodium: 125 mmol/L — ABNORMAL LOW (ref 135–145)
Total Bilirubin: 1 mg/dL (ref 0.0–1.2)
Total Protein: 7.2 g/dL (ref 6.5–8.1)

## 2023-03-28 LAB — CBG MONITORING, ED
Glucose-Capillary: 529 mg/dL (ref 70–99)
Glucose-Capillary: 495 mg/dL — ABNORMAL HIGH (ref 70–99)
Glucose-Capillary: 420 mg/dL — ABNORMAL HIGH (ref 70–99)
Glucose-Capillary: 335 mg/dL — ABNORMAL HIGH (ref 70–99)
Glucose-Capillary: 407 mg/dL — ABNORMAL HIGH (ref 70–99)

## 2023-03-28 LAB — LIPASE, BLOOD: Lipase: 29 U/L (ref 11–51)

## 2023-03-28 LAB — BETA-HYDROXYBUTYRIC ACID: Beta-Hydroxybutyric Acid: 0.68 mmol/L — ABNORMAL HIGH (ref 0.05–0.27)

## 2023-03-28 LAB — I-STAT CG4 LACTIC ACID, ED: Lactic Acid, Venous: 1.1 mmol/L (ref 0.5–1.9)

## 2023-03-28 MED ORDER — SODIUM CHLORIDE 0.9 % IV SOLN
2.0000 g | Freq: Once | INTRAVENOUS | Status: AC
  Administered 2023-03-28: 2 g via INTRAVENOUS
  Filled 2023-03-28: qty 20

## 2023-03-28 MED ORDER — LACTATED RINGERS IV BOLUS
1000.0000 mL | Freq: Once | INTRAVENOUS | Status: AC
  Administered 2023-03-28: 1000 mL via INTRAVENOUS

## 2023-03-28 MED ORDER — IOHEXOL 300 MG/ML  SOLN
75.0000 mL | Freq: Once | INTRAMUSCULAR | Status: AC | PRN
  Administered 2023-03-28: 75 mL via INTRAVENOUS

## 2023-03-28 MED ORDER — INSULIN ASPART 100 UNIT/ML IJ SOLN
6.0000 [IU] | Freq: Once | INTRAMUSCULAR | Status: AC
  Administered 2023-03-28: 6 [IU] via INTRAVENOUS
  Filled 2023-03-28: qty 0.06

## 2023-03-28 MED ORDER — LACTATED RINGERS IV SOLN: INTRAVENOUS | Status: DC

## 2023-03-28 MED ORDER — DEXTROSE 50 % IV SOLN: 0.0000 mL | INTRAVENOUS | Status: DC | PRN

## 2023-03-28 MED ORDER — DEXTROSE IN LACTATED RINGERS 5 % IV SOLN: INTRAVENOUS | Status: DC

## 2023-03-28 MED ORDER — SODIUM CHLORIDE 0.9 % IV SOLN: 2.0000 g | Freq: Two times a day (BID) | INTRAVENOUS | Status: DC

## 2023-03-28 MED ORDER — VANCOMYCIN HCL IN DEXTROSE 1-5 GM/200ML-% IV SOLN
1000.0000 mg | Freq: Once | INTRAVENOUS | Status: DC
Start: 2023-03-29 — End: 2023-03-29
  Administered 2023-03-29: 1000 mg via INTRAVENOUS
  Filled 2023-03-28: qty 200

## 2023-03-28 MED ORDER — INSULIN REGULAR(HUMAN) IN NACL 100-0.9 UT/100ML-% IV SOLN
INTRAVENOUS | Status: DC
Start: 2023-03-28 — End: 2023-03-29
  Administered 2023-03-28: 11.5 [IU]/h via INTRAVENOUS
  Filled 2023-03-28: qty 100

## 2023-03-28 NOTE — Progress Notes (Signed)
Pharmacy Antibiotic Note  disreguard  Helen Richardson March 03/28/2023 11:49 PM

## 2023-03-28 NOTE — ED Triage Notes (Signed)
Per EMS from home. Hyperglycemia. Blood sugar high this morning and has not come down with insulin. Pt c/o right flank pain.  CBG High BP 125/75 HR 94 O2 92 RA

## 2023-03-28 NOTE — ED Provider Notes (Signed)
[place] EMERGENCY DEPARTMENT AT [facility] Provider Note   CSN: [id number] Arrival date & time: 03/28/23  2440     History {Add pertinent medical, surgical, social history, OB history to HPI:1} Chief Complaint  Patient presents with  . Hyperglycemia    Helen Richardson is a 89 y.o. female.  HPI       Past Medical History:  Diagnosis Date  . CHF (congestive heart failure) (HCC)   . Diabetes mellitus without complication (HCC)   . High cholesterol   . HTN (hypertension)   . Obesity     Home Medications Prior to Admission medications   Medication Sig Start Date End Date Taking? Authorizing Provider  CALCIUM + VITAMIN D3 600-5 MG-MCG TABS Take 1 tablet by mouth daily.    [provider]  FEROSUL 325 (65 Fe) MG tablet Take 325 mg by mouth 2 (two) times daily.    [provider]  metFORMIN (GLUCOPHAGE-XR) 500 MG 24 hr tablet Take 1 tablet (500 mg total) by mouth daily with breakfast. 01/21/23   Burnadette Pop, MD  pantoprazole (PROTONIX) 40 MG tablet Take 1 tablet (40 mg total) by mouth 2 (two) times daily. 01/20/23   Burnadette Pop, MD  polyethylene glycol (MIRALAX / GLYCOLAX) 17 g packet Take 17 g by mouth daily. 01/21/23   Burnadette Pop, MD      Allergies    Tradjenta [linagliptin]    Review of Systems   Review of Systems  Physical Exam Updated Vital Signs BP (!) 158/84   Pulse 92   Temp 98.2 F (36.8 C) (Oral)   Resp (!) 22   SpO2 97%  Physical Exam  ED Results / Procedures / Treatments   Labs (all labs ordered are listed, but only abnormal results are displayed) Labs Reviewed  CBC WITH DIFFERENTIAL/PLATELET - Abnormal; Notable for the following components:      Result Value   WBC 18.0 (*)    RBC 3.59 (*)    Hemoglobin 8.8 (*)    HCT 27.1 (*)    MCV 75.5 (*)    MCH 24.5 (*)    RDW 18.0 (*)    Platelets 430 (*)    Neutro Abs 15.1 (*)    Monocytes Absolute 1.3 (*)    Abs Immature Granulocytes 0.13 (*)    All  other components within normal limits  COMPREHENSIVE METABOLIC PANEL - Abnormal; Notable for the following components:   Sodium 125 (*)    Chloride 88 (*)    Glucose, Bld 629 (*)    BUN 25 (*)    Creatinine, Ser 1.11 (*)    Calcium 8.8 (*)    Albumin 2.8 (*)    AST 14 (*)    GFR, Estimated 48 (*)    All other components within normal limits  BETA-HYDROXYBUTYRIC ACID - Abnormal; Notable for the following components:   Beta-Hydroxybutyric Acid 0.68 (*)    All other components within normal limits  BLOOD GAS, VENOUS - Abnormal; Notable for the following components:   pO2, Ven <31 (*)    Bicarbonate 31.7 (*)    Acid-Base Excess 6.2 (*)    All other components within normal limits  CBG MONITORING, ED - Abnormal; Notable for the following components:   Glucose-Capillary 529 (*)    All other components within normal limits  I-STAT CHEM 8, ED - Abnormal; Notable for the following components:   Sodium 125 (*)    Potassium 5.6 (*)    Chloride 90 (*)  BUN 29 (*)    Creatinine, Ser 1.20 (*)    Glucose, Bld 647 (*)    Calcium, Ion 1.11 (*)    Hemoglobin 9.9 (*)    HCT 29.0 (*)    All other components within normal limits  CBG MONITORING, ED - Abnormal; Notable for the following components:   Glucose-Capillary 495 (*)    All other components within normal limits  LIPASE, BLOOD  URINALYSIS, W/ REFLEX TO CULTURE (INFECTION SUSPECTED)    EKG None  Radiology No results found.  Procedures Procedures  {Document cardiac monitor, telemetry assessment procedure when appropriate:1}  Medications Ordered in ED Medications  lactated ringers bolus 1,000 mL (0 mLs Intravenous Stopped 03/28/23 1923)    ED Course/ Medical Decision Making/ A&P   {   Click here for ABCD2, HEART and other calculatorsREFRESH Note before signing :1}                              Medical Decision Making Amount and/or Complexity of Data Reviewed Labs: ordered.   ***  {Document critical care time when  appropriate:1} {Document review of labs and clinical decision tools ie heart score, Chads2Vasc2 etc:1}  {Document your independent review of radiology images, and any outside records:1} {Document your discussion with family members, caretakers, and with consultants:1} {Document social determinants of health affecting pt's care:1} {Document your decision making why or why not admission, treatments were needed:1} Final Clinical Impression(s) / ED Diagnoses Final diagnoses:  None    Rx / DC Orders ED Discharge Orders     None

## 2023-03-28 NOTE — ED Notes (Signed)
Chem 8: Glucose 647 RN and Provider notified

## 2023-03-28 NOTE — ED Notes (Signed)
Pt in CT.

## 2023-03-29 ENCOUNTER — Inpatient Hospital Stay (HOSPITAL_COMMUNITY): Payer: Medicare PPO

## 2023-03-29 ENCOUNTER — Encounter (HOSPITAL_COMMUNITY): Payer: Self-pay | Admitting: Internal Medicine

## 2023-03-29 DIAGNOSIS — J9811 Atelectasis: Secondary | ICD-10-CM | POA: Diagnosis present

## 2023-03-29 DIAGNOSIS — F0390 Unspecified dementia without behavioral disturbance: Secondary | ICD-10-CM | POA: Diagnosis present

## 2023-03-29 DIAGNOSIS — D630 Anemia in neoplastic disease: Secondary | ICD-10-CM | POA: Diagnosis present

## 2023-03-29 DIAGNOSIS — J90 Pleural effusion, not elsewhere classified: Secondary | ICD-10-CM | POA: Diagnosis present

## 2023-03-29 DIAGNOSIS — R4589 Other symptoms and signs involving emotional state: Secondary | ICD-10-CM | POA: Diagnosis not present

## 2023-03-29 DIAGNOSIS — R188 Other ascites: Secondary | ICD-10-CM | POA: Diagnosis present

## 2023-03-29 DIAGNOSIS — E1165 Type 2 diabetes mellitus with hyperglycemia: Secondary | ICD-10-CM | POA: Diagnosis present

## 2023-03-29 DIAGNOSIS — R7881 Bacteremia: Secondary | ICD-10-CM | POA: Diagnosis present

## 2023-03-29 DIAGNOSIS — J9601 Acute respiratory failure with hypoxia: Secondary | ICD-10-CM | POA: Diagnosis not present

## 2023-03-29 DIAGNOSIS — E669 Obesity, unspecified: Secondary | ICD-10-CM | POA: Diagnosis present

## 2023-03-29 DIAGNOSIS — K651 Peritoneal abscess: Secondary | ICD-10-CM | POA: Diagnosis present

## 2023-03-29 DIAGNOSIS — B952 Enterococcus as the cause of diseases classified elsewhere: Secondary | ICD-10-CM | POA: Diagnosis present

## 2023-03-29 DIAGNOSIS — Z7189 Other specified counseling: Secondary | ICD-10-CM | POA: Diagnosis not present

## 2023-03-29 DIAGNOSIS — R531 Weakness: Secondary | ICD-10-CM | POA: Diagnosis not present

## 2023-03-29 DIAGNOSIS — E222 Syndrome of inappropriate secretion of antidiuretic hormone: Secondary | ICD-10-CM | POA: Diagnosis not present

## 2023-03-29 DIAGNOSIS — F05 Delirium due to known physiological condition: Secondary | ICD-10-CM | POA: Diagnosis not present

## 2023-03-29 DIAGNOSIS — G9341 Metabolic encephalopathy: Secondary | ICD-10-CM | POA: Diagnosis present

## 2023-03-29 DIAGNOSIS — R739 Hyperglycemia, unspecified: Secondary | ICD-10-CM | POA: Diagnosis not present

## 2023-03-29 DIAGNOSIS — B9620 Unspecified Escherichia coli [E. coli] as the cause of diseases classified elsewhere: Secondary | ICD-10-CM | POA: Diagnosis present

## 2023-03-29 DIAGNOSIS — B954 Other streptococcus as the cause of diseases classified elsewhere: Secondary | ICD-10-CM | POA: Diagnosis present

## 2023-03-29 DIAGNOSIS — Z515 Encounter for palliative care: Secondary | ICD-10-CM | POA: Diagnosis not present

## 2023-03-29 DIAGNOSIS — K6389 Other specified diseases of intestine: Secondary | ICD-10-CM | POA: Diagnosis present

## 2023-03-29 DIAGNOSIS — Z66 Do not resuscitate: Secondary | ICD-10-CM | POA: Diagnosis present

## 2023-03-29 DIAGNOSIS — E8889 Other specified metabolic disorders: Secondary | ICD-10-CM | POA: Diagnosis present

## 2023-03-29 DIAGNOSIS — I5032 Chronic diastolic (congestive) heart failure: Secondary | ICD-10-CM | POA: Diagnosis present

## 2023-03-29 DIAGNOSIS — I130 Hypertensive heart and chronic kidney disease with heart failure and stage 1 through stage 4 chronic kidney disease, or unspecified chronic kidney disease: Secondary | ICD-10-CM | POA: Diagnosis present

## 2023-03-29 DIAGNOSIS — E1122 Type 2 diabetes mellitus with diabetic chronic kidney disease: Secondary | ICD-10-CM | POA: Diagnosis present

## 2023-03-29 DIAGNOSIS — N1831 Chronic kidney disease, stage 3a: Secondary | ICD-10-CM | POA: Diagnosis present

## 2023-03-29 DIAGNOSIS — B9689 Other specified bacterial agents as the cause of diseases classified elsewhere: Secondary | ICD-10-CM | POA: Diagnosis present

## 2023-03-29 DIAGNOSIS — F03C0 Unspecified dementia, severe, without behavioral disturbance, psychotic disturbance, mood disturbance, and anxiety: Secondary | ICD-10-CM | POA: Diagnosis not present

## 2023-03-29 LAB — COMPREHENSIVE METABOLIC PANEL
ALT: 11 U/L (ref 0–44)
AST: 15 U/L (ref 15–41)
Albumin: 2.4 g/dL — ABNORMAL LOW (ref 3.5–5.0)
Alkaline Phosphatase: 63 U/L (ref 38–126)
Anion gap: 10 (ref 5–15)
BUN: 19 mg/dL (ref 8–23)
CO2: 26 mmol/L (ref 22–32)
Calcium: 8.9 mg/dL (ref 8.9–10.3)
Chloride: 96 mmol/L — ABNORMAL LOW (ref 98–111)
Creatinine, Ser: 0.75 mg/dL (ref 0.44–1.00)
GFR, Estimated: >60 mL/min (ref >60)
Glucose, Bld: 254 mg/dL — ABNORMAL HIGH (ref 70–99)
Potassium: 4.4 mmol/L (ref 3.5–5.1)
Sodium: 132 mmol/L — ABNORMAL LOW (ref 135–145)
Total Bilirubin: 0.7 mg/dL (ref 0.0–1.2)
Total Protein: 6.6 g/dL (ref 6.5–8.1)

## 2023-03-29 LAB — GLUCOSE, CAPILLARY
Glucose-Capillary: 123 mg/dL — ABNORMAL HIGH (ref 70–99)
Glucose-Capillary: 63 mg/dL — ABNORMAL LOW (ref 70–99)

## 2023-03-29 LAB — HEMOGLOBIN A1C
Hgb A1c MFr Bld: 11.7 % — ABNORMAL HIGH (ref 4.8–5.6)
Mean Plasma Glucose: 289.09 mg/dL

## 2023-03-29 LAB — CBC
HCT: 27 % — ABNORMAL LOW (ref 36.0–46.0)
Hemoglobin: 8.4 g/dL — ABNORMAL LOW (ref 12.0–15.0)
MCH: 23.7 pg — ABNORMAL LOW (ref 26.0–34.0)
MCHC: 31.1 g/dL (ref 30.0–36.0)
MCV: 76.1 fL — ABNORMAL LOW (ref 80.0–100.0)
Platelets: 432 10*3/uL — ABNORMAL HIGH (ref 150–400)
RBC: 3.55 MIL/uL — ABNORMAL LOW (ref 3.87–5.11)
RDW: 18.2 % — ABNORMAL HIGH (ref 11.5–15.5)
WBC: 19.7 10*3/uL — ABNORMAL HIGH (ref 4.0–10.5)
nRBC: 0 % (ref 0.0–0.2)

## 2023-03-29 LAB — VITAMIN B12: Vitamin B-12: 1099 pg/mL — ABNORMAL HIGH (ref 180–914)

## 2023-03-29 LAB — PROTIME-INR
INR: 1.2 (ref 0.8–1.2)
Prothrombin Time: 15.9 s — ABNORMAL HIGH (ref 11.4–15.2)

## 2023-03-29 LAB — TSH: TSH: 0.667 u[IU]/mL (ref 0.350–4.500)

## 2023-03-29 LAB — CBG MONITORING, ED
Glucose-Capillary: 209 mg/dL — ABNORMAL HIGH (ref 70–99)
Glucose-Capillary: 235 mg/dL — ABNORMAL HIGH (ref 70–99)
Glucose-Capillary: 281 mg/dL — ABNORMAL HIGH (ref 70–99)
Glucose-Capillary: 291 mg/dL — ABNORMAL HIGH (ref 70–99)

## 2023-03-29 LAB — AMMONIA: Ammonia: 13 umol/L (ref 9–35)

## 2023-03-29 LAB — FOLATE: Folate: 4.9 ng/mL — ABNORMAL LOW (ref >5.9)

## 2023-03-29 MED ORDER — ONDANSETRON HCL 4 MG PO TABS: 4.0000 mg | ORAL_TABLET | Freq: Four times a day (QID) | ORAL | Status: DC | PRN

## 2023-03-29 MED ORDER — INSULIN GLARGINE-YFGN 100 UNIT/ML ~~LOC~~ SOLN
15.0000 [IU] | Freq: Every day | SUBCUTANEOUS | Status: DC
  Administered 2023-03-29 – 2023-04-05 (×9): 15 [IU] via SUBCUTANEOUS
  Filled 2023-03-29 (×11): qty 0.15

## 2023-03-29 MED ORDER — ACETAMINOPHEN 325 MG PO TABS
650.0000 mg | ORAL_TABLET | Freq: Four times a day (QID) | ORAL | Status: DC | PRN
Start: 2023-03-29 — End: 2023-04-14
  Administered 2023-03-31 – 2023-04-05 (×7): 650 mg via ORAL
  Filled 2023-03-29 (×9): qty 2

## 2023-03-29 MED ORDER — SODIUM CHLORIDE 0.9 % IV SOLN: INTRAVENOUS | Status: DC

## 2023-03-29 MED ORDER — INSULIN ASPART 100 UNIT/ML IJ SOLN
0.0000 [IU] | INTRAMUSCULAR | Status: DC
Start: 2023-03-29 — End: 2023-03-29
  Filled 2023-03-29: qty 0.24

## 2023-03-29 MED ORDER — OXYCODONE HCL 5 MG PO TABS
5.0000 mg | ORAL_TABLET | ORAL | Status: DC | PRN
  Administered 2023-03-29 – 2023-04-05 (×8): 5 mg via ORAL
  Filled 2023-03-29 (×8): qty 1

## 2023-03-29 MED ORDER — ENOXAPARIN SODIUM 40 MG/0.4ML IJ SOSY
40.0000 mg | PREFILLED_SYRINGE | INTRAMUSCULAR | Status: DC
  Filled 2023-03-29 (×2): qty 0.4

## 2023-03-29 MED ORDER — DEXTROSE-SODIUM CHLORIDE 5-0.9 % IV SOLN: INTRAVENOUS | Status: DC

## 2023-03-29 MED ORDER — MIDAZOLAM HCL 2 MG/2ML IJ SOLN
INTRAMUSCULAR | Status: AC
Start: 2023-03-29 — End: ?
  Filled 2023-03-29: qty 2

## 2023-03-29 MED ORDER — PANTOPRAZOLE SODIUM 40 MG PO TBEC
40.0000 mg | DELAYED_RELEASE_TABLET | Freq: Two times a day (BID) | ORAL | Status: DC
Start: 2023-03-29 — End: 2023-04-14
  Administered 2023-03-29 – 2023-04-14 (×33): 40 mg via ORAL
  Filled 2023-03-29 (×34): qty 1

## 2023-03-29 MED ORDER — ACETAMINOPHEN 650 MG RE SUPP: 650.0000 mg | Freq: Four times a day (QID) | RECTAL | Status: DC | PRN

## 2023-03-29 MED ORDER — FENTANYL CITRATE (PF) 100 MCG/2ML IJ SOLN
INTRAMUSCULAR | Status: AC
  Filled 2023-03-29: qty 2

## 2023-03-29 MED ORDER — ONDANSETRON HCL 4 MG/2ML IJ SOLN: 4.0000 mg | Freq: Four times a day (QID) | INTRAMUSCULAR | Status: DC | PRN

## 2023-03-29 MED ORDER — DEXTROSE 50 % IV SOLN
12.5000 g | INTRAVENOUS | Status: AC
  Administered 2023-03-29: 12.5 g via INTRAVENOUS
  Filled 2023-03-29: qty 50

## 2023-03-29 MED ORDER — INSULIN ASPART 100 UNIT/ML IJ SOLN
0.0000 [IU] | Freq: Three times a day (TID) | INTRAMUSCULAR | Status: DC
  Administered 2023-03-29: 3 [IU] via SUBCUTANEOUS
  Administered 2023-03-29: 5 [IU] via SUBCUTANEOUS
  Administered 2023-03-29: 1 [IU] via SUBCUTANEOUS
  Administered 2023-03-30 (×2): 2 [IU] via SUBCUTANEOUS
  Administered 2023-04-01: 3 [IU] via SUBCUTANEOUS
  Administered 2023-04-01: 1 [IU] via SUBCUTANEOUS
  Administered 2023-04-01: 3 [IU] via SUBCUTANEOUS
  Administered 2023-04-02 – 2023-04-03 (×3): 1 [IU] via SUBCUTANEOUS
  Administered 2023-04-04: 2 [IU] via SUBCUTANEOUS
  Administered 2023-04-04: 3 [IU] via SUBCUTANEOUS
  Administered 2023-04-04 – 2023-04-05 (×2): 2 [IU] via SUBCUTANEOUS
  Administered 2023-04-05: 3 [IU] via SUBCUTANEOUS
  Administered 2023-04-05 – 2023-04-11 (×6): 2 [IU] via SUBCUTANEOUS
  Administered 2023-04-11: 3 [IU] via SUBCUTANEOUS
  Administered 2023-04-11: 1 [IU] via SUBCUTANEOUS
  Administered 2023-04-12: 2 [IU] via SUBCUTANEOUS
  Administered 2023-04-13: 1 [IU] via SUBCUTANEOUS
  Administered 2023-04-13 – 2023-04-14 (×2): 2 [IU] via SUBCUTANEOUS
  Administered 2023-04-14: 3 [IU] via SUBCUTANEOUS
  Filled 2023-03-29: qty 0.09

## 2023-03-29 MED ORDER — INSULIN ASPART 100 UNIT/ML IJ SOLN
15.0000 [IU] | Freq: Once | INTRAMUSCULAR | Status: DC
  Filled 2023-03-29: qty 0.15

## 2023-03-29 NOTE — ED Notes (Signed)
Patient transported to CT

## 2023-03-29 NOTE — ED Notes (Signed)
Helen Richardson. NP made aware in pt's fluctuating BP and change in mental status/ pt is alert but appears to be more altered than earlier/ family is not present/ pt Is able to tell her name but not DOB, (like she could earlier)/ pt is disoriented to time and situation as well.

## 2023-03-29 NOTE — Progress Notes (Signed)
     Patient Name: Helen Richardson           DOB: 01-07-1934  MRN: [id number]      Admission Date: 03/28/2023  Attending Provider: Fran Lowes, DO  Primary Diagnosis: Intraabdominal fluid collection   Level of care: Med-Surg   OVERNIGHT PROGRESS REPORT  RN concerned with altered mental status.  At bedside, patient is alert and pleasantly confused.  Oriented to self only.  Symmetrical weakness in upper/ lower extremities.  Follows simple commands.  No focal deficits present.  PERRLA, clear speech. Family not available at this time to provide more information regarding patient's mentation/behavior. Patient is afebrile, glucose 209, negative urinalysis.  Leukocytosis with large colon mass and intra-abdominal fluid collection, concerning for tumor versus metastatic disease.  Plan: Nursing staff to continue reorienting patient.  Labs-vitamin B12, folate, thiamine, ammonia, TSH      Anthoney Harada, DNP, [employer] [facility] [place]

## 2023-03-29 NOTE — Progress Notes (Addendum)
PROGRESS NOTE  Helen Richardson XBJ:[id number] DOB: Aug 13, 1933 DOA: 03/28/2023 PCP: System, Provider Not In  Brief History   The patient is a 89 yr old woman who presented to [facility] ED on 03/28/2023 with complaints of right sided flank pain. The patient had had a previous admission in November 2024 for AMS after being found unresponsive. She was found to be hypoglycemic on that occasion. She gradually improved, and was discharged to outpatient follow-up. The patient states that she developed right sided flank pain and high glucoses in the past few days. Upon presentation her glucose was 629. [name] Kitchen She also had a WBC of 18, Hgb 8.8. and sodium 125. CT abdomen and pelvis which demonstrated a circumferential mass in the distal ascending colon near the hepatic flexure measuring 7 cm. There is also a enhancing complex collection in the right perirenal space measuring 6 cm with concern for tumor or metastatic disease. Interventional radiology was consulted in the emergency department for possible drain placement versus biopsy. Dr. Deanne Coffer was contacted. She was placed on insulin and started on antibiotics.   The patient has been evaluated by IR. As the patient herself is not capable of expressing herself, following commands, or making her own decisions. Her son was at bedside. The procedure's benefits and risks were  discussed with the family. Ultimately no drain was placed as the family did not give consent. Palliative care has been consulted.   On 03/30/2023 the patient's WBC has declined slightly to 19.1. She is eating well upon my visit. No new complaints. IR was able to discuss procedure, risks and benefits with family as the patient was not able to to consent as she is not oriented. The family has consented to the procedure. She will be NPO after midnight to undergo drain placement and/or biopsy with IR on 03/31/2023.  A & P  Intraabdominal fluid collection with colon mass: IR has been able to obtain consent from  family for drain placement/biopsy on 03/31/2023. The patient has been made NPO after midnight.   Flank pain likely related to malignancy Patient presented with abdominal pain found to have a large colon mass with adjacent fluid collection Likely related to metastatic disease or locally advanced colon cancer No prior colonoscopy Patient able to tolerate p.o. and endorses normal stools Palliative care has been consulted. Oncology consult once tissue diagnosis is made. WBC has declined from 19.1 to 15.8. The patient is receiving oxycodone for pain. She has used it once since it was started on 03/29/2023.   Hyperglycemia in setting of poorly controlled type 2 diabetes Patient showed ketosis and hyperglycemia upon admission. However, no acidosis so concern for DKA is low. Blood sugars greater than 600 on presentation, but 63 later the day of admission. Pt on Tresiba 6 units daily prior to presentation. On admission the patient was placed on 15 units of Semglee daily with SSI. Blood sugars 115 - 153 in the last 24 hours. HbA1c is 11.7.  Dementia Moderately advanced. The patient is not situationally aware. She does not comprehend her medical situation and is unable to provide consent for procedures. IR has obtained consent from the patient's family for procedure tomorrow.  Bacteremia 1/2 bottles positive for e fecaelis. The patient is receiving IV ampicillin. Susceptibilities are pending. Will repeat cultures. WBC has declined from 19.1 to 15.8.  HTN The patient is normotensive without antihypertensives. Monitor.  GERD Continue Protonix  I have seen and examined this patient myself. I have spent 34 minutes in her evaluation

## 2023-03-29 NOTE — Consult Note (Addendum)
Chief Complaint: Patient was seen in consultation today for abdominal pain  at the request of Alan Mulder, MD  Referring Physician(s): Dorrell, Molly Maduro MD.  Supervising Physician: Oley Balm  Patient Status: [facility] - In-pt  Full Code  History of Present Illness: Helen Richardson is a 89 y.o. female with history of CHF, Diabetes mellitus, high cholesterol, HTN, and obesity. Patient presented to the ER via EMS 03/28/23 with hyperglycemia and c/o right flank pain. CT abdomen/pelvis 03/28/23 reported: circumferential heterogenous mass in the distal ascending colon near the hepatic flexure measuring up to 7.1 cm, which was concerning for neoplasm. There was another peripherally enhancing complex collection in the right perirenal space lateral to the kidney measuring up to 6.1 cm. This may be extension of tumor, metastatic disease or abscess. IR was consulted due to the fluid collection for possible drain vs biopsy.   Patient is alert and can follow commands. However, she is not oriented. Patient understands her name, but cannot tell me her DOB, the current year, or her current location. Patient's son is present at bedside. He reports a recent history of his mother having to bend over recently, however he was not aware she had pain. ROS unable to be obtained secondary to altered mental status.   Past Medical History:  Diagnosis Date   CHF (congestive heart failure) (HCC)    Diabetes mellitus without complication (HCC)    High cholesterol    HTN (hypertension)    Obesity     Past Surgical History:  Procedure Laterality Date   CATARACT EXTRACTION, BILATERAL     ingrown toenail Left    left great toenail   PARTIAL HYSTERECTOMY      Allergies: Tradjenta [linagliptin]  Medications: Prior to Admission medications   Medication Sig Start Date End Date Taking? Authorizing Provider  CALCIUM + VITAMIN D3 600-5 MG-MCG TABS Take 1 tablet by mouth daily.    [provider]   FEROSUL 325 (65 Fe) MG tablet Take 325 mg by mouth 2 (two) times daily.    [provider]  metFORMIN (GLUCOPHAGE-XR) 500 MG 24 hr tablet Take 1 tablet (500 mg total) by mouth daily with breakfast. 01/21/23   Burnadette Pop, MD  pantoprazole (PROTONIX) 40 MG tablet Take 1 tablet (40 mg total) by mouth 2 (two) times daily. 01/20/23   Burnadette Pop, MD  polyethylene glycol (MIRALAX / GLYCOLAX) 17 g packet Take 17 g by mouth daily. 01/21/23   Burnadette Pop, MD     Family History  Problem Relation Age of Onset   Diabetes Mother    Hypertension Mother    Hypertension Father    Stroke Father     Social History   Socioeconomic History   Marital status: Widowed    Spouse name: Not on file   Number of children: Not on file   Years of education: Not on file   Highest education level: Not on file  Occupational History   Occupation: retired  Tobacco Use   Smoking status: Never   Smokeless tobacco: Never  Vaping Use   Vaping status: Never Used  Substance and Sexual Activity   Alcohol use: No   Drug use: No   Sexual activity: Not Currently  Other Topics Concern   Not on file  Social History Narrative   Not on file   Social Drivers of Health   Financial Resource Strain: Low Risk  (10/18/2021)   Overall Financial Resource Strain (CARDIA)    Difficulty of Paying Living  Expenses: Not hard at all  Food Insecurity: No Food Insecurity (01/17/2023)   Hunger Vital Sign    Worried About Running Out of Food in the Last Year: Never true    Ran Out of Food in the Last Year: Never true  Transportation Needs: No Transportation Needs (01/17/2023)   PRAPARE - Administrator, Civil Service (Medical): No    Lack of Transportation (Non-Medical): No  Physical Activity: Inactive (10/18/2021)   Exercise Vital Sign    Days of Exercise per Week: 0 days    Minutes of Exercise per Session: 0 min  Stress: No Stress Concern Present (10/18/2021)   [employer] of Occupational  Health - Occupational Stress Questionnaire    Feeling of Stress : Not at all  Social Connections: Socially Isolated (10/18/2021)   Social Connection and Isolation Panel [NHANES]    Frequency of Communication with Friends and Family: More than three times a week    Frequency of Social Gatherings with Friends and Family: More than three times a week    Attends Religious Services: Never    Database administrator or Organizations: No    Attends Banker Meetings: Never    Marital Status: Widowed    Review of Systems: A 12 point ROS discussed and pertinent positives are indicated in the HPI above.  All other systems are negative.  Review of Systems  Unable to perform ROS: Mental status change    Vital Signs: BP 119/60 (BP Location: Left Arm)   Pulse 92   Temp 98.3 F (36.8 C) (Oral)   Resp (!) 27   Wt 167 lb 12.3 oz (76.1 kg)   SpO2 100%   BMI 27.08 kg/m   Advance Care Plan: The advanced care plan/surrogate decision maker was discussed at the time of visit and documented in the medical record.    Physical Exam HENT:     Head: Normocephalic.     Mouth/Throat:     Mouth: Mucous membranes are moist.     Pharynx: Oropharynx is clear.  Cardiovascular:     Rate and Rhythm: Regular rhythm. Tachycardia present.  Pulmonary:     Effort: Pulmonary effort is normal. No respiratory distress.     Breath sounds: Normal breath sounds.  Abdominal:     Palpations: Abdomen is soft.     Tenderness: There is no abdominal tenderness.  Skin:    General: Skin is warm.  Neurological:     General: No focal deficit present.     Mental Status: She is alert. She is disoriented.     Imaging: CT ABDOMEN PELVIS W CONTRAST Result Date: 03/28/2023 CLINICAL DATA:  Right lower quadrant pain EXAM: CT ABDOMEN AND PELVIS WITH CONTRAST TECHNIQUE: Multidetector CT imaging of the abdomen and pelvis was performed using the standard protocol following bolus administration of intravenous contrast.  RADIATION DOSE REDUCTION: This exam was performed according to the departmental dose-optimization program which includes automated exposure control, adjustment of the mA and/or kV according to patient size and/or use of iterative reconstruction technique. CONTRAST:  75mL OMNIPAQUE IOHEXOL 300 MG/ML  SOLN COMPARISON:  CT abdomen and pelvis 05/14/2018 FINDINGS: Lower chest: No acute abnormality. Hepatobiliary: No focal liver abnormality is seen. No gallstones, gallbladder wall thickening, or biliary dilatation. Pancreas: Unremarkable. No pancreatic ductal dilatation or surrounding inflammatory changes. Spleen: Normal in size without focal abnormality. Adrenals/Urinary Tract: There is a peripherally enhancing low-attenuation area in the right perirenal space lateral to the kidney with internal septations measuring  2.8 x 4.6 x 6.1 cm. Otherwise, the bilateral kidneys, adrenal glands, and bladder are within normal limits. Stomach/Bowel: Circumferential heterogeneous mass is seen in the distal ascending colon near the hepatic flexure measuring proximally 7.1 by 6.5 by 5.5 cm. There surrounding inflammatory stranding. This mass abuts the peripherally enhancing complex collection abutting the right kidney. There is no bowel obstruction, pneumatosis or free air. The appendix is within normal limits. Sigmoid colon diverticula are present. Small bowel and stomach are within normal limits. Vascular/Lymphatic: Aortic atherosclerosis. No enlarged abdominal or pelvic lymph nodes. Reproductive: Status post hysterectomy. No adnexal masses. Other: No abdominal wall hernia or abnormality. No abdominopelvic ascites. Musculoskeletal: Degenerative changes affect the spine. No fracture or suspicious osseous lesion. IMPRESSION: 1. Circumferential heterogeneous mass in the distal ascending colon near the hepatic flexure measuring up to 7.1 cm. Findings are worrisome for colon neoplasm. 2. Peripherally enhancing complex collection in the  right perirenal space lateral to the kidney measuring up to 6.1 cm. This abuts the colonic mass. Findings may represent direct extension of tumor/necrotic tumor, metastatic disease or abscess. 3. Sigmoid colon diverticulosis. 4. Aortic atherosclerosis. Aortic Atherosclerosis (ICD10-I70.0). Electronically Signed   By: Darliss Cheney M.D.   On: 03/28/2023 22:46    Labs:  CBC: Recent Labs    01/18/23 0440 01/18/23 1817 01/19/23 0442 03/28/23 1641 03/28/23 1649 03/29/23 0401  WBC 11.0*  --  10.9* 18.0*  --  19.7*  HGB 7.0*   < > 8.2* 8.8* 9.9* 8.4*  HCT 22.8*   < > 26.2* 27.1* 29.0* 27.0*  PLT 424*  --  435* 430*  --  432*   < > = values in this interval not displayed.    COAGS: Recent Labs    03/29/23 0850  INR 1.2    BMP: Recent Labs    01/17/23 0516 01/18/23 0440 03/28/23 1641 03/28/23 1649 03/29/23 0401  NA 134* 136 125* 125* 132*  K 4.5 4.0 5.1 5.6* 4.4  CL 101 106 88* 90* 96*  CO2 25 24 26   --  26  GLUCOSE 166* 74 629* 647* 254*  BUN 11 8 25* 29* 19  CALCIUM 8.7* 8.3* 8.8*  --  8.9  CREATININE 0.89 0.94 1.11* 1.20* 0.75  GFRNONAA >60 59* 48*  --  >60    LIVER FUNCTION TESTS: Recent Labs    11/19/22 1255 03/28/23 1641 03/29/23 0401  BILITOT 0.4 1.0 0.7  AST 11* 14* 15  ALT 6 12 11   ALKPHOS 51 71 63  PROT 6.6 7.2 6.6  ALBUMIN 3.3* 2.8* 2.4*    TUMOR MARKERS: No results for input(s): "AFPTM", "CEA", "CA199", "CHROMGRNA" in the last 8760 hours.  Assessment and Plan: Helen Richardson is a 89 y.o. female with history of CHF, Diabetes mellitus, high cholesterol, HTN, and obesity. Patient presented to the ER via EMS 03/28/23 with hyperglycemia and c/o right flank pain. CT abdomen/pelvis 03/28/23 reported: circumferential heterogenous mass in the distal ascending colon near the hepatic flexure measuring up to 7.1 cm, which was concerning for neoplasm. There was another peripherally enhancing complex collection in the right perirenal space lateral to the kidney  measuring up to 6.1 cm. This may be extension of tumor, metastatic disease or abscess. IR was consulted due to the fluid collection for possible drain vs biopsy.   Patient is alert and can follow commands. However, she is not oriented. Patient understands her name, but cannot tell me her DOB, the current year, or her current location. Patient's son is  present at bedside. He reports a recent history of his mother having to bend over recently, however he was not aware she had pain. ROS unable to be obtained secondary to altered mental status.   Risks and benefits discussed with the patient and family including bleeding, infection, damage to adjacent structures, bowel perforation/fistula connection, and sepsis.  All of the patient's questions were answered, patient is agreeable to proceed. Consent signed by the patient's son.   Patient is NPO for midnight. Lovenox on hold. Patient's last dose of aspirin was 03/28/23.  Thank you for this interesting consult.  I greatly enjoyed meeting Helen Richardson and look forward to participating in their care.  A copy of this report was sent to the requesting provider on this date.  Electronically Signed: Rosalita Levan, PA 03/29/2023, 12:41 PM   I spent a total of 20 Minutes    in face to face in clinical consultation, greater than 50% of which was counseling/coordinating care for abdominal pain.

## 2023-03-29 NOTE — ED Provider Notes (Signed)
  Physical Exam  BP (!) 171/102   Pulse 91   Temp 98.3 F (36.8 C) (Oral)   Resp 19   Wt 76.1 kg   SpO2 100%   BMI 27.08 kg/m   Physical Exam Vitals and nursing note reviewed.  Constitutional:      General: She is not in acute distress.    Appearance: She is well-developed.  HENT:     Head: Normocephalic and atraumatic.  Eyes:     Conjunctiva/sclera: Conjunctivae normal.  Cardiovascular:     Rate and Rhythm: Normal rate and regular rhythm.     Heart sounds: No murmur heard. Pulmonary:     Effort: Pulmonary effort is normal. No respiratory distress.     Breath sounds: Normal breath sounds.  Abdominal:     Palpations: Abdomen is soft.     Tenderness: There is no abdominal tenderness.  Musculoskeletal:        General: No swelling.     Cervical back: Neck supple.  Skin:    General: Skin is warm and dry.     Capillary Refill: Capillary refill takes less than 2 seconds.  Neurological:     Mental Status: She is alert.  Psychiatric:        Mood and Affect: Mood normal.     Procedures  Procedures  ED Course / MDM    Medical Decision Making Amount and/or Complexity of Data Reviewed Labs: ordered. Radiology: ordered.  Risk Prescription drug management. Decision regarding hospitalization.   Patient received in handoff.  Hyperglycemia pending CT.  CT concerning for new large colonic neoplasm as well as a perirenal fluid collection concerning for possible abscess.  Spoke with Dr. Deanne Coffer of interventional radiology and he will have the interventional radiology team evaluate the patient in the morning.  Broad-spectrum antibiotics initiated.  Patient is hyperglycemic on a insulin drip and require hospital admission.  Patient admitted       Glendora Score, MD 03/29/23 [id number]

## 2023-03-29 NOTE — ED Notes (Signed)
Patient back to ED bed 12 from CT scan. No drain placed per CT, family did not give consent. JRPRN

## 2023-03-29 NOTE — H&P (Signed)
History and Physical    Helen Richardson ZOX:[id number] DOB: 1933-12-12 DOA: 03/28/2023  PCP: System, Provider Not In   Chief Complaint:  flank pain, hyperglycemia  HPI: Helen Richardson is a 89 y.o. female with medical history significant of CHF, type 2 diabetes, hyperlipidemia, hypertension presented emergency department due to right sided flank pain.  Patient was hospitalized in November for altered mental status.  At that time she was found to be unresponsive and confused.  She was hypoglycemic.  She was brought to the ER and was admitted for further workup.  Workup was largely unrevealing and she returned to her baseline mental status.  She was eventually discharged outpatient follow-up.  She seemed to be doing well however she developed right flank pain and difficulty controlling her blood sugar.  She presented to the emergency department where she was found to be afebrile and hemodynamically stable.  Labs were obtained which demonstrated glucose 529, WBC 18.0, hemoglobin 8.8, platelets 430, sodium 125, glucose 629, creatinine 1.1 at baseline, AST 14, ALT 12, beta hydroxybutyrate 0.68, pH 7.41, urinalysis negative for infection.  Lactic acid 1.1.  Patient underwent CT abdomen pelvis due to her pain which demonstrated circumferential mass in the distal ascending colon near the hepatic flexure measuring 7 cm.  There is also a enhancing complex collection in the right perirenal space measuring 6 cm with concern for tumor or metastatic disease.  Interventional radiology was consulted in the emergency department for possible drain placement versus biopsy.  Dr. Deanne Coffer was contacted.  She was placed on insulin and started on antibiotics.   Review of Systems: Review of Systems  Constitutional:  Negative for chills and fever.  HENT: Negative.    Eyes: Negative.   Respiratory: Negative.    Cardiovascular: Negative.   Gastrointestinal:  Positive for abdominal pain.  Genitourinary: Negative.    Musculoskeletal: Negative.   Skin: Negative.   Neurological: Negative.   Endo/Heme/Allergies: Negative.   Psychiatric/Behavioral: Negative.    All other systems reviewed and are negative.    As per HPI otherwise 10 point review of systems negative.   Allergies  Allergen Reactions   Tradjenta [Linagliptin] Swelling    Past Medical History:  Diagnosis Date   CHF (congestive heart failure) (HCC)    Diabetes mellitus without complication (HCC)    High cholesterol    HTN (hypertension)    Obesity     Past Surgical History:  Procedure Laterality Date   CATARACT EXTRACTION, BILATERAL     ingrown toenail Left    left great toenail   PARTIAL HYSTERECTOMY       reports that she has never smoked. She has never used smokeless tobacco. She reports that she does not drink alcohol and does not use drugs.  Family History  Problem Relation Age of Onset   Diabetes Mother    Hypertension Mother    Hypertension Father    Stroke Father     Prior to Admission medications   Medication Sig Start Date End Date Taking? Authorizing Provider  CALCIUM + VITAMIN D3 600-5 MG-MCG TABS Take 1 tablet by mouth daily.    [provider]  FEROSUL 325 (65 Fe) MG tablet Take 325 mg by mouth 2 (two) times daily.    [provider]  metFORMIN (GLUCOPHAGE-XR) 500 MG 24 hr tablet Take 1 tablet (500 mg total) by mouth daily with breakfast. 01/21/23   Burnadette Pop, MD  pantoprazole (PROTONIX) 40 MG tablet Take 1 tablet (40 mg total) by mouth  2 (two) times daily. 01/20/23   Burnadette Pop, MD  polyethylene glycol (MIRALAX / GLYCOLAX) 17 g packet Take 17 g by mouth daily. 01/21/23   Burnadette Pop, MD    Physical Exam: Vitals:   03/28/23 1621 03/28/23 1920 03/28/23 2200 03/28/23 2318  BP: 124/73 (!) 158/84 (!) 171/102   Pulse: 99 92 91   Resp: 18 (!) 22 19   Temp: 98.3 F (36.8 C) 98.2 F (36.8 C)  98.3 F (36.8 C)  TempSrc:  Oral  Oral  SpO2: 92% 97% 100%   Weight:    76.1  kg   Physical Exam Constitutional:      Appearance: She is normal weight.  HENT:     Head: Normocephalic.     Nose: Nose normal.     Mouth/Throat:     Mouth: Mucous membranes are moist.     Pharynx: Oropharynx is clear.  Eyes:     Pupils: Pupils are equal, round, and reactive to light.  Cardiovascular:     Rate and Rhythm: Normal rate and regular rhythm.     Heart sounds: Normal heart sounds.  Pulmonary:     Effort: Pulmonary effort is normal.     Breath sounds: Normal breath sounds.  Abdominal:     General: Abdomen is flat. Bowel sounds are normal.  Musculoskeletal:        General: Normal range of motion.     Cervical back: Normal range of motion.  Skin:    General: Skin is warm.     Capillary Refill: Capillary refill takes less than 2 seconds.  Neurological:     General: No focal deficit present.     Mental Status: She is alert.  Psychiatric:        Mood and Affect: Mood normal.       Labs on Admission: I have personally reviewed the patients's labs and imaging studies.  Assessment/Plan Principal Problem:   Intraabdominal fluid collection   # Flank pain likely related to malignancy - Patient presented with abdominal pain found to have a large colon mass with adjacent fluid collection - Likely related to metastatic disease or locally advanced colon cancer -No prior colonoscopy -Patient able to tolerate p.o. and endorses normal stools  Plan: IR consulted, appreciate recommendations regarding biopsy versus drain Consider oncology consultation in the morning as patient may not be a candidate for chemotherapy/major surgery and may not need biopsy As needed pain medications  # Hyperglycemia in setting of poorly controlled type 2 diabetes - Patient shows ketosis and hyperglycemia however no acidosis so concern for DKA is low - Blood sugars greater than 600 on presentation  Plan: Start Semglee 15 units daily Sliding scale ordered 4 hours Check A1c  #  GERD-continue Protonix  Admission status: Inpatient Med-Surg  Certification: The appropriate patient status for this patient is INPATIENT. Inpatient status is judged to be reasonable and necessary in order to provide the required intensity of service to ensure the patient's safety. The patient's presenting symptoms, physical exam findings, and initial radiographic and laboratory data in the context of their chronic comorbidities is felt to place them at high risk for further clinical deterioration. Furthermore, it is not anticipated that the patient will be medically stable for discharge from the hospital within 2 midnights of admission.   * I certify that at the point of admission it is my clinical judgment that the patient will require inpatient hospital care spanning beyond 2 midnights from the point of admission due to  high intensity of service, high risk for further deterioration and high frequency of surveillance required.Alan Mulder MD [facility] If 7PM-7AM, please contact night-coverage [email or web address]  03/29/2023, 12:36 AM

## 2023-03-30 DIAGNOSIS — R188 Other ascites: Secondary | ICD-10-CM | POA: Diagnosis not present

## 2023-03-30 LAB — BLOOD CULTURE ID PANEL (REFLEXED) - BCID2

## 2023-03-30 LAB — CBC WITH DIFFERENTIAL/PLATELET
Abs Immature Granulocytes: 0.17 10*3/uL — ABNORMAL HIGH (ref 0.00–0.07)
Basophils Absolute: 0.1 10*3/uL (ref 0.0–0.1)
Basophils Relative: 0 %
Eosinophils Absolute: 0.1 10*3/uL (ref 0.0–0.5)
Eosinophils Relative: 1 %
HCT: 27.9 % — ABNORMAL LOW (ref 36.0–46.0)
Hemoglobin: 8.5 g/dL — ABNORMAL LOW (ref 12.0–15.0)
Immature Granulocytes: 1 %
Lymphocytes Relative: 9 %
Lymphs Abs: 1.6 10*3/uL (ref 0.7–4.0)
MCH: 23.7 pg — ABNORMAL LOW (ref 26.0–34.0)
MCHC: 30.5 g/dL (ref 30.0–36.0)
MCV: 77.9 fL — ABNORMAL LOW (ref 80.0–100.0)
Monocytes Absolute: 1.8 10*3/uL — ABNORMAL HIGH (ref 0.1–1.0)
Monocytes Relative: 10 %
Neutro Abs: 15.4 10*3/uL — ABNORMAL HIGH (ref 1.7–7.7)
Neutrophils Relative %: 79 %
Platelets: 393 10*3/uL (ref 150–400)
RBC: 3.58 MIL/uL — ABNORMAL LOW (ref 3.87–5.11)
RDW: 18.5 % — ABNORMAL HIGH (ref 11.5–15.5)
WBC: 19.1 10*3/uL — ABNORMAL HIGH (ref 4.0–10.5)
nRBC: 0 % (ref 0.0–0.2)

## 2023-03-30 LAB — BASIC METABOLIC PANEL
Anion gap: 9 (ref 5–15)
BUN: 14 mg/dL (ref 8–23)
CO2: 25 mmol/L (ref 22–32)
Calcium: 8.6 mg/dL — ABNORMAL LOW (ref 8.9–10.3)
Chloride: 100 mmol/L (ref 98–111)
Creatinine, Ser: 0.9 mg/dL (ref 0.44–1.00)
GFR, Estimated: >60 mL/min (ref >60)
Glucose, Bld: 137 mg/dL — ABNORMAL HIGH (ref 70–99)
Potassium: 4.4 mmol/L (ref 3.5–5.1)
Sodium: 134 mmol/L — ABNORMAL LOW (ref 135–145)

## 2023-03-30 LAB — GLUCOSE, CAPILLARY
Glucose-Capillary: 113 mg/dL — ABNORMAL HIGH (ref 70–99)
Glucose-Capillary: 132 mg/dL — ABNORMAL HIGH (ref 70–99)
Glucose-Capillary: 148 mg/dL — ABNORMAL HIGH (ref 70–99)
Glucose-Capillary: 153 mg/dL — ABNORMAL HIGH (ref 70–99)
Glucose-Capillary: 187 mg/dL — ABNORMAL HIGH (ref 70–99)
Glucose-Capillary: 319 mg/dL — ABNORMAL HIGH (ref 70–99)

## 2023-03-30 MED ORDER — DEXTROSE-SODIUM CHLORIDE 5-0.9 % IV SOLN: INTRAVENOUS | Status: DC

## 2023-03-30 MED ORDER — SODIUM CHLORIDE 0.9 % IV SOLN
2.0000 g | Freq: Four times a day (QID) | INTRAVENOUS | Status: DC
  Administered 2023-03-31 (×3): 2 g via INTRAVENOUS
  Filled 2023-03-30 (×4): qty 2000

## 2023-03-30 MED ORDER — INSULIN ASPART 100 UNIT/ML IJ SOLN
3.0000 [IU] | Freq: Once | INTRAMUSCULAR | Status: AC
  Administered 2023-03-30: 3 [IU] via SUBCUTANEOUS

## 2023-03-30 MED ORDER — INSULIN ASPART 100 UNIT/ML IJ SOLN
0.0000 [IU] | INTRAMUSCULAR | Status: AC
  Administered 2023-03-31: 7 [IU] via SUBCUTANEOUS
  Administered 2023-03-31: 3 [IU] via SUBCUTANEOUS
  Administered 2023-03-31: 7 [IU] via SUBCUTANEOUS

## 2023-03-30 NOTE — Progress Notes (Signed)
PHARMACY - PHYSICIAN COMMUNICATION CRITICAL VALUE ALERT - BLOOD CULTURE IDENTIFICATION (BCID)  Helen Richardson is an 89 y.o. female who presented to [facility] on 03/28/2023 with intraabdominal fluid collection/flank pain likely related to malignancy.  Assessment:  BCID + Enterococcus faecalis (no resistance detected) in 1 out of 4 bottles.    Name of physician (or Provider) Contacted: Chinita Greenland, FNP  Current antibiotics: none  Changes to prescribed antibiotics recommended:  Recommendations accepted by provider:  Begin Ampicillin 2g IV q6h (CrCl ~ 45 ml/min)  Results for orders placed or performed during the hospital encounter of 03/28/23  Blood Culture ID Panel (Reflexed) (Collected: 03/28/2023 11:47 PM)  Result Value Ref Range   Enterococcus faecalis DETECTED (A) NOT DETECTED   Enterococcus Faecium NOT DETECTED NOT DETECTED   Listeria monocytogenes NOT DETECTED NOT DETECTED   Staphylococcus species NOT DETECTED NOT DETECTED   Staphylococcus aureus (BCID) NOT DETECTED NOT DETECTED   Staphylococcus epidermidis NOT DETECTED NOT DETECTED   Staphylococcus lugdunensis NOT DETECTED NOT DETECTED   Streptococcus species NOT DETECTED NOT DETECTED   Streptococcus agalactiae NOT DETECTED NOT DETECTED   Streptococcus pneumoniae NOT DETECTED NOT DETECTED   Streptococcus pyogenes NOT DETECTED NOT DETECTED   A.calcoaceticus-baumannii NOT DETECTED NOT DETECTED   Bacteroides fragilis NOT DETECTED NOT DETECTED   Enterobacterales NOT DETECTED NOT DETECTED   Enterobacter cloacae complex NOT DETECTED NOT DETECTED   Escherichia coli NOT DETECTED NOT DETECTED   Klebsiella aerogenes NOT DETECTED NOT DETECTED   Klebsiella oxytoca NOT DETECTED NOT DETECTED   Klebsiella pneumoniae NOT DETECTED NOT DETECTED   Proteus species NOT DETECTED NOT DETECTED   Salmonella species NOT DETECTED NOT DETECTED   Serratia marcescens NOT DETECTED NOT DETECTED   Haemophilus influenzae NOT DETECTED NOT DETECTED    Neisseria meningitidis NOT DETECTED NOT DETECTED   Pseudomonas aeruginosa NOT DETECTED NOT DETECTED   Stenotrophomonas maltophilia NOT DETECTED NOT DETECTED   Candida albicans NOT DETECTED NOT DETECTED   Candida auris NOT DETECTED NOT DETECTED   Candida glabrata NOT DETECTED NOT DETECTED   Candida krusei NOT DETECTED NOT DETECTED   Candida parapsilosis NOT DETECTED NOT DETECTED   Candida tropicalis NOT DETECTED NOT DETECTED   Cryptococcus neoformans/gattii NOT DETECTED NOT DETECTED   Vancomycin resistance NOT DETECTED NOT DETECTED    Maryellen Pile, PharmD 03/30/2023  10:05 PM

## 2023-03-30 NOTE — Progress Notes (Signed)
Pt's blood sugar is 63; asymptomatic. Sent message to Johann Capers, NP of this and current IV fluid clarification needed (LR vs D5LR). Orders received. He wants to have her blood sugar checked every 4 hours. Changed IV fluids to D5NS @20ml . Received verbal order to put in order for hypoglycemia for adults with Diabetes standing order.

## 2023-03-30 NOTE — Progress Notes (Signed)
Patient ID: Helen Richardson, female   DOB: [birth year], 89 y.o.   MRN: [id number] Pt scheduled for abd drain placement on 1/28; consent for procedure obtained from pt's son Darryl Gee . Pt ate this afternoon therefore procedure rescheduled for 1/28.

## 2023-03-30 NOTE — Progress Notes (Signed)
Patient's consent HAS BEEN SIGNED and IR has it. NPO at midnight and drain will be placed 03/30/23. Hold Lovenox.

## 2023-03-30 NOTE — Progress Notes (Signed)
PROGRESS NOTE  Helen Richardson XBJ:[id number] DOB: Aug 13, 1933 DOA: 03/28/2023 PCP: System, Provider Not In  Brief History   The patient is a 89 yr old woman who presented to [facility] ED on 03/28/2023 with complaints of right sided flank pain. The patient had had a previous admission in November 2024 for AMS after being found unresponsive. She was found to be hypoglycemic on that occasion. She gradually improved, and was discharged to outpatient follow-up. The patient states that she developed right sided flank pain and high glucoses in the past few days. Upon presentation her glucose was 629. [name] Kitchen She also had a WBC of 18, Hgb 8.8. and sodium 125. CT abdomen and pelvis which demonstrated a circumferential mass in the distal ascending colon near the hepatic flexure measuring 7 cm. There is also a enhancing complex collection in the right perirenal space measuring 6 cm with concern for tumor or metastatic disease. Interventional radiology was consulted in the emergency department for possible drain placement versus biopsy. Dr. Deanne Coffer was contacted. She was placed on insulin and started on antibiotics.   The patient has been evaluated by IR. As the patient herself is not capable of expressing herself, following commands, or making her own decisions. Her son was at bedside. The procedure's benefits and risks were  discussed with the family. Ultimately no drain was placed as the family did not give consent. Palliative care has been consulted.   On 03/30/2023 the patient's WBC has declined slightly to 19.1. She is eating well upon my visit. No new complaints. IR was able to discuss procedure, risks and benefits with family as the patient was not able to to consent as she is not oriented. The family has consented to the procedure. She will be NPO after midnight to undergo drain placement and/or biopsy with IR on 03/31/2023.  A & P  Intraabdominal fluid collection with colon mass: IR has been able to obtain consent from  family for drain placement/biopsy on 03/31/2023. The patient has been made NPO after midnight.   Flank pain likely related to malignancy Patient presented with abdominal pain found to have a large colon mass with adjacent fluid collection Likely related to metastatic disease or locally advanced colon cancer No prior colonoscopy Patient able to tolerate p.o. and endorses normal stools Palliative care has been consulted. Oncology consult once tissue diagnosis is made. WBC has declined from 19.1 to 15.8. The patient is receiving oxycodone for pain. She has used it once since it was started on 03/29/2023.   Hyperglycemia in setting of poorly controlled type 2 diabetes Patient showed ketosis and hyperglycemia upon admission. However, no acidosis so concern for DKA is low. Blood sugars greater than 600 on presentation, but 63 later the day of admission. Pt on Tresiba 6 units daily prior to presentation. On admission the patient was placed on 15 units of Semglee daily with SSI. Blood sugars 115 - 153 in the last 24 hours. HbA1c is 11.7.  Dementia Moderately advanced. The patient is not situationally aware. She does not comprehend her medical situation and is unable to provide consent for procedures. IR has obtained consent from the patient's family for procedure tomorrow.  Bacteremia 1/2 bottles positive for e fecaelis. The patient is receiving IV ampicillin. Susceptibilities are pending. Will repeat cultures. WBC has declined from 19.1 to 15.8.  HTN The patient is normotensive without antihypertensives. Monitor.  GERD Continue Protonix  I have seen and examined this patient myself. I have spent 34 minutes in her evaluation  and care.  DVT prophylaxis: Lovenox Code Status: Full Code Family Communication: None available Disposition Plan: tbd    [name], DO [facility] Direct contact: see [email or web address]  7PM-7AM contact night coverage as above 03/31/2023, 8:45 AM  LOS: 2 days    Consultants  IR General Surgery Palliative care  Procedures  None  Antibiotics   Anti-infectives (From admission, onward)    Start     Dose/Rate Route Frequency Ordered Stop   03/30/23 2300  ampicillin (OMNIPEN) 2 g in sodium chloride 0.9 % 100 mL IVPB        2 g 300 mL/hr over 20 Minutes Intravenous Every 6 hours 03/30/23 2204     03/29/23 1000  ceFEPIme (MAXIPIME) 2 g in sodium chloride 0.9 % 100 mL IVPB  Status:  Discontinued        2 g 200 mL/hr over 30 Minutes Intravenous Every 12 hours 03/28/23 2348 03/29/23 0040   03/29/23 0000  vancomycin (VANCOCIN) IVPB 1000 mg/200 mL premix  Status:  Discontinued        1,000 mg 200 mL/hr over 60 Minutes Intravenous  Once 03/28/23 2348 03/29/23 0040   03/28/23 2215  cefTRIAXone (ROCEPHIN) 2 g in sodium chloride 0.9 % 100 mL IVPB        2 g 200 mL/hr over 30 Minutes Intravenous  Once 03/28/23 2203 03/29/23 0002        Interval History/Subjective  The patient is sitting up in bed eating lunch without difficulty.  Objective   Vitals:  Vitals:   03/31/23 0524 03/31/23 0708  BP: (!) 156/77 (!) 124/57  Pulse: 88 91  Resp: 18 16  Temp: 98.2 F (36.8 C) 98.2 F (36.8 C)  SpO2: 100% 100%    Exam:  Constitutional:  The patient is awake, alert, and oriented x 1. No acute distress. Respiratory:  No increased work of breathing. No wheezes, rales, or rhonchi No tactile fremitus Cardiovascular:  Regular rate and rhythm No murmurs, ectopy, or gallups. No lateral PMI. No thrills. Abdomen:  Abdomen is soft, non-distended. Positive for mild diffuse tenderness No hernias, masses, or organomegaly Normoactive bowel sounds.  Musculoskeletal:  No cyanosis, clubbing, or edema Skin:  No rashes, lesions, ulcers palpation of skin: no induration or nodules Neurologic:  CN 2-12 intact Sensation all 4 extremities intact Psychiatric:  Mental status Mood, affect appropriate Pleasantly confused   Scheduled Meds:  [START ON  04/01/2023] enoxaparin (LOVENOX) injection  40 mg Subcutaneous Q24H   insulin aspart  0-9 Units Subcutaneous TID WC   insulin glargine-yfgn  15 Units Subcutaneous Daily   pantoprazole  40 mg Oral BID   Continuous Infusions:  ampicillin (OMNIPEN) IV 2 g (03/31/23 0551)   dextrose 5 % and 0.9 % NaCl 30 mL/hr at 03/30/23 2357    Principal Problem:   Intraabdominal fluid collection   LOS: 2 days

## 2023-03-31 ENCOUNTER — Inpatient Hospital Stay (HOSPITAL_COMMUNITY): Payer: Medicare PPO

## 2023-03-31 DIAGNOSIS — Z515 Encounter for palliative care: Secondary | ICD-10-CM

## 2023-03-31 DIAGNOSIS — R188 Other ascites: Secondary | ICD-10-CM | POA: Diagnosis not present

## 2023-03-31 DIAGNOSIS — K651 Peritoneal abscess: Secondary | ICD-10-CM

## 2023-03-31 DIAGNOSIS — B952 Enterococcus as the cause of diseases classified elsewhere: Secondary | ICD-10-CM

## 2023-03-31 DIAGNOSIS — R4589 Other symptoms and signs involving emotional state: Secondary | ICD-10-CM

## 2023-03-31 DIAGNOSIS — Z7189 Other specified counseling: Secondary | ICD-10-CM

## 2023-03-31 DIAGNOSIS — K6389 Other specified diseases of intestine: Principal | ICD-10-CM

## 2023-03-31 DIAGNOSIS — R739 Hyperglycemia, unspecified: Secondary | ICD-10-CM | POA: Diagnosis not present

## 2023-03-31 DIAGNOSIS — F03C0 Unspecified dementia, severe, without behavioral disturbance, psychotic disturbance, mood disturbance, and anxiety: Secondary | ICD-10-CM

## 2023-03-31 DIAGNOSIS — C189 Malignant neoplasm of colon, unspecified: Secondary | ICD-10-CM

## 2023-03-31 LAB — CBC WITH DIFFERENTIAL/PLATELET
Abs Immature Granulocytes: 0.15 10*3/uL — ABNORMAL HIGH (ref 0.00–0.07)
Basophils Absolute: 0.1 10*3/uL (ref 0.0–0.1)
Basophils Relative: 0 %
Eosinophils Absolute: 0.2 10*3/uL (ref 0.0–0.5)
Eosinophils Relative: 1 %
HCT: 26.4 % — ABNORMAL LOW (ref 36.0–46.0)
Hemoglobin: 8.2 g/dL — ABNORMAL LOW (ref 12.0–15.0)
Immature Granulocytes: 1 %
Lymphocytes Relative: 9 %
Lymphs Abs: 1.5 10*3/uL (ref 0.7–4.0)
MCH: 24.2 pg — ABNORMAL LOW (ref 26.0–34.0)
MCHC: 31.1 g/dL (ref 30.0–36.0)
MCV: 77.9 fL — ABNORMAL LOW (ref 80.0–100.0)
Monocytes Absolute: 1.6 10*3/uL — ABNORMAL HIGH (ref 0.1–1.0)
Monocytes Relative: 10 %
Neutro Abs: 12.4 10*3/uL — ABNORMAL HIGH (ref 1.7–7.7)
Neutrophils Relative %: 79 %
Platelets: 371 10*3/uL (ref 150–400)
RBC: 3.39 MIL/uL — ABNORMAL LOW (ref 3.87–5.11)
RDW: 18.6 % — ABNORMAL HIGH (ref 11.5–15.5)
WBC: 15.8 10*3/uL — ABNORMAL HIGH (ref 4.0–10.5)
nRBC: 0 % (ref 0.0–0.2)

## 2023-03-31 LAB — BASIC METABOLIC PANEL
Anion gap: 9 (ref 5–15)
BUN: 15 mg/dL (ref 8–23)
CO2: 25 mmol/L (ref 22–32)
Calcium: 8.2 mg/dL — ABNORMAL LOW (ref 8.9–10.3)
Chloride: 100 mmol/L (ref 98–111)
Creatinine, Ser: 0.75 mg/dL (ref 0.44–1.00)
GFR, Estimated: >60 mL/min (ref >60)
Glucose, Bld: 154 mg/dL — ABNORMAL HIGH (ref 70–99)
Potassium: 4 mmol/L (ref 3.5–5.1)
Sodium: 134 mmol/L — ABNORMAL LOW (ref 135–145)

## 2023-03-31 LAB — GLUCOSE, CAPILLARY
Glucose-Capillary: 130 mg/dL — ABNORMAL HIGH (ref 70–99)
Glucose-Capillary: 158 mg/dL — ABNORMAL HIGH (ref 70–99)
Glucose-Capillary: 217 mg/dL — ABNORMAL HIGH (ref 70–99)
Glucose-Capillary: 327 mg/dL — ABNORMAL HIGH (ref 70–99)
Glucose-Capillary: 36 mg/dL — CL (ref 70–99)
Glucose-Capillary: 375 mg/dL — ABNORMAL HIGH (ref 70–99)
Glucose-Capillary: 78 mg/dL (ref 70–99)

## 2023-03-31 LAB — VITAMIN B1: Vitamin B1 (Thiamine): 120.1 nmol/L (ref 66.5–200.0)

## 2023-03-31 MED ORDER — MIDAZOLAM HCL 2 MG/2ML IJ SOLN
INTRAMUSCULAR | Status: DC | PRN
  Administered 2023-03-31: .5 mg via INTRAVENOUS

## 2023-03-31 MED ORDER — FENTANYL CITRATE (PF) 100 MCG/2ML IJ SOLN
INTRAMUSCULAR | Status: AC
  Filled 2023-03-31: qty 2

## 2023-03-31 MED ORDER — FLUMAZENIL 0.5 MG/5ML IV SOLN
INTRAVENOUS | Status: AC
  Filled 2023-03-31: qty 5

## 2023-03-31 MED ORDER — LIDOCAINE HCL 1 % IJ SOLN
INTRAMUSCULAR | Status: DC | PRN
  Administered 2023-03-31: 10 mL via INTRADERMAL

## 2023-03-31 MED ORDER — NALOXONE HCL 0.4 MG/ML IJ SOLN
INTRAMUSCULAR | Status: AC
  Filled 2023-03-31: qty 1

## 2023-03-31 MED ORDER — FENTANYL CITRATE (PF) 100 MCG/2ML IJ SOLN
INTRAMUSCULAR | Status: DC | PRN
  Administered 2023-03-31: 25 ug via INTRAVENOUS

## 2023-03-31 MED ORDER — FENTANYL CITRATE (PF) 100 MCG/2ML IJ SOLN
INTRAMUSCULAR | Status: DC | PRN
Start: 2023-03-31 — End: 2023-04-14
  Administered 2023-03-31: 25 ug via INTRAVENOUS

## 2023-03-31 MED ORDER — MIDAZOLAM HCL 2 MG/2ML IJ SOLN
INTRAMUSCULAR | Status: AC
  Filled 2023-03-31: qty 2

## 2023-03-31 MED ORDER — ENOXAPARIN SODIUM 40 MG/0.4ML IJ SOSY
40.0000 mg | PREFILLED_SYRINGE | INTRAMUSCULAR | Status: DC
  Administered 2023-04-01 – 2023-04-11 (×11): 40 mg via SUBCUTANEOUS
  Filled 2023-03-31 (×11): qty 0.4

## 2023-03-31 MED ORDER — DEXTROSE 50 % IV SOLN
INTRAVENOUS | Status: AC
  Administered 2023-03-31: 25 g
  Filled 2023-03-31: qty 50

## 2023-03-31 MED ORDER — SODIUM CHLORIDE 0.9 % IV SOLN
INTRAVENOUS | Status: AC
  Filled 2023-03-31: qty 250

## 2023-03-31 MED ORDER — GLUCAGON HCL RDNA (DIAGNOSTIC) 1 MG IJ SOLR: 1.0000 mg | INTRAMUSCULAR | Status: AC

## 2023-03-31 MED ORDER — SODIUM CHLORIDE 0.9 % IV SOLN
3.0000 g | Freq: Four times a day (QID) | INTRAVENOUS | Status: DC
  Administered 2023-03-31 – 2023-04-11 (×45): 3 g via INTRAVENOUS
  Filled 2023-03-31 (×46): qty 8

## 2023-03-31 MED ORDER — SODIUM CHLORIDE 0.9% FLUSH
5.0000 mL | Freq: Three times a day (TID) | INTRAVENOUS | Status: DC
  Administered 2023-03-31 – 2023-04-14 (×38): 5 mL

## 2023-03-31 MED ORDER — POTASSIUM CHLORIDE 2 MEQ/ML IV SOLN
INTRAVENOUS | Status: DC
  Filled 2023-03-31 (×7): qty 1000

## 2023-03-31 NOTE — Procedures (Addendum)
Interventional Radiology Procedure Note  Procedure: Image guided drain placement, right abdominal abscess.  93F pigtail drain.  ~ 25cc purulent material aspirated  Complications: None  EBL: None Sample: Culture sent  Recommendations: - Routine drain care, with sterile flushes, record output - follow up Cx - routine wound care - ok to start diet per primary order - ok to restart any AC needed  Signed,  Yvone Neu. Loreta Ave, DO

## 2023-03-31 NOTE — Consult Note (Signed)
Date of Admission:  03/28/2023          Reason for Consult: E faecalis bacteremia    Referring Provider: Auto Consult for CHAMP, Ava Swayze, DO   Assessment:  Advanced dementia Apparently newly diagnosed colonic malignancy with  Intrabdominal abscess sp IR guided drainage E faecalis bacteremia    Plan:  Change to unasyn TTE Repeat blood cultures Would consult Palliative Care given likelihood that she has high probability of undiagnosed colon cancer and this is a situation where goals of care will be more important than exercises in aggressive care which may ultimately cause unwanted suffering to the patient   Principal Problem:   Intraabdominal fluid collection   Scheduled Meds:  [START ON 04/01/2023] enoxaparin (LOVENOX) injection  40 mg Subcutaneous Q24H   glucagon (human recombinant)  1 mg Intramuscular STAT   insulin aspart  0-9 Units Subcutaneous TID WC   insulin glargine-yfgn  15 Units Subcutaneous Daily   pantoprazole  40 mg Oral BID   sodium chloride flush  5 mL Intracatheter Q8H   Continuous Infusions:  ampicillin-sulbactam (UNASYN) IV 3 g (03/31/23 1530)   dextrose 10 % and 0.45 % NaCl 1,000 mL with potassium chloride 40 mEq infusion 30 mL/hr at 03/31/23 1437   PRN Meds:.acetaminophen **OR** acetaminophen, fentaNYL, fentaNYL, lidocaine, midazolam, ondansetron **OR** ondansetron (ZOFRAN) IV, oxyCODONE  HPI: Helen Richardson is a 89 y.o. female moderately advanced  dementia, GERD and prior With admission in November with with delirium and hypoglycemia.  She now presented with right-sided flank pain and high blood sugars with worsening confusion.  In the ER her blood sugar was at 629 and her white blood cell count was 18,000 hemoglobin 8.8 and sodium of 125.  CT of the abdomen pelvis was performed due to her flank and abdominal pain.  CT abdomen pelvis unfortunately shows a circumferential heterogeneous mass distal ascending colon near the hepatic  flexure that is up to 7 1.1 cm in greatest dimensions along with a peripheral enhancing complex fluid collection around the right Marina Goodell renal space lateral to the kidney which abuts the colonic mass along with some diverticula Kalosis in the sigmoid colon.  The patient is now status post IR guided drainage of purulent material from the abdomen.  Her blood cultures have also turned +1 of 2 sites for E faecalis  She was started on AMP once blood cultures came back positive.  Switch over to Unasyn for better anaerobic coverage in the abdomen along with coverage for Enterococcus faecalis.  I will order 2D echocardiogram but again given the circumstances of her significant dementia I would not pursue aggressive measures such as transesophageal echocardiogram.  To this point given the likelihood of malignancy now found on CT scan I would strongly encourage a palliative care consult.  I do NOT think this patient will be an appropriate candidate for long term IV antibiotics  I have personally spent 85 minutes involved in face-to-face and non-face-to-face activities for this patient on the day of the visit. Professional time spent includes the following activities: Preparing to see the patient (review of tests), Obtaining and/or reviewing separately obtained history (admission/discharge record), Performing a medically appropriate examination and/or evaluation , Ordering medications/tests/procedures, referring and communicating with other health care professionals, Documenting clinical information in the EMR, Independently interpreting results (not separately reported), Communicating results to the patient/family/caregiver, Counseling and educating the patient/family/caregiver and Care coordination (not separately reported).   Evaluation of the patient requires complex antimicrobial  therapy evaluation, counseling , isolation needs to reduce disease transmission and risk assessment and mitigation.      Review of Systems: Review of Systems  Unable to perform ROS: Dementia    Past Medical History:  Diagnosis Date   CHF (congestive heart failure) (HCC)    Diabetes mellitus without complication (HCC)    High cholesterol    HTN (hypertension)    Obesity     Social History   Tobacco Use   Smoking status: Never   Smokeless tobacco: Never  Vaping Use   Vaping status: Never Used  Substance Use Topics   Alcohol use: No   Drug use: No    Family History  Problem Relation Age of Onset   Diabetes Mother    Hypertension Mother    Hypertension Father    Stroke Father    Allergies  Allergen Reactions   Tradjenta [Linagliptin] Swelling    OBJECTIVE: Blood pressure (!) 165/91, pulse 90, temperature (!) 97.4 F (36.3 C), temperature source Oral, resp. rate 17, weight 76.1 kg, SpO2 92%.  Physical Exam Constitutional:      Appearance: She is ill-appearing.  Eyes:     General:        Right eye: No discharge.        Left eye: No discharge.     Extraocular Movements: Extraocular movements intact.  Pulmonary:     Effort: Pulmonary effort is normal.     Breath sounds: No wheezing.  Abdominal:     Tenderness: There is abdominal tenderness. There is right CVA tenderness.  Musculoskeletal:        General: Normal range of motion.  Skin:    General: Skin is warm and dry.  Neurological:     Mental Status: She is alert. She is disoriented.  Psychiatric:        Cognition and Memory: Cognition is impaired. Memory is impaired. She exhibits impaired recent memory and impaired remote memory.     Lab Results Lab Results  Component Value Date   WBC 15.8 (H) 03/31/2023   HGB 8.2 (L) 03/31/2023   HCT 26.4 (L) 03/31/2023   MCV 77.9 (L) 03/31/2023   PLT 371 03/31/2023    Lab Results  Component Value Date   CREATININE 0.75 03/31/2023   BUN 15 03/31/2023   NA 134 (L) 03/31/2023   K 4.0 03/31/2023   CL 100 03/31/2023   CO2 25 03/31/2023    Lab Results  Component Value  Date   ALT 11 03/29/2023   AST 15 03/29/2023   ALKPHOS 63 03/29/2023   BILITOT 0.7 03/29/2023     Microbiology: Recent Results (from the past 240 hours)  Blood culture (routine x 2)     Status: None (Preliminary result)   Collection Time: 03/28/23 11:42 PM   Specimen: BLOOD  Result Value Ref Range Status   Specimen Description   Final    BLOOD RIGHT ANTECUBITAL Performed at [facility], [address], [place], [place] [address]    Special Requests   Final    Blood Culture adequate volume BOTTLES DRAWN AEROBIC AND ANAEROBIC Performed at [facility], [address], [place], [place] [address]    Culture   Final    NO GROWTH 2 DAYS Performed at [facility], [address], [place], [place] [address]    Report Status PENDING  Incomplete  Blood culture (routine x 2)     Status: Abnormal (Preliminary result)   Collection Time:  03/28/23 11:47 PM   Specimen: BLOOD  Result Value Ref Range Status   Specimen Description   Final    BLOOD LEFT ANTECUBITAL Performed at [facility], [address], [place], [place] [address]    Special Requests   Final    Blood Culture results may not be optimal due to an inadequate volume of blood received in culture bottles BOTTLES DRAWN AEROBIC AND ANAEROBIC Performed at [facility], [address] [place], [place] [address]    Culture  Setup Time   Final    GRAM POSITIVE COCCI IN CHAINS ANAEROBIC BOTTLE ONLY CRITICAL RESULT CALLED TO, READ BACK BY AND VERIFIED WITH: PHARMD L POINTDEXTER 03/30/2023 @ 2154 BY AB    Culture (A)  Final    ENTEROCOCCUS FAECALIS CULTURE REINCUBATED FOR BETTER GROWTH Performed at [facility], [address], [place], [place] [address]    Report Status PENDING  Incomplete  Blood Culture ID Panel (Reflexed)     Status: Abnormal   Collection Time: 03/28/23 11:47 PM  Result Value Ref Range Status   Enterococcus faecalis  DETECTED (A) NOT DETECTED Final    Comment: CRITICAL RESULT CALLED TO, READ BACK BY AND VERIFIED WITH: PHARMD L POINTDEXTER 03/30/2023 @ 2154 BY AB    Enterococcus Faecium NOT DETECTED NOT DETECTED Final   Listeria monocytogenes NOT DETECTED NOT DETECTED Final   Staphylococcus species NOT DETECTED NOT DETECTED Final   Staphylococcus aureus (BCID) NOT DETECTED NOT DETECTED Final   Staphylococcus epidermidis NOT DETECTED NOT DETECTED Final   Staphylococcus lugdunensis NOT DETECTED NOT DETECTED Final   Streptococcus species NOT DETECTED NOT DETECTED Final   Streptococcus agalactiae NOT DETECTED NOT DETECTED Final   Streptococcus pneumoniae NOT DETECTED NOT DETECTED Final   Streptococcus pyogenes NOT DETECTED NOT DETECTED Final   A.calcoaceticus-baumannii NOT DETECTED NOT DETECTED Final   Bacteroides fragilis NOT DETECTED NOT DETECTED Final   Enterobacterales NOT DETECTED NOT DETECTED Final   Enterobacter cloacae complex NOT DETECTED NOT DETECTED Final   Escherichia coli NOT DETECTED NOT DETECTED Final   Klebsiella aerogenes NOT DETECTED NOT DETECTED Final   Klebsiella oxytoca NOT DETECTED NOT DETECTED Final   Klebsiella pneumoniae NOT DETECTED NOT DETECTED Final   Proteus species NOT DETECTED NOT DETECTED Final   Salmonella species NOT DETECTED NOT DETECTED Final   Serratia marcescens NOT DETECTED NOT DETECTED Final   Haemophilus influenzae NOT DETECTED NOT DETECTED Final   Neisseria meningitidis NOT DETECTED NOT DETECTED Final   Pseudomonas aeruginosa NOT DETECTED NOT DETECTED Final   Stenotrophomonas maltophilia NOT DETECTED NOT DETECTED Final   Candida albicans NOT DETECTED NOT DETECTED Final   Candida auris NOT DETECTED NOT DETECTED Final   Candida glabrata NOT DETECTED NOT DETECTED Final   Candida krusei NOT DETECTED NOT DETECTED Final   Candida parapsilosis NOT DETECTED NOT DETECTED Final   Candida tropicalis NOT DETECTED NOT DETECTED Final   Cryptococcus neoformans/gattii NOT  DETECTED NOT DETECTED Final   Vancomycin resistance NOT DETECTED NOT DETECTED Final    Comment: Performed at [facility], [address] [place], [place] [address]    Acey Lav, MD [facility] [phone] pager  03/31/2023, 4:20 PM

## 2023-03-31 NOTE — Progress Notes (Signed)
[name] Kitchen

## 2023-03-31 NOTE — Progress Notes (Signed)
   03/31/23 2108  Vitals  Temp 99.1 F (37.3 C)  BP (!) 112/57  MAP (mmHg) 75  BP Location Left Arm  BP Method Automatic  Patient Position (if appropriate) Lying  Pulse Rate (!) 126  Pulse Rate Source Monitor  Resp 20  Level of Consciousness  Level of Consciousness Alert  MEWS COLOR  MEWS Score Color Yellow  Oxygen Therapy  SpO2 93 %  O2 Device Room Air  MEWS Score  MEWS Temp 0  MEWS Systolic 0  MEWS Pulse 2  MEWS RR 0  MEWS LOC 0  MEWS Score 2  Provider Notification  Provider Name/Title J.Daniels,NP  Date Provider Notified 03/31/23  Time Provider Notified 2147  Method of Notification  (secure message)  Notification Reason Change in status  Provider response See new orders  Date of Provider Response 03/31/23  Time of Provider Response 2150

## 2023-03-31 NOTE — Progress Notes (Signed)
PROGRESS NOTE  Helen Richardson ZOX:[id number] DOB: January 13, 1934 DOA: 03/28/2023 PCP: System, Provider Not In  Brief History   The patient is a 89 yr old woman who presented to [facility] ED on 03/28/2023 with complaints of right sided flank pain. The patient had had a previous admission in November 2024 for AMS after being found unresponsive. She was found to be hypoglycemic on that occasion. She gradually improved, and was discharged to outpatient follow-up. The patient states that she developed right sided flank pain and high glucoses in the past few days. Upon presentation her glucose was 629. [name] Kitchen She also had a WBC of 18, Hgb 8.8. and sodium 125. CT abdomen and pelvis which demonstrated a circumferential mass in the distal ascending colon near the hepatic flexure measuring 7 cm. There is also a enhancing complex collection in the right perirenal space measuring 6 cm with concern for tumor or metastatic disease. Interventional radiology was consulted in the emergency department for possible drain placement versus biopsy. Dr. Deanne Coffer was contacted. She was placed on insulin and started on antibiotics.   The patient has been evaluated by IR. As the patient herself is not capable of expressing herself, following commands, or making her own decisions. Her son was at bedside. The procedure's benefits and risks were  discussed with the family. Ultimately no drain was placed as the family did not give consent. Palliative care has been consulted.   On 03/31/2023 the patient's WBC has declined slightly to 15.7. The patient had been NPO after midnight. She was started on D5 NS at 75 cc/hr at 2100 on 03/30/2023. The next morning the patient was again hypoglycemic while awaiting surgery. The fluids were changed to D10 1/2 NS . She was taken for the procedure by IR the following afternoon. It seems that she has tolerated the procedure well. Fluids aspirated were sent for culture and ID has been consulted. Palliative care has  initiated discussions with the patient's family about their wishes regarding the mass.   A & P  Intraabdominal fluid collection with colon mass: The patient has had a drain placed by IR today. She has tolerated the procedure well. Aspirated fluid has been sent for gram stain and culture. ID has been consulted. Palliative care has discussed the patient with the family and initiated discussions about what their wishes will be regarding the patient's colon mass. Her diet has now been restarted. WBC has declined from 19.1 to 15.8. She is receiving IV ampicillin. Blood cultures x 2 have been obtained.    Flank pain likely related to malignancy Patient presented with abdominal pain found to have a large colon mass with adjacent fluid collection Likely related to metastatic disease or locally advanced colon cancer. The patient has had no prior colonoscopy. Palliative care has been consulted. Oncology consult will be obtained once tissue diagnosis is made. WBC has declined from 19.1 to 15.8. The patient is receiving oxycodone for pain. She has used it once since it was started on 03/31/2023.   Hyperglycemia in setting of poorly controlled type 2 diabetes Patient showed ketosis and hyperglycemia upon admission. However, no acidosis so concern for DKA is low. Blood sugars greater than 600 on presentation, but 63 later the day of admission. Pt on Tresiba 6 units daily prior to presentation. On admission the patient was placed on 15 units of Semglee daily with SSI. Blood sugars 115 - 153 in the last 24 hours. HbA1c is 11.7.  Dementia Moderately advanced. The patient is not situationally  aware. She does not comprehend her medical situation and is unable to provide consent for procedures. IR has obtained consent from the patient's family for procedure tomorrow.  Bacteremia 1/2 bottles positive for e fecaelis. The patient is receiving IV ampicillin. Susceptibilities are pending. Will repeat cultures. WBC  has declined from 19.1 to 15.8.  HTN The patient is normotensive without antihypertensives. Monitor.  GERD Continue Protonix  I have seen and examined this patient myself. I have spent 34 minutes in her evaluation and care.  DVT prophylaxis: Lovenox Code Status: Full Code Family Communication: None available Disposition Plan: tbd    Tiajuana Leppanen, DO [facility] Direct contact: see [email or web address]  7PM-7AM contact night coverage as above 03/31/2023, 5:25 PM  LOS: 2 days   Consultants  IR General Surgery Palliative care  Procedures  None  Antibiotics   Anti-infectives (From admission, onward)    Start     Dose/Rate Route Frequency Ordered Stop   03/31/23 1600  Ampicillin-Sulbactam (UNASYN) 3 g in sodium chloride 0.9 % 100 mL IVPB        3 g 200 mL/hr over 30 Minutes Intravenous Every 6 hours 03/31/23 1451     03/30/23 2300  ampicillin (OMNIPEN) 2 g in sodium chloride 0.9 % 100 mL IVPB  Status:  Discontinued        2 g 300 mL/hr over 20 Minutes Intravenous Every 6 hours 03/30/23 2204 03/31/23 1451   03/29/23 1000  ceFEPIme (MAXIPIME) 2 g in sodium chloride 0.9 % 100 mL IVPB  Status:  Discontinued        2 g 200 mL/hr over 30 Minutes Intravenous Every 12 hours 03/28/23 2348 03/29/23 0040   03/29/23 0000  vancomycin (VANCOCIN) IVPB 1000 mg/200 mL premix  Status:  Discontinued        1,000 mg 200 mL/hr over 60 Minutes Intravenous  Once 03/28/23 2348 03/29/23 0040   03/28/23 2215  cefTRIAXone (ROCEPHIN) 2 g in sodium chloride 0.9 % 100 mL IVPB        2 g 200 mL/hr over 30 Minutes Intravenous  Once 03/28/23 2203 03/29/23 0002        Interval History/Subjective  The patient is sitting up in bed eating lunch without difficulty.  Objective   Vitals:  Vitals:   03/31/23 1538 03/31/23 1637  BP: (!) 165/91 (!) 188/103  Pulse: 90 68  Resp: 17 17  Temp: (!) 97.4 F (36.3 C) (!) 97.3 F (36.3 C)  SpO2: 92% (!) 80%    Exam:  Constitutional:  The patient is  somnolent. No acute distress. Respiratory:  No increased work of breathing. No wheezes, rales, or rhonchi No tactile fremitus Cardiovascular:  Regular rate and rhythm No murmurs, ectopy, or gallups. No lateral PMI. No thrills. Abdomen:  Abdomen is soft, non-distended. Positive for mild diffuse tenderness No hernias, masses, or organomegaly Normoactive bowel sounds.  Musculoskeletal:  No cyanosis, clubbing, or edema Skin:  No rashes, lesions, ulcers palpation of skin: no induration or nodules Neurologic:  CN 2-12 intact Sensation all 4 extremities intact Psychiatric:  Mental status Mood, affect appropriate Pleasantly confused   Scheduled Meds:  [START ON 04/01/2023] enoxaparin (LOVENOX) injection  40 mg Subcutaneous Q24H   glucagon (human recombinant)  1 mg Intramuscular STAT   insulin aspart  0-9 Units Subcutaneous TID WC   insulin glargine-yfgn  15 Units Subcutaneous Daily   pantoprazole  40 mg Oral BID   sodium chloride flush  5 mL Intracatheter Q8H   Continuous Infusions:  ampicillin-sulbactam (UNASYN) IV 3 g (03/31/23 1530)   dextrose 10 % and 0.45 % NaCl 1,000 mL with potassium chloride 40 mEq infusion 30 mL/hr at 03/31/23 1437    Principal Problem:   Intraabdominal fluid collection Active Problems:   Palliative care encounter   Colonic mass   Goals of care, counseling/discussion   Counseling and coordination of care   Need for emotional support   LOS: 2 days

## 2023-03-31 NOTE — Plan of Care (Signed)
Problem: Education: Goal: Knowledge of General Education information will improve Description Including pain rating scale, medication(s)/side effects and non-pharmacologic comfort measures Outcome: Progressing   Problem: Health Behavior/Discharge Planning: Goal: Ability to manage health-related needs will improve Outcome: Progressing

## 2023-03-31 NOTE — Consult Note (Signed)
Consultation Note Date: 03/31/2023   Patient Name: Helen Richardson  DOB: [birth year]  MRN: [id number]  Age / Sex: 89 y.o., female   PCP: System, Provider Not In Referring Physician: Fran Lowes, DO  Reason for Consultation: Establishing goals of care     Chief Complaint/History of Present Illness:   Patient is an 89 year old female with a past medical history of dementia, hypertension, GERD, and diabetes who was admitted on 03/28/2023 for management of right-sided flank pain.  Since admission, patient had imaging which demonstrated a circumferential mass in the distal ascending colon near the hepatic flexure measuring 7 cm and also on enhancing complex collection in the right perirenal space measuring 6 cm with concern for tumor or metastatic disease.  IR consulted for evaluation.  Palliative medicine team consulted to assist with complex medical decision making.  Extensive review of EMR prior to presenting to bedside.  Upon initial presentation to bedside, patient had gone to IR to have drain placed and biopsy obtained.  IR note noted approximately 25 cc of purulent material aspirated from right abdominal abscess with 48F pigtail drain put in place.  Culture sent for evaluation.  Presented to bedside later on in the day.  Patient presents moaning in the bed.  No family present at bedside.  Unable to get detailed history from patient due to her signs of pain.  Patient just kept saying "what it they do to me" and "I was fine before this". Able to discuss care with RN to provide pain medications that were already present in EMR.   Upon EMR review later in day ID had been consulted as well.  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  Able to call patient's son, Helen Richardson, later in afternoon.  Introduced myself as a member of the palliative medicine team and my role in patient's medical care.  Spent time reviewing patient's medical journey up into this point.  Again reviewed CT imaging from  presentation that showed colon mass and abscess.  Discussed patient underwent IR procedure today for abscess drainage with drain now in place.  Discussed that purulent material was found in abscess.  Discussed that ID has been consulted due to bacteria found.  Discussed continuing appropriate medical interventions at this time including antibiotics for management. Spent time expressing that overall concern beyond this abscess is primarily the colon mass and that it could be colon cancer that has spread.   Tried to learn about patient's medical status prior to admission to the hospital.  Helen Richardson noted patient had lived at home with a caregiver.  Noted that recently caregiver had to take on care of another patient so plan moving forward will be for him and his niece to assist in patient's care.  They are hoping to get nursing assistance at home for this as well. Spent time discussing there are pathways for medical care moving forward such as pursuing biopsy though due to patient's age and functional status concerned that cancer directed therapies may cause more harm than benefit.  Son acknowledged this and noted he was unsure if patient would want to pursue a biopsy or just be allowed time with family at home.  Helen Richardson wants time to follow-up with family to discuss this and was wondering if there could be a time we could all meet once he has had time to talk to family and the patient.  First available time Helen Richardson noted family would have would be Thursday, 04/02/23, at 2 PM.  Noted we will  plan family meeting for that time.  Discussed should there be further updates to medical care, medical team should continue to update family regarding patient's medical status.  All questions answered at that time.  Noted palliative medicine team will continue to following with patient's medical journey.  Updated IDT after discussion with son.  Primary Diagnoses  Present on Admission:  Intraabdominal fluid  collection   Palliative Review of Systems: Pain after procedure  Past Medical History:  Diagnosis Date   CHF (congestive heart failure) (HCC)    Diabetes mellitus without complication (HCC)    High cholesterol    HTN (hypertension)    Obesity    Social History   Socioeconomic History   Marital status: Widowed    Spouse name: Not on file   Number of children: Not on file   Years of education: Not on file   Highest education level: Not on file  Occupational History   Occupation: retired  Tobacco Use   Smoking status: Never   Smokeless tobacco: Never  Vaping Use   Vaping status: Never Used  Substance and Sexual Activity   Alcohol use: No   Drug use: No   Sexual activity: Not Currently  Other Topics Concern   Not on file  Social History Narrative   Not on file   Social Drivers of Health   Financial Resource Strain: Low Risk  (10/18/2021)   Overall Financial Resource Strain (CARDIA)    Difficulty of Paying Living Expenses: Not hard at all  Food Insecurity: Patient Unable To Answer (03/30/2023)   Hunger Vital Sign    Worried About Running Out of Food in the Last Year: Patient unable to answer    Ran Out of Food in the Last Year: Patient unable to answer  Transportation Needs: No Transportation Needs (03/30/2023)   PRAPARE - Administrator, Civil Service (Medical): No    Lack of Transportation (Non-Medical): No  Physical Activity: Inactive (10/18/2021)   Exercise Vital Sign    Days of Exercise per Week: 0 days    Minutes of Exercise per Session: 0 min  Stress: No Stress Concern Present (10/18/2021)   [employer] of Occupational Health - Occupational Stress Questionnaire    Feeling of Stress : Not at all  Social Connections: Patient Unable To Answer (03/30/2023)   Social Connection and Isolation Panel [NHANES]    Frequency of Communication with Friends and Family: Patient unable to answer    Frequency of Social Gatherings with Friends and Family:  Patient unable to answer    Attends Religious Services: Patient unable to answer    Active Member of Clubs or Organizations: Patient unable to answer    Attends Banker Meetings: Patient unable to answer    Marital Status: Patient unable to answer   Family History  Problem Relation Age of Onset   Diabetes Mother    Hypertension Mother    Hypertension Father    Stroke Father    Scheduled Meds:  [START ON 04/01/2023] enoxaparin (LOVENOX) injection  40 mg Subcutaneous Q24H   insulin aspart  0-9 Units Subcutaneous TID WC   insulin glargine-yfgn  15 Units Subcutaneous Daily   pantoprazole  40 mg Oral BID   Continuous Infusions:  ampicillin (OMNIPEN) IV 2 g (03/31/23 0551)   dextrose 5 % and 0.9 % NaCl 30 mL/hr at 03/30/23 2357   PRN Meds:.acetaminophen **OR** acetaminophen, ondansetron **OR** ondansetron (ZOFRAN) IV, oxyCODONE Allergies  Allergen Reactions   Tradjenta [Linagliptin] Swelling  CBC:    Component Value Date/Time   WBC 15.8 (H) 03/31/2023 0518   HGB 8.2 (L) 03/31/2023 0518   HGB 8.3 (L) 12/18/2022 1336   HGB 12.6 03/14/2020 1616   HCT 26.4 (L) 03/31/2023 0518   HCT 38.9 03/14/2020 1616   PLT 371 03/31/2023 0518   PLT 474 (H) 12/18/2022 1336   PLT 269 03/14/2020 1616   MCV 77.9 (L) 03/31/2023 0518   MCV 88 03/14/2020 1616   NEUTROABS 12.4 (H) 03/31/2023 0518   LYMPHSABS 1.5 03/31/2023 0518   MONOABS 1.6 (H) 03/31/2023 0518   EOSABS 0.2 03/31/2023 0518   BASOSABS 0.1 03/31/2023 0518   Comprehensive Metabolic Panel:    Component Value Date/Time   NA 134 (L) 03/31/2023 0518   NA 135 02/20/2021 1550   K 4.0 03/31/2023 0518   CL 100 03/31/2023 0518   CO2 25 03/31/2023 0518   BUN 15 03/31/2023 0518   BUN 14 02/20/2021 1550   CREATININE 0.75 03/31/2023 0518   CREATININE 1.02 (H) 11/19/2022 1255   GLUCOSE 154 (H) 03/31/2023 0518   CALCIUM 8.2 (L) 03/31/2023 0518   AST 15 03/29/2023 0401   AST 11 (L) 11/19/2022 1255   ALT 11 03/29/2023 0401    ALT 6 11/19/2022 1255   ALKPHOS 63 03/29/2023 0401   BILITOT 0.7 03/29/2023 0401   BILITOT 0.4 11/19/2022 1255   PROT 6.6 03/29/2023 0401   PROT 6.1 11/15/2020 1542   ALBUMIN 2.4 (L) 03/29/2023 0401   ALBUMIN 3.6 11/15/2020 1542    Physical Exam: Vital Signs: BP (!) 124/57 (BP Location: Left Arm)   Pulse 91   Temp 98.2 F (36.8 C) (Oral)   Resp 16   Wt 76.1 kg   SpO2 100%   BMI 27.08 kg/m  SpO2: SpO2: 100 % O2 Device: O2 Device: Nasal Cannula O2 Flow Rate: O2 Flow Rate (L/min): 3 L/min Intake/output summary:  Intake/Output Summary (Last 24 hours) at 03/31/2023 0859 Last data filed at 03/31/2023 3875 Gross per 24 hour  Intake 749.71 ml  Output 600 ml  Net 149.71 ml   LBM: Last BM Date :  (patient is confused.) Baseline Weight: Weight: 76.1 kg Most recent weight: Weight: 76.1 kg  General: NAD, awake, moaning in pain Cardiovascular: RRR Respiratory: no increased work of breathing noted, not in respiratory distress Skin: Bandage in place over right flank drain Neuro: Awake  Palliative Performance Scale: 20%              Additional Data Reviewed: Recent Labs    03/30/23 0424 03/31/23 0518  WBC 19.1* 15.8*  HGB 8.5* 8.2*  PLT 393 371  NA 134* 134*  BUN 14 15  CREATININE 0.90 0.75    Imaging: CT ABDOMEN PELVIS W CONTRAST CLINICAL DATA:  Right lower quadrant pain  EXAM: CT ABDOMEN AND PELVIS WITH CONTRAST  TECHNIQUE: Multidetector CT imaging of the abdomen and pelvis was performed using the standard protocol following bolus administration of intravenous contrast.  RADIATION DOSE REDUCTION: This exam was performed according to the departmental dose-optimization program which includes automated exposure control, adjustment of the mA and/or kV according to patient size and/or use of iterative reconstruction technique.  CONTRAST:  75mL OMNIPAQUE IOHEXOL 300 MG/ML  SOLN  COMPARISON:  CT abdomen and pelvis 05/14/2018  FINDINGS: Lower chest: No acute  abnormality.  Hepatobiliary: No focal liver abnormality is seen. No gallstones, gallbladder wall thickening, or biliary dilatation.  Pancreas: Unremarkable. No pancreatic ductal dilatation or surrounding inflammatory changes.  Spleen:  Normal in size without focal abnormality.  Adrenals/Urinary Tract: There is a peripherally enhancing low-attenuation area in the right perirenal space lateral to the kidney with internal septations measuring 2.8 x 4.6 x 6.1 cm. Otherwise, the bilateral kidneys, adrenal glands, and bladder are within normal limits.  Stomach/Bowel: Circumferential heterogeneous mass is seen in the distal ascending colon near the hepatic flexure measuring proximally 7.1 by 6.5 by 5.5 cm. There surrounding inflammatory stranding. This mass abuts the peripherally enhancing complex collection abutting the right kidney. There is no bowel obstruction, pneumatosis or free air. The appendix is within normal limits. Sigmoid colon diverticula are present. Small bowel and stomach are within normal limits.  Vascular/Lymphatic: Aortic atherosclerosis. No enlarged abdominal or pelvic lymph nodes.  Reproductive: Status post hysterectomy. No adnexal masses.  Other: No abdominal wall hernia or abnormality. No abdominopelvic ascites.  Musculoskeletal: Degenerative changes affect the spine. No fracture or suspicious osseous lesion.  IMPRESSION: 1. Circumferential heterogeneous mass in the distal ascending colon near the hepatic flexure measuring up to 7.1 cm. Findings are worrisome for colon neoplasm. 2. Peripherally enhancing complex collection in the right perirenal space lateral to the kidney measuring up to 6.1 cm. This abuts the colonic mass. Findings may represent direct extension of tumor/necrotic tumor, metastatic disease or abscess. 3. Sigmoid colon diverticulosis. 4. Aortic atherosclerosis.  Aortic Atherosclerosis (ICD10-I70.0).  Electronically Signed   By: Darliss Cheney M.D.   On: 03/28/2023 22:46    I personally reviewed recent imaging.   Palliative Care Assessment and Plan Summary of Established Goals of Care and Medical Treatment Preferences   Patient is an 89 year old female with a past medical history of dementia, hypertension, GERD, and diabetes who was admitted on 03/28/2023 for management of right-sided flank pain.  Since admission, patient had imaging which demonstrated a circumferential mass in the distal ascending colon near the hepatic flexure measuring 7 cm and also on enhancing complex collection in the right perirenal space measuring 6 cm with concern for tumor or metastatic disease.  IR consulted for evaluation.  Palliative medicine team consulted to assist with complex medical decision making.  # Complex medical decision making/goals of care  -Patient moaning in pain and so unable to participate in complex medical decision making at time of visit.  -Discussed care with patient's son, Nelma Rothman, via phone as detailed above in HPI.  Reviewed patient's imaging showing colon mass is potential that it is already metastatic cancer.  Discussed possible pathways for medical care moving forward including pursuing aggressive medical intervention such as biopsy versus allowing patient to be comfortable and have time at home with family.  Son would like to speak to patient and family regarding this.  Son would like to have family meeting on Thursday, 04/02/2023 at 2 PM. PMT will continue to engage in conversations as able.   -At this time continuing appropriate medical interventions.  -  Code Status: Full Code   # Psycho-social/Spiritual Support:  - Support System: sons, multiple NOK in EMR  # Discharge Planning:  To Be Determined  Thank you for allowing the palliative care team to participate in the care Earlyn H Krausz.  Alvester Morin, DO Palliative Care Provider PMT # [phone]  If patient remains symptomatic despite maximum doses, please  call PMT at [phone] between 0700 and 1900. Outside of these hours, please call attending, as PMT does not have night coverage.  Personally spent 80 minutes in patient care including extensive chart review (labs, imaging, progress/consult notes, vital signs), medically appropraite  exam, discussed with treatment team, education to patient, family, and staff, documenting clinical information, medication review and management, coordination of care, and available advanced directive documents.

## 2023-03-31 NOTE — Progress Notes (Signed)
CBG 36  Per protocol administered 25 gm D50 IV @ 1158. MD notified.  Recheck 1215 CBG is 130

## 2023-04-01 ENCOUNTER — Inpatient Hospital Stay (HOSPITAL_COMMUNITY): Payer: Medicare PPO

## 2023-04-01 DIAGNOSIS — R7881 Bacteremia: Secondary | ICD-10-CM

## 2023-04-01 DIAGNOSIS — Z7189 Other specified counseling: Secondary | ICD-10-CM | POA: Diagnosis not present

## 2023-04-01 DIAGNOSIS — R188 Other ascites: Secondary | ICD-10-CM | POA: Diagnosis not present

## 2023-04-01 DIAGNOSIS — K6389 Other specified diseases of intestine: Secondary | ICD-10-CM | POA: Diagnosis not present

## 2023-04-01 DIAGNOSIS — Z515 Encounter for palliative care: Secondary | ICD-10-CM | POA: Diagnosis not present

## 2023-04-01 LAB — CBC WITH DIFFERENTIAL/PLATELET
Abs Immature Granulocytes: 0.19 10*3/uL — ABNORMAL HIGH (ref 0.00–0.07)
Basophils Absolute: 0.1 10*3/uL (ref 0.0–0.1)
Basophils Relative: 0 %
Eosinophils Absolute: 0.1 10*3/uL (ref 0.0–0.5)
Eosinophils Relative: 0 %
HCT: 29.6 % — ABNORMAL LOW (ref 36.0–46.0)
Hemoglobin: 9 g/dL — ABNORMAL LOW (ref 12.0–15.0)
Immature Granulocytes: 1 %
Lymphocytes Relative: 8 %
Lymphs Abs: 1.9 10*3/uL (ref 0.7–4.0)
MCH: 24.2 pg — ABNORMAL LOW (ref 26.0–34.0)
MCHC: 30.4 g/dL (ref 30.0–36.0)
MCV: 79.6 fL — ABNORMAL LOW (ref 80.0–100.0)
Monocytes Absolute: 2.4 10*3/uL — ABNORMAL HIGH (ref 0.1–1.0)
Monocytes Relative: 10 %
Neutro Abs: 18.8 10*3/uL — ABNORMAL HIGH (ref 1.7–7.7)
Neutrophils Relative %: 81 %
Platelets: 373 10*3/uL (ref 150–400)
RBC: 3.72 MIL/uL — ABNORMAL LOW (ref 3.87–5.11)
RDW: 18.7 % — ABNORMAL HIGH (ref 11.5–15.5)
WBC: 23.5 10*3/uL — ABNORMAL HIGH (ref 4.0–10.5)
nRBC: 0 % (ref 0.0–0.2)

## 2023-04-01 LAB — BASIC METABOLIC PANEL
Anion gap: 10 (ref 5–15)
BUN: 12 mg/dL (ref 8–23)
CO2: 22 mmol/L (ref 22–32)
Calcium: 8 mg/dL — ABNORMAL LOW (ref 8.9–10.3)
Chloride: 102 mmol/L (ref 98–111)
Creatinine, Ser: 1.13 mg/dL — ABNORMAL HIGH (ref 0.44–1.00)
GFR, Estimated: 47 mL/min — ABNORMAL LOW (ref >60)
Glucose, Bld: 223 mg/dL — ABNORMAL HIGH (ref 70–99)
Potassium: 4.6 mmol/L (ref 3.5–5.1)
Sodium: 134 mmol/L — ABNORMAL LOW (ref 135–145)

## 2023-04-01 LAB — ECHOCARDIOGRAM COMPLETE
Area-P 1/2: 4.24 cm2
S' Lateral: 2.6 cm
Weight: 2684.32 [oz_av]

## 2023-04-01 LAB — GLUCOSE, CAPILLARY
Glucose-Capillary: 147 mg/dL — ABNORMAL HIGH (ref 70–99)
Glucose-Capillary: 200 mg/dL — ABNORMAL HIGH (ref 70–99)
Glucose-Capillary: 201 mg/dL — ABNORMAL HIGH (ref 70–99)
Glucose-Capillary: 201 mg/dL — ABNORMAL HIGH (ref 70–99)
Glucose-Capillary: 220 mg/dL — ABNORMAL HIGH (ref 70–99)
Glucose-Capillary: 241 mg/dL — ABNORMAL HIGH (ref 70–99)

## 2023-04-01 NOTE — Progress Notes (Signed)
Referring Provider(s): Dr. Alan Mulder   Supervising Physician: Gilmer Mor  Patient Status:  [facility] - In-pt  Chief Complaint: Abdominal pain s/p drain placement seen for drain follow up   Brief History:  89 y.o. female with history of CHF, Diabetes mellitus, high cholesterol, HTN, and obesity. Patient presented to the ER via EMS 03/28/23 with hyperglycemia and c/o right flank pain. CT abdomen/pelvis 03/28/23 reported:   1. Circumferential heterogeneous mass in the distal ascending colon near the hepatic flexure measuring up to 7.1 cm. Findings are worrisome for colon neoplasm. 2. Peripherally enhancing complex collection in the right perirenal space lateral to the kidney measuring up to 6.1 cm. This abuts the colonic mass. Findings may represent direct extension of tumor/necrotic tumor, metastatic disease or abscess.  RUQ drain placed in IR on 03/31/23 by Dr. Mosie Epstein.  Subjective:  Patient is comfortable and eating breakfast on arrival to room.   Allergies: Tradjenta [linagliptin]  Medications: Prior to Admission medications   Medication Sig Start Date End Date Taking? Authorizing Provider  ASPIRIN EC ADULT LOW DOSE 81 MG tablet Take 81 mg by mouth daily. 02/09/23  Yes [provider]  CALCIUM + VITAMIN D3 600-5 MG-MCG TABS Take 1 tablet by mouth daily.   Yes [provider]  carvedilol (COREG) 6.25 MG tablet Take 6.25 mg by mouth 2 (two) times daily with a meal.   Yes [provider]  FEROSUL 325 (65 Fe) MG tablet Take 325 mg by mouth 2 (two) times daily.   Yes [provider]  furosemide (LASIX) 40 MG tablet Take 40 mg by mouth daily.   Yes [provider]  potassium chloride SA (KLOR-CON M) 20 MEQ tablet Take 20 mEq by mouth every morning. 03/09/23  Yes [provider]  telmisartan (MICARDIS) 80 MG tablet Take 80 mg by mouth every morning. 03/09/23  Yes [provider]  TRESIBA FLEXTOUCH 100 UNIT/ML FlexTouch  Pen Inject 6 Units into the skin in the morning. 02/05/23  Yes [provider]  metFORMIN (GLUCOPHAGE-XR) 500 MG 24 hr tablet Take 1 tablet (500 mg total) by mouth daily with breakfast. Patient not taking: Reported on 03/29/2023 01/21/23   Burnadette Pop, MD  pantoprazole (PROTONIX) 40 MG tablet Take 1 tablet (40 mg total) by mouth 2 (two) times daily. Patient not taking: Reported on 03/29/2023 01/20/23   Burnadette Pop, MD  polyethylene glycol (MIRALAX / GLYCOLAX) 17 g packet Take 17 g by mouth daily. Patient not taking: Reported on 03/29/2023 01/21/23   Burnadette Pop, MD     Vital Signs: BP 135/68 (BP Location: Left Arm)   Pulse 96   Temp 97.9 F (36.6 C)   Resp 18   Wt 167 lb 12.3 oz (76.1 kg)   SpO2 95%   BMI 27.08 kg/m   Physical Exam Vitals reviewed.  Constitutional:      Appearance: Normal appearance.  HENT:     Head: Normocephalic and atraumatic.     Mouth/Throat:     Mouth: Mucous membranes are moist.     Pharynx: Oropharynx is clear.  Cardiovascular:     Rate and Rhythm: Normal rate.  Pulmonary:     Effort: Pulmonary effort is normal.     Comments: On 2L [place] Abdominal:     General: Abdomen is flat.     Palpations: Abdomen is soft.     Comments: RUQ/Flank drain in place with overlying bandage in place. Bulb to suction. Skin is non-erythematous and intact. ~25cc  of serosanguinous fluid in bulb.  Musculoskeletal:        General: Normal range of motion.     Cervical back: Normal range of motion.  Skin:    General: Skin is warm and dry.  Neurological:     Mental Status: She is alert. Mental status is at baseline.  Psychiatric:        Mood and Affect: Mood normal.        Behavior: Behavior normal.    Drain Location: RUQ Size: Fr size: 10 Fr Date of placement: 03/31/23  Currently to: Drain collection device: suction bulb 24 hour output:  Output by Drain (mL) 03/30/23 0701 - 03/30/23 1900 03/30/23 1901 - 03/31/23 0700 03/31/23 0701 - 03/31/23 1900  03/31/23 1901 - 04/01/23 0700 04/01/23 0701 - 04/01/23 1402  Closed System Drain 1 Lateral RUQ Bulb (JP) 10 Fr.   20 15     Interval imaging/drain manipulation:  None  Current examination: Flushes/aspirates easily.  Insertion site unremarkable. Suture and stat lock in place. Dressed appropriately.    Labs:  CBC: Recent Labs    03/29/23 0401 03/30/23 0424 03/31/23 0518 04/01/23 0508  WBC 19.7* 19.1* 15.8* 23.5*  HGB 8.4* 8.5* 8.2* 9.0*  HCT 27.0* 27.9* 26.4* 29.6*  PLT 432* 393 371 373    COAGS: Recent Labs    03/29/23 0850  INR 1.2    BMP: Recent Labs    03/29/23 0401 03/30/23 0424 03/31/23 0518 04/01/23 0508  NA 132* 134* 134* 134*  K 4.4 4.4 4.0 4.6  CL 96* 100 100 102  CO2 26 25 25 22   GLUCOSE 254* 137* 154* 223*  BUN 19 14 15 12   CALCIUM 8.9 8.6* 8.2* 8.0*  CREATININE 0.75 0.90 0.75 1.13*  GFRNONAA >60 >60 >60 47*    LIVER FUNCTION TESTS: Recent Labs    11/19/22 1255 03/28/23 1641 03/29/23 0401  BILITOT 0.4 1.0 0.7  AST 11* 14* 15  ALT 6 12 11   ALKPHOS 51 71 63  PROT 6.6 7.2 6.6  ALBUMIN 3.3* 2.8* 2.4*    Assessment and Plan:  89 y.o. female with history of CHF, Diabetes mellitus, high cholesterol, HTN, and obesity. Patient presented to the ER via EMS 03/28/23 with hyperglycemia and c/o right flank pain. CT A/P 03/28/23 with concerns of mass in the distal ascending colon and complex collection in the right perirenal space concerning for tumor vs abscess. RUQ drain placed in IR on 03/31/23 by Dr. Mosie Epstein.  Continue TID flushes with 5 cc NS. Record output Q shift. Dressing changes QD or PRN if soiled.  Call IR APP or on call IR MD if difficulty flushing or sudden change in drain output.  Repeat imaging/possible drain injection once output < 10 mL/QD (excluding flush material). Consideration for drain removal if output is < 10 mL/QD (excluding flush material), pending discussion with the providing surgical service.  Discharge  planning: Please contact IR APP or on call IR MD prior to patient d/c to ensure appropriate follow up plans are in place. Typically patient will follow up with IR clinic 10-14 days post d/c for repeat imaging/possible drain injection. IR scheduler will contact patient with date/time of appointment. Patient will need to flush drain QD with 5 cc NS, record output QD, dressing changes every 2-3 days or earlier if soiled.   IR will continue to follow - please call with questions or concerns.  Electronically Signed: Jama Flavors, PA-C 04/01/2023, 1:56 PM    I spent a  total of 15 Minutes at the the patient's bedside AND on the patient's hospital floor or unit, greater than 50% of which was counseling/coordinating care for drain follow up.

## 2023-04-01 NOTE — Plan of Care (Signed)
  Problem: Education: Goal: Knowledge of General Education information will improve Description: Including pain rating scale, medication(s)/side effects and non-pharmacologic comfort measures 04/01/2023 1658 by Amil Amen, RN Outcome: Progressing 04/01/2023 1641 by Amil Amen, RN Outcome: Progressing   Problem: Health Behavior/Discharge Planning: Goal: Ability to manage health-related needs will improve 04/01/2023 1658 by Amil Amen, RN Outcome: Progressing 04/01/2023 1641 by Amil Amen, RN Outcome: Progressing   Problem: Clinical Measurements: Goal: Ability to maintain clinical measurements within normal limits will improve 04/01/2023 1658 by Amil Amen, RN Outcome: Progressing 04/01/2023 1641 by Amil Amen, RN Outcome: Progressing

## 2023-04-01 NOTE — Plan of Care (Signed)
?  Problem: Elimination: ?Goal: Will not experience complications related to urinary retention ?Outcome: Progressing ?  ?

## 2023-04-01 NOTE — Progress Notes (Signed)
Daily Progress Note   Patient Name: Helen Richardson       Date: 04/01/2023 DOB: 10/02/1933  Age: 89 y.o. MRN#: [id number] Attending Physician: Fran Lowes, DO Primary Care Physician: System, Provider Not In Admit Date: 03/28/2023 Length of Stay: 3 days  Reason for Consultation/Follow-up: Establishing goals of care  Subjective:   CC: Patient sleeping comfortably when seen today.  Following up regarding complex medical decision making.  Subjective:  Reviewed EMR prior to presenting to bedside.  ID following along for continued management of a abx in setting of bacteremia likely from intra-abdominal source with abscess status post drain placed 03/31/2023.  ID noting that patient would likely not be an appropriate candidate for long-term IV antibiotics and would instead prefer oral antibiotics.  Time of EMR review in past 24 hours patient has required as needed oxycodone 5 mg every 4 hours x 2 doses.  When presenting to bedside, patient sleeping comfortably in the bed so did not awaken.  No family present at bedside during visit.  Attempted to call patient's son Nelma Rothman in afternoon without answer.   I had already discussed with Daryl plan for family meeting on 04/02/2023 at 2 PM.  Objective:   Vital Signs:  BP 123/88 (BP Location: Left Arm)   Pulse 95   Temp 97.9 F (36.6 C)   Resp 16   Wt 76.1 kg   SpO2 (!) 84%   BMI 27.08 kg/m   Physical Exam: General: NAD, sleeping comfortably, chronically ill appearing  Cardiovascular: RRR Respiratory: no increased work of breathing noted, not in respiratory distress Skin: Bandage in place over right flank drain  Imaging: I personally reviewed recent imaging.   Assessment & Plan:   Assessment: Patient is an 89 year old female with a past medical history of dementia, hypertension, GERD, and diabetes who was admitted on 03/28/2023 for management of right-sided flank pain. Since admission, patient had imaging which demonstrated a  circumferential mass in the distal ascending colon near the hepatic flexure measuring 7 cm and also on enhancing complex collection in the right perirenal space measuring 6 cm with concern for tumor or metastatic disease. IR consulted for evaluation. Palliative medicine team consulted to assist with complex medical decision making.   Recommendations/Plan: # Complex medical decision making/goals of care:   -Patient sleeping comfortably so did not awaken with noted periods of delirium in setting of dementia at baseline.                 -Attempted to call patient's son, Nelma Rothman, again though no answer this afternoon.  Had already discussed care with him on 04/02/2023 regarding patient's medical conditions and possible pathways for medical care moving forward.  Darrell had wanted family at all family members could be present for discussion.  Family meeting is planned for Thursday, 04/02/2023 at 2 PM.  PMT will continue to engage in conversations as able.  At this time continuing appropriate medical interventions.      Code Status: Full Code  # Psychosocial Support:  - Support System: sons, multiple NOK in EMR   # Discharge Planning: To Be Determined   Thank you for allowing the palliative care team to participate in the care Markiah H Kimpel.  Alvester Morin, DO Palliative Care Provider PMT # [phone]  If patient remains symptomatic despite maximum doses, please call PMT at [phone] between 0700 and 1900. Outside of these hours, please call attending, as PMT does not have night coverage.  Personally spent 25 minutes in patient care  including extensive chart review (labs, imaging, progress/consult notes, vital signs), medically appropraite exam, discussed with treatment team, education to patient, family, and staff, documenting clinical information, medication review and management, coordination of care, and available advanced directive documents.

## 2023-04-01 NOTE — Progress Notes (Signed)
Subjective: No new complaints   Antibiotics:  Anti-infectives (From admission, onward)    Start     Dose/Rate Route Frequency Ordered Stop   03/31/23 1600  Ampicillin-Sulbactam (UNASYN) 3 g in sodium chloride 0.9 % 100 mL IVPB        3 g 200 mL/hr over 30 Minutes Intravenous Every 6 hours 03/31/23 1451     03/30/23 2300  ampicillin (OMNIPEN) 2 g in sodium chloride 0.9 % 100 mL IVPB  Status:  Discontinued        2 g 300 mL/hr over 20 Minutes Intravenous Every 6 hours 03/30/23 2204 03/31/23 1451   03/29/23 1000  ceFEPIme (MAXIPIME) 2 g in sodium chloride 0.9 % 100 mL IVPB  Status:  Discontinued        2 g 200 mL/hr over 30 Minutes Intravenous Every 12 hours 03/28/23 2348 03/29/23 0040   03/29/23 0000  vancomycin (VANCOCIN) IVPB 1000 mg/200 mL premix  Status:  Discontinued        1,000 mg 200 mL/hr over 60 Minutes Intravenous  Once 03/28/23 2348 03/29/23 0040   03/28/23 2215  cefTRIAXone (ROCEPHIN) 2 g in sodium chloride 0.9 % 100 mL IVPB        2 g 200 mL/hr over 30 Minutes Intravenous  Once 03/28/23 2203 03/29/23 0002       Medications: Scheduled Meds:  enoxaparin (LOVENOX) injection  40 mg Subcutaneous Q24H   glucagon (human recombinant)  1 mg Intramuscular STAT   insulin aspart  0-9 Units Subcutaneous TID WC   insulin glargine-yfgn  15 Units Subcutaneous Daily   pantoprazole  40 mg Oral BID   sodium chloride flush  5 mL Intracatheter Q8H   Continuous Infusions:  ampicillin-sulbactam (UNASYN) IV 3 g (04/01/23 0454)   dextrose 10 % and 0.45 % NaCl 1,000 mL with potassium chloride 40 mEq infusion 30 mL/hr at 03/31/23 1437   PRN Meds:.acetaminophen **OR** acetaminophen, fentaNYL, fentaNYL, lidocaine, midazolam, ondansetron **OR** ondansetron (ZOFRAN) IV, oxyCODONE    Objective: Weight change:   Intake/Output Summary (Last 24 hours) at 04/01/2023 0956 Last data filed at 04/01/2023 0847 Gross per 24 hour  Intake 1650.04 ml  Output 935 ml  Net 715.04 ml    Blood pressure 123/88, pulse 95, temperature 97.9 F (36.6 C), resp. rate 16, weight 76.1 kg, SpO2 (!) 84%. Temp:  [97.3 F (36.3 C)-99.1 F (37.3 C)] 97.9 F (36.6 C) (01/29 0819) Pulse Rate:  [68-126] 95 (01/29 0819) Resp:  [16-26] 16 (01/29 0819) BP: (99-188)/(37-103) 123/88 (01/29 0819) SpO2:  [80 %-100 %] 84 % (01/29 0819)  Physical Exam: Physical Exam Constitutional:      Appearance: She is ill-appearing.  Cardiovascular:     Rate and Rhythm: Normal rate.  Pulmonary:     Effort: No respiratory distress.     Breath sounds: No wheezing.  Neurological:     Mental Status: She is alert.  Psychiatric:        Attention and Perception: Attention normal.        Speech: Speech is delayed.        Behavior: Behavior is cooperative.        Cognition and Memory: Cognition is impaired. Memory is impaired. She exhibits impaired recent memory and impaired remote memory.     Drain in place  CBC:    BMET Recent Labs    03/31/23 0518 04/01/23 0508  NA 134* 134*  K 4.0 4.6  CL 100 102  CO2  25 22  GLUCOSE 154* 223*  BUN 15 12  CREATININE 0.75 1.13*  CALCIUM 8.2* 8.0*     Liver Panel  No results for input(s): "PROT", "ALBUMIN", "AST", "ALT", "ALKPHOS", "BILITOT", "BILIDIR", "IBILI" in the last 72 hours.     Sedimentation Rate No results for input(s): "ESRSEDRATE" in the last 72 hours. C-Reactive Protein No results for input(s): "CRP" in the last 72 hours.  Micro Results: Recent Results (from the past 720 hours)  Blood culture (routine x 2)     Status: None (Preliminary result)   Collection Time: 03/28/23 11:42 PM   Specimen: BLOOD  Result Value Ref Range Status   Specimen Description   Final    BLOOD RIGHT ANTECUBITAL Performed at [facility], [address], [place], [place] [address]    Special Requests   Final    Blood Culture adequate volume BOTTLES DRAWN AEROBIC AND ANAEROBIC Performed at [facility], [address] [place], [place] [address]    Culture   Final    NO GROWTH 3 DAYS Performed at [facility], [address], [place], [place] [address]    Report Status PENDING  Incomplete  Blood culture (routine x 2)     Status: Abnormal (Preliminary result)   Collection Time: 03/28/23 11:47 PM   Specimen: BLOOD  Result Value Ref Range Status   Specimen Description   Final    BLOOD LEFT ANTECUBITAL Performed at [facility], [address] [place], [place] [address]    Special Requests   Final    Blood Culture results may not be optimal due to an inadequate volume of blood received in culture bottles BOTTLES DRAWN AEROBIC AND ANAEROBIC Performed at [facility], [address], [place], [place] [address]    Culture  Setup Time   Final    GRAM POSITIVE COCCI IN CHAINS ANAEROBIC BOTTLE ONLY CRITICAL RESULT CALLED TO, READ BACK BY AND VERIFIED WITH: PHARMD L POINTDEXTER 03/30/2023 @ 2154 BY AB    Culture (A)  Final    ENTEROCOCCUS FAECALIS SUSCEPTIBILITIES TO FOLLOW Performed at [facility], [address] [place], [place] [address]    Report Status PENDING  Incomplete  Blood Culture ID Panel (Reflexed)     Status: Abnormal   Collection Time: 03/28/23 11:47 PM  Result Value Ref Range Status   Enterococcus faecalis DETECTED (A) NOT DETECTED Final    Comment: CRITICAL RESULT CALLED TO, READ BACK BY AND VERIFIED WITH: PHARMD L POINTDEXTER 03/30/2023 @ 2154 BY AB    Enterococcus Faecium NOT DETECTED NOT DETECTED Final   Listeria monocytogenes NOT DETECTED NOT DETECTED Final   Staphylococcus species NOT DETECTED NOT DETECTED Final   Staphylococcus aureus (BCID) NOT DETECTED NOT DETECTED Final   Staphylococcus epidermidis NOT DETECTED NOT DETECTED Final   Staphylococcus lugdunensis NOT DETECTED NOT DETECTED Final   Streptococcus species NOT DETECTED NOT DETECTED Final   Streptococcus agalactiae NOT DETECTED NOT DETECTED Final    Streptococcus pneumoniae NOT DETECTED NOT DETECTED Final   Streptococcus pyogenes NOT DETECTED NOT DETECTED Final   A.calcoaceticus-baumannii NOT DETECTED NOT DETECTED Final   Bacteroides fragilis NOT DETECTED NOT DETECTED Final   Enterobacterales NOT DETECTED NOT DETECTED Final   Enterobacter cloacae complex NOT DETECTED NOT DETECTED Final   Escherichia coli NOT DETECTED NOT DETECTED Final   Klebsiella aerogenes NOT DETECTED NOT DETECTED Final   Klebsiella oxytoca NOT DETECTED NOT DETECTED Final   Klebsiella pneumoniae  NOT DETECTED NOT DETECTED Final   Proteus species NOT DETECTED NOT DETECTED Final   Salmonella species NOT DETECTED NOT DETECTED Final   Serratia marcescens NOT DETECTED NOT DETECTED Final   Haemophilus influenzae NOT DETECTED NOT DETECTED Final   Neisseria meningitidis NOT DETECTED NOT DETECTED Final   Pseudomonas aeruginosa NOT DETECTED NOT DETECTED Final   Stenotrophomonas maltophilia NOT DETECTED NOT DETECTED Final   Candida albicans NOT DETECTED NOT DETECTED Final   Candida auris NOT DETECTED NOT DETECTED Final   Candida glabrata NOT DETECTED NOT DETECTED Final   Candida krusei NOT DETECTED NOT DETECTED Final   Candida parapsilosis NOT DETECTED NOT DETECTED Final   Candida tropicalis NOT DETECTED NOT DETECTED Final   Cryptococcus neoformans/gattii NOT DETECTED NOT DETECTED Final   Vancomycin resistance NOT DETECTED NOT DETECTED Final    Comment: Performed at [facility], [address], [place], [place] [address]  Culture, blood (Routine X 2) w Reflex to ID Panel     Status: None (Preliminary result)   Collection Time: 03/31/23  9:37 AM   Specimen: BLOOD  Result Value Ref Range Status   Specimen Description   Final    BLOOD BLOOD LEFT HAND Performed at [facility], [address], [place], [place] [address]    Special Requests   Final    BOTTLES DRAWN AEROBIC ONLY Blood Culture results may not be optimal due to an inadequate volume of  blood received in culture bottles Performed at [facility], [address], [place], [place] [address]    Culture   Final    NO GROWTH < 24 HOURS Performed at [facility], [address] [place], [place] [address]    Report Status PENDING  Incomplete  Culture, blood (Routine X 2) w Reflex to ID Panel     Status: None (Preliminary result)   Collection Time: 03/31/23  9:42 AM   Specimen: BLOOD  Result Value Ref Range Status   Specimen Description   Final    BLOOD BLOOD RIGHT HAND Performed at [facility], [address], [place], [place] [address]    Special Requests   Final    BOTTLES DRAWN AEROBIC ONLY Blood Culture results may not be optimal due to an inadequate volume of blood received in culture bottles Performed at [facility], [address] [place], [place] [address]    Culture   Final    NO GROWTH < 24 HOURS Performed at [facility], [address], [place], [place] [address]    Report Status PENDING  Incomplete  Aerobic/Anaerobic Culture w Gram Stain (surgical/deep wound)     Status: None (Preliminary result)   Collection Time: 03/31/23  4:42 PM   Specimen: Abdomen; Abscess  Result Value Ref Range Status   Specimen Description   Final    ABDOMEN Performed at [facility], [address] [place], [place] [address]    Special Requests   Final    NONE Performed at [facility], [address], [place], [place] [address]    Gram Stain   Final    ABUNDANT WBC PRESENT,BOTH PMN AND MONONUCLEAR ABUNDANT GRAM POSITIVE COCCI    Culture   Final    TOO YOUNG TO READ Performed at [facility], [address], [place], [place] [address]    Report Status PENDING  Incomplete  Culture, blood (Routine X 2) w Reflex to ID  Panel     Status: None (Preliminary result)   Collection Time: 03/31/23  5:22 PM   Specimen: BLOOD  Result Value Ref Range Status    Specimen Description   Final    BLOOD BLOOD RIGHT HAND Performed at [facility], [address] [place], [place] [address]    Special Requests   Final    BOTTLES DRAWN AEROBIC ONLY Blood Culture results may not be optimal due to an inadequate volume of blood received in culture bottles Performed at [facility], [address] [place], [place] [address]    Culture   Final    NO GROWTH < 12 HOURS Performed at [facility], [address], [place], [place] [address]    Report Status PENDING  Incomplete  Culture, blood (Routine X 2) w Reflex to ID Panel     Status: None (Preliminary result)   Collection Time: 03/31/23  5:34 PM   Specimen: BLOOD  Result Value Ref Range Status   Specimen Description   Final    BLOOD BLOOD LEFT HAND Performed at [facility], [address], [place], [place] [address]    Special Requests   Final    BOTTLES DRAWN AEROBIC ONLY Blood Culture results may not be optimal due to an inadequate volume of blood received in culture bottles Performed at [facility], [address] [place], [place] [address]    Culture   Final    NO GROWTH < 12 HOURS Performed at [facility], [address] [place], [place] [address]    Report Status PENDING  Incomplete    Studies/Results: CT GUIDED PERITONEAL/RETROPERITONEAL FLUID DRAIN BY PERC CATH Result Date: 03/31/2023 INDICATION: 89 year old female with imaging diagnosis of right-sided colon cancer, retroperitoneal abscess, referred for drainage EXAM: CT-GUIDED DRAINAGE OF ABDOMINAL ABSCESS TECHNIQUE: Multidetector CT imaging of the abdomen was performed following the standard protocol without IV contrast. RADIATION DOSE REDUCTION: This exam was performed according to the departmental dose-optimization program which includes automated exposure control, adjustment of the mA and/or kV according to patient size and/or use of iterative  reconstruction technique. MEDICATIONS: The patient is currently admitted to the hospital and receiving intravenous antibiotics. The antibiotics were administered within an appropriate time frame prior to the initiation of the procedure. ANESTHESIA/SEDATION: Moderate (conscious) sedation was employed during this procedure. A total of Versed 0.5 mg and Fentanyl 50 mcg was administered intravenously by the radiology nurse. Total intra-service moderate Sedation Time: 17 minutes. The patient's level of consciousness and vital signs were monitored continuously by radiology nursing throughout the procedure under my direct supervision. COMPLICATIONS: None PROCEDURE: Informed written consent was obtained from the patient after a thorough discussion of the procedural risks, benefits and alternatives. All questions were addressed. Maximal Sterile Barrier Technique was utilized including caps, mask, sterile gowns, sterile gloves, sterile drape, hand hygiene and skin antiseptic. A timeout was performed prior to the initiation of the procedure. Patient was positioned supine on the CT gantry table with scout CT acquired for planning purposes. We then needed to position the patient left decubitus. Repeat images were acquired in left decubitus position. The patient was then prepped and draped in the usual sterile fashion. 1% lidocaine was used for local anesthesia. Using CT guidance, trocar needle was advanced into the abscess adjacent to the right kidney. Modified Seldinger technique was used to then placed a 10 [place] drain. Approximately 25 cc of frankly purulent material aspirated for culture. Drain  was sutured in position attached to bulb suction. Final CT was acquired. Patient tolerated the procedure well and remained hemodynamically stable throughout. No complications were encountered and no significant blood loss. IMPRESSION: Status post CT-guided drainage of right abdominal abscess. Signed, Yvone Neu. Miachel Roux, RPVI  [facility] Electronically Signed   By: Gilmer Mor D.O.   On: 03/31/2023 15:05      Assessment/Plan:  INTERVAL HISTORY: Abscess with some gram-positive cocci seen   Principal Problem:   Intraabdominal fluid collection Active Problems:   Palliative care encounter   Colonic mass   Goals of care, counseling/discussion   Counseling and coordination of care   Need for emotional support    Helen Richardson is a 89 y.o. female with Helen Richardson dementia admitted the hospital in November with encephalopathy now again admitted with encephalopathy and abdominal pain found to have a E faecalis bacteremia as well as what appears to be a colon cancer associate with intra-abdominal abscess status post IR guided drainage.   #1 E faecalis bacteremia likely from intra-abdominal source with abscess:  Continue Unasyn for now.  Repeat blood cultures have been taken  The echocardiogram has been performed but not yet read formally  I would not pursue a TEE.  I do not think she would be an appropriate candidate for long-term IV antibiotics and would instead prefer oral antibiotics for her.  #2 possible colon cancer based on imaging:  Would not think she should be candidate for aggressive care here  #3 Dementia: she likely has some super imposed delirium that seems better today  #4 Goals of care; family meeting planned for Thursday and son is involved in discussions with palliative care.  They do not think she should be a full code at this point but obviously that is the call of the surgery decision maker   Evaluation of the patient requires complex antimicrobial therapy evaluation, counseling , isolation needs to reduce disease transmission and risk assessment and mitigation.      LOS: 3 days   Acey Lav 04/01/2023, 9:56 AM

## 2023-04-01 NOTE — Plan of Care (Signed)

## 2023-04-01 NOTE — Progress Notes (Signed)
PROGRESS NOTE  Helen FRIELING UJW:[id number] DOB: September 25, 1933 DOA: 03/28/2023 PCP: System, Provider Not In  Brief History   The patient is a 89 yr old woman who presented to [facility] ED on 03/28/2023 with complaints of right sided flank pain. The patient had had a previous admission in November 2024 for AMS after being found unresponsive. She was found to be hypoglycemic on that occasion. She gradually improved, and was discharged to outpatient follow-up. The patient states that she developed right sided flank pain and high glucoses in the past few days. Upon presentation her glucose was 629. [name] Kitchen She also had a WBC of 18, Hgb 8.8. and sodium 125. CT abdomen and pelvis which demonstrated a circumferential mass in the distal ascending colon near the hepatic flexure measuring 7 cm. There is also a enhancing complex collection in the right perirenal space measuring 6 cm with concern for tumor or metastatic disease. Interventional radiology was consulted in the emergency department for possible drain placement versus biopsy. Dr. Deanne Coffer was contacted. She was placed on insulin and started on antibiotics.   The patient has been evaluated by IR. As the patient herself is not capable of expressing herself, following commands, or making her own decisions. Her son was at bedside. The procedure's benefits and risks were  discussed with the family. Ultimately no drain was placed as the family did not give consent. Palliative care has been consulted.   On 03/31/2023 the patient's WBC has declined slightly to 15.7. The patient had been NPO after midnight. She was started on D5 NS at 75 cc/hr at 2100 on 03/30/2023. The next morning the patient was again hypoglycemic while awaiting surgery. The fluids were changed to D10 1/2 NS . She was taken for the procedure by IR the following afternoon. It seems that she has tolerated the procedure well. Fluids aspirated were sent for culture and ID has been consulted. Palliative care has  initiated discussions with the patient's family about their wishes regarding the mass.  Plan is for a family meeting on 04/02/2023 at 2:00 pm.  A & P  Intraabdominal fluid collection with colon mass: The patient has had a drain placed by IR today. She has tolerated the procedure well. Aspirated fluid has been sent for gram stain and culture. ID has been consulted. Palliative care has discussed the patient with the family and initiated discussions about what their wishes will be regarding the patient's colon mass. Her diet has now been restarted. WBC had declined from 19.1 to 15.8, but has increased again today to 23.5. This increase may be due to the drain placement yesterday.  She is receiving IV ampicillin. Blood cultures x 2 have been obtained. Gram stain is positive for PMN's and GPC. No speciation as of yet. The drain has put out about 25 cc since yesterday.    Flank pain likely related to malignancy Patient presented with abdominal pain found to have a large colon mass with adjacent fluid collection Likely related to metastatic disease or locally advanced colon cancer. The patient has had no prior colonoscopy. Palliative care has been consulted. Family meeting has been arranged for 2:00 pm on 04/02/2023. Oncology consult will be obtained once tissue diagnosis is made. WBC has declined from 19.1 to 15.8. The patient is receiving oxycodone for pain. She has used it four times since it was started on 03/31/2023.   Hyperglycemia in setting of poorly controlled type 2 diabetes Patient showed ketosis and hyperglycemia upon admission. However, no acidosis so concern  for DKA is low. Blood sugars greater than 600 on presentation, but 63 later the day of admission. Pt on Tresiba 6 units daily prior to presentation. On admission the patient was placed on 15 units of Semglee daily with SSI. Blood sugars 115 - 153 in the last 24 hours. HbA1c is 11.7.  Dementia Moderately advanced. The patient is not  situationally aware. She does not comprehend her medical situation and is unable to provide consent for procedures. IR has obtained consent from the patient's family for procedure tomorrow.  Bacteremia 1/2 bottles positive for e fecaelis. The patient is receiving IV ampicillin. Susceptibilities are pending. Will repeat cultures. WBC has declined from 19.1 to 15.8.  HTN The patient is normotensive without antihypertensives. Monitor.  GERD Continue Protonix  I have seen and examined this patient myself. I have spent 32 minutes in her evaluation and care.  DVT prophylaxis: Lovenox Code Status: Full Code Family Communication: None available Disposition Plan: tbd    Viola Kinnick, DO [facility] Direct contact: see [email or web address]  7PM-7AM contact night coverage as above 04/01/2023, 3:50 PM  LOS: 3 days   Consultants  IR General Surgery Palliative care  Procedures  None  Antibiotics   Anti-infectives (From admission, onward)    Start     Dose/Rate Route Frequency Ordered Stop   03/31/23 1600  Ampicillin-Sulbactam (UNASYN) 3 g in sodium chloride 0.9 % 100 mL IVPB        3 g 200 mL/hr over 30 Minutes Intravenous Every 6 hours 03/31/23 1451     03/30/23 2300  ampicillin (OMNIPEN) 2 g in sodium chloride 0.9 % 100 mL IVPB  Status:  Discontinued        2 g 300 mL/hr over 20 Minutes Intravenous Every 6 hours 03/30/23 2204 03/31/23 1451   03/29/23 1000  ceFEPIme (MAXIPIME) 2 g in sodium chloride 0.9 % 100 mL IVPB  Status:  Discontinued        2 g 200 mL/hr over 30 Minutes Intravenous Every 12 hours 03/28/23 2348 03/29/23 0040   03/29/23 0000  vancomycin (VANCOCIN) IVPB 1000 mg/200 mL premix  Status:  Discontinued        1,000 mg 200 mL/hr over 60 Minutes Intravenous  Once 03/28/23 2348 03/29/23 0040   03/28/23 2215  cefTRIAXone (ROCEPHIN) 2 g in sodium chloride 0.9 % 100 mL IVPB        2 g 200 mL/hr over 30 Minutes Intravenous  Once 03/28/23 2203 03/29/23 0002         Interval History/Subjective  The patient is sitting up in bed eating lunch without difficulty.  Objective   Vitals:  Vitals:   04/01/23 0819 04/01/23 1149  BP: 123/88 135/68  Pulse: 95 96  Resp: 16 18  Temp: 97.9 F (36.6 C) 97.9 F (36.6 C)  SpO2: (!) 84% 95%    Exam:  Constitutional:  The patient is somnolent. No acute distress. Respiratory:  No increased work of breathing. No wheezes, rales, or rhonchi No tactile fremitus Cardiovascular:  Regular rate and rhythm No murmurs, ectopy, or gallups. No lateral PMI. No thrills. Abdomen:  Abdomen is soft, non-distended. Positive for mild diffuse tenderness No hernias, masses, or organomegaly Normoactive bowel sounds.  Musculoskeletal:  No cyanosis, clubbing, or edema Skin:  No rashes, lesions, ulcers palpation of skin: no induration or nodules Neurologic:  CN 2-12 intact Sensation all 4 extremities intact Psychiatric:  Mental status Mood, affect appropriate Pleasantly confused   Scheduled Meds:  enoxaparin (LOVENOX) injection  40 mg Subcutaneous Q24H   insulin aspart  0-9 Units Subcutaneous TID WC   insulin glargine-yfgn  15 Units Subcutaneous Daily   pantoprazole  40 mg Oral BID   sodium chloride flush  5 mL Intracatheter Q8H   Continuous Infusions:  ampicillin-sulbactam (UNASYN) IV 3 g (04/01/23 0956)   dextrose 10 % and 0.45 % NaCl 1,000 mL with potassium chloride 40 mEq infusion 30 mL/hr at 03/31/23 1437    Principal Problem:   Intraabdominal fluid collection Active Problems:   Palliative care encounter   Colonic mass   Goals of care, counseling/discussion   Counseling and coordination of care   Need for emotional support   Bacteremia   LOS: 3 days

## 2023-04-02 ENCOUNTER — Other Ambulatory Visit: Payer: Self-pay

## 2023-04-02 DIAGNOSIS — R188 Other ascites: Secondary | ICD-10-CM | POA: Diagnosis not present

## 2023-04-02 DIAGNOSIS — Z515 Encounter for palliative care: Secondary | ICD-10-CM | POA: Diagnosis not present

## 2023-04-02 DIAGNOSIS — K6389 Other specified diseases of intestine: Secondary | ICD-10-CM | POA: Diagnosis not present

## 2023-04-02 DIAGNOSIS — R7881 Bacteremia: Secondary | ICD-10-CM | POA: Diagnosis not present

## 2023-04-02 DIAGNOSIS — Z7189 Other specified counseling: Secondary | ICD-10-CM | POA: Diagnosis not present

## 2023-04-02 LAB — CBC WITH DIFFERENTIAL/PLATELET
Abs Immature Granulocytes: 0.2 10*3/uL — ABNORMAL HIGH (ref 0.00–0.07)
Basophils Absolute: 0.1 10*3/uL (ref 0.0–0.1)
Basophils Relative: 0 %
Eosinophils Absolute: 0.2 10*3/uL (ref 0.0–0.5)
Eosinophils Relative: 1 %
HCT: 25.4 % — ABNORMAL LOW (ref 36.0–46.0)
Hemoglobin: 7.9 g/dL — ABNORMAL LOW (ref 12.0–15.0)
Immature Granulocytes: 1 %
Lymphocytes Relative: 11 %
Lymphs Abs: 1.8 10*3/uL (ref 0.7–4.0)
MCH: 23.9 pg — ABNORMAL LOW (ref 26.0–34.0)
MCHC: 31.1 g/dL (ref 30.0–36.0)
MCV: 77 fL — ABNORMAL LOW (ref 80.0–100.0)
Monocytes Absolute: 1.9 10*3/uL — ABNORMAL HIGH (ref 0.1–1.0)
Monocytes Relative: 11 %
Neutro Abs: 13.3 10*3/uL — ABNORMAL HIGH (ref 1.7–7.7)
Neutrophils Relative %: 76 %
Platelets: 386 10*3/uL (ref 150–400)
RBC: 3.3 MIL/uL — ABNORMAL LOW (ref 3.87–5.11)
RDW: 18.7 % — ABNORMAL HIGH (ref 11.5–15.5)
WBC: 17.5 10*3/uL — ABNORMAL HIGH (ref 4.0–10.5)
nRBC: 0 % (ref 0.0–0.2)

## 2023-04-02 LAB — GLUCOSE, CAPILLARY
Glucose-Capillary: 102 mg/dL — ABNORMAL HIGH (ref 70–99)
Glucose-Capillary: 105 mg/dL — ABNORMAL HIGH (ref 70–99)
Glucose-Capillary: 127 mg/dL — ABNORMAL HIGH (ref 70–99)
Glucose-Capillary: 145 mg/dL — ABNORMAL HIGH (ref 70–99)
Glucose-Capillary: 147 mg/dL — ABNORMAL HIGH (ref 70–99)
Glucose-Capillary: 55 mg/dL — ABNORMAL LOW (ref 70–99)
Glucose-Capillary: 64 mg/dL — ABNORMAL LOW (ref 70–99)
Glucose-Capillary: 65 mg/dL — ABNORMAL LOW (ref 70–99)
Glucose-Capillary: 69 mg/dL — ABNORMAL LOW (ref 70–99)
Glucose-Capillary: 90 mg/dL (ref 70–99)

## 2023-04-02 LAB — CULTURE, BLOOD (ROUTINE X 2)

## 2023-04-02 LAB — BASIC METABOLIC PANEL
Anion gap: 9 (ref 5–15)
BUN: 10 mg/dL (ref 8–23)
CO2: 24 mmol/L (ref 22–32)
Calcium: 8 mg/dL — ABNORMAL LOW (ref 8.9–10.3)
Chloride: 103 mmol/L (ref 98–111)
Creatinine, Ser: 1 mg/dL (ref 0.44–1.00)
GFR, Estimated: 54 mL/min — ABNORMAL LOW (ref >60)
Glucose, Bld: 63 mg/dL — ABNORMAL LOW (ref 70–99)
Potassium: 3.6 mmol/L (ref 3.5–5.1)
Sodium: 136 mmol/L (ref 135–145)

## 2023-04-02 NOTE — Progress Notes (Signed)
Subjective: No new complaints   Antibiotics:  Anti-infectives (From admission, onward)    Start     Dose/Rate Route Frequency Ordered Stop   03/31/23 1600  Ampicillin-Sulbactam (UNASYN) 3 g in sodium chloride 0.9 % 100 mL IVPB        3 g 200 mL/hr over 30 Minutes Intravenous Every 6 hours 03/31/23 1451     03/30/23 2300  ampicillin (OMNIPEN) 2 g in sodium chloride 0.9 % 100 mL IVPB  Status:  Discontinued        2 g 300 mL/hr over 20 Minutes Intravenous Every 6 hours 03/30/23 2204 03/31/23 1451   03/29/23 1000  ceFEPIme (MAXIPIME) 2 g in sodium chloride 0.9 % 100 mL IVPB  Status:  Discontinued        2 g 200 mL/hr over 30 Minutes Intravenous Every 12 hours 03/28/23 2348 03/29/23 0040   03/29/23 0000  vancomycin (VANCOCIN) IVPB 1000 mg/200 mL premix  Status:  Discontinued        1,000 mg 200 mL/hr over 60 Minutes Intravenous  Once 03/28/23 2348 03/29/23 0040   03/28/23 2215  cefTRIAXone (ROCEPHIN) 2 g in sodium chloride 0.9 % 100 mL IVPB        2 g 200 mL/hr over 30 Minutes Intravenous  Once 03/28/23 2203 03/29/23 0002       Medications: Scheduled Meds:  enoxaparin (LOVENOX) injection  40 mg Subcutaneous Q24H   insulin aspart  0-9 Units Subcutaneous TID WC   insulin glargine-yfgn  15 Units Subcutaneous Daily   pantoprazole  40 mg Oral BID   sodium chloride flush  5 mL Intracatheter Q8H   Continuous Infusions:  ampicillin-sulbactam (UNASYN) IV 3 g (04/02/23 1037)   dextrose 10 % and 0.45 % NaCl 1,000 mL with potassium chloride 40 mEq infusion 30 mL/hr at 03/31/23 1437   PRN Meds:.acetaminophen **OR** acetaminophen, fentaNYL, fentaNYL, lidocaine, midazolam, ondansetron **OR** ondansetron (ZOFRAN) IV, oxyCODONE    Objective: Weight change:   Intake/Output Summary (Last 24 hours) at 04/02/2023 1043 Last data filed at 04/02/2023 0938 Gross per 24 hour  Intake 1800.19 ml  Output 40 ml  Net 1760.19 ml   Blood pressure 119/85, pulse (!) 110, temperature 98.2 F  (36.8 C), temperature source Oral, resp. rate 19, weight 76.1 kg, SpO2 98%. Temp:  [97.9 F (36.6 C)-98.7 F (37.1 C)] 98.2 F (36.8 C) (01/30 0532) Pulse Rate:  [96-110] 110 (01/30 0532) Resp:  [15-19] 19 (01/30 0532) BP: (119-135)/(62-85) 119/85 (01/30 0532) SpO2:  [95 %-100 %] 98 % (01/30 0532)  Physical Exam: Physical Exam Pulmonary:     Effort: No respiratory distress.     Breath sounds: No wheezing.  Abdominal:     General: There is no distension.  Neurological:     Mental Status: She is alert. She is disoriented.  Psychiatric:        Cognition and Memory: Cognition is impaired. Memory is impaired. She exhibits impaired recent memory and impaired remote memory.     Drain in place  CBC:    BMET Recent Labs    04/01/23 0508 04/02/23 0455  NA 134* 136  K 4.6 3.6  CL 102 103  CO2 22 24  GLUCOSE 223* 63*  BUN 12 10  CREATININE 1.13* 1.00  CALCIUM 8.0* 8.0*     Liver Panel  No results for input(s): "PROT", "ALBUMIN", "AST", "ALT", "ALKPHOS", "BILITOT", "BILIDIR", "IBILI" in the last 72 hours.     Sedimentation Rate No  results for input(s): "ESRSEDRATE" in the last 72 hours. C-Reactive Protein No results for input(s): "CRP" in the last 72 hours.  Micro Results: Recent Results (from the past 720 hours)  Blood culture (routine x 2)     Status: None (Preliminary result)   Collection Time: 03/28/23 11:42 PM   Specimen: BLOOD  Result Value Ref Range Status   Specimen Description   Final    BLOOD RIGHT ANTECUBITAL Performed at [facility], [address] [place], [place] [address]    Special Requests   Final    Blood Culture adequate volume BOTTLES DRAWN AEROBIC AND ANAEROBIC Performed at [facility], [address], [place], [place] [address]    Culture   Final    NO GROWTH 4 DAYS Performed at [facility], [address], [place], [place] [address]    Report Status PENDING  Incomplete  Blood culture  (routine x 2)     Status: Abnormal   Collection Time: 03/28/23 11:47 PM   Specimen: BLOOD  Result Value Ref Range Status   Specimen Description   Final    BLOOD LEFT ANTECUBITAL Performed at [facility] [address] [place], [place] [address]    Special Requests   Final    Blood Culture results may not be optimal due to an inadequate volume of blood received in culture bottles BOTTLES DRAWN AEROBIC AND ANAEROBIC Performed at [facility], [address] [place], [place] [address]    Culture  Setup Time   Final    GRAM POSITIVE COCCI IN CHAINS ANAEROBIC BOTTLE ONLY CRITICAL RESULT CALLED TO, READ BACK BY AND VERIFIED WITH: PHARMD L POINTDEXTER 03/30/2023 @ 2154 BY AB Performed at [facility], [address] [place], [place] [address]    Culture ENTEROCOCCUS FAECALIS (A)  Final   Report Status 04/02/2023 FINAL  Final   Organism ID, Bacteria ENTEROCOCCUS FAECALIS  Final      Susceptibility   Enterococcus faecalis - MIC*    AMPICILLIN <=2 SENSITIVE Sensitive     VANCOMYCIN 1 SENSITIVE Sensitive     GENTAMICIN SYNERGY SENSITIVE Sensitive     * ENTEROCOCCUS FAECALIS  Blood Culture ID Panel (Reflexed)     Status: Abnormal   Collection Time: 03/28/23 11:47 PM  Result Value Ref Range Status   Enterococcus faecalis DETECTED (A) NOT DETECTED Final    Comment: CRITICAL RESULT CALLED TO, READ BACK BY AND VERIFIED WITH: PHARMD L POINTDEXTER 03/30/2023 @ 2154 BY AB    Enterococcus Faecium NOT DETECTED NOT DETECTED Final   Listeria monocytogenes NOT DETECTED NOT DETECTED Final   Staphylococcus species NOT DETECTED NOT DETECTED Final   Staphylococcus aureus (BCID) NOT DETECTED NOT DETECTED Final   Staphylococcus epidermidis NOT DETECTED NOT DETECTED Final   Staphylococcus lugdunensis NOT DETECTED NOT DETECTED Final   Streptococcus species NOT DETECTED NOT DETECTED Final   Streptococcus agalactiae NOT DETECTED NOT DETECTED Final    Streptococcus pneumoniae NOT DETECTED NOT DETECTED Final   Streptococcus pyogenes NOT DETECTED NOT DETECTED Final   A.calcoaceticus-baumannii NOT DETECTED NOT DETECTED Final   Bacteroides fragilis NOT DETECTED NOT DETECTED Final   Enterobacterales NOT DETECTED NOT DETECTED Final   Enterobacter cloacae complex NOT DETECTED NOT DETECTED Final   Escherichia coli NOT DETECTED NOT DETECTED Final   Klebsiella aerogenes NOT DETECTED NOT DETECTED Final   Klebsiella oxytoca NOT DETECTED NOT DETECTED Final   Klebsiella pneumoniae NOT DETECTED NOT DETECTED Final  Proteus species NOT DETECTED NOT DETECTED Final   Salmonella species NOT DETECTED NOT DETECTED Final   Serratia marcescens NOT DETECTED NOT DETECTED Final   Haemophilus influenzae NOT DETECTED NOT DETECTED Final   Neisseria meningitidis NOT DETECTED NOT DETECTED Final   Pseudomonas aeruginosa NOT DETECTED NOT DETECTED Final   Stenotrophomonas maltophilia NOT DETECTED NOT DETECTED Final   Candida albicans NOT DETECTED NOT DETECTED Final   Candida auris NOT DETECTED NOT DETECTED Final   Candida glabrata NOT DETECTED NOT DETECTED Final   Candida krusei NOT DETECTED NOT DETECTED Final   Candida parapsilosis NOT DETECTED NOT DETECTED Final   Candida tropicalis NOT DETECTED NOT DETECTED Final   Cryptococcus neoformans/gattii NOT DETECTED NOT DETECTED Final   Vancomycin resistance NOT DETECTED NOT DETECTED Final    Comment: Performed at [facility], [address], [place], [place] [address]  Culture, blood (Routine X 2) w Reflex to ID Panel     Status: None (Preliminary result)   Collection Time: 03/31/23  9:37 AM   Specimen: BLOOD  Result Value Ref Range Status   Specimen Description   Final    BLOOD BLOOD LEFT HAND Performed at [facility], [address] [place], [place] [address]    Special Requests   Final    BOTTLES DRAWN AEROBIC ONLY Blood Culture results may not be optimal due to an inadequate volume of  blood received in culture bottles Performed at [facility], [address], [place], [place] [address]    Culture   Final    NO GROWTH 2 DAYS Performed at [facility], [address] [place], [place] [address]    Report Status PENDING  Incomplete  Culture, blood (Routine X 2) w Reflex to ID Panel     Status: None (Preliminary result)   Collection Time: 03/31/23  9:42 AM   Specimen: BLOOD  Result Value Ref Range Status   Specimen Description   Final    BLOOD BLOOD RIGHT HAND Performed at [facility], [address], [place], [place] [address]    Special Requests   Final    BOTTLES DRAWN AEROBIC ONLY Blood Culture results may not be optimal due to an inadequate volume of blood received in culture bottles Performed at [facility], [address] [place], [place] [address]    Culture   Final    NO GROWTH 2 DAYS Performed at [facility], [address] [place], [place] [address]    Report Status PENDING  Incomplete  Aerobic/Anaerobic Culture w Gram Stain (surgical/deep wound)     Status: None (Preliminary result)   Collection Time: 03/31/23  4:42 PM   Specimen: Abdomen; Abscess  Result Value Ref Range Status   Specimen Description   Final    ABDOMEN Performed at [facility], [address], [place], [place] [address]    Special Requests   Final    NONE Performed at [facility], [address], [place], [place] [address]    Gram Stain   Final    ABUNDANT WBC PRESENT,BOTH PMN AND MONONUCLEAR ABUNDANT GRAM POSITIVE COCCI    Culture   Final    ABUNDANT GRAM NEGATIVE RODS CULTURE REINCUBATED FOR BETTER GROWTH SUSCEPTIBILITIES TO FOLLOW Performed at [facility], [address], [place], [place] [address]    Report Status PENDING  Incomplete  Culture, blood (Routine X 2) w Reflex to ID Panel  Status: None (Preliminary result)   Collection Time: 03/31/23   5:22 PM   Specimen: BLOOD  Result Value Ref Range Status   Specimen Description   Final    BLOOD BLOOD RIGHT HAND Performed at [facility], [address] [place], [place] [address]    Special Requests   Final    BOTTLES DRAWN AEROBIC ONLY Blood Culture results may not be optimal due to an inadequate volume of blood received in culture bottles Performed at [facility], [address], [place], [place] [address]    Culture   Final    NO GROWTH 2 DAYS Performed at [facility], [address], [place], [place] [address]    Report Status PENDING  Incomplete  Culture, blood (Routine X 2) w Reflex to ID Panel     Status: None (Preliminary result)   Collection Time: 03/31/23  5:34 PM   Specimen: BLOOD  Result Value Ref Range Status   Specimen Description   Final    BLOOD BLOOD LEFT HAND Performed at [facility], [address], [place], [place] [address]    Special Requests   Final    BOTTLES DRAWN AEROBIC ONLY Blood Culture results may not be optimal due to an inadequate volume of blood received in culture bottles Performed at [facility], [address], [place], [place] [address]    Culture   Final    NO GROWTH 2 DAYS Performed at [facility], [address], [place], [place] [address]    Report Status PENDING  Incomplete    Studies/Results: ECHOCARDIOGRAM COMPLETE Result Date: 04/01/2023    ECHOCARDIOGRAM REPORT   Patient Name:   Helen Richardson Date of Exam: 04/01/2023 Medical Rec #:  [id number]       Height:       66.0 in Accession #:    [id number]      Weight:       167.8 lb Date of Birth:  [birth year]        BSA:          1.856 m Patient Age:    89 years        BP:           124/61 mmHg Patient Gender: F               HR:           98 bpm. Exam Location:  Inpatient Procedure: 2D Echo, Cardiac Doppler and Color Doppler Indications:    Bacteremia  History:        Patient has prior history of  Echocardiogram examinations, most                 recent 08/16/2018.  Sonographer:    Harriette Bouillon RDCS Referring Phys: [id number] Isabel Ardila N [place] IMPRESSIONS  1. Left ventricular ejection fraction, by estimation, is 65 to 70%. The left ventricle has normal function. The left ventricle has no regional wall motion abnormalities. There is mild asymmetric left ventricular hypertrophy of the basal-septal segment. Left ventricular diastolic parameters are consistent with Grade I diastolic dysfunction (impaired relaxation).  2. Right ventricular systolic function is normal. The right ventricular size is normal. Tricuspid regurgitation signal is inadequate for assessing PA pressure.  3. The mitral valve is normal in structure. No evidence of mitral valve regurgitation. No evidence of mitral stenosis.  4. The aortic valve is tricuspid. There is mild calcification of the  aortic valve. Aortic valve regurgitation is not visualized. Aortic valve sclerosis is present, with no evidence of aortic valve stenosis.  5. The inferior vena cava is normal in size with <50% respiratory variability, suggesting right atrial pressure of 8 mmHg.  6. No valvular vegetation noted but valves not particularly well-visualized. FINDINGS  Left Ventricle: Left ventricular ejection fraction, by estimation, is 65 to 70%. The left ventricle has normal function. The left ventricle has no regional wall motion abnormalities. The left ventricular internal cavity size was normal in size. There is  mild asymmetric left ventricular hypertrophy of the basal-septal segment. Left ventricular diastolic parameters are consistent with Grade I diastolic dysfunction (impaired relaxation). Right Ventricle: The right ventricular size is normal. No increase in right ventricular wall thickness. Right ventricular systolic function is normal. Tricuspid regurgitation signal is inadequate for assessing PA pressure. Left Atrium: Left atrial size was normal in size. Right Atrium:  Right atrial size was normal in size. Pericardium: Trivial pericardial effusion is present. Mitral Valve: The mitral valve is normal in structure. There is mild calcification of the mitral valve leaflet(s). Mild mitral annular calcification. No evidence of mitral valve regurgitation. No evidence of mitral valve stenosis. Tricuspid Valve: The tricuspid valve is not well visualized. Tricuspid valve regurgitation is not demonstrated. Aortic Valve: The aortic valve is tricuspid. There is mild calcification of the aortic valve. Aortic valve regurgitation is not visualized. Aortic valve sclerosis is present, with no evidence of aortic valve stenosis. There is no evidence of aortic valve  vegetation. Pulmonic Valve: The pulmonic valve was normal in structure. Pulmonic valve regurgitation is trivial. Aorta: The aortic root is normal in size and structure. Venous: The inferior vena cava is normal in size with less than 50% respiratory variability, suggesting right atrial pressure of 8 mmHg. IAS/Shunts: No atrial level shunt detected by color flow Doppler.  LEFT VENTRICLE PLAX 2D LVIDd:         4.00 cm   Diastology LVIDs:         2.60 cm   LV e' medial:    3.26 cm/s LV PW:         0.80 cm   LV E/e' medial:  23.7 LV IVS:        0.90 cm   LV e' lateral:   6.31 cm/s LVOT diam:     2.00 cm   LV E/e' lateral: 12.2 LV SV:         37 LV SV Index:   20 LVOT Area:     3.14 cm  IVC IVC diam: 1.90 cm LEFT ATRIUM         Index LA diam:    2.30 cm 1.24 cm/m  AORTIC VALVE LVOT Vmax:   67.00 cm/s LVOT Vmean:  43.700 cm/s LVOT VTI:    0.119 m  AORTA Ao Root diam: 2.80 cm Ao Asc diam:  3.10 cm MITRAL VALVE MV Area (PHT): 4.24 cm    SHUNTS MV Decel Time: 179 msec    Systemic VTI:  0.12 m MV E velocity: 77.10 cm/s  Systemic Diam: 2.00 cm MV A velocity: 84.60 cm/s MV E/A ratio:  0.91 Dalton McleanMD Electronically signed by Wilfred Lacy Signature Date/Time: 04/01/2023/10:41:23 AM    Final    CT GUIDED PERITONEAL/RETROPERITONEAL FLUID  DRAIN BY PERC CATH Result Date: 03/31/2023 INDICATION: 89 year old female with imaging diagnosis of right-sided colon cancer, retroperitoneal abscess, referred for drainage EXAM: CT-GUIDED DRAINAGE OF ABDOMINAL ABSCESS TECHNIQUE: Multidetector CT imaging of the abdomen was performed  following the standard protocol without IV contrast. RADIATION DOSE REDUCTION: This exam was performed according to the departmental dose-optimization program which includes automated exposure control, adjustment of the mA and/or kV according to patient size and/or use of iterative reconstruction technique. MEDICATIONS: The patient is currently admitted to the hospital and receiving intravenous antibiotics. The antibiotics were administered within an appropriate time frame prior to the initiation of the procedure. ANESTHESIA/SEDATION: Moderate (conscious) sedation was employed during this procedure. A total of Versed 0.5 mg and Fentanyl 50 mcg was administered intravenously by the radiology nurse. Total intra-service moderate Sedation Time: 17 minutes. The patient's level of consciousness and vital signs were monitored continuously by radiology nursing throughout the procedure under my direct supervision. COMPLICATIONS: None PROCEDURE: Informed written consent was obtained from the patient after a thorough discussion of the procedural risks, benefits and alternatives. All questions were addressed. Maximal Sterile Barrier Technique was utilized including caps, mask, sterile gowns, sterile gloves, sterile drape, hand hygiene and skin antiseptic. A timeout was performed prior to the initiation of the procedure. Patient was positioned supine on the CT gantry table with scout CT acquired for planning purposes. We then needed to position the patient left decubitus. Repeat images were acquired in left decubitus position. The patient was then prepped and draped in the usual sterile fashion. 1% lidocaine was used for local anesthesia. Using CT  guidance, trocar needle was advanced into the abscess adjacent to the right kidney. Modified Seldinger technique was used to then placed a 10 [place] drain. Approximately 25 cc of frankly purulent material aspirated for culture. Drain was sutured in position attached to bulb suction. Final CT was acquired. Patient tolerated the procedure well and remained hemodynamically stable throughout. No complications were encountered and no significant blood loss. IMPRESSION: Status post CT-guided drainage of right abdominal abscess. Signed, Yvone Neu. Miachel Roux, RPVI [facility] Radiology Electronically Signed   By: Gilmer Mor D.O.   On: 03/31/2023 15:05      Assessment/Plan:  INTERVAL HISTORY: Abscess with some GNR seen on culture now despite GS   Principal Problem:   Intraabdominal fluid collection Active Problems:   Palliative care encounter   Colonic mass   Goals of care, counseling/discussion   Counseling and coordination of care   Need for emotional support   Bacteremia    Helen Richardson is a 89 y.o. female with Faylene Million dementia admitted the hospital in November with encephalopathy now again admitted with encephalopathy and abdominal pain found to have a E faecalis bacteremia as well as what appears to be a colon cancer associate with intra-abdominal abscess status post IR guided drainage.   #1 E faecalis bacteremia likely from intra-abdominal source with abscess:  Continue Unasyn for now though we need clarity re the GNR seen in the abdominal abscess is this an anerobe or aerobe?, we will get in touch with microbiology lab  Repeat blood cultures have been taken  Echocardiogram was suboptimal but did not show any evidence of endocarditis  I do not think she would be an appropriate candidate for long-term IV antibiotics and would instead prefer oral antibiotics for her.  #2  Colon cancer based on imaging:  I would strongly  recommend as below.  With the palliative care though at present idea of biopsies of mass is being considered possibly  #3 Dementia: she likely has some super imposed delirium that seems better today  #4 Goals of care; family meeting planned for today  I  have personally spent 50 minutes involved in face-to-face and non-face-to-face activities for this patient on the day of the visit. Professional time spent includes the following activities: Preparing to see the patient (review of tests), Obtaining and/or reviewing separately obtained history (admission/discharge record), Performing a medically appropriate examination and/or evaluation , Ordering medications/tests/procedures, referring and communicating with other health care professionals, Documenting clinical information in the EMR, Independently interpreting results (not separately reported), Communicating results to the patient/family/caregiver, Counseling and educating the patient/family/caregiver and Care coordination (not separately reported).   Evaluation of the patient requires complex antimicrobial therapy evaluation, counseling , isolation needs to reduce disease transmission and risk assessment and mitigation.      LOS: 4 days   Acey Lav 04/02/2023, 10:43 AM

## 2023-04-02 NOTE — Progress Notes (Signed)
Mobility Specialist - Progress Note   04/02/23 1324  Mobility  Activity Ambulated with assistance to bathroom  Level of Assistance Standby assist, set-up cues, supervision of patient - no hands on  Assistive Device Front wheel walker  Distance Ambulated (ft) 20 ft  Activity Response Tolerated well  Mobility Referral Yes  Mobility visit 1 Mobility  Mobility Specialist Start Time (ACUTE ONLY) 1301  Mobility Specialist Stop Time (ACUTE ONLY) 1323  Mobility Specialist Time Calculation (min) (ACUTE ONLY) 22 min   Pt received in bathroom and agreeable to transfer to the recliner. Pt fatigued w/ exertion. Required several verbal cues for general direction. No complaints during session. Pt to recliner after session with all needs met.    [facility]

## 2023-04-02 NOTE — Plan of Care (Signed)
Problem: Activity: Goal: Risk for activity intolerance will decrease Outcome: Progressing   Problem: Nutrition: Goal: Adequate nutrition will be maintained Outcome: Progressing

## 2023-04-02 NOTE — Progress Notes (Signed)
Hypoglycemic Event  CBG: 55  Treatment: 8 oz orange juice and crackers.  Symptoms: none.  Follow-up CBG: Time:0540 CBG Result:69  Follow up cbg time: 0625- 105  Possible Reasons for Event:  inadequate intake.  Comments/MD notified:Yes Anthoney Harada, NP    Nestor Wieneke H

## 2023-04-02 NOTE — Progress Notes (Signed)
Daily Progress Note   Patient Name: Helen Richardson       Date: 04/02/2023 DOB: [birth year]  Age: 89 y.o. MRN#: [id number] Attending Physician: Fran Lowes, DO Primary Care Physician: System, Provider Not In Admit Date: 03/28/2023 Length of Stay: 4 days  Reason for Consultation/Follow-up: Establishing goals of care  Subjective:   CC: Patient sleeping comfortably in bed.  Following up regarding complex medical decision making.  Subjective:  Reviewed EMR prior to presenting to bedside.  In past 24 hours has received oxycodone 5 mg x 1 dose.  Leukocytosis down trending to 17.5. ID continuing to follow and plans to discuss with microbiology lab regarding appropriate antibiotics use moving forward.  Echocardiogram performed was suboptimal although did not show any evidence of endocarditis.  Again ID does not think the patient would be an appropriate candidate for long-term IV antibiotics and would instead prefer oral antibiotics.  Presented to bedside for family meeting at 2 PM which had been previously scheduled.  Was able to discuss care with patient's 2 sons and niece at bedside as per her permission.  Patient sleeping comfortably in bed throughout conversation.  Introduced myself and my role as a member of the palliative medicine team in patient's medical journey.  Spent time reviewing patient's medical course during hospitalization.  This included imaging regarding abscess and mass in colon.  Spent time extensively discussing possible pathways for medical care moving forward including pursuing biopsy versus not pursuing biopsy and the risk and benefits associated with that; continuing with aggressive medical interventions versus focusing on comfort; pursuing rehab; pursuing going home with hospice.  Spent time answering questions as able regarding these different pathways.  Discussed concern based on patient's functional status, would be precluded from cancer directed therapies even if biopsy was  obtained as would not want to cause more harm than benefit with cancer directed therapies.  Prior to hospitalization patient had been at home with caregiver support.  Patient now functionally weekend.  At this time family voices will not be pursuing biopsy.  Family would like patient to work with PT and OT determine if she could go to rehab to regain strength.  Should they feel its appropriate in the future, would be willing to revisit discussion about biopsy.  At this time discussed we will be continuing appropriate medical interventions including antibiotics.  Noted ID continuing to manage and explained that patient is not appropriate for long-term IV antibiotics though can continue to take oral antibiotics.    With permission, also able to discuss CODE STATUS.  Explained full code versus DNR.  Expressed concern that should patient be sick enough that her heart were to physically stop or she were to stop breathing, interventions such as cardiac resuscitation and intubation with mechanical ventilation for life support would not lead to quality of life outcomes in this patient with that currently poor functional status and likely cancer.  Family asking time to consider this before making any decisions.  Patient will remain full code at this time as per family's request.    Spent time answering questions as able.  Provided emotional support via active listening.  Updated IDT regarding discussion with patient's family. Objective:   Vital Signs:  BP 119/85   Pulse (!) 110   Temp 98.2 F (36.8 C) (Oral)   Resp 19   Wt 76.1 kg   SpO2 98%   BMI 27.08 kg/m   Physical Exam: General: NAD, sleeping comfortably, chronically ill appearing  Cardiovascular: RRR Respiratory: no  increased work of breathing noted, not in respiratory distress Skin: Bandage in place over right flank drain  Imaging: I personally reviewed recent imaging.   Assessment & Plan:   Assessment: Patient is an 89 year old female  with a past medical history of dementia, hypertension, GERD, and diabetes who was admitted on 03/28/2023 for management of right-sided flank pain. Since admission, patient had imaging which demonstrated a circumferential mass in the distal ascending colon near the hepatic flexure measuring 7 cm and also on enhancing complex collection in the right perirenal space measuring 6 cm with concern for tumor or metastatic disease. IR consulted for evaluation. Palliative medicine team consulted to assist with complex medical decision making.   Recommendations/Plan: # Complex medical decision making/goals of care:   -Patient sleeping comfortably during conversation with family at bedside.  Patient has episodes of confusion at times in setting of underlying cognitive impairment and so unable to participate in complex medical decision making currently.   -Discussed care with patient's 2 sons and niece at bedside as detailed above in HPI.  Reviewed patient's medical course.  Discussed possible pathways for medical care moving forward including pursuing biopsy versus not pursuing biopsy and the risk and benefits associated with that; continuing with aggressive medical interventions versus focusing on comfort; pursuing rehab; pursuing going home with hospice.  At this time family elects to continue current medical interventions.  Family not actively pursuing biopsy at this time.  Family would like to have PT/OT evaluate patient for potential rehab placement.  TOC involved to assist with discharge planning.    Code Status: Full Code   -Discussed CODE STATUS with 2 sons and niece as detailed above in HPI.  Explained full code versus DNR.  Expressed concern that should patient be sick enough that her heart were to stop or she were to stop breathing, interventions such as cardiac resuscitation and intubation with mechanical ventilation on life support would not lead to quality of life outcomes for patient.  Family considering  CODE STATUS at this time.  Patient to remain full code currently.  # Psychosocial Support:  - Support System: sons, multiple NOK in EMR   # Discharge Planning: To Be Determined  Thank you for allowing the palliative care team to participate in the care Simi H Reitan.  Alvester Morin, DO Palliative Care Provider PMT # [phone]  If patient remains symptomatic despite maximum doses, please call PMT at [phone] between 0700 and 1900. Outside of these hours, please call attending, as PMT does not have night coverage.  Personally spent 65 minutes in patient care including extensive chart review (labs, imaging, progress/consult notes, vital signs), medically appropraite exam, discussed with treatment team, education to patient, family, and staff, documenting clinical information, medication review and management, coordination of care, and available advanced directive documents.

## 2023-04-02 NOTE — Progress Notes (Signed)
PROGRESS NOTE  Helen Richardson ZOX:[id number] DOB: 1934/01/10 DOA: 03/28/2023 PCP: System, Provider Not In  Brief History   The patient is a 89 yr old woman who presented to [facility] ED on 03/28/2023 with complaints of right sided flank pain. The patient had had a previous admission in November 2024 for AMS after being found unresponsive. She was found to be hypoglycemic on that occasion. She gradually improved, and was discharged to outpatient follow-up. The patient states that she developed right sided flank pain and high glucoses in the past few days. Upon presentation her glucose was 629. [name] Kitchen She also had a WBC of 18, Hgb 8.8. and sodium 125. CT abdomen and pelvis which demonstrated a circumferential mass in the distal ascending colon near the hepatic flexure measuring 7 cm. There is also a enhancing complex collection in the right perirenal space measuring 6 cm with concern for tumor or metastatic disease. Interventional radiology was consulted in the emergency department for possible drain placement versus biopsy. Dr. Deanne Coffer was contacted. She was placed on insulin and started on antibiotics.   The patient has been evaluated by IR. As the patient herself is not capable of expressing herself, following commands, or making her own decisions. Her son was at bedside. The procedure's benefits and risks were  discussed with the family. Ultimately no drain was placed as the family did not give consent. Palliative care has been consulted.   The patient had a drain placed by IR on 03/31/2023. Her WBC briefly increased, but then proceeded to decline. On 04/02/2023 Palliative care had a family meeting. In this meeting the family stated that they would not be pursuing a biopsy at this point. The family wishes for the patient to work with PT and perhaps go on to rehab. They might want to revisit biopsy in the future. ID has recommended that the patient is not appropriate for long term antibiotics.. They feel that she is  better suited for oral antibiotics. The family wishes to continue the patient's CODE STATUS as full code until they discuss the matter more fully as a family.   A & P  Intraabdominal fluid collection with colon mass: The patient has had a drain placed by IR today. She has tolerated the procedure well. Aspirated fluid has been sent for gram stain and culture. ID has been consulted. Palliative care has discussed the patient with the family and initiated discussions about what their wishes will be regarding the patient's colon mass. Her diet has now been restarted. WBC had declined from 19.1 to 15.8, but has increased again today to 23.5. This increase may be due to the drain placement yesterday.  She is receiving IV ampicillin. Blood cultures x 2 have been obtained. Gram stain is positive for PMN's and GPC. No speciation as of yet. The drain has put out about 25 cc since yesterday.    Flank pain likely related to malignancy Patient presented with abdominal pain found to have a large colon mass with adjacent fluid collection Likely related to metastatic disease or locally advanced colon cancer. The patient has had no prior colonoscopy. Palliative care has been consulted. Family meeting has been arranged for 2:00 pm on 04/02/2023. Oncology consult will be obtained once tissue diagnosis is made. WBC has declined from 19.1 to 15.8. The patient is receiving oxycodone for pain. She has used it four times since it was started on 03/31/2023.   Hyperglycemia in setting of poorly controlled type 2 diabetes Patient showed ketosis and hyperglycemia  upon admission. However, no acidosis so concern for DKA is low. Blood sugars greater than 600 on presentation, but 63 later the day of admission. Pt on Tresiba 6 units daily prior to presentation. On admission the patient was placed on 15 units of Semglee daily with SSI. Blood sugars 115 - 153 in the last 24 hours. HbA1c is 11.7.  Dementia Moderately advanced. The  patient is not situationally aware. She does not comprehend her medical situation and is unable to provide consent for procedures.   Bacteremia 1/2 bottles positive for e fecaelis. The patient is receiving IV ampicillin. Susceptibilities are pending. Will repeat cultures. WBC has declined from 19.1 to 15.8. Continue unasyn for now.   HTN The patient is normotensive without antihypertensives. Monitor.  GERD Continue Protonix  I have seen and examined this patient myself. I have spent 32 minutes in her evaluation and care.  DVT prophylaxis: Lovenox Code Status: Full Code Family Communication: None available Disposition Plan: tbd    Helen Rainone, DO [facility] Direct contact: see [email or web address]  7PM-7AM contact night coverage as above 04/02/2023, 4:10 PM  LOS: 4 days   Consultants  IR General Surgery Palliative care  Procedures  None  Antibiotics   Anti-infectives (From admission, onward)    Start     Dose/Rate Route Frequency Ordered Stop   03/31/23 1600  Ampicillin-Sulbactam (UNASYN) 3 g in sodium chloride 0.9 % 100 mL IVPB        3 g 200 mL/hr over 30 Minutes Intravenous Every 6 hours 03/31/23 1451     03/30/23 2300  ampicillin (OMNIPEN) 2 g in sodium chloride 0.9 % 100 mL IVPB  Status:  Discontinued        2 g 300 mL/hr over 20 Minutes Intravenous Every 6 hours 03/30/23 2204 03/31/23 1451   03/29/23 1000  ceFEPIme (MAXIPIME) 2 g in sodium chloride 0.9 % 100 mL IVPB  Status:  Discontinued        2 g 200 mL/hr over 30 Minutes Intravenous Every 12 hours 03/28/23 2348 03/29/23 0040   03/29/23 0000  vancomycin (VANCOCIN) IVPB 1000 mg/200 mL premix  Status:  Discontinued        1,000 mg 200 mL/hr over 60 Minutes Intravenous  Once 03/28/23 2348 03/29/23 0040   03/28/23 2215  cefTRIAXone (ROCEPHIN) 2 g in sodium chloride 0.9 % 100 mL IVPB        2 g 200 mL/hr over 30 Minutes Intravenous  Once 03/28/23 2203 03/29/23 0002        Interval History/Subjective  The  patient is sitting up in bed eating lunch without difficulty.  Objective   Vitals:  Vitals:   04/02/23 0532 04/02/23 1257  BP: 119/85 (!) 140/73  Pulse: (!) 110 (!) 104  Resp: 19 18  Temp: 98.2 F (36.8 C) 98.4 F (36.9 C)  SpO2: 98% 100%    Exam:  Constitutional:  The patient is somnolent. No acute distress. Respiratory:  No increased work of breathing. No wheezes, rales, or rhonchi No tactile fremitus Cardiovascular:  Regular rate and rhythm No murmurs, ectopy, or gallups. No lateral PMI. No thrills. Abdomen:  Abdomen is soft, non-distended. Positive for mild diffuse tenderness No hernias, masses, or organomegaly Normoactive bowel sounds.  Musculoskeletal:  No cyanosis, clubbing, or edema Skin:  No rashes, lesions, ulcers palpation of skin: no induration or nodules Neurologic:  CN 2-12 intact Sensation all 4 extremities intact Psychiatric:  Mental status Mood, affect appropriate Pleasantly confused   Scheduled Meds:  enoxaparin (LOVENOX) injection  40 mg Subcutaneous Q24H   insulin aspart  0-9 Units Subcutaneous TID WC   insulin glargine-yfgn  15 Units Subcutaneous Daily   pantoprazole  40 mg Oral BID   sodium chloride flush  5 mL Intracatheter Q8H   Continuous Infusions:  ampicillin-sulbactam (UNASYN) IV 3 g (04/02/23 1532)   dextrose 10 % and 0.45 % NaCl 1,000 mL with potassium chloride 40 mEq infusion 30 mL/hr at 03/31/23 1437    Principal Problem:   Intraabdominal fluid collection Active Problems:   Palliative care encounter   Colonic mass   Goals of care, counseling/discussion   Counseling and coordination of care   Need for emotional support   Bacteremia   DNR (do not resuscitate) discussion   LOS: 4 days

## 2023-04-03 DIAGNOSIS — R188 Other ascites: Secondary | ICD-10-CM | POA: Diagnosis not present

## 2023-04-03 DIAGNOSIS — Z7189 Other specified counseling: Secondary | ICD-10-CM | POA: Diagnosis not present

## 2023-04-03 DIAGNOSIS — K6389 Other specified diseases of intestine: Secondary | ICD-10-CM | POA: Diagnosis not present

## 2023-04-03 DIAGNOSIS — R7881 Bacteremia: Secondary | ICD-10-CM | POA: Diagnosis not present

## 2023-04-03 LAB — GLUCOSE, CAPILLARY
Glucose-Capillary: 111 mg/dL — ABNORMAL HIGH (ref 70–99)
Glucose-Capillary: 141 mg/dL — ABNORMAL HIGH (ref 70–99)
Glucose-Capillary: 142 mg/dL — ABNORMAL HIGH (ref 70–99)
Glucose-Capillary: 146 mg/dL — ABNORMAL HIGH (ref 70–99)
Glucose-Capillary: 69 mg/dL — ABNORMAL LOW (ref 70–99)
Glucose-Capillary: 82 mg/dL (ref 70–99)
Glucose-Capillary: 96 mg/dL (ref 70–99)
Glucose-Capillary: 98 mg/dL (ref 70–99)

## 2023-04-03 LAB — CULTURE, BLOOD (ROUTINE X 2)
Culture: NO GROWTH
Special Requests: ADEQUATE

## 2023-04-03 NOTE — Evaluation (Signed)
Occupational Therapy Evaluation Patient Details Name: Helen Richardson MRN: [id number] DOB: 1933/05/23 Today's Date: 04/03/2023   History of Present Illness Pt is an 89 y/o F.  Patient presented to the ER via EMS 03/28/23 with hyperglycemia and c/o right flank pain. CT abdomen/pelvis 03/28/23 reported:      1. Circumferential heterogeneous mass in the distal ascending colon  near the hepatic flexure measuring up to 7.1 cm. Findings are  worrisome for colon neoplasm.  2. Peripherally enhancing complex collection in the right perirenal  space lateral to the kidney measuring up to 6.1 cm. This abuts the  colonic mass. Findings may represent direct extension of  tumor/necrotic tumor, metastatic disease or abscess.  RUQ drain placed in IR on 03/31/23    PMH of acute encephalopathy  hypoglycemia, DM, CHF, HTN, high cholesterol.   Clinical Impression   Patient is currently requiring assistance with ADLs including Total assist +2 with Lower body ADLs, up to Total  assist with Upper body ADLs,  as well as  Total assist with bed mobility and Maximum assist with sit to stand transfer and inability to take any steps.   Current level of function is significantly below patient's last OT Evaluation in November 2024 and pt unable to offer any information this session including last name and DOB.    During this evaluation, patient was limited by pain, eneralized weakness, impaired activity tolerance, obesity, and cognitive deficits with inability to give any information or follow most commands. Pt did wipe face with complete setup and Min As hand over hand to initiate at bed level. Otherwise, pt was Max to Total Assist with all mobility and ADLs, all of which has the potential to impact patient's safety and independence during functional mobility, as well as performance for ADLs.    Patient unable to give home information. Per November pt was also an unreliable historian but able to give some information including use of a  RW and having a HH Aide at times. Patient demonstrates fair/guarded rehab potential, and should benefit from continued skilled occupational therapy services while in acute care to maximize safety, independence and quality of life at home.  Continued occupational therapy services are recommended in acute care. Patient will benefit from continued inpatient follow up therapy, <3 hours/day  ?        If plan is discharge home, recommend the following: Supervision due to cognitive status;Assistance with cooking/housework;Assistance with feeding;Assist for transportation;A lot of help with bathing/dressing/bathroom;A lot of help with walking and/or transfers;Two people to help with walking and/or transfers;Two people to help with bathing/dressing/bathroom;Direct supervision/assist for medications management;Help with stairs or ramp for entrance    Functional Status Assessment  Patient has had a recent decline in their functional status and demonstrates the ability to make significant improvements in function in a reasonable and predictable amount of time.  Equipment Recommendations   (TBD. Pt unable to give information. From last OT Evaluation, has RW.)    Recommendations for Other Services PT consult     Precautions / Restrictions Precautions Precautions: Fall Precaution Comments: JP drain      Mobility Bed Mobility Overal bed mobility: Needs Assistance Bed Mobility: Supine to Sit, Sit to Supine     Supine to sit: Max assist, Used rails, HOB elevated, Total assist Sit to supine: Used rails, Max assist   General bed mobility comments: Pt cannot maintain grasp on bedrails to assist with bed mobility. Pt was able to assist with sit to supine by raising one  of her LEs onto bed.    Transfers                          Balance Overall balance assessment: Needs assistance Sitting-balance support: Feet supported, Bilateral upper extremity supported Sitting balance-[name]:  Poor     Standing balance support: Bilateral upper extremity supported, Reliant on assistive device for balance, During functional activity Standing balance-[name]: Zero                             ADL either performed or assessed with clinical judgement   ADL Overall ADL's : Needs assistance/impaired Eating/Feeding: Maximal assistance;Total assistance;Bed level;Cueing for sequencing;Cueing for safety Eating/Feeding Details (indicate cue type and reason): External assist needed to drink from cup. Pt unable to extend arm to table and grasp cup. Requierd cup bnrought to chest and pt held with B hands and still required assistance. Grooming: Wash/dry face;Minimal assistance;Bed level;Cueing for sequencing   Upper Body Bathing: Total assistance;Bed level   Lower Body Bathing: Total assistance;+2 for physical assistance;Bed level   Upper Body Dressing : Total assistance;Sitting Upper Body Dressing Details (indicate cue type and reason): Pt following no instructions to sequence UE dressing and not raising arms to assist with threading into sleeve. Lower Body Dressing: Bed level;Total assistance Lower Body Dressing Details (indicate cue type and reason): To don socks. Toilet Transfer: Maximal assistance;Rolling walker (2 wheels);Cueing for sequencing;Cueing for safety Toilet Transfer Details (indicate cue type and reason): Pt stood from EOB to RW with Max As of 1. Pt unable to take steps and sat impulsively back to EOB without warning and with Max As for safety. Toileting- Clothing Manipulation and Hygiene: Total assistance;+2 for physical assistance;Bed level       Functional mobility during ADLs: Maximal assistance;Total assistance;Rolling walker (2 wheels);Cueing for safety;Cueing for sequencing;+2 for physical assistance       Vision   Additional Comments: Unable to assess. Pt with unfocused gaze. Poor visual atetntion to tasks or therapist. Gazing around room as if  delirious.     Perception         Praxis         Pertinent Vitals/Pain Pain Assessment Pain Assessment: PAINAD Breathing: occasional labored breathing, short period of hyperventilation Negative Vocalization: occasional moan/groan, low speech, negative/disapproving quality Facial Expression: facial grimacing Body Language: tense, distressed pacing, fidgeting Consolability: unable to console, distract or reassure PAINAD Score: 7 Pain Intervention(s): Limited activity within patient's tolerance, Monitored during session, Repositioned, Patient requesting pain meds-RN notified (Used call bell to request pain meds)     Extremity/Trunk Assessment Upper Extremity Assessment Upper Extremity Assessment: LUE deficits/detail;RUE deficits/detail (Pt could not disclose hand dominance) RUE Deficits / Details: Unable to fully assess due to cognitive deficits and pain. Pt guarding movement of UEs. Very limited grip strength for use of bed rails and RW. Unable to hold cup in B hands without external assitance. RUE: Unable to fully assess due to pain RUE Coordination: decreased fine motor;decreased gross motor LUE Deficits / Details: Unable to fully assess due to cognitive deficits and pain. Pt guarding movement of UEs. Very limited grip strength for use of bed rails and RW. Unable to hold cup in B hands without external assitance. LUE: Unable to fully assess due to pain LUE Coordination: decreased gross motor;decreased fine motor   Lower Extremity Assessment Lower Extremity Assessment: Defer to PT evaluation   Cervical / Trunk Assessment  Cervical / Trunk Assessment: Other exceptions Cervical / Trunk Exceptions: Obesity   Communication Communication Communication: Difficulty communicating thoughts/reduced clarity of speech;Difficulty following commands/understanding Following commands: Follows one step commands inconsistently Cueing Techniques: Verbal cues;Gestural cues;Tactile cues;Visual  cues   Cognition Arousal: Stuporous Behavior During Therapy: Restless, Flat affect, Impulsive Overall Cognitive Status: Impaired/Different from baseline Area of Impairment: Orientation, Attention, Memory, Following commands, Safety/judgement, Awareness, Problem solving                 Orientation Level: Disoriented to, Place, Time, Situation (Unable to give last name or DOB. 1st name only.) Current Attention Level: Focused Memory: Decreased recall of precautions, Decreased short-term memory Following Commands: Follows one step commands inconsistently Safety/Judgement: Decreased awareness of safety, Decreased awareness of deficits Awareness: Intellectual Problem Solving: Slow processing, Decreased initiation, Difficulty sequencing, Requires verbal cues, Requires tactile cues       General Comments       Exercises     Shoulder Instructions      Home Living Family/patient expects to be discharged to:: Skilled nursing facility                                 Additional Comments: Pt unable to give any information. Pt gave first name only. Used wrist band for identity.  Per chart, family is asking for SNF.      Prior Functioning/Environment Prior Level of Function : Patient poor historian/Family not available                        OT Problem List: Decreased cognition;Pain;Impaired UE functional use;Decreased safety awareness;Impaired balance (sitting and/or standing);Decreased range of motion;Decreased coordination;Obesity;Increased edema;Decreased knowledge of use of DME or AE;Impaired vision/perception;Decreased strength      OT Treatment/Interventions: Self-care/ADL training;Balance training;Therapeutic exercise;Therapeutic activities;Cognitive remediation/compensation;Visual/perceptual remediation/compensation;DME and/or AE instruction    OT Goals(Current goals can be found in the care plan section) Acute Rehab OT Goals OT Goal Formulation:  Patient unable to participate in goal setting Time For Goal Achievement: 04/17/23 Potential to Achieve Goals: Fair ADL Goals Pt Will Perform Eating: with adaptive utensils;with min assist;sitting Pt Will Perform Grooming: sitting;with set-up;with supervision;with contact guard assist (EOB with good balance) Pt Will Transfer to Toilet: stand pivot transfer;with min assist;bedside commode Pt Will Perform Toileting - Clothing Manipulation and hygiene: with min assist;sitting/lateral leans;sit to/from stand;with adaptive equipment;with caregiver independent in assisting Pt/caregiver will Perform Home Exercise Program: With minimal assist;With Supervision;Increased ROM;Increased strength;Both right and left upper extremity Additional ADL Goal #1: Pt will demosntrate improved cognition and be able to state her full name, DOB and identify 3/4 common ADL objects verbally in order to better follow caregiver instructions. .  OT Frequency: Min 1X/week    Co-evaluation              AM-PAC OT "6 Clicks" Daily Activity     Outcome Measure Help from another person eating meals?: Total Help from another person taking care of personal grooming?: A Little Help from another person toileting, which includes using toliet, bedpan, or urinal?: Total Help from another person bathing (including washing, rinsing, drying)?: Total Help from another person to put on and taking off regular upper body clothing?: Total Help from another person to put on and taking off regular lower body clothing?: Total 6 Click Score: 8   End of Session Equipment Utilized During Treatment: Gait belt;Rolling walker (2 wheels) Nurse Communication: Patient requests pain  meds  Activity Tolerance: Patient limited by pain;Other (comment) (Limited by delirium) Patient left: in bed;with call bell/phone within reach;with bed alarm set;with SCD's reapplied  OT Visit Diagnosis: Unsteadiness on feet (R26.81);Pain Pain - Right/Left:  Right Pain - part of body:  (flank per chart. Pt unable to verbalize)                Time: 1416-1440 OT Time Calculation (min): 24 min Charges:  OT General Charges $OT Visit: 1 Visit OT Evaluation $OT Eval Low Complexity: 1 Low OT Treatments $Therapeutic Activity: 8-22 mins  Victorino Dike, OT Acute Rehab Services Office: [phone] 04/03/2023   Theodoro Clock 04/03/2023, 3:13 PM

## 2023-04-03 NOTE — Progress Notes (Signed)
   04/03/23 1951  Vitals  Temp (!) 101.2 F (38.4 C)  Temp Source Oral  BP (!) 146/78  MAP (mmHg) 94  BP Location Left Arm  BP Method Automatic  Patient Position (if appropriate) Lying  Pulse Rate (!) 123  Pulse Rate Source Monitor  Resp (!) 24  MEWS COLOR  MEWS Score Color Red  Oxygen Therapy  SpO2 98 %  O2 Device Room Air  MEWS Score  MEWS Temp 1  MEWS Systolic 0  MEWS Pulse 2  MEWS RR 1  MEWS LOC 0  MEWS Score 4

## 2023-04-03 NOTE — Progress Notes (Signed)
PT Cancellation Note  Patient Details Name: Helen Richardson MRN: [id number] DOB: 1933/03/14   Cancelled Treatment:    Reason Eval/Treat Not Completed: Fatigue/lethargy limiting ability to participate Patient recently mobilized with OT. Will check back another time.  Blanchard Kelch PT Acute Rehabilitation Services Office [phone] Weekend pager-[phone]   Rada Hay 04/03/2023, 2:45 PM

## 2023-04-03 NOTE — Progress Notes (Signed)
PROGRESS NOTE  Helen Richardson:[id number] DOB: 1933/08/18 DOA: 03/28/2023 PCP: System, Provider Not In  Brief History   The patient is a 89 yr old woman who presented to [facility] ED on 03/28/2023 with complaints of right sided flank pain. The patient had had a previous admission in November 2024 for AMS after being found unresponsive. She was found to be hypoglycemic on that occasion. She gradually improved, and was discharged to outpatient follow-up. The patient states that she developed right sided flank pain and high glucoses in the past few days. Upon presentation her glucose was 629. [name] Kitchen She also had a WBC of 18, Hgb 8.8. and sodium 125. CT abdomen and pelvis which demonstrated a circumferential mass in the distal ascending colon near the hepatic flexure measuring 7 cm. There is also a enhancing complex collection in the right perirenal space measuring 6 cm with concern for tumor or metastatic disease. Interventional radiology was consulted in the emergency department for possible drain placement versus biopsy. Dr. Deanne Coffer was contacted. She was placed on insulin and started on antibiotics.   The patient has been evaluated by IR. As the patient herself is not capable of expressing herself, following commands, or making her own decisions. Her son was at bedside. The procedure's benefits and risks were  discussed with the family. Ultimately no drain was placed as the family did not give consent. Palliative care has been consulted.   The patient had a drain placed by IR on 03/31/2023. Her WBC briefly increased, but then proceeded to decline. On 04/02/2023 Palliative care had a family meeting. In this meeting the family stated that they would not be pursuing a biopsy at this point. The family wishes for the patient to work with PT and perhaps go on to rehab. They might want to revisit biopsy in the future. ID has recommended that the patient is not appropriate for long term antibiotics.. They feel that she is  better suited for oral antibiotics. The family wishes to continue the patient's CODE STATUS as full code until they discuss the matter more fully as a family.   A & P  Intraabdominal fluid collection with colon mass: The patient has had a drain placed by IR today. She has tolerated the procedure well. Aspirated fluid has been sent for gram stain and culture. ID has been consulted. Palliative care has discussed the patient with the family and initiated discussions about what their wishes will be regarding the patient's colon mass. Her diet has now been restarted. WBC had declined from 19.1 to 15.8, but has increased again today to 23.5. This increase may be due to the drain placement yesterday.  She is receiving IV ampicillin. Blood cultures x 2 have been obtained. Gram stain is positive for PMN's and GPC. No speciation as of yet. The drain output is declining.   Flank pain likely related to malignancy Patient presented with abdominal pain found to have a large colon mass with adjacent fluid collection Likely related to metastatic disease or locally advanced colon cancer. The patient has had no prior colonoscopy. Palliative care has been consulted. Family meeting has been arranged for 2:00 pm on 04/02/2023. Oncology consult will be obtained once tissue diagnosis is made. WBC has declined from 19.1 to 15.8. The patient is receiving oxycodone for pain. She is using it about twice a day.  Hyperglycemia in setting of poorly controlled type 2 diabetes Patient showed ketosis and hyperglycemia upon admission. However, no acidosis so concern for DKA is low.  Blood sugars greater than 600 on presentation, but 63 later the day of admission. Pt on Tresiba 6 units daily prior to presentation. On admission the patient was placed on 15 units of Semglee daily with SSI. Blood sugars 115 - 153 in the last 24 hours. HbA1c is 11.7.  Dementia Moderately advanced. The patient is not situationally aware. She does not  comprehend her medical situation and is unable to provide consent for procedures.   Bacteremia 1/2 bottles positive for e fecaelis. The patient is receiving IV ampicillin. Susceptibilities are pending. Will repeat cultures. WBC has declined from 19.1 to 15.8. Continue unasyn for now.   HTN The patient is normotensive without antihypertensives. Monitor.  GERD Continue Protonix  I have seen and examined this patient myself. I have spent 32 minutes in her evaluation and care.  DVT prophylaxis: Lovenox Code Status: Full Code Family Communication: None available Disposition Plan: tbd    Lorenza Winkleman, DO [facility] Direct contact: see [email or web address]  7PM-7AM contact night coverage as above 04/03/2023, 4:38 PM  LOS: 5 days   Consultants  IR General Surgery Palliative care  Procedures  None  Antibiotics   Anti-infectives (From admission, onward)    Start     Dose/Rate Route Frequency Ordered Stop   03/31/23 1600  Ampicillin-Sulbactam (UNASYN) 3 g in sodium chloride 0.9 % 100 mL IVPB        3 g 200 mL/hr over 30 Minutes Intravenous Every 6 hours 03/31/23 1451     03/30/23 2300  ampicillin (OMNIPEN) 2 g in sodium chloride 0.9 % 100 mL IVPB  Status:  Discontinued        2 g 300 mL/hr over 20 Minutes Intravenous Every 6 hours 03/30/23 2204 03/31/23 1451   03/29/23 1000  ceFEPIme (MAXIPIME) 2 g in sodium chloride 0.9 % 100 mL IVPB  Status:  Discontinued        2 g 200 mL/hr over 30 Minutes Intravenous Every 12 hours 03/28/23 2348 03/29/23 0040   03/29/23 0000  vancomycin (VANCOCIN) IVPB 1000 mg/200 mL premix  Status:  Discontinued        1,000 mg 200 mL/hr over 60 Minutes Intravenous  Once 03/28/23 2348 03/29/23 0040   03/28/23 2215  cefTRIAXone (ROCEPHIN) 2 g in sodium chloride 0.9 % 100 mL IVPB        2 g 200 mL/hr over 30 Minutes Intravenous  Once 03/28/23 2203 03/29/23 0002        Interval History/Subjective  The patient is sitting up in bed eating lunch without  difficulty.  Objective   Vitals:  Vitals:   04/03/23 0524 04/03/23 1359  BP: 118/60 (!) 153/102  Pulse: 98 (!) 56  Resp: 15 18  Temp: 98 F (36.7 C) 98.8 F (37.1 C)  SpO2: 95% 100%    Exam:  Constitutional:  The patient is somnolent. No acute distress. Respiratory:  No increased work of breathing. No wheezes, rales, or rhonchi No tactile fremitus Cardiovascular:  Regular rate and rhythm No murmurs, ectopy, or gallups. No lateral PMI. No thrills. Abdomen:  Abdomen is soft, non-distended. Positive for mild diffuse tenderness No hernias, masses, or organomegaly Normoactive bowel sounds.  Musculoskeletal:  No cyanosis, clubbing, or edema Skin:  No rashes, lesions, ulcers palpation of skin: no induration or nodules Neurologic:  CN 2-12 intact Sensation all 4 extremities intact Psychiatric:  Mental status Mood, affect appropriate Pleasantly confused   Scheduled Meds:  enoxaparin (LOVENOX) injection  40 mg Subcutaneous Q24H   insulin aspart  0-9 Units Subcutaneous TID WC   insulin glargine-yfgn  15 Units Subcutaneous Daily   pantoprazole  40 mg Oral BID   sodium chloride flush  5 mL Intracatheter Q8H   Continuous Infusions:  ampicillin-sulbactam (UNASYN) IV 3 g (04/03/23 1542)   dextrose 10 % and 0.45 % NaCl 1,000 mL with potassium chloride 40 mEq infusion 50 mL/hr at 04/03/23 1541    Principal Problem:   Intraabdominal fluid collection Active Problems:   Palliative care encounter   Colonic mass   Goals of care, counseling/discussion   Counseling and coordination of care   Need for emotional support   Bacteremia   DNR (do not resuscitate) discussion   LOS: 5 days

## 2023-04-03 NOTE — Progress Notes (Signed)
MEWS Progress Note  Patient Details Name: SHALIKA ARNTZ MRN: [id number] DOB: 09/02/1933 Today's Date: 04/03/2023   MEWS Flowsheet Documentation:  Assess: MEWS Score Temp: 97.9 F (36.6 C) BP: (!) 149/80 MAP (mmHg): 100 Pulse Rate: (!) 110 ECG Heart Rate: 94 Resp: 20 Level of Consciousness: Alert SpO2: 98 % O2 Device: Room Air Patient Activity (if Appropriate): In bed O2 Flow Rate (L/min): 2 L/min Assess: MEWS Score MEWS Temp: 0 MEWS Systolic: 0 MEWS Pulse: 1 MEWS RR: 0 MEWS LOC: 0 MEWS Score: 1 MEWS Score Color: Green Assess: SIRS CRITERIA SIRS Temperature : 0 SIRS Respirations : 0 SIRS Pulse: 1 SIRS WBC: 0 SIRS Score Sum : 1 Assess: if the MEWS score is Yellow or Red Were vital signs accurate and taken at a resting state?: Yes Does the patient meet 2 or more of the SIRS criteria?: No MEWS guidelines implemented : Yes, red Treat MEWS Interventions: Considered administering scheduled or prn medications/treatments as ordered Take Vital Signs Increase Vital Sign Frequency : Red: Q1hr x2, continue Q4hrs until patient remains green for 12hrs Escalate MEWS: Escalate: Red: Discuss with charge nurse and notify provider. Consider notifying RRT. If remains red for 2 hours consider need for higher level of care        Johnny Bridge 04/03/2023, 11:43 PM

## 2023-04-03 NOTE — Progress Notes (Signed)
Hypoglycemic Event  CBG: 69   Treatment: 4 oz juice/soda  Symptoms: None  Follow-up CBG: Time:0829 CBG Result:96  Possible Reasons for Event: Inadequate meal intake  Comments/MD notified: Dr. Gerri Lins notified.  New orders to increase fluid rate written and carried out.      Jodi Geralds D

## 2023-04-03 NOTE — Progress Notes (Signed)
Referring Provider(s): Dr. Alan Mulder    Supervising Physician: Simonne Come  Patient Status:  [facility] - In-pt  Chief Complaint: Abdominal pain s/p drain placement seen for drain follow up   Brief History:  89 y.o. female with history of CHF, Diabetes mellitus, high cholesterol, HTN, and obesity. Patient presented to the ER via EMS 03/28/23 with hyperglycemia and c/o right flank pain. CT abdomen/pelvis 03/28/23 reported:    1. Circumferential heterogeneous mass in the distal ascending colon near the hepatic flexure measuring up to 7.1 cm. Findings are worrisome for colon neoplasm. 2. Peripherally enhancing complex collection in the right perirenal space lateral to the kidney measuring up to 6.1 cm. This abuts the colonic mass. Findings may represent direct extension of tumor/necrotic tumor, metastatic disease or abscess.   RUQ drain placed in IR on 03/31/23 by Dr. Mosie Epstein.  Subjective:  Patient laying on her left side, moaning occasionally. Appears uncomfortable.  Allergies: Tradjenta [linagliptin]  Medications: Prior to Admission medications   Medication Sig Start Date End Date Taking? Authorizing Provider  ASPIRIN EC ADULT LOW DOSE 81 MG tablet Take 81 mg by mouth daily. 02/09/23  Yes [provider]  CALCIUM + VITAMIN D3 600-5 MG-MCG TABS Take 1 tablet by mouth daily.   Yes [provider]  carvedilol (COREG) 6.25 MG tablet Take 6.25 mg by mouth 2 (two) times daily with a meal.   Yes [provider]  FEROSUL 325 (65 Fe) MG tablet Take 325 mg by mouth 2 (two) times daily.   Yes [provider]  furosemide (LASIX) 40 MG tablet Take 40 mg by mouth daily.   Yes [provider]  potassium chloride SA (KLOR-CON M) 20 MEQ tablet Take 20 mEq by mouth every morning. 03/09/23  Yes [provider]  telmisartan (MICARDIS) 80 MG tablet Take 80 mg by mouth every morning. 03/09/23  Yes [provider]  TRESIBA FLEXTOUCH 100  UNIT/ML FlexTouch Pen Inject 6 Units into the skin in the morning. 02/05/23  Yes [provider]  metFORMIN (GLUCOPHAGE-XR) 500 MG 24 hr tablet Take 1 tablet (500 mg total) by mouth daily with breakfast. Patient not taking: Reported on 03/29/2023 01/21/23   Burnadette Pop, MD  pantoprazole (PROTONIX) 40 MG tablet Take 1 tablet (40 mg total) by mouth 2 (two) times daily. Patient not taking: Reported on 03/29/2023 01/20/23   Burnadette Pop, MD  polyethylene glycol (MIRALAX / GLYCOLAX) 17 g packet Take 17 g by mouth daily. Patient not taking: Reported on 03/29/2023 01/21/23   Burnadette Pop, MD     Vital Signs: BP (!) 153/102 (BP Location: Left Arm)   Pulse (!) 56   Temp 98.8 F (37.1 C) (Oral)   Resp 18   Wt 167 lb 12.3 oz (76.1 kg)   SpO2 100%   BMI 27.08 kg/m   Physical Exam Vitals reviewed.  Constitutional:      Appearance: Normal appearance.  HENT:     Head: Normocephalic and atraumatic.     Mouth/Throat:     Mouth: Mucous membranes are moist.     Pharynx: Oropharynx is clear.  Cardiovascular:     Rate and Rhythm: Normal rate and regular rhythm.  Pulmonary:     Effort: Pulmonary effort is normal.  Abdominal:     Tenderness: There is no right CVA tenderness.     Comments: RUQ/flank drain held in place with sutures and stat lock. Overlying bandage is clean and dry. Bulb to suction with ~5cc of  straw colored fluid in bulb. Flushes and aspirates easily without resistance or reflux; however patient acutely uncomfortable when this is done.   Musculoskeletal:     Cervical back: Normal range of motion.  Skin:    General: Skin is warm and dry.  Neurological:     Mental Status: She is alert. Mental status is at baseline.  Psychiatric:        Mood and Affect: Mood normal.        Behavior: Behavior normal.    Drain Location: RUQ Size: Fr size: 10 Fr Date of placement: 03/31/23  Currently to: Drain collection device: suction bulb 24 hour output:  Output by Drain (mL)  04/01/23 0701 - 04/01/23 1900 04/01/23 1901 - 04/02/23 0700 04/02/23 0701 - 04/02/23 1900 04/02/23 1901 - 04/03/23 0700 04/03/23 0701 - 04/03/23 1406  Closed System Drain 1 Lateral RUQ Bulb (JP) 10 Fr. 30 0 25 10 0    Interval imaging/drain manipulation:  None  Current examination: Flushes/aspirates easily; however patient has obvious increased discomfort when this is done, particularly with aspiration. Aspirate is minimally straw colored and non-bloody. Insertion site unremarkable. Suture and stat lock in place. Dressed appropriately.   Labs:  CBC: Recent Labs    03/30/23 0424 03/31/23 0518 04/01/23 0508 04/02/23 0455  WBC 19.1* 15.8* 23.5* 17.5*  HGB 8.5* 8.2* 9.0* 7.9*  HCT 27.9* 26.4* 29.6* 25.4*  PLT 393 371 373 386    COAGS: Recent Labs    03/29/23 0850  INR 1.2    BMP: Recent Labs    03/30/23 0424 03/31/23 0518 04/01/23 0508 04/02/23 0455  NA 134* 134* 134* 136  K 4.4 4.0 4.6 3.6  CL 100 100 102 103  CO2 25 25 22 24   GLUCOSE 137* 154* 223* 63*  BUN 14 15 12 10   CALCIUM 8.6* 8.2* 8.0* 8.0*  CREATININE 0.90 0.75 1.13* 1.00  GFRNONAA >60 >60 47* 54*    LIVER FUNCTION TESTS: Recent Labs    11/19/22 1255 03/28/23 1641 03/29/23 0401  BILITOT 0.4 1.0 0.7  AST 11* 14* 15  ALT 6 12 11   ALKPHOS 51 71 63  PROT 6.6 7.2 6.6  ALBUMIN 3.3* 2.8* 2.4*    Assessment and Plan:  89 y.o. female with history of CHF, Diabetes mellitus, high cholesterol, HTN, and obesity. Patient presented to the ER via EMS 03/28/23 with hyperglycemia and c/o right flank pain. CT A/P 03/28/23 with concerns of mass in the distal ascending colon and complex collection in the right perirenal space concerning for tumor vs abscess. RUQ drain placed in IR on 03/31/23 by Dr. Mosie Epstein.  -Patient with increased pain and discomfort to right flank today -Complains of pain with flushing and aspiration of RUQ drain -Will plan for f/u CT A/P IV contrast only to reassess  Continue TID flushes  with 5 cc NS. Record output Q shift. Dressing changes QD or PRN if soiled.  Call IR APP or on call IR MD if difficulty flushing or sudden change in drain output.  Repeat imaging/possible drain injection once output < 10 mL/QD (excluding flush material). Consideration for drain removal if output is < 10 mL/QD (excluding flush material), pending discussion with the providing surgical service.  Discharge planning: Please contact IR APP or on call IR MD prior to patient d/c to ensure appropriate follow up plans are in place. Typically patient will follow up with IR clinic 10-14 days post d/c for repeat imaging/possible drain injection. IR scheduler will contact patient with date/time  of appointment. Patient will need to flush drain QD with 5 cc NS, record output QD, dressing changes every 2-3 days or earlier if soiled.   IR will continue to follow - please call with questions or concerns.  Electronically Signed: Jama Flavors, PA-C 04/03/2023, 1:59 PM    I spent a total of 15 Minutes at the the patient's bedside AND on the patient's [facility] floor or unit, greater than 50% of which was counseling/coordinating care for drain follow up.

## 2023-04-03 NOTE — Progress Notes (Addendum)
 Subjective: Complaining of pain in her flank   Antibiotics:  Anti-infectives (From admission, onward)    Start     Dose/Rate Route Frequency Ordered Stop   03/31/23 1600  Ampicillin-Sulbactam (UNASYN) 3 g in sodium chloride 0.9 % 100 mL IVPB        3 g 200 mL/hr over 30 Minutes Intravenous Every 6 hours 03/31/23 1451     03/30/23 2300  ampicillin (OMNIPEN) 2 g in sodium chloride 0.9 % 100 mL IVPB  Status:  Discontinued        2 g 300 mL/hr over 20 Minutes Intravenous Every 6 hours 03/30/23 2204 03/31/23 1451   03/29/23 1000  ceFEPIme (MAXIPIME) 2 g in sodium chloride 0.9 % 100 mL IVPB  Status:  Discontinued        2 g 200 mL/hr over 30 Minutes Intravenous Every 12 hours 03/28/23 2348 03/29/23 0040   03/29/23 0000  vancomycin (VANCOCIN) IVPB 1000 mg/200 mL premix  Status:  Discontinued        1,000 mg 200 mL/hr over 60 Minutes Intravenous  Once 03/28/23 2348 03/29/23 0040   03/28/23 2215  cefTRIAXone (ROCEPHIN) 2 g in sodium chloride 0.9 % 100 mL IVPB        2 g 200 mL/hr over 30 Minutes Intravenous  Once 03/28/23 2203 03/29/23 0002       Medications: Scheduled Meds:  enoxaparin (LOVENOX) injection  40 mg Subcutaneous Q24H   insulin aspart  0-9 Units Subcutaneous TID WC   insulin glargine-yfgn  15 Units Subcutaneous Daily   pantoprazole  40 mg Oral BID   sodium chloride flush  5 mL Intracatheter Q8H   Continuous Infusions:  ampicillin-sulbactam (UNASYN) IV 3 g (04/03/23 0952)   dextrose 10 % and 0.45 % NaCl 1,000 mL with potassium chloride 40 mEq infusion 50 mL/hr at 04/03/23 0954   PRN Meds:.acetaminophen **OR** acetaminophen, fentaNYL, fentaNYL, lidocaine, midazolam, ondansetron **OR** ondansetron (ZOFRAN) IV, oxyCODONE    Objective: Weight change:   Intake/Output Summary (Last 24 hours) at 04/03/2023 1053 Last data filed at 04/03/2023 1000 Gross per 24 hour  Intake 572.14 ml  Output 275 ml  Net 297.14 ml   Blood pressure 118/60, pulse 98, temperature  98 F (36.7 C), temperature source Oral, resp. rate 15, weight 76.1 kg, SpO2 95%. Temp:  [98 F (36.7 C)-98.4 F (36.9 C)] 98 F (36.7 C) (01/31 0524) Pulse Rate:  [98-104] 98 (01/31 0524) Resp:  [15-18] 15 (01/31 0524) BP: (118-158)/(60-73) 118/60 (01/31 0524) SpO2:  [95 %-100 %] 95 % (01/31 0524)  Physical Exam: Physical Exam Vitals reviewed.  Pulmonary:     Effort: No respiratory distress.  Abdominal:     General: There is no distension.  Skin:    General: Skin is warm and dry.  Neurological:     General: No focal deficit present.     Mental Status: She is alert.  Psychiatric:        Mood and Affect: Mood is anxious.        Cognition and Memory: Cognition is impaired. Memory is impaired. She exhibits impaired recent memory and impaired remote memory.     Drain in place  CBC:    BMET Recent Labs    04/01/23 0508 04/02/23 0455  NA 134* 136  K 4.6 3.6  CL 102 103  CO2 22 24  GLUCOSE 223* 63*  BUN 12 10  CREATININE 1.13* 1.00  CALCIUM 8.0* 8.0*  Liver Panel  No results for input(s): "PROT", "ALBUMIN", "AST", "ALT", "ALKPHOS", "BILITOT", "BILIDIR", "IBILI" in the last 72 hours.     Sedimentation Rate No results for input(s): "ESRSEDRATE" in the last 72 hours. C-Reactive Protein No results for input(s): "CRP" in the last 72 hours.  Micro Results: Recent Results (from the past 720 hours)  Blood culture (routine x 2)     Status: None   Collection Time: 03/28/23 11:42 PM   Specimen: BLOOD  Result Value Ref Range Status   Specimen Description   Final    BLOOD RIGHT ANTECUBITAL Performed at [facility], [address], [place], [place] [address]    Special Requests   Final    Blood Culture adequate volume BOTTLES DRAWN AEROBIC AND ANAEROBIC Performed at [facility], [address], [place], [place] [address]    Culture   Final    NO GROWTH 5 DAYS Performed at [facility], [address]  [place], [place] [address]    Report Status 04/03/2023 FINAL  Final  Blood culture (routine x 2)     Status: Abnormal   Collection Time: 03/28/23 11:47 PM   Specimen: BLOOD  Result Value Ref Range Status   Specimen Description   Final    BLOOD LEFT ANTECUBITAL Performed at [facility], [address] [place], [place] [address]    Special Requests   Final    Blood Culture results may not be optimal due to an inadequate volume of blood received in culture bottles BOTTLES DRAWN AEROBIC AND ANAEROBIC Performed at [facility], [address], [place], [place] [address]    Culture  Setup Time   Final    GRAM POSITIVE COCCI IN CHAINS ANAEROBIC BOTTLE ONLY CRITICAL RESULT CALLED TO, READ BACK BY AND VERIFIED WITH: PHARMD L POINTDEXTER 03/30/2023 @ 2154 BY AB Performed at [facility], [address] [place], [place] [address]    Culture ENTEROCOCCUS FAECALIS (A)  Final   Report Status 04/02/2023 FINAL  Final   Organism ID, Bacteria ENTEROCOCCUS FAECALIS  Final      Susceptibility   Enterococcus faecalis - MIC*    AMPICILLIN <=2 SENSITIVE Sensitive     VANCOMYCIN 1 SENSITIVE Sensitive     GENTAMICIN SYNERGY SENSITIVE Sensitive     * ENTEROCOCCUS FAECALIS  Blood Culture ID Panel (Reflexed)     Status: Abnormal   Collection Time: 03/28/23 11:47 PM  Result Value Ref Range Status   Enterococcus faecalis DETECTED (A) NOT DETECTED Final    Comment: CRITICAL RESULT CALLED TO, READ BACK BY AND VERIFIED WITH: PHARMD L POINTDEXTER 03/30/2023 @ 2154 BY AB    Enterococcus Faecium NOT DETECTED NOT DETECTED Final   Listeria monocytogenes NOT DETECTED NOT DETECTED Final   Staphylococcus species NOT DETECTED NOT DETECTED Final   Staphylococcus aureus (BCID) NOT DETECTED NOT DETECTED Final   Staphylococcus epidermidis NOT DETECTED NOT DETECTED Final   Staphylococcus lugdunensis NOT DETECTED NOT DETECTED Final   Streptococcus species NOT DETECTED NOT DETECTED  Final   Streptococcus agalactiae NOT DETECTED NOT DETECTED Final   Streptococcus pneumoniae NOT DETECTED NOT DETECTED Final   Streptococcus pyogenes NOT DETECTED NOT DETECTED Final   A.calcoaceticus-baumannii NOT DETECTED NOT DETECTED Final   Bacteroides fragilis NOT DETECTED NOT DETECTED Final   Enterobacterales NOT DETECTED NOT DETECTED Final   Enterobacter cloacae complex NOT DETECTED NOT DETECTED Final   Escherichia coli NOT DETECTED NOT DETECTED Final  Klebsiella aerogenes NOT DETECTED NOT DETECTED Final   Klebsiella oxytoca NOT DETECTED NOT DETECTED Final   Klebsiella pneumoniae NOT DETECTED NOT DETECTED Final   Proteus species NOT DETECTED NOT DETECTED Final   Salmonella species NOT DETECTED NOT DETECTED Final   Serratia marcescens NOT DETECTED NOT DETECTED Final   Haemophilus influenzae NOT DETECTED NOT DETECTED Final   Neisseria meningitidis NOT DETECTED NOT DETECTED Final   Pseudomonas aeruginosa NOT DETECTED NOT DETECTED Final   Stenotrophomonas maltophilia NOT DETECTED NOT DETECTED Final   Candida albicans NOT DETECTED NOT DETECTED Final   Candida auris NOT DETECTED NOT DETECTED Final   Candida glabrata NOT DETECTED NOT DETECTED Final   Candida krusei NOT DETECTED NOT DETECTED Final   Candida parapsilosis NOT DETECTED NOT DETECTED Final   Candida tropicalis NOT DETECTED NOT DETECTED Final   Cryptococcus neoformans/gattii NOT DETECTED NOT DETECTED Final   Vancomycin resistance NOT DETECTED NOT DETECTED Final    Comment: Performed at [facility], [address], [place], [place] [address]  Culture, blood (Routine X 2) w Reflex to ID Panel     Status: None (Preliminary result)   Collection Time: 03/31/23  9:37 AM   Specimen: BLOOD  Result Value Ref Range Status   Specimen Description   Final    BLOOD BLOOD LEFT HAND Performed at [facility], [address], [place], [place] [address]    Special Requests   Final    BOTTLES DRAWN AEROBIC ONLY  Blood Culture results may not be optimal due to an inadequate volume of blood received in culture bottles Performed at [facility], [address], [place], [place] [address]    Culture   Final    NO GROWTH 3 DAYS Performed at [facility], [address], [place], [place] [address]    Report Status PENDING  Incomplete  Culture, blood (Routine X 2) w Reflex to ID Panel     Status: None (Preliminary result)   Collection Time: 03/31/23  9:42 AM   Specimen: BLOOD  Result Value Ref Range Status   Specimen Description   Final    BLOOD BLOOD RIGHT HAND Performed at [facility], [address], [place], [place] [address]    Special Requests   Final    BOTTLES DRAWN AEROBIC ONLY Blood Culture results may not be optimal due to an inadequate volume of blood received in culture bottles Performed at [facility], [address], [place], [place] [address]    Culture   Final    NO GROWTH 3 DAYS Performed at [facility], [address] [place], [place] [address]    Report Status PENDING  Incomplete  Aerobic/Anaerobic Culture w Gram Stain (surgical/deep wound)     Status: None (Preliminary result)   Collection Time: 03/31/23  4:42 PM   Specimen: Abdomen; Abscess  Result Value Ref Range Status   Specimen Description   Final    ABDOMEN Performed at [facility], [address] [place], [place] [address]    Special Requests   Final    NONE Performed at [facility], [address], [place], [place] [address]    Gram Stain   Final    ABUNDANT WBC PRESENT,BOTH PMN AND MONONUCLEAR ABUNDANT GRAM POSITIVE COCCI    Culture   Final    ABUNDANT ESCHERICHIA COLI ABUNDANT STREPTOCOCCUS ANGINOSIS ABUNDANT BACTEROIDES THETAIOTAOMICRON BETA LACTAMASE POSITIVE SUSCEPTIBILITIES TO FOLLOW Performed at  [facility], [address] [place], [place] [address]    Report Status PENDING   Incomplete   Organism ID, Bacteria ESCHERICHIA COLI  Final      Susceptibility   Escherichia coli - MIC*    AMPICILLIN <=2 SENSITIVE Sensitive     CEFEPIME <=0.12 SENSITIVE Sensitive     CEFTAZIDIME <=1 SENSITIVE Sensitive     CEFTRIAXONE <=0.25 SENSITIVE Sensitive     CIPROFLOXACIN <=0.25 SENSITIVE Sensitive     GENTAMICIN <=1 SENSITIVE Sensitive     IMIPENEM <=0.25 SENSITIVE Sensitive     TRIMETH/SULFA <=20 SENSITIVE Sensitive     AMPICILLIN/SULBACTAM <=2 SENSITIVE Sensitive     PIP/TAZO <=4 SENSITIVE Sensitive ug/mL    * ABUNDANT ESCHERICHIA COLI  Culture, blood (Routine X 2) w Reflex to ID Panel     Status: None (Preliminary result)   Collection Time: 03/31/23  5:22 PM   Specimen: BLOOD  Result Value Ref Range Status   Specimen Description   Final    BLOOD BLOOD RIGHT HAND Performed at [facility], [address], [place], [place] [address]    Special Requests   Final    BOTTLES DRAWN AEROBIC ONLY Blood Culture results may not be optimal due to an inadequate volume of blood received in culture bottles Performed at [facility], [address], [place], [place] [address]    Culture   Final    NO GROWTH 3 DAYS Performed at [facility], [address] [place], [place] [address]    Report Status PENDING  Incomplete  Culture, blood (Routine X 2) w Reflex to ID Panel     Status: None (Preliminary result)   Collection Time: 03/31/23  5:34 PM   Specimen: BLOOD  Result Value Ref Range Status   Specimen Description   Final    BLOOD BLOOD LEFT HAND Performed at [facility], [address], [place], [place] [address]    Special Requests   Final    BOTTLES DRAWN AEROBIC ONLY Blood Culture results may not be optimal due to an inadequate volume of blood received in culture bottles Performed at [facility], [address] [place], [place] [address]    Culture   Final    NO GROWTH 3 DAYS Performed  at [facility], [address] [place], [place] [address]    Report Status PENDING  Incomplete    Studies/Results: No results found.     Assessment/Plan:  INTERVAL HISTORY: Abscess cultures have grown E. coli as well as Streptococcus anginosus and a beta-lactamase positive Bacteroides species    Principal Problem:   Intraabdominal fluid collection Active Problems:   Palliative care encounter   Colonic mass   Goals of care, counseling/discussion   Counseling and coordination of care   Need for emotional support   Bacteremia   DNR (do not resuscitate) discussion    SEILA LISTON is a 89 y.o. female with advanced dementia admitted the hospital in November with what was thought to be metabolic encephalopathy now again admitted with confusion  and abdominal pain found to have a E faecalis bacteremia as well as what appears to be a colon cancer associate with intra-abdominal abscess status post IR guided drainage.   #1 E faecalis bacteremia likely from intra-abdominal source with abscess:  Continue unasyn for now  Repeat blood cultures have been taken  Echocardiogram was suboptimal but did not show any evidence of endocarditis  I do not think she would be an appropriate candidate for long-term IV antibiotics and would instead prefer oral antibiotics for her.  #2 Intrabdominal abscess: Presumed to be the source of her E faecalis bacteremia this abscess is polymicrobial with E. coli and Streptococcus anginosus and beta-lactamase producing Bacteroides on culture.  Continue Unasyn but follow-up susceptibility data on her Streptococcus anginosus.  If her strep species is sensitive I think that when she is ready to DC can complete a course of augmentin x 4 weeks post aspiration of abscess  NOTE the abscess is going to RECUR given that she appears to have a colon cancer causing this likley via a perforation  #3  Colon cancer likely present: This patient would not be a  candidate for chemotherapy in my view.    4 Dementia: she likely has some super imposed delirium that seems better today  #5 Goals of care: I really feel that the patient currently should have emphasis on pain control and quality of life as she is nearing the end of hers. I do not think aggressive care is going to help her suffering and in fact may delay her receiving more appropriate palliation   I have personally spent 50  minutes involved in face-to-face and non-face-to-face activities for this patient on the day of the visit. Professional time spent includes the following activities: Preparing to see the patient (review of tests), Obtaining and/or reviewing separately obtained history (admission/discharge record), Performing a medically appropriate examination and/or evaluation , Ordering medications/tests/procedures, referring and communicating with other health care professionals, Documenting clinical information in the EMR, Independently interpreting results (not separately reported), Communicating results to the patient/family/caregiver, Counseling and educating the patient/family/caregiver and Care coordination (not separately reported).   Evaluation of the patient requires complex antimicrobial therapy evaluation, counseling , isolation needs to reduce disease transmission and risk assessment and mitigation.   I will followup on culture data as will my partner Dr. Elinor Parkinson over the weekend.  If the strep anginosus can be treated with augmentin we will proceed with plan as above and I will sign off.     LOS: 5 days   Acey Lav 04/03/2023, 10:53 AM

## 2023-04-04 ENCOUNTER — Inpatient Hospital Stay (HOSPITAL_COMMUNITY): Payer: Medicare PPO

## 2023-04-04 DIAGNOSIS — K6389 Other specified diseases of intestine: Secondary | ICD-10-CM | POA: Diagnosis not present

## 2023-04-04 DIAGNOSIS — R188 Other ascites: Secondary | ICD-10-CM | POA: Diagnosis not present

## 2023-04-04 DIAGNOSIS — Z515 Encounter for palliative care: Secondary | ICD-10-CM | POA: Diagnosis not present

## 2023-04-04 DIAGNOSIS — R4589 Other symptoms and signs involving emotional state: Secondary | ICD-10-CM | POA: Diagnosis not present

## 2023-04-04 LAB — CBC WITH DIFFERENTIAL/PLATELET
Abs Immature Granulocytes: 0.73 10*3/uL — ABNORMAL HIGH (ref 0.00–0.07)
Basophils Absolute: 0.1 10*3/uL (ref 0.0–0.1)
Basophils Relative: 0 %
Eosinophils Absolute: 0.1 10*3/uL (ref 0.0–0.5)
Eosinophils Relative: 0 %
HCT: 23.2 % — ABNORMAL LOW (ref 36.0–46.0)
Hemoglobin: 7.4 g/dL — ABNORMAL LOW (ref 12.0–15.0)
Immature Granulocytes: 3 %
Lymphocytes Relative: 8 %
Lymphs Abs: 2.2 10*3/uL (ref 0.7–4.0)
MCH: 24.1 pg — ABNORMAL LOW (ref 26.0–34.0)
MCHC: 31.9 g/dL (ref 30.0–36.0)
MCV: 75.6 fL — ABNORMAL LOW (ref 80.0–100.0)
Monocytes Absolute: 2.7 10*3/uL — ABNORMAL HIGH (ref 0.1–1.0)
Monocytes Relative: 10 %
Neutro Abs: 21 10*3/uL — ABNORMAL HIGH (ref 1.7–7.7)
Neutrophils Relative %: 79 %
Platelets: 372 10*3/uL (ref 150–400)
RBC: 3.07 MIL/uL — ABNORMAL LOW (ref 3.87–5.11)
RDW: 19 % — ABNORMAL HIGH (ref 11.5–15.5)
WBC: 26.8 10*3/uL — ABNORMAL HIGH (ref 4.0–10.5)
nRBC: 0 % (ref 0.0–0.2)

## 2023-04-04 LAB — BASIC METABOLIC PANEL
Anion gap: 9 (ref 5–15)
BUN: 10 mg/dL (ref 8–23)
CO2: 23 mmol/L (ref 22–32)
Calcium: 7.5 mg/dL — ABNORMAL LOW (ref 8.9–10.3)
Chloride: 102 mmol/L (ref 98–111)
Creatinine, Ser: 1.28 mg/dL — ABNORMAL HIGH (ref 0.44–1.00)
GFR, Estimated: 40 mL/min — ABNORMAL LOW (ref >60)
Glucose, Bld: 148 mg/dL — ABNORMAL HIGH (ref 70–99)
Potassium: 3.8 mmol/L (ref 3.5–5.1)
Sodium: 134 mmol/L — ABNORMAL LOW (ref 135–145)

## 2023-04-04 LAB — GLUCOSE, CAPILLARY
Glucose-Capillary: 158 mg/dL — ABNORMAL HIGH (ref 70–99)
Glucose-Capillary: 167 mg/dL — ABNORMAL HIGH (ref 70–99)
Glucose-Capillary: 194 mg/dL — ABNORMAL HIGH (ref 70–99)
Glucose-Capillary: 216 mg/dL — ABNORMAL HIGH (ref 70–99)

## 2023-04-04 MED ORDER — IOHEXOL 300 MG/ML  SOLN
80.0000 mL | Freq: Once | INTRAMUSCULAR | Status: AC | PRN
  Administered 2023-04-04: 80 mL via INTRAVENOUS

## 2023-04-04 NOTE — Progress Notes (Signed)
Daily Progress Note   Patient Name: Helen Richardson       Date: 04/04/2023 DOB: 1933/10/22  Age: 89 y.o. MRN#: [id number] Attending Physician: Fran Lowes, DO Primary Care Physician: System, Provider Not In Admit Date: 03/28/2023 Length of Stay: 6 days  Reason for Consultation/Follow-up: Establishing goals of care  Subjective:   CC: Patient sitting up in bedside chair.  Following up regarding complex medical decision making.  Subjective:  Reviewed EMR prior to presenting to bedside.  At time of EMR review in past 24 hours, patient has required oxycodone 5 mg x 2 doses for pain management. 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  Presented to bedside to see patient.  Patient seen sitting up in bedside chair.  Patient very pleasant noting that her pain is well-managed.  Patient noted working with physical therapy today and she liked doing this.  Inquired if family had been present today and she noted that her son had been present earlier this morning.  Noted would attempt to reach Helen Richardson.  Spent time providing emotional support via active listening.  Attempted to contact son, Helen Richardson, without answer.  Objective:   Vital Signs:  BP 131/84 (BP Location: Right Arm)   Pulse 98   Temp 98 F (36.7 C) (Oral)   Resp 16   Wt 76.1 kg   SpO2 96%   BMI 27.08 kg/m   Physical Exam: General: NAD, sitting up in bedside chair, pleasant, chronically ill appearing  Cardiovascular: RRR Respiratory: no increased work of breathing noted, not in respiratory distress Skin: Bandage in place over right flank drain  Imaging: I personally reviewed recent imaging.   Assessment & Plan:   Assessment: Patient is an 89 year old female  with a past medical history of dementia, hypertension, GERD, and diabetes who was admitted on 03/28/2023 for management of right-sided flank pain. Since admission, patient had imaging which demonstrated a circumferential mass in the distal ascending colon near the hepatic flexure measuring 7 cm and also on enhancing complex collection in the right perirenal space measuring 6 cm with concern for tumor or metastatic disease. IR consulted for evaluation. Palliative medicine team consulted to assist with complex medical decision making.   Recommendations/Plan: # Complex medical decision making/goals of care:   -Had met with patient's family (2 sons and niece) on Thursday, 04/02/2023, to review possible pathways for medical care moving forward.  Family had elected not to pursue biopsy at this time.  Family wanting to continue with aggressive medical interventions.  Family did not want to pursue comfort focused care or hospice at this time.  Family wanted to pursue patient working with PT/OT to evaluate for potential rehab placement.  Attempted to call son, Helen Richardson, and could not reach today.   -Believe would be helpful if patient's family had ongoing discussions with multiple medical providers including though not limited to hospitalist, infectious disease, and IR regarding patient's malignant perforation from theirs perspectives and interventions required as to continue allowing discussions to move forward about plans for medical care as believe family would appreciate further information from specialists.    Code Status: Full Code   -Had discussed CODE STATUS with family previously and they wanted to maintain full CODE STATUS at this time.  Unable to reach son  today.  Family has not expressed to staff change of CODE STATUS.  Did express concern that if patient was sick enough her heart were to stop or she were to stop breathing, interventions such as cardiac resuscitation and intubation with mechanical ventilation  would not lead to quality of life outcomes for patient.  # Psychosocial Support:  - Support System: sons, niece  # Discharge Planning: To Be Determined  Thank you for allowing the palliative care team to participate in the care Helen Richardson.  Alvester Morin, DO Palliative Care Provider PMT # [phone]  If patient remains symptomatic despite maximum doses, please call PMT at [phone] between 0700 and 1900. Outside of these hours, please call attending, as PMT does not have night coverage.

## 2023-04-04 NOTE — Plan of Care (Signed)

## 2023-04-04 NOTE — Progress Notes (Signed)
PROGRESS NOTE  Helen Richardson GLO:[id number] DOB: 10-07-1933 DOA: 03/28/2023 PCP: System, Provider Not In  Brief History   The patient is a 89 yr old woman who presented to [facility] ED on 03/28/2023 with complaints of right sided flank pain. The patient had had a previous admission in November 2024 for AMS after being found unresponsive. She was found to be hypoglycemic on that occasion. She gradually improved, and was discharged to outpatient follow-up. The patient states that she developed right sided flank pain and high glucoses in the past few days. Upon presentation her glucose was 629. [name] Kitchen She also had a WBC of 18, Hgb 8.8. and sodium 125. CT abdomen and pelvis which demonstrated a circumferential mass in the distal ascending colon near the hepatic flexure measuring 7 cm. There is also a enhancing complex collection in the right perirenal space measuring 6 cm with concern for tumor or metastatic disease. Interventional radiology was consulted in the emergency department for possible drain placement versus biopsy. Dr. Deanne Coffer was contacted. She was placed on insulin and started on antibiotics.   The patient has been evaluated by IR. As the patient herself is not capable of expressing herself, following commands, or making her own decisions. Her son was at bedside. The procedure's benefits and risks were  discussed with the family. Ultimately no drain was placed as the family did not give consent. Palliative care has been consulted.   The patient had a drain placed by IR on 03/31/2023. Her WBC briefly increased, but then proceeded to decline. On 04/02/2023 Palliative care had a family meeting. In this meeting the family stated that they would not be pursuing a biopsy at this point. The family wishes for the patient to work with PT and perhaps go on to rehab. They might want to revisit biopsy in the future. ID has recommended that the patient is not appropriate for long term antibiotics.. They feel that she is  better suited for oral antibiotics. The family wishes to continue the patient's CODE STATUS as full code until they discuss the matter more fully as a family.   On 2/1 I had a discussion with the patient's sons regarding her diagnosis, prognosis, and her code status. She is having pain from the drain site. The drain will not be coming out unless she undergoes surgery for the colon mass. This will be a very big surgery and a difficult one for her to recover from. She is quite weak and debilitated as it is. The alternative would be to make the patient comfort care only. The patient is also now a full code. The sons report that the patient has actually signed a document stating that she does not want any artificial measures to extend her life. I told them that she will be made a don not resuscitate based upon this information. The sons stated that they wanted a couple of days to discuss things with the family before stating whether or not they wished to pursue surgery. I explained that I would not consult surgery until I knew that they wished to pursue it.  A & P  Intraabdominal fluid collection with colon mass: The patient has had a drain placed by IR today. She has tolerated the procedure well. Aspirated fluid has been sent for gram stain and culture. ID has been consulted. Palliative care has discussed the patient with the family and initiated discussions about what their wishes will be regarding the patient's colon mass. Her diet has now been restarted.  WBC had declined from 19.1 to 15.8, but has increased again today to 23.5. This increase may be due to the drain placement yesterday.  She is receiving IV ampicillin. Blood cultures x 2 have been obtained. Gram stain is positive for PMN's and GPC. No speciation as of yet. The drain output is declining, but according to radiology will not stop unless the patient has surgery fo rthe colon cancer. It is a source of discomfort for her. Family is considering  surgery and should have a response before the end of the weekend. She will be made  DNR.   Flank pain likely related to malignancy Patient presented with abdominal pain found to have a large colon mass with adjacent fluid collection Likely related to metastatic disease or locally advanced colon cancer. The patient has had no prior colonoscopy. Palliative care has been consulted. Family meeting has been arranged for 2:00 pm on 04/02/2023. Oncology consult will be obtained once tissue diagnosis is made. WBC has declined from 19.1 to 15.8. The patient is receiving oxycodone for pain. She is using it about twice a day, although she is often in pain and does not ask for it.  Hyperglycemia in setting of poorly controlled type 2 diabetes Patient showed ketosis and hyperglycemia upon admission. However, no acidosis so concern for DKA is low. Blood sugars greater than 600 on presentation, but 63 later the day of admission. Pt on Tresiba 6 units daily prior to presentation. On admission the patient was placed on 15 units of Semglee daily with SSI. Blood sugars 134 - 216 in the last 24 hours. HbA1c is 11.7.  Dementia Moderately advanced. The patient is not situationally aware. She does not comprehend her medical situation and is unable to provide consent for procedures.   Bacteremia 1/2 bottles positive for e fecaelis. The patient is receiving IV ampicillin. Susceptibilities are pending. Will repeat cultures. WBC has increased to 26.8.This is due to the colon mass and fluid collection. Continue unasyn for now.   HTN The patient is normotensive without antihypertensives. Monitor.  GERD Continue Protonix  I have seen and examined this patient myself. I have spent 42 minutes in her evaluation and care.  DVT prophylaxis: Lovenox Code Status: Full Code Family Communication: I have discussed the patient at length with the sons including Helen Richardson who is her POA. Disposition Plan: tbd    Helen Fujii,  DO [facility] Direct contact: see [email or web address]  7PM-7AM contact night coverage as above 04/04/2023, 6:01 PM  LOS: 6 days   Consultants  IR General Surgery Palliative care  Procedures  Drain placement.  Antibiotics      Interval History/Subjective  The patient is resting comfortably in bed. No new complaints.  Objective   Vitals:  Vitals:   04/04/23 1227 04/04/23 1718  BP: 131/84 133/67  Pulse: 98 99  Resp: 16 16  Temp: 98 F (36.7 C) 97.9 F (36.6 C)  SpO2: 96% 100%    Exam:  Constitutional:  The patient is awake and alert. She is oriented only to self. No acute distress. Respiratory:  No increased work of breathing. No wheezes, rales, or rhonchi No tactile fremitus Cardiovascular:  Regular rate and rhythm No murmurs, ectopy, or gallups. No lateral PMI. No thrills. Abdomen:  Abdomen is soft, non-distended. Positive for diffuse tenderness as well as pain at drain site. No hernias, masses, or organomegaly Normoactive bowel sounds.  Musculoskeletal:  No cyanosis, clubbing, or edema Skin:  No rashes, lesions, ulcers palpation of skin: no induration  or nodules Neurologic:  CN 2-12 intact Sensation all 4 extremities intact Psychiatric:  Mental status Mood, affect appropriate Pleasantly confused   Scheduled Meds:  enoxaparin (LOVENOX) injection  40 mg Subcutaneous Q24H   insulin aspart  0-9 Units Subcutaneous TID WC   insulin glargine-yfgn  15 Units Subcutaneous Daily   pantoprazole  40 mg Oral BID   sodium chloride flush  5 mL Intracatheter Q8H   Continuous Infusions:  ampicillin-sulbactam (UNASYN) IV 3 g (04/04/23 1603)   dextrose 10 % and 0.45 % NaCl 1,000 mL with potassium chloride 40 mEq infusion 50 mL/hr at 04/04/23 1701    Principal Problem:   Intraabdominal fluid collection Active Problems:   Palliative care encounter   Colonic mass   Goals of care, counseling/discussion   Counseling and coordination of care   Need for  emotional support   Bacteremia   DNR (do not resuscitate) discussion   LOS: 6 days

## 2023-04-04 NOTE — Evaluation (Signed)
Physical Therapy Evaluation Patient Details Name: Helen Richardson MRN: [id number] DOB: March 29, 1933 Today's Date: 04/04/2023  History of Present Illness  Pt is an 89 y/o F.  Patient presented to the ER via EMS 03/28/23 with hyperglycemia and c/o right flank pain. CT abdomen/pelvis 03/28/23:   1. Circumferential heterogeneous mass in the distal ascending colon  near the hepatic flexure measuring up to 7.1 cm. Findings are  worrisome for colon neoplasm.  2. Peripherally enhancing complex collection in the right perirenal  space lateral to the kidney measuring up to 6.1 cm. This abuts the  colonic mass. Findings may represent direct extension of  tumor/necrotic tumor, metastatic disease or abscess.  RUQ drain placed in IR on 03/31/23    PMH of acute encephalopathy  hypoglycemia, DM, CHF, HTN, high cholesterol  Clinical Impression  Pt admitted with above diagnosis.  Difficult to determine baseline, no family present at time of PT eval, pt able to recall son visiting this morning.  Pt agreeable to PT, able to follow one step commands with incr time. min assist for transfers and amb ~6' with RW, distance limited by need for [facility]; returned to room to assist NT with transfer BSC to recliner. Patient will likely benefit from continued follow up therapy, <3 hours/day at d/c   Pt currently with functional limitations due to the deficits listed below (see PT Problem List). Pt will benefit from acute skilled PT to increase their independence and safety with mobility to allow discharge.           If plan is discharge home, recommend the following: A little help with bathing/dressing/bathroom;A little help with walking and/or transfers   Can travel by private vehicle   Yes    Equipment Recommendations None recommended by PT  Recommendations for Other Services       Functional Status Assessment Patient has had a recent decline in their functional status and demonstrates the ability to make significant  improvements in function in a reasonable and predictable amount of time.     Precautions / Restrictions Precautions Precautions: Fall Precaution Comments: JP drain (R upper posterior back) Restrictions Weight Bearing Restrictions Per Provider Order: No      Mobility  Bed Mobility Overal bed mobility: Needs Assistance Bed Mobility: Supine to Sit     Supine to sit: Mod assist     General bed mobility comments: incr time, assist to progress LEs off bed and elevate trunk. pt able to scoot to EOB once upright    Transfers Overall transfer level: Needs assistance Equipment used: Rolling walker (2 wheels) Transfers: Sit to/from Stand Sit to Stand: Min assist           General transfer comment: cues for safety    Ambulation/Gait Ambulation/Gait assistance: Min assist, +2 safety/equipment Gait Distance (Feet): 6 Feet Assistive device: Rolling walker (2 wheels) Gait Pattern/deviations: Step-through pattern       General Gait Details: assist to steady, cues for posture. distance limited d/t NT and RN to room during PT session stating pt had called for bathroom, assisted pt  to [facility]  Stairs            Wheelchair Mobility     Tilt Bed    Modified Rankin (Stroke Patients Only)       Balance Overall balance assessment: Needs assistance Sitting-balance support: Feet supported, No upper extremity supported Sitting balance-[name]: Fair     Standing balance support: During functional activity, Reliant on assistive device for balance Standing balance-Leahy  Scale: Poor Standing balance comment: Poor to fair, pt able to assist with removing depends for toileting with unilateral UE support                             Pertinent Vitals/Pain Pain Assessment Pain Assessment: No/denies pain    Home Living Family/patient expects to be discharged to:: Skilled nursing facility                        Prior Function Prior Level of Function :  Patient poor historian/Family not available             Mobility Comments: per PT notes in Nove 2024 pt was ambulatory with RW       Extremity/Trunk Assessment   Upper Extremity Assessment Upper Extremity Assessment: Defer to OT evaluation    Lower Extremity Assessment Lower Extremity Assessment: Generalized weakness       Communication   Communication Communication: Difficulty communicating thoughts/reduced clarity of speech  Cognition Arousal: Alert Behavior During Therapy: WFL for tasks assessed/performed Overall Cognitive Status: No family/caregiver present to determine baseline cognitive functioning Area of Impairment: Orientation, Attention, Following commands, Safety/judgement, Problem solving                 Orientation Level: Disoriented to, Situation, Time, Place   Memory: Decreased short-term memory Following Commands: Follows one step commands with increased time     Problem Solving: Requires verbal cues, Requires tactile cues, Decreased initiation          General Comments      Exercises     Assessment/Plan    PT Assessment Patient needs continued PT services  PT Problem List Decreased strength;Decreased activity tolerance;Decreased balance;Decreased knowledge of use of DME;Decreased mobility       PT Treatment Interventions DME instruction;Gait training;Functional mobility training;Therapeutic activities;Patient/family education;Therapeutic exercise    PT Goals (Current goals can be found in the Care Plan section)  Acute Rehab PT Goals PT Goal Formulation: With patient Time For Goal Achievement: 04/18/23 Potential to Achieve Goals: Good    Frequency Min 1X/week     Co-evaluation               AM-PAC PT "6 Clicks" Mobility  Outcome Measure Help needed turning from your back to your side while in a flat bed without using bedrails?: A Little Help needed moving from lying on your back to sitting on the side of a flat bed  without using bedrails?: A Little Help needed moving to and from a bed to a chair (including a wheelchair)?: A Little Help needed standing up from a chair using your arms (e.g., wheelchair or bedside chair)?: A Little Help needed to walk in hospital room?: A Lot Help needed climbing 3-5 steps with a railing? : A Lot 6 Click Score: 16    End of Session Equipment Utilized During Treatment: Gait belt Activity Tolerance: Patient tolerated treatment well Patient left: in chair;with call bell/phone within reach;with chair alarm set Nurse Communication: Mobility status PT Visit Diagnosis: Unsteadiness on feet (R26.81);Other abnormalities of gait and mobility (R26.89)    Time: 1413 (+1440-1445)-1428 PT Time Calculation (min) (ACUTE ONLY): 15 min   Charges:   PT Evaluation $PT Eval Low Complexity: 1 Low   PT General Charges $$ ACUTE PT VISIT: 1 Visit         Ahren Pettinger, PT  Acute Rehab Dept ([facility]) [phone]  04/04/2023   [facility]  04/04/2023, 3:09 PM

## 2023-04-05 DIAGNOSIS — Z7189 Other specified counseling: Secondary | ICD-10-CM | POA: Diagnosis not present

## 2023-04-05 DIAGNOSIS — K6389 Other specified diseases of intestine: Secondary | ICD-10-CM | POA: Diagnosis not present

## 2023-04-05 DIAGNOSIS — R7881 Bacteremia: Secondary | ICD-10-CM | POA: Diagnosis not present

## 2023-04-05 DIAGNOSIS — R188 Other ascites: Secondary | ICD-10-CM | POA: Diagnosis not present

## 2023-04-05 DIAGNOSIS — Z515 Encounter for palliative care: Secondary | ICD-10-CM | POA: Diagnosis not present

## 2023-04-05 LAB — CBC WITH DIFFERENTIAL/PLATELET
Abs Immature Granulocytes: 0.44 10*3/uL — ABNORMAL HIGH (ref 0.00–0.07)
Basophils Absolute: 0.1 10*3/uL (ref 0.0–0.1)
Basophils Relative: 0 %
Eosinophils Absolute: 0.2 10*3/uL (ref 0.0–0.5)
Eosinophils Relative: 1 %
HCT: 23.4 % — ABNORMAL LOW (ref 36.0–46.0)
Hemoglobin: 7.5 g/dL — ABNORMAL LOW (ref 12.0–15.0)
Immature Granulocytes: 2 %
Lymphocytes Relative: 9 %
Lymphs Abs: 1.9 10*3/uL (ref 0.7–4.0)
MCH: 23.7 pg — ABNORMAL LOW (ref 26.0–34.0)
MCHC: 32.1 g/dL (ref 30.0–36.0)
MCV: 74.1 fL — ABNORMAL LOW (ref 80.0–100.0)
Monocytes Absolute: 1.9 10*3/uL — ABNORMAL HIGH (ref 0.1–1.0)
Monocytes Relative: 10 %
Neutro Abs: 15.9 10*3/uL — ABNORMAL HIGH (ref 1.7–7.7)
Neutrophils Relative %: 78 %
Platelets: ADEQUATE 10*3/uL (ref 150–400)
RBC: 3.16 MIL/uL — ABNORMAL LOW (ref 3.87–5.11)
RDW: 19.1 % — ABNORMAL HIGH (ref 11.5–15.5)
WBC: 20.4 10*3/uL — ABNORMAL HIGH (ref 4.0–10.5)
nRBC: 0 % (ref 0.0–0.2)

## 2023-04-05 LAB — BASIC METABOLIC PANEL
Anion gap: 9 (ref 5–15)
BUN: 9 mg/dL (ref 8–23)
CO2: 21 mmol/L — ABNORMAL LOW (ref 22–32)
Calcium: 7.5 mg/dL — ABNORMAL LOW (ref 8.9–10.3)
Chloride: 102 mmol/L (ref 98–111)
Creatinine, Ser: 1.2 mg/dL — ABNORMAL HIGH (ref 0.44–1.00)
GFR, Estimated: 44 mL/min — ABNORMAL LOW (ref >60)
Glucose, Bld: 219 mg/dL — ABNORMAL HIGH (ref 70–99)
Potassium: 3.9 mmol/L (ref 3.5–5.1)
Sodium: 132 mmol/L — ABNORMAL LOW (ref 135–145)

## 2023-04-05 LAB — GLUCOSE, CAPILLARY
Glucose-Capillary: 170 mg/dL — ABNORMAL HIGH (ref 70–99)
Glucose-Capillary: 171 mg/dL — ABNORMAL HIGH (ref 70–99)
Glucose-Capillary: 176 mg/dL — ABNORMAL HIGH (ref 70–99)
Glucose-Capillary: 195 mg/dL — ABNORMAL HIGH (ref 70–99)
Glucose-Capillary: 198 mg/dL — ABNORMAL HIGH (ref 70–99)
Glucose-Capillary: 215 mg/dL — ABNORMAL HIGH (ref 70–99)
Glucose-Capillary: 222 mg/dL — ABNORMAL HIGH (ref 70–99)

## 2023-04-05 LAB — CULTURE, BLOOD (ROUTINE X 2)
Culture: NO GROWTH
Culture: NO GROWTH
Culture: NO GROWTH
Culture: NO GROWTH

## 2023-04-05 LAB — AEROBIC/ANAEROBIC CULTURE W GRAM STAIN (SURGICAL/DEEP WOUND)

## 2023-04-05 NOTE — Plan of Care (Signed)
IR is following the patient for right perirenal drain placed by Dr. Loreta Ave on 03/31/23.   F/U CT ordered, reviewed by Dr. Lowella Dandy today.  Patient is in need of drain exchange and reposition.  The procedure is tentatively scheduled for tomorrow pending IR schedule, no sedation anticipated, patient can have diet from IR stand point. MD notified.   IR APP will contact the family for consent tomorrow.   Please call IR for questions and concerns.   Lynann Bologna Kingston Guiles PA-C 04/05/2023 9:50 AM

## 2023-04-05 NOTE — Plan of Care (Signed)

## 2023-04-05 NOTE — Progress Notes (Signed)
Daily Progress Note   Patient Name: Helen Richardson       Date: 04/05/2023 DOB: 1934/02/10  Age: 89 y.o. MRN#: [id number] Attending Physician: Arnetha Courser, MD Primary Care Physician: System, Provider Not In Admit Date: 03/28/2023 Length of Stay: 7 days  Reason for Consultation/Follow-up: Establishing goals of care  Subjective:   CC: Patient sitting up in bedside chair.  Following up regarding complex medical decision making.  Subjective:  Reviewed EMR prior to presenting to bedside.  Hospitalist was able to speak with family yesterday and noted that patient had signed document stating that she does not want artificial measures to extend her life.  Patient's CODE STATUS was updated to DNR-intervene at that time.  Hospitalist yesterday and today have discussed with patient's family that they need to make a decision regarding pursuing surgical intervention versus transition to comfort focused care.  Provider had attempted to call family on 04/04/2023 without answer. This provider presented to bedside this afternoon and no family present at bedside.  Patient seen laying comfortably sleeping in bed this afternoon so did not awaken Discussed with care with bedside RN for updates.  Had meeting with patient's family last Thursday detailing possible pathways for medical care moving forward.  Had already expressed concern that patient would not have good outcomes regarding quality of life if pursuing surgical interventions.  Had explained to patient going home with hospice to support quality time and family.  Family voiced wanting to discuss with 1 another at that time.  Appears family has continued to for decisions and wanting to continue discussions amongst themselves as families.  There is plan for IR to reposition exchange drain tomorrow morning.  Objective:   Vital Signs:  BP 138/72 (BP Location: Left Arm)   Pulse (!) 103   Temp 97.8 F (36.6 C) (Oral)   Resp 16   Wt 76.1 kg   SpO2 96%    BMI 27.08 kg/m   Physical Exam: General: NAD, sleeping comfortably, chronically ill appearing  Cardiovascular: RRR Respiratory: no increased work of breathing noted, not in respiratory distress  Imaging: I personally reviewed recent imaging.   Assessment & Plan:   Assessment: Patient is an 89 year old female with a past medical history of dementia, hypertension, GERD, and diabetes who was admitted on 03/28/2023 for management of right-sided flank pain. Since admission, patient had imaging which demonstrated a circumferential mass in the distal ascending colon near the hepatic flexure measuring 7 cm and also on enhancing complex collection in the right perirenal space measuring 6 cm with concern for tumor or metastatic disease. IR consulted for evaluation. Palliative medicine team consulted to assist with complex medical decision making.   Recommendations/Plan: # Complex medical decision making/goals of care:   -Had met with patient's family (2 sons and niece) on Thursday, 04/02/2023, to review possible pathways for medical care moving forward.  Attempted to contact via phone since without answer. Multiple providers including PMT have discussed with family pathways for medical care moving forward including surgical interventions (he expressed great concern regarding risk of quality of life after this) and transition to comfort focused care going home with hospice.  Family has not made a decision regarding this as of yet.  Continue to believe that discussion from multiple providers may assist family in processing the concerns related to any surgical intervention and how there is concern this could hasten patient's end-of-life.    Code Status: Do not attempt resuscitation (DNR) PRE-ARREST INTERVENTIONS DESIRED  # Psychosocial Support:  -  Support System: sons, niece  # Discharge Planning: To Be Determined  Thank you for allowing the palliative care team to participate in the care Romie H  Dripps.  Alvester Morin, DO Palliative Care Provider PMT # [phone]  If patient remains symptomatic despite maximum doses, please call PMT at [phone] between 0700 and 1900. Outside of these hours, please call attending, as PMT does not have night coverage.

## 2023-04-05 NOTE — Progress Notes (Signed)
PROGRESS NOTE  Helen Richardson NFA:[id number] DOB: [birth year] DOA: 03/28/2023 PCP: System, Provider Not In  Brief History   The patient is a 89 yr old woman who presented to [facility] ED on 03/28/2023 with complaints of right sided flank pain. The patient had had a previous admission in November 2024 for AMS after being found unresponsive. She was found to be hypoglycemic on that occasion. She gradually improved, and was discharged to outpatient follow-up. The patient states that she developed right sided flank pain and high glucoses in the past few days. Upon presentation her glucose was 629. [name] Kitchen She also had a WBC of 18, Hgb 8.8. and sodium 125. CT abdomen and pelvis which demonstrated a circumferential mass in the distal ascending colon near the hepatic flexure measuring 7 cm. There is also a enhancing complex collection in the right perirenal space measuring 6 cm with concern for tumor or metastatic disease. Interventional radiology was consulted in the emergency department for possible drain placement versus biopsy. Dr. Deanne Coffer was contacted. She was placed on insulin and started on antibiotics.   The patient has been evaluated by IR. As the patient herself is not capable of expressing herself, following commands, or making her own decisions. Her son was at bedside. The procedure's benefits and risks were  discussed with the family. Ultimately no drain was placed as the family did not give consent. Palliative care has been consulted.   The patient had a drain placed by IR on 03/31/2023. Her WBC briefly increased, but then proceeded to decline. On 04/02/2023 Palliative care had a family meeting. In this meeting the family stated that they would not be pursuing a biopsy at this point. The family wishes for the patient to work with PT and perhaps go on to rehab. They might want to revisit biopsy in the future. ID has recommended that the patient is not appropriate for long term antibiotics.. They feel that she is  better suited for oral antibiotics. The family wishes to continue the patient's CODE STATUS as full code until they discuss the matter more fully as a family.   On 2/1 I had a discussion with the patient's sons regarding her diagnosis, prognosis, and her code status. She is having pain from the drain site. The drain will not be coming out unless she undergoes surgery for the colon mass. This will be a very big surgery and a difficult one for her to recover from. She is quite weak and debilitated as it is. The alternative would be to make the patient comfort care only. The patient is also now a full code. The sons report that the patient has actually signed a document stating that she does not want any artificial measures to extend her life. I told them that she will be made a don not resuscitate based upon this information. The sons stated that they wanted a couple of days to discuss things with the family before stating whether or not they wished to pursue surgery. I explained that I would not consult surgery until I knew that they wished to pursue it.  2/2: Hemodynamically stable, elevated CBG, patient was on D10 which was discontinued.  IR is planning to reposition and exchange drain tomorrow morning.  Family is still not very decisive about taking her back home with hospice and also does not want any extensive procedure.  Need continuation of meeting with palliative care.  Prior drain cultures with pansensitive E. coli, Streptococcus Anginosis and bacteroid.  A & P  Intraabdominal fluid collection with colon mass: The patient has had a drain placed by IR today. She has tolerated the procedure well. Aspirated fluid has been sent for gram stain and culture. ID has been consulted. Palliative care has discussed the patient with the family and initiated discussions about what their wishes will be regarding the patient's colon mass. Her diet has now been restarted.  Blood cultures x 2 have been obtained.   Drain cultures with multiple organism, pansensitive E. coli, Streptococcus Anginosis and bacteroid. The drain output is declining, but according to radiology will not stop unless the patient has surgery fo rthe colon cancer. It is a source of discomfort for her.  -Continue with Unasyn -Family still has to make decision regarding proceeding with surgery versus hospice care.  Flank pain likely related to malignancy Patient presented with abdominal pain found to have a large colon mass with adjacent fluid collection Likely related to metastatic disease or locally advanced colon cancer. The patient has had no prior colonoscopy. Palliative care has been consulted. Family meeting has been arranged for 2:00 pm on 04/02/2023. Oncology consult will be obtained once tissue diagnosis is made. WBC has declined from 19.1 to 15.8. The patient is receiving oxycodone for pain. She is using it about twice a day, although she is often in pain and does not ask for it.  Hyperglycemia in setting of poorly controlled type 2 diabetes Patient showed ketosis and hyperglycemia upon admission. However, no acidosis so concern for DKA is low. Blood sugars greater than 600 on presentation, but 63 later the day of admission. Pt on Tresiba 6 units daily prior to presentation. On admission the patient was placed on 15 units of Semglee daily with SSI. Blood sugars elevated, HbA1c is 11.7. -Stop IV fluid which contain D10 -Continue with SSI  Dementia Moderately advanced. The patient is not situationally aware. She does not comprehend her medical situation and is unable to provide consent for procedures.   Bacteremia 1/2 bottles positive for e fecaelis. The patient is receiving IV Unasyn.  - Continue unasyn for now.   HTN The patient is normotensive without antihypertensives. Monitor.  GERD Continue Protonix  I have seen and examined this patient myself. I have spent 45 minutes in her evaluation and care.  DVT  prophylaxis: Lovenox Code Status: Full Code Family Communication: I have discussed the patient at length with the sons including Helen Richardson who is her POA. Disposition Plan: tbd     Consultants  IR General Surgery Palliative care  Procedures  Drain placement.  Antibiotics      Interval History/Subjective  Patient was resting comfortably in bed when seen today.  Denies any pain.  Another lengthy discussion with 2 sons at bedside.  Family still has not made their mind whether to proceed with surgical intervention which will be quite extensive as concern of significant underlying malignancy.  Objective   Vitals:  Vitals:   04/05/23 0505 04/05/23 1126  BP: 128/69 126/77  Pulse: 96 96  Resp: 16 17  Temp: 98.1 F (36.7 C) 98 F (36.7 C)  SpO2: 100% 100%    Exam:  General.  Frail elderly lady, in no acute distress. Pulmonary.  Lungs clear bilaterally, normal respiratory effort. CV.  Regular rate and rhythm, no JVD, rub or murmur. Abdomen.  Soft, nontender, nondistended, BS positive. CNS.  Alert and oriented .  No focal neurologic deficit. Extremities.  No edema, no cyanosis, pulses intact and symmetrical.  Scheduled Meds:  enoxaparin (LOVENOX) injection  40 mg Subcutaneous Q24H   insulin aspart  0-9 Units Subcutaneous TID WC   insulin glargine-yfgn  15 Units Subcutaneous Daily   pantoprazole  40 mg Oral BID   sodium chloride flush  5 mL Intracatheter Q8H   Continuous Infusions:  ampicillin-sulbactam (UNASYN) IV 3 g (04/05/23 1038)    Arnetha Courser, MD [facility] Direct contact: see [email or web address]  7PM-7AM contact night coverage as above 04/05/2023, 11:29 AM  LOS: 7 days

## 2023-04-05 NOTE — Progress Notes (Addendum)
ID brief note  Afebrile for more than 24 hrs  Results for orders placed or performed during the hospital encounter of 03/28/23  Blood culture (routine x 2)     Status: None   Collection Time: 03/28/23 11:42 PM   Specimen: BLOOD  Result Value Ref Range Status   Specimen Description   Final    BLOOD RIGHT ANTECUBITAL Performed at [facility], [address], [place], [place] [address]    Special Requests   Final    Blood Culture adequate volume BOTTLES DRAWN AEROBIC AND ANAEROBIC Performed at [facility], [address], [place], [place] [address]    Culture   Final    NO GROWTH 5 DAYS Performed at [facility], [address], [place], [place] [address]    Report Status 04/03/2023 FINAL  Final  Blood culture (routine x 2)     Status: Abnormal   Collection Time: 03/28/23 11:47 PM   Specimen: BLOOD  Result Value Ref Range Status   Specimen Description   Final    BLOOD LEFT ANTECUBITAL Performed at [facility], [address], [place], [place] [address]    Special Requests   Final    Blood Culture results may not be optimal due to an inadequate volume of blood received in culture bottles BOTTLES DRAWN AEROBIC AND ANAEROBIC Performed at [facility], [address], [place], [place] [address]    Culture  Setup Time   Final    GRAM POSITIVE COCCI IN CHAINS ANAEROBIC BOTTLE ONLY CRITICAL RESULT CALLED TO, READ BACK BY AND VERIFIED WITH: PHARMD L POINTDEXTER 03/30/2023 @ 2154 BY AB Performed at [facility], [address] [place], [place] [address]    Culture ENTEROCOCCUS FAECALIS (A)  Final   Report Status 04/02/2023 FINAL  Final   Organism ID, Bacteria ENTEROCOCCUS FAECALIS  Final      Susceptibility   Enterococcus faecalis - MIC*    AMPICILLIN <=2 SENSITIVE Sensitive     VANCOMYCIN 1 SENSITIVE Sensitive     GENTAMICIN SYNERGY SENSITIVE Sensitive     * ENTEROCOCCUS FAECALIS  Blood  Culture ID Panel (Reflexed)     Status: Abnormal   Collection Time: 03/28/23 11:47 PM  Result Value Ref Range Status   Enterococcus faecalis DETECTED (A) NOT DETECTED Final    Comment: CRITICAL RESULT CALLED TO, READ BACK BY AND VERIFIED WITH: PHARMD L POINTDEXTER 03/30/2023 @ 2154 BY AB    Enterococcus Faecium NOT DETECTED NOT DETECTED Final   Listeria monocytogenes NOT DETECTED NOT DETECTED Final   Staphylococcus species NOT DETECTED NOT DETECTED Final   Staphylococcus aureus (BCID) NOT DETECTED NOT DETECTED Final   Staphylococcus epidermidis NOT DETECTED NOT DETECTED Final   Staphylococcus lugdunensis NOT DETECTED NOT DETECTED Final   Streptococcus species NOT DETECTED NOT DETECTED Final   Streptococcus agalactiae NOT DETECTED NOT DETECTED Final   Streptococcus pneumoniae NOT DETECTED NOT DETECTED Final   Streptococcus pyogenes NOT DETECTED NOT DETECTED Final   A.calcoaceticus-baumannii NOT DETECTED NOT DETECTED Final   Bacteroides fragilis NOT DETECTED NOT DETECTED Final   Enterobacterales NOT DETECTED NOT DETECTED Final   Enterobacter cloacae complex NOT DETECTED NOT DETECTED Final   Escherichia coli NOT DETECTED NOT DETECTED Final   Klebsiella aerogenes NOT DETECTED NOT DETECTED Final   Klebsiella oxytoca NOT DETECTED NOT DETECTED Final   Klebsiella pneumoniae NOT DETECTED NOT DETECTED Final   Proteus species NOT DETECTED NOT DETECTED Final  Salmonella species NOT DETECTED NOT DETECTED Final   Serratia marcescens NOT DETECTED NOT DETECTED Final   Haemophilus influenzae NOT DETECTED NOT DETECTED Final   Neisseria meningitidis NOT DETECTED NOT DETECTED Final   Pseudomonas aeruginosa NOT DETECTED NOT DETECTED Final   Stenotrophomonas maltophilia NOT DETECTED NOT DETECTED Final   Candida albicans NOT DETECTED NOT DETECTED Final   Candida auris NOT DETECTED NOT DETECTED Final   Candida glabrata NOT DETECTED NOT DETECTED Final   Candida krusei NOT DETECTED NOT DETECTED Final    Candida parapsilosis NOT DETECTED NOT DETECTED Final   Candida tropicalis NOT DETECTED NOT DETECTED Final   Cryptococcus neoformans/gattii NOT DETECTED NOT DETECTED Final   Vancomycin resistance NOT DETECTED NOT DETECTED Final    Comment: Performed at [facility], [address] [place], [place] [address]  Culture, blood (Routine X 2) w Reflex to ID Panel     Status: None   Collection Time: 03/31/23  9:37 AM   Specimen: BLOOD  Result Value Ref Range Status   Specimen Description   Final    BLOOD BLOOD LEFT HAND Performed at [facility], [address], [place], [place] [address]    Special Requests   Final    BOTTLES DRAWN AEROBIC ONLY Blood Culture results may not be optimal due to an inadequate volume of blood received in culture bottles Performed at [facility], [address], [place], [place] [address]    Culture   Final    NO GROWTH 5 DAYS Performed at [facility], [address] [place], [place] [address]    Report Status 04/05/2023 FINAL  Final  Culture, blood (Routine X 2) w Reflex to ID Panel     Status: None   Collection Time: 03/31/23  9:42 AM   Specimen: BLOOD  Result Value Ref Range Status   Specimen Description   Final    BLOOD BLOOD RIGHT HAND Performed at [facility], [address], [place], [place] [address]    Special Requests   Final    BOTTLES DRAWN AEROBIC ONLY Blood Culture results may not be optimal due to an inadequate volume of blood received in culture bottles Performed at [facility], [address], [place], [place] [address]    Culture   Final    NO GROWTH 5 DAYS Performed at [facility], [address] [place], [place] [address]    Report Status 04/05/2023 FINAL  Final  Aerobic/Anaerobic Culture w Gram Stain (surgical/deep wound)     Status: None (Preliminary result)   Collection Time: 03/31/23  4:42 PM   Specimen: Abdomen; Abscess  Result  Value Ref Range Status   Specimen Description   Final    ABDOMEN Performed at [facility], [address] [place], [place] [address]    Special Requests   Final    NONE Performed at [facility], [address], [place], [place] [address]    Gram Stain   Final    ABUNDANT WBC PRESENT,BOTH PMN AND MONONUCLEAR ABUNDANT GRAM POSITIVE COCCI    Culture   Final    ABUNDANT ESCHERICHIA COLI ABUNDANT STREPTOCOCCUS ANGINOSIS ABUNDANT BACTEROIDES THETAIOTAOMICRON BETA LACTAMASE POSITIVE Performed at [facility], [address] [place], [place] [address]    Report Status PENDING  Incomplete   Organism ID, Bacteria ESCHERICHIA COLI  Final      Susceptibility   Escherichia coli - MIC*  AMPICILLIN <=2 SENSITIVE Sensitive     CEFEPIME <=0.12 SENSITIVE Sensitive     CEFTAZIDIME <=1 SENSITIVE Sensitive     CEFTRIAXONE <=0.25 SENSITIVE Sensitive     CIPROFLOXACIN <=0.25 SENSITIVE Sensitive     GENTAMICIN <=1 SENSITIVE Sensitive     IMIPENEM <=0.25 SENSITIVE Sensitive     TRIMETH/SULFA <=20 SENSITIVE Sensitive     AMPICILLIN/SULBACTAM <=2 SENSITIVE Sensitive     PIP/TAZO <=4 SENSITIVE Sensitive ug/mL    * ABUNDANT ESCHERICHIA COLI  Culture, blood (Routine X 2) w Reflex to ID Panel     Status: None   Collection Time: 03/31/23  5:22 PM   Specimen: BLOOD  Result Value Ref Range Status   Specimen Description   Final    BLOOD BLOOD RIGHT HAND Performed at [facility], [address], [place], [place] [address]    Special Requests   Final    BOTTLES DRAWN AEROBIC ONLY Blood Culture results may not be optimal due to an inadequate volume of blood received in culture bottles Performed at [facility], [address] [place], [place] [address]    Culture   Final    NO GROWTH 5 DAYS Performed at [facility], [address] [place], [place] [address]    Report Status 04/05/2023 FINAL  Final   Culture, blood (Routine X 2) w Reflex to ID Panel     Status: None   Collection Time: 03/31/23  5:34 PM   Specimen: BLOOD  Result Value Ref Range Status   Specimen Description   Final    BLOOD BLOOD LEFT HAND Performed at [facility], [address] [place], [place] [address]    Special Requests   Final    BOTTLES DRAWN AEROBIC ONLY Blood Culture results may not be optimal due to an inadequate volume of blood received in culture bottles Performed at [facility], [address] [place], [place] [address]    Culture   Final    NO GROWTH 5 DAYS Performed at [facility], [address], [place], [place] [address]    Report Status 04/05/2023 FINAL  Final    Dr Daiva Eves back tomorrow  Odette Fraction, MD Infectious Disease Physician [facility] [address] [place], [place] [address] Phone: [phone]  Fax: [phone]

## 2023-04-06 ENCOUNTER — Inpatient Hospital Stay (HOSPITAL_COMMUNITY): Payer: Medicare PPO

## 2023-04-06 DIAGNOSIS — R188 Other ascites: Secondary | ICD-10-CM | POA: Diagnosis not present

## 2023-04-06 DIAGNOSIS — R4589 Other symptoms and signs involving emotional state: Secondary | ICD-10-CM | POA: Diagnosis not present

## 2023-04-06 DIAGNOSIS — Z7189 Other specified counseling: Secondary | ICD-10-CM | POA: Diagnosis not present

## 2023-04-06 DIAGNOSIS — Z515 Encounter for palliative care: Secondary | ICD-10-CM | POA: Diagnosis not present

## 2023-04-06 DIAGNOSIS — K6389 Other specified diseases of intestine: Secondary | ICD-10-CM | POA: Diagnosis not present

## 2023-04-06 DIAGNOSIS — R7881 Bacteremia: Secondary | ICD-10-CM | POA: Diagnosis not present

## 2023-04-06 HISTORY — PX: IR CATHETER TUBE CHANGE: IMG717

## 2023-04-06 LAB — GLUCOSE, CAPILLARY
Glucose-Capillary: 108 mg/dL — ABNORMAL HIGH (ref 70–99)
Glucose-Capillary: 112 mg/dL — ABNORMAL HIGH (ref 70–99)
Glucose-Capillary: 118 mg/dL — ABNORMAL HIGH (ref 70–99)
Glucose-Capillary: 133 mg/dL — ABNORMAL HIGH (ref 70–99)
Glucose-Capillary: 157 mg/dL — ABNORMAL HIGH (ref 70–99)
Glucose-Capillary: 56 mg/dL — ABNORMAL LOW (ref 70–99)
Glucose-Capillary: 63 mg/dL — ABNORMAL LOW (ref 70–99)
Glucose-Capillary: 70 mg/dL (ref 70–99)

## 2023-04-06 MED ORDER — LIDOCAINE HCL 1 % IJ SOLN
20.0000 mL | Freq: Once | INTRAMUSCULAR | Status: AC
  Administered 2023-04-06: 6 mL via INTRADERMAL

## 2023-04-06 MED ORDER — IOHEXOL 300 MG/ML  SOLN
50.0000 mL | Freq: Once | INTRAMUSCULAR | Status: AC | PRN
  Administered 2023-04-06: 10 mL

## 2023-04-06 MED ORDER — LIDOCAINE HCL 1 % IJ SOLN
INTRAMUSCULAR | Status: AC
  Filled 2023-04-06: qty 20

## 2023-04-06 NOTE — Plan of Care (Signed)

## 2023-04-06 NOTE — Progress Notes (Addendum)
         Date: 04/06/2023  Patient name: [name]  Medical record number: [id number]  Date of birth: 08/03/1933   The strep anginosus is S to PCN and can use unasyn as inpatient and change to augmentin to go home with as outlined in my last note  I will sign off for now  Please call with further questions.   Paulette Blanch Dam 04/06/2023, 2:09 PM

## 2023-04-06 NOTE — Progress Notes (Addendum)
PROGRESS NOTE  Helen Richardson ZOX:[id number] DOB: 1933/11/20 DOA: 03/28/2023 PCP: System, Provider Not In  Brief History   The patient is a 89 yr old woman who presented to [facility] ED on 03/28/2023 with complaints of right sided flank pain. The patient had had a previous admission in November 2024 for AMS after being found unresponsive. She was found to be hypoglycemic on that occasion. She gradually improved, and was discharged to outpatient follow-up. The patient states that she developed right sided flank pain and high glucoses in the past few days. Upon presentation her glucose was 629. [name] Kitchen She also had a WBC of 18, Hgb 8.8. and sodium 125. CT abdomen and pelvis which demonstrated a circumferential mass in the distal ascending colon near the hepatic flexure measuring 7 cm. There is also a enhancing complex collection in the right perirenal space measuring 6 cm with concern for tumor or metastatic disease. Interventional radiology was consulted in the emergency department for possible drain placement versus biopsy. Dr. Deanne Coffer was contacted. She was placed on insulin and started on antibiotics.   The patient has been evaluated by IR. As the patient herself is not capable of expressing herself, following commands, or making her own decisions. Her son was at bedside. The procedure's benefits and risks were  discussed with the family. Ultimately no drain was placed as the family did not give consent. Palliative care has been consulted.   The patient had a drain placed by IR on 03/31/2023. Her WBC briefly increased, but then proceeded to decline. On 04/02/2023 Palliative care had a family meeting. In this meeting the family stated that they would not be pursuing a biopsy at this point. The family wishes for the patient to work with PT and perhaps go on to rehab. They might want to revisit biopsy in the future. ID has recommended that the patient is not appropriate for long term antibiotics.. They feel that she is  better suited for oral antibiotics. The family wishes to continue the patient's CODE STATUS as full code until they discuss the matter more fully as a family.   On 2/1 I had a discussion with the patient's sons regarding her diagnosis, prognosis, and her code status. She is having pain from the drain site. The drain will not be coming out unless she undergoes surgery for the colon mass. This will be a very big surgery and a difficult one for her to recover from. She is quite weak and debilitated as it is. The alternative would be to make the patient comfort care only. The patient is also now a full code. The sons report that the patient has actually signed a document stating that she does not want any artificial measures to extend her life. I told them that she will be made a don not resuscitate based upon this information. The sons stated that they wanted a couple of days to discuss things with the family before stating whether or not they wished to pursue surgery. I explained that I would not consult surgery until I knew that they wished to pursue it.  2/2: Hemodynamically stable, elevated CBG, patient was on D10 which was discontinued.  IR is planning to reposition and exchange drain tomorrow morning.  Family is still not very decisive about taking her back home with hospice and also does not want any extensive procedure.  Need continuation of meeting with palliative care.  Prior drain cultures with pansensitive E. coli, Streptococcus Anginosis and bacteroid.  2/3: Hemodynamically stable.  Had an episode of hypoglycemia as p.o. intake remained poor.  Discontinuing Semglee, we will monitor and if persistent hypoglycemia might need to be started on D5. Family still confused about decision moving forward-another message sent to palliative care. Going for IR drain repositioning today.  A & P  Intraabdominal fluid collection with colon mass: The patient has had a drain placed by IR today. She has tolerated  the procedure well. Aspirated fluid has been sent for gram stain and culture. ID has been consulted. Palliative care has discussed the patient with the family and initiated discussions about what their wishes will be regarding the patient's colon mass. Her diet has now been restarted.  Blood cultures x 2 have been obtained.  Drain cultures with multiple organism, pansensitive E. coli, Streptococcus Anginosis and bacteroid. The drain output is declining, but according to radiology will not stop unless the patient has surgery fo rthe colon cancer. It is a source of discomfort for her.  -Continue with Unasyn -Family still has to make decision regarding proceeding with surgery versus hospice care.  Flank pain likely related to malignancy Patient presented with abdominal pain found to have a large colon mass with adjacent fluid collection Likely related to metastatic disease or locally advanced colon cancer. The patient has had no prior colonoscopy. Palliative care has been consulted. Family meeting has been arranged for 2:00 pm on 04/02/2023. Oncology consult will be obtained once tissue diagnosis is made. WBC has declined from 19.1 to 15.8. The patient is receiving oxycodone for pain. She is using it about twice a day, although she is often in pain and does not ask for it.  Hyperglycemia in setting of poorly controlled type 2 diabetes Patient showed ketosis and hyperglycemia upon admission. However, no acidosis so concern for DKA is low. Blood sugars greater than 600 on presentation, but 63 later the day of admission. Pt on Tresiba 6 units daily prior to presentation. On admission the patient was placed on 15 units of Semglee daily with SSI. Had an episode of hypoglycemia overnight after stopping D10 yesterday, discontinuing Semglee as p.o. intake remained poor HbA1c is 11.7. -Continue with SSI  Dementia Moderately advanced. The patient is not situationally aware. She does not comprehend her  medical situation and is unable to provide consent for procedures.   Bacteremia 1/2 bottles positive for e fecaelis. The patient is receiving IV Unasyn.  - Continue unasyn for now.   HTN The patient is normotensive without antihypertensives. Monitor.  GERD Continue Protonix  I have seen and examined this patient myself. I have spent 44 minutes in her evaluation and care.  DVT prophylaxis: Lovenox Code Status: Full Code Family Communication: I have discussed the patient at length with the sons including Felicita Gage who is her POA. Disposition Plan: tbd     Consultants  IR General Surgery Palliative care  Procedures  Drain placement.  Antibiotics      Interval History/Subjective  Patient was getting cleaned by nursing staff when seen today.  Had a normal bowel movement.  P.o. intake remained poor.  Unable to explain any symptoms.  Objective   Vitals:  Vitals:   04/05/23 2102 04/06/23 0557  BP: (!) 172/85 (!) 152/90  Pulse: 100 95  Resp: 16 16  Temp: 98.9 F (37.2 C) 98.2 F (36.8 C)  SpO2: 99% 100%    Exam:  General.  Frail elderly lady, in no acute distress. Pulmonary.  Lungs clear bilaterally, normal respiratory effort. CV.  Regular rate and rhythm, no  JVD, rub or murmur. Abdomen.  Soft, nontender, nondistended, BS positive. CNS.  Alert and oriented to self.  No focal neurologic deficit. Extremities.  No edema, no cyanosis, pulses intact and symmetrical.   Scheduled Meds:  enoxaparin (LOVENOX) injection  40 mg Subcutaneous Q24H   insulin aspart  0-9 Units Subcutaneous TID WC   pantoprazole  40 mg Oral BID   sodium chloride flush  5 mL Intracatheter Q8H   Continuous Infusions:  ampicillin-sulbactam (UNASYN) IV 3 g (04/06/23 0831)    Arnetha Courser, MD [facility] Direct contact: see [email or web address]  7PM-7AM contact night coverage as above 04/06/2023, 09:12 AM  LOS: 8 days

## 2023-04-06 NOTE — Progress Notes (Signed)
Daily Progress Note   Patient Name: Helen Richardson       Date: 04/06/2023 DOB: 1933-08-16  Age: 89 y.o. MRN#: [id number] Attending Physician: Arnetha Courser, MD Primary Care Physician: System, Provider Not In Admit Date: 03/28/2023 Length of Stay: 8 days  Reason for Consultation/Follow-up: Establishing goals of care  Subjective:   CC: Patient laying in bed comfortably  Following up regarding complex medical decision making.  Subjective:  Reviewed EMR prior to presenting to bedside.  Discussed care with hospitalist and RN for updates.  Presented to bedside to see patient.  No family present at bedside.  Patient resting comfortably in the bed without any symptom concerns.  Patient requested to be able to rest and that I call her son Helen Richardson to discuss her medical care.  Acknowledged this.  After visiting with patient, able to speak with Helen Richardson over the phone.  Helen Richardson requested that patient's son, Helen Richardson, be called.  Unable to reach [place] from contact information in EMR.  Then spoke to patient's grandson Helen Richardson who noted that patient's granddaughter Helen Richardson would be the primary contact.  During all of these conversations especially with Helen Richardson and Helen Richardson who had been involved in family meeting previously, discussed plan for care moving forward.  Again reviewed that patient has a colon mass that is likely cancer.  Patient has abscess which likely represents malignant perforation and without surgical repair it will not heal and require chronic drain.  Patient is an high risk for any surgical intervention.  Family is wanting to support patient enjoying quality time moving forward.  Discussed best way to accomplish this would be allowing patient to go home with hospice.  Helen Richardson noted that he and patient's granddaughter Helen Richardson were planning to provide care giving at home for patient prior to this admission.  Recommended addition of hospice support at home to layer and with their care.  CODE STATUS would be  appropriately updated to DNR/DNI as to not cause patient harm.  Helen Richardson and Helen Richardson were agreeing with this plan.  Also discussed with Helen Richardson about documentation patient had.  Actually states she does not know patient having any healthcare power of attorney documentation or advance care planning (Helen Richardson could not confirm patient having this either).  Discussed in light of this, patient's sons would technically be next of kin to assist in signing paperwork and making medical decisions on behalf of patient.  Family has acknowledged this. Family agreeing with plan to get patient home with hospice with continued oral antibiotic management to allow quality time at home.  Updated IDT regarding this transition. Informed later in day by [facility] that family is now stating they cannot provide 24/7 care at home.  Family would like to proceed with patient going to rehab with palliative potentially until he can get home caregivers scheduled.  Defer to [facility] regarding disposition planning.  Objective:   Vital Signs:  BP (!) 152/90 (BP Location: Left Arm)   Pulse 95   Temp 98.2 F (36.8 C)   Resp 16   Wt 76.1 kg   SpO2 100%   BMI 27.08 kg/m   Physical Exam: General: NAD, resting comfortably, chronically ill appearing  Cardiovascular: RRR Respiratory: no increased work of breathing noted, not in respiratory distress  Imaging: I personally reviewed recent imaging.   Assessment & Plan:   Assessment: Patient is an 89 year old female with a past medical history of dementia, hypertension, GERD, and diabetes who was admitted on 03/28/2023 for management of right-sided flank pain. Since admission,  patient had imaging which demonstrated a circumferential mass in the distal ascending colon near the hepatic flexure measuring 7 cm and also on enhancing complex collection in the right perirenal space measuring 6 cm with concern for tumor or metastatic disease. IR consulted for evaluation. Palliative medicine team consulted  to assist with complex medical decision making.   Recommendations/Plan: # Complex medical decision making/goals of care:   -Patient not participating in complex medical decision making due to medical status.  -Discussed care with multiple family members as detailed above in HPI, including patient's son Helen Richardson.  Unable to reach patient's son Helen Richardson via contact information in chart. Initially family agreeing with taking patient home (with son Helen Richardson and granddaughter Helen Richardson to support) with hospice support while continuing appropriate oral antibiotics to continue quality of life at home.  Family then informed TOC that they do not have family caregivers and instead would like patient to go to rehab with palliative services.  TOC involved to assist with discharge planning.   Code Status: Limited: Do not attempt resuscitation (DNR) -DNR-LIMITED -Do Not Intubate/DNI   # Psychosocial Support:  - Support System: sons, niece  # Discharge Planning: To Be Determined  Thank you for allowing the palliative care team to participate in the care Zarinah H Karras.  Alvester Morin, DO Palliative Care Provider PMT # [phone]  If patient remains symptomatic despite maximum doses, please call PMT at [phone] between 0700 and 1900. Outside of these hours, please call attending, as PMT does not have night coverage.  Personally spent 56 minutes in patient care including extensive chart review (labs, imaging, progress/consult notes, vital signs), medically appropraite exam, discussed with treatment team, education to patient, family, and staff, documenting clinical information, medication review and management, coordination of care, and available advanced directive documents.

## 2023-04-06 NOTE — TOC Initial Note (Signed)
Transition of Care [facility]) - Initial/Assessment Note    Patient Details  Name: Helen Richardson MRN: [id number] Date of Birth: 11-Sep-1933  Transition of [facility]) CM/SW Contact:    Amada Jupiter, LCSW Phone Number: 04/06/2023, 3:06 PM  Clinical Narrative:                  Met with pt and have spoken with granddaughter, Morrie Sheldon via phone to review dc plans.  Both confirm that pt was living alone with granddaughter checking in on her as her primary support person.  Pt would like to dc home, however, family cannot provide 24/7 care at this time.  Granddaughter and sons have been able to speak with Palliative team and are agreed with plan for palliative or hospice involvement at this point.  Family does note that pt had been overall mod ind at home prior to this decline.  They would like to have pt dc to SNF to receive a little rehab and they will continue to work on plan of how they might provide 24/7 support in the home.  They are aware that SNF rehab is short term plan only.  Will begin SNF bed search.  Expected Discharge Plan: Skilled Nursing Facility Barriers to Discharge: Continued Medical Work up, English as a second language teacher, SNF Pending bed offer   Patient Goals and CMS Choice Patient states their goals for this hospitalization and ongoing recovery are:: return home following rehab          Expected Discharge Plan and Services In-house Referral: Clinical Social Work   Post Acute Care Choice: Skilled Nursing Facility Living arrangements for the past 2 months: Single Family Home                 DME Arranged: N/A DME Agency: NA                  Prior Living Arrangements/Services Living arrangements for the past 2 months: Single Family Home Lives with:: Self Patient language and need for interpreter reviewed:: Yes Do you feel safe going back to the place where you live?: Yes               Activities of Daily Living   ADL Screening (condition at time of  admission) Independently performs ADLs?: No Does the patient have a NEW difficulty with bathing/dressing/toileting/self-feeding that is expected to last >3 days?: No Does the patient have a NEW difficulty with getting in/out of bed, walking, or climbing stairs that is expected to last >3 days?: No Does the patient have a NEW difficulty with communication that is expected to last >3 days?: No Is the patient deaf or have difficulty hearing?: No Does the patient have difficulty seeing, even when wearing glasses/contacts?: No Does the patient have difficulty concentrating, remembering, or making decisions?: Yes  Permission Sought/Granted Permission sought to share information with : Family Supports Permission granted to share information with : Yes, Verbal Permission Granted  Share Information with NAME: granddaughter, Aliene Tamura @ [phone]           Emotional Assessment Appearance:: Appears stated age Attitude/Demeanor/Rapport: Gracious Affect (typically observed): Accepting Orientation: : Oriented to Self, Oriented to Place Alcohol / Substance Use: Not Applicable Psych Involvement: No (comment)  Admission diagnosis:  Colonic mass [K63.89] Intraabdominal fluid collection [R18.8] Patient Active Problem List   Diagnosis Date Noted   DNR (do not resuscitate) discussion 04/02/2023   Bacteremia 04/01/2023   Palliative care encounter 03/31/2023   Colonic mass 03/31/2023   Goals  of care, counseling/discussion 03/31/2023   Counseling and coordination of care 03/31/2023   Need for emotional support 03/31/2023   Intraabdominal fluid collection 03/29/2023   Microcytic anemia 01/17/2023   SIRS (systemic inflammatory response syndrome) (HCC) 01/16/2023   Chronic heart failure with preserved ejection fraction (HFpEF) (HCC) 01/16/2023   Acute encephalopathy 01/16/2023   Intermediate stage nonexudative age-related macular degeneration of both eyes 07/04/2019   Posterior vitreous  detachment of right eye 07/04/2019   Degenerative retinal drusen of both eyes 07/04/2019   Chorioretinal scar 07/04/2019   Pincer nail deformity 04/12/2019   CKD stage 3a, GFR 45-59 ml/min (HCC) 06/11/2018   Sepsis due to gram-negative UTI (HCC) 06/11/2018   Acute lower UTI (urinary tract infection) 06/10/2018   HTN (hypertension) 06/10/2018   Joint swelling 06/10/2018   Hyperkalemia 06/10/2018   Hyponatremia 06/10/2018   Normocytic anemia 06/10/2018   Type 2 diabetes mellitus with stage 3 chronic kidney disease, with long-term current use of insulin (HCC) 03/08/2018   Hypertensive nephropathy 03/08/2018   Pure hypercholesterolemia 03/08/2018   Class 1 obesity due to excess calories with serious comorbidity and body mass index (BMI) of 34.0 to 34.9 in adult 03/08/2018   PCP:  System, Provider Not In Pharmacy:   [id number] - [place],  - [address] AT [facility] [address] [place] [address] Phone: [phone] Fax: [phone]  [facility] - [place], [place] - [address] [place] [address] Phone: [phone] Fax: [phone]  [facility] - [place], [place] - [address] [place] [address] Phone: [phone] Fax: [phone]     Social Drivers of Health (SDOH) Social History: SDOH Screenings   Food Insecurity: Patient Unable To Answer (03/30/2023)  Housing: Unknown (03/30/2023)  Transportation Needs: No Transportation Needs (03/30/2023)  Utilities: Not At Risk (03/30/2023)  Alcohol Screen: Low Risk  (10/18/2021)  Depression (PHQ2-9): Low Risk  (10/18/2021)  Financial Resource Strain: Low Risk  (10/18/2021)  Physical Activity: Inactive (10/18/2021)  Social Connections: Patient Unable To Answer (03/30/2023)  Stress: No Stress Concern Present (10/18/2021)  Tobacco Use: Low Risk  (03/29/2023)   SDOH Interventions: Food Insecurity  Interventions: Other (Comment) Housing Interventions: Intervention Not Indicated Transportation Interventions: Intervention Not Indicated Utilities Interventions: Patient Unable to Answer Social Connections Interventions: Patient Unable to Answer   Readmission Risk Interventions    04/06/2023    2:55 PM  Readmission Risk Prevention Plan  Transportation Screening Complete  PCP or Specialist Appt within 5-7 Days Complete  Home Care Screening Complete  Medication Review (RN CM) Complete

## 2023-04-06 NOTE — Progress Notes (Addendum)
Patient's CBG was 56. Patient drank 8 oz of orange juice. Will recheck sugar in 15 minutes.  CBG went up to 63. Gave another round of orange juice and will recheck CBG.

## 2023-04-07 DIAGNOSIS — R188 Other ascites: Secondary | ICD-10-CM | POA: Diagnosis not present

## 2023-04-07 LAB — GLUCOSE, CAPILLARY
Glucose-Capillary: 81 mg/dL (ref 70–99)
Glucose-Capillary: 99 mg/dL (ref 70–99)
Glucose-Capillary: 188 mg/dL — ABNORMAL HIGH (ref 70–99)
Glucose-Capillary: 127 mg/dL — ABNORMAL HIGH (ref 70–99)

## 2023-04-07 NOTE — Progress Notes (Addendum)
 PROGRESS NOTE Helen Richardson  FMW:[id number] DOB: 03-16-1933 DOA: 03/28/2023 PCP: System, Provider Not In  Brief Narrative/Hospital Course: 89 yr old woman who presented to [facility] ED on 03/28/2023 with complaints of right sided flank pain. The patient had had a previous admission in November 2024 for AMS after being found unresponsive. She was found to be hypoglycemic on that occasion. She gradually improved, and was discharged to outpatient follow-up. The patient states that she developed right sided flank pain and high glucoses in the past few days. Upon presentation her glucose was 629.She also had a WBC of 18, Hgb 8.8. and sodium 125. CT abdomen and pelvis which demonstrated a circumferential mass in the distal ascending colon near the hepatic flexure measuring 7 cm. There is also a enhancing complex collection in the right perirenal space measuring 6 cm with concern for tumor or metastatic disease. IR was consulted in the ED for possible drain placement versus biopsy, AND admitted. The patient has been evaluated by IR. As the patient herself is not capable of expressing herself, following commands, or making her own decisions. Her son was at bedside and drain was placed 1/28. Palliative care has been consulted.  Subsequently, milligram by worsening leukocytosis then proceeded to decline. On 04/02/2023 Palliative care had a family meeting: Family decided not to pursue a biopsy, wished for the patient to work with PT and perhaps go on to rehab. They might want to revisit biopsy in the future. ID was consulted and felt she is not appropriate for long term antibiotics.They feel that she is better suited for oral antibiotics.  2/1 had a discussion with the patient's sons regarding her diagnosis, prognosis, and her code status. She is having pain from the drain site. The drain will not be coming out unless she undergoes surgery for the colon mass. This will be a very big surgery and a difficult one for her to  recover from. She is quite weak and debilitated as it is. The alternative would be to make the patient comfort care only.  The sons report that the patient has actually signed a document stating that she does not want any artificial measures to extend her life, family encouraged to discuss about CODE STATUS. 2/2: Elevated CBG, her D10 - discontinued.Family is still not very decisive about taking her back home with hospice and also does not want any extensive procedure. Prior drain cultures with pansensitive E. coli, Streptococcus Anginosis and bacteroid> strep angionsus is sensitive to penicillin, okay to keep Unasyn  and changed to Augmentin  upon discharge per ID. 2/3: episode of hypoglycemia as p.o. intake remained poor- dscontinued Semglee , IR plans for repositioning  drain.  Subjective: Patient seen and examined this morning. She is alert awake able to tell me her name current hospitalization, president Reports she had  surgery for her colon referring to the drain Nursing reports difficulty with feeding speech has been consulted, diet changed   Assessment and Plan: Principal Problem:   Intraabdominal fluid collection Active Problems:   Palliative care encounter   Colonic mass   Goals of care, counseling/discussion   Counseling and coordination of care   Need for emotional support   Bacteremia   DNR (do not resuscitate) discussion   Intra-abdominal fluid collection-abscess Colonic Mass Flank pain likely from malignancy/mass: Intra-abdominal fluid collection/abscess in the setting of colon mass, not a candidate for surgery subsequently underwent IR placed drainage and drain fluid with E. coli Streptococcus and bacteroids, also bacteremic.  Seen by ID, continue on IV  antibiotics Unasyn  while here> discharge changed to Augmentin . Concern about likely metastatic disease or locally advanced colon cancer no prior colonoscopy palliative care has seen the patient recommending discharge home  with hospice-no plan for further biopsy after discussion with the family.  Continue pain control  Bacteremia with E faecalis-likely intra-abdominal source with abscess Leukocytosis: Prior drain cultures with pansensitive E. coli, Streptococcus Anginosis and bacteroid> strep angionsus is sensitive to penicillin, okay to keep Unasyn  and changed to Augmentin  upon discharge per ID.  Repeat blood cultures have been negative, echocardiogram was suboptimal but did not show any evidence of endocarditis per ID she would not be appropriate for long-term IV antibiotics.  Type 2 diabetes mellitus with uncontrolled Hyperglycemia  Episodes of hypoglycemia due to poor oral intake: Off long-acting insulin  on SSI 0 to 9 units, watch blood sugar closely encourage p.o. Recent Labs  Lab 04/06/23 0923 04/06/23 1144 04/06/23 1715 04/06/23 2011 04/07/23 0723  GLUCAP 112* 133* 108* 157* 81    Dementia Debilitated/deconditioning: Fairly alert awake, continue supportive care delirium precaution  CKD stage IIIa: creatinine 1.2 with poor oral intake, varying from 0.7-1.2.  Hyponatremia mild: Likely with poor oral intake and?  SIADH  Anemia of chronic disease likely from malignancy: Hemoglobin holding around 7.5 g.b/l hb around 7.7-8.5gm.  Monitor intermittently  Essential hypertension: BP well-controlled without meds  GERD: On PPI  Goals of care: Patient has been changed to DNR limited, extensive meeting were held with palliative care and the family in the light of her colon mass likely malignancy with dementia deconditioning debility, initial plan was discharged home with hospice but at this time family unable to provide 24/7 care at home and they would like to proceed with patient going into rehab with palliative potentially until she can get home care giver scheduled  DVT prophylaxis: enoxaparin  (LOVENOX ) injection 40 mg Start: 04/01/23 1000 SCDs Start: 03/29/23 0034 Code Status:   Code Status:  Limited: Do not attempt resuscitation (DNR) -DNR-LIMITED -Do Not Intubate/DNI  Family Communication: plan of care discussed with patient/none at bedside. Patient status is: Remains hospitalized because of severity of illness Level of care: Med-Surg   Dispo: The patient is from: home            Anticipated disposition: TBD-family unable to take patient home with hospice given lack of 24/7 caregiver,-they want to pursue SNF.  Objective: Vitals last 24 hrs: Vitals:   04/06/23 0557 04/06/23 1402 04/06/23 2014 04/07/23 0500  BP: (!) 152/90 138/71 (!) 160/86 125/67  Pulse: 95 (!) 104 100 99  Resp: 16  (!) 24 (!) 22  Temp: 98.2 F (36.8 C) 98 F (36.7 C) 97.6 F (36.4 C) 98.7 F (37.1 C)  TempSrc:  Oral Oral Oral  SpO2: 100% 95% 96% 100%  Weight:       Weight change:   Physical Examination: General exam: alert awake,at baseline, older than stated age HEENT:Oral mucosa moist, Ear/Nose WNL grossly Respiratory system: Bilaterally clear BS,no use of accessory muscle Cardiovascular system: S1 & S2 +, No JVD. Gastrointestinal system: Abdomen soft,NT,ND, BS+ Nervous System: Alert, awake, moving all extremities,and following commands. Extremities: LE edema neg,distal peripheral pulses palpable and warm.  Skin: No rashes,no icterus. MSK: Normal muscle bulk,tone, power   Medications reviewed:  Scheduled Meds:  enoxaparin  (LOVENOX ) injection  40 mg Subcutaneous Q24H   insulin  aspart  0-9 Units Subcutaneous TID WC   pantoprazole   40 mg Oral BID   sodium chloride  flush  5 mL Intracatheter Q8H  Continuous Infusions:  ampicillin -sulbactam (UNASYN ) IV 3 g (04/07/23 0953)    Diet Order             Diet Carb Modified Fluid consistency: Thin; Room service appropriate? Yes  Diet effective now                  Intake/Output Summary (Last 24 hours) at 04/07/2023 1118 Last data filed at 04/07/2023 1001 Gross per 24 hour  Intake 890.11 ml  Output 305 ml  Net 585.11 ml   Net IO Since  Admission: 8,982.62 mL [04/07/23 1118]  Wt Readings from Last 3 Encounters:  03/28/23 76.1 kg  01/17/23 76.1 kg  11/19/22 79.8 kg     Unresulted Labs (From admission, onward)    None     Data Reviewed: I have personally reviewed following labs and imaging studies CBC: Recent Labs  Lab 04/01/23 0508 04/02/23 0455 04/04/23 0533 04/05/23 0512  WBC 23.5* 17.5* 26.8* 20.4*  NEUTROABS 18.8* 13.3* 21.0* 15.9*  HGB 9.0* 7.9* 7.4* 7.5*  HCT 29.6* 25.4* 23.2* 23.4*  MCV 79.6* 77.0* 75.6* 74.1*  PLT 373 386 372 PLATELET CLUMPS NOTED ON SMEAR, COUNT APPEARS ADEQUATE   Basic Metabolic Panel:  Recent Labs  Lab 04/01/23 0508 04/02/23 0455 04/04/23 0533 04/05/23 0512  NA 134* 136 134* 132*  K 4.6 3.6 3.8 3.9  CL 102 103 102 102  CO2 22 24 23  21*  GLUCOSE 223* 63* 148* 219*  BUN 12 10 10 9   CREATININE 1.13* 1.00 1.28* 1.20*  CALCIUM  8.0* 8.0* 7.5* 7.5*   GFR: Estimated Creatinine Clearance: 33.8 mL/min (A) (by C-G formula based on SCr of 1.2 mg/dL (H)). Liver Function Tests:  Recent Labs  Lab 04/06/23 0923 04/06/23 1144 04/06/23 1715 04/06/23 2011 04/07/23 0723  GLUCAP 112* 133* 108* 157* 81   No results for input(s): CHOL, HDL, LDLCALC, TRIG, CHOLHDL, LDLDIRECT in the last 72 hours. No results for input(s): TSH, T4TOTAL, FREET4, T3FREE, THYROIDAB in the last 72 hours. Sepsis Labs: No results for input(s): PROCALCITON, LATICACIDVEN in the last 168 hours. Recent Results (from the past 240 hours)  Blood culture (routine x 2)     Status: None   Collection Time: 03/28/23 11:42 PM   Specimen: BLOOD  Result Value Ref Range Status   Specimen Description   Final    BLOOD RIGHT ANTECUBITAL Performed at [facility], [address] [place], [place] [address]    Special Requests   Final    Blood Culture adequate volume BOTTLES DRAWN AEROBIC AND ANAEROBIC Performed at [facility], [address],  [place], [place] [address]    Culture   Final    NO GROWTH 5 DAYS Performed at [facility], [address], [place], [place] [address]    Report Status 04/03/2023 FINAL  Final  Blood culture (routine x 2)     Status: Abnormal   Collection Time: 03/28/23 11:47 PM   Specimen: BLOOD  Result Value Ref Range Status   Specimen Description   Final    BLOOD LEFT ANTECUBITAL Performed at [facility], [address], [place], [place] [address]    Special Requests   Final    Blood Culture results may not be optimal due to an inadequate volume of blood received in culture bottles BOTTLES DRAWN AEROBIC AND ANAEROBIC Performed at [facility], [address] [place], [place] [address]    Culture  Setup Time   Final  GRAM POSITIVE COCCI IN CHAINS ANAEROBIC BOTTLE ONLY CRITICAL RESULT CALLED TO, READ BACK BY AND VERIFIED WITH: PHARMD L POINTDEXTER 03/30/2023 @ 2154 BY AB Performed at [facility], [address], [place], [place] [address]    Culture ENTEROCOCCUS FAECALIS (A)  Final   Report Status 04/02/2023 FINAL  Final   Organism ID, Bacteria ENTEROCOCCUS FAECALIS  Final      Susceptibility   Enterococcus faecalis - MIC*    AMPICILLIN  <=2 SENSITIVE Sensitive     VANCOMYCIN  1 SENSITIVE Sensitive     GENTAMICIN SYNERGY SENSITIVE Sensitive     * ENTEROCOCCUS FAECALIS  Blood Culture ID Panel (Reflexed)     Status: Abnormal   Collection Time: 03/28/23 11:47 PM  Result Value Ref Range Status   Enterococcus faecalis DETECTED (A) NOT DETECTED Final    Comment: CRITICAL RESULT CALLED TO, READ BACK BY AND VERIFIED WITH: PHARMD L POINTDEXTER 03/30/2023 @ 2154 BY AB    Enterococcus Faecium NOT DETECTED NOT DETECTED Final   Listeria monocytogenes NOT DETECTED NOT DETECTED Final   Staphylococcus species NOT DETECTED NOT DETECTED Final   Staphylococcus aureus (BCID) NOT DETECTED NOT DETECTED Final   Staphylococcus epidermidis NOT DETECTED NOT DETECTED  Final   Staphylococcus lugdunensis NOT DETECTED NOT DETECTED Final   Streptococcus species NOT DETECTED NOT DETECTED Final   Streptococcus agalactiae NOT DETECTED NOT DETECTED Final   Streptococcus pneumoniae NOT DETECTED NOT DETECTED Final   Streptococcus pyogenes NOT DETECTED NOT DETECTED Final   A.calcoaceticus-baumannii NOT DETECTED NOT DETECTED Final   Bacteroides fragilis NOT DETECTED NOT DETECTED Final   Enterobacterales NOT DETECTED NOT DETECTED Final   Enterobacter cloacae complex NOT DETECTED NOT DETECTED Final   Escherichia coli NOT DETECTED NOT DETECTED Final   Klebsiella aerogenes NOT DETECTED NOT DETECTED Final   Klebsiella oxytoca NOT DETECTED NOT DETECTED Final   Klebsiella pneumoniae NOT DETECTED NOT DETECTED Final   Proteus species NOT DETECTED NOT DETECTED Final   Salmonella species NOT DETECTED NOT DETECTED Final   Serratia marcescens NOT DETECTED NOT DETECTED Final   Haemophilus influenzae NOT DETECTED NOT DETECTED Final   Neisseria meningitidis NOT DETECTED NOT DETECTED Final   Pseudomonas aeruginosa NOT DETECTED NOT DETECTED Final   Stenotrophomonas maltophilia NOT DETECTED NOT DETECTED Final   Candida albicans NOT DETECTED NOT DETECTED Final   Candida auris NOT DETECTED NOT DETECTED Final   Candida glabrata NOT DETECTED NOT DETECTED Final   Candida krusei NOT DETECTED NOT DETECTED Final   Candida parapsilosis NOT DETECTED NOT DETECTED Final   Candida tropicalis NOT DETECTED NOT DETECTED Final   Cryptococcus neoformans/gattii NOT DETECTED NOT DETECTED Final   Vancomycin  resistance NOT DETECTED NOT DETECTED Final    Comment: Performed at [facility], [address] [place], [place] [address]  Culture, blood (Routine X 2) w Reflex to ID Panel     Status: None   Collection Time: 03/31/23  9:37 AM   Specimen: BLOOD  Result Value Ref Range Status   Specimen Description   Final    BLOOD BLOOD LEFT HAND Performed at [facility], [address] [place], [place] [address]    Special Requests   Final    BOTTLES DRAWN AEROBIC ONLY Blood Culture results may not be optimal due to an inadequate volume of blood received in culture bottles Performed at [facility], [address], [place], [place] [address]    Culture   Final    NO GROWTH  5 DAYS Performed at [facility], [address], [place], [place] [address]    Report Status 04/05/2023 FINAL  Final  Culture, blood (Routine X 2) w Reflex to ID Panel     Status: None   Collection Time: 03/31/23  9:42 AM   Specimen: BLOOD  Result Value Ref Range Status   Specimen Description   Final    BLOOD BLOOD RIGHT HAND Performed at [facility], [address] [place], [place] [address]    Special Requests   Final    BOTTLES DRAWN AEROBIC ONLY Blood Culture results may not be optimal due to an inadequate volume of blood received in culture bottles Performed at [facility], [address] [place], [place] [address]    Culture   Final    NO GROWTH 5 DAYS Performed at [facility], [address], [place], [place] [address]    Report Status 04/05/2023 FINAL  Final  Aerobic/Anaerobic Culture w Gram Stain (surgical/deep wound)     Status: None   Collection Time: 03/31/23  4:42 PM   Specimen: Abdomen; Abscess  Result Value Ref Range Status   Specimen Description   Final    ABDOMEN Performed at [facility], [address], [place], [place] [address]    Special Requests   Final    NONE Performed at [facility], [address] [place], [place] [address]    Gram Stain   Final    ABUNDANT WBC PRESENT,BOTH PMN AND MONONUCLEAR ABUNDANT GRAM POSITIVE COCCI    Culture   Final    ABUNDANT ESCHERICHIA COLI ABUNDANT STREPTOCOCCUS ANGINOSIS ABUNDANT BACTEROIDES THETAIOTAOMICRON BETA LACTAMASE POSITIVE Performed at [facility], [address] [place], [place]  [address]    Report Status 04/05/2023 FINAL  Final   Organism ID, Bacteria ESCHERICHIA COLI  Final   Organism ID, Bacteria STREPTOCOCCUS ANGINOSIS  Final      Susceptibility   Escherichia coli - MIC*    AMPICILLIN  <=2 SENSITIVE Sensitive     CEFEPIME <=0.12 SENSITIVE Sensitive     CEFTAZIDIME <=1 SENSITIVE Sensitive     CEFTRIAXONE  <=0.25 SENSITIVE Sensitive     CIPROFLOXACIN <=0.25 SENSITIVE Sensitive     GENTAMICIN <=1 SENSITIVE Sensitive     IMIPENEM <=0.25 SENSITIVE Sensitive     TRIMETH/SULFA <=20 SENSITIVE Sensitive     AMPICILLIN /SULBACTAM <=2 SENSITIVE Sensitive     PIP/TAZO <=4 SENSITIVE Sensitive ug/mL    * ABUNDANT ESCHERICHIA COLI   Streptococcus anginosis - MIC*    PENICILLIN <=0.06 SENSITIVE Sensitive     CEFTRIAXONE  <=0.12 SENSITIVE Sensitive     ERYTHROMYCIN <=0.12 SENSITIVE Sensitive     LEVOFLOXACIN 0.5 SENSITIVE Sensitive     VANCOMYCIN  0.5 SENSITIVE Sensitive     * ABUNDANT STREPTOCOCCUS ANGINOSIS  Culture, blood (Routine X 2) w Reflex to ID Panel     Status: None   Collection Time: 03/31/23  5:22 PM   Specimen: BLOOD  Result Value Ref Range Status   Specimen Description   Final    BLOOD BLOOD RIGHT HAND Performed at [facility], [address], [place], [place] [address]    Special Requests   Final    BOTTLES DRAWN AEROBIC ONLY Blood Culture results may not be optimal due to an inadequate volume of blood received in culture bottles Performed at [facility], [address] [place], [place] [address]    Culture  Final    NO GROWTH 5 DAYS Performed at [facility], [address], [place], [place] [address]    Report Status 04/05/2023 FINAL  Final  Culture, blood (Routine X 2) w Reflex to ID Panel     Status: None   Collection Time: 03/31/23  5:34 PM   Specimen: BLOOD  Result Value Ref Range Status   Specimen Description   Final    BLOOD BLOOD LEFT HAND Performed at [facility], [address] [place], [place] [address]    Special Requests   Final    BOTTLES DRAWN AEROBIC ONLY Blood Culture results may not be optimal due to an inadequate volume of blood received in culture bottles Performed at [facility], [address], [place], [place] [address]    Culture   Final    NO GROWTH 5 DAYS Performed at [facility], [address] [place], [place] [address]    Report Status 04/05/2023 FINAL  Final    Antimicrobials/Microbiology: Anti-infectives (From admission, onward)    Start     Dose/Rate Route Frequency Ordered Stop   03/31/23 1600  Ampicillin -Sulbactam (UNASYN ) 3 g in sodium chloride  0.9 % 100 mL IVPB        3 g 200 mL/hr over 30 Minutes Intravenous Every 6 hours 03/31/23 1451     03/30/23 2300  ampicillin  (OMNIPEN) 2 g in sodium chloride  0.9 % 100 mL IVPB  Status:  Discontinued        2 g 300 mL/hr over 20 Minutes Intravenous Every 6 hours 03/30/23 2204 03/31/23 1451   03/29/23 1000  ceFEPIme (MAXIPIME) 2 g in sodium chloride  0.9 % 100 mL IVPB  Status:  Discontinued        2 g 200 mL/hr over 30 Minutes Intravenous Every 12 hours 03/28/23 2348 03/29/23 0040   03/29/23 0000  vancomycin  (VANCOCIN ) IVPB 1000 mg/200 mL premix  Status:  Discontinued        1,000 mg 200 mL/hr over 60 Minutes Intravenous  Once 03/28/23 2348 03/29/23 0040   03/28/23 2215  cefTRIAXone  (ROCEPHIN ) 2 g in sodium chloride  0.9 % 100 mL IVPB        2 g 200 mL/hr over 30 Minutes Intravenous  Once 03/28/23 2203 03/29/23 0002         Component Value Date/Time   SDES  03/31/2023 1734    BLOOD BLOOD LEFT HAND Performed at [facility], [address], [place], [place] [address]    SPECREQUEST  03/31/2023 1734    BOTTLES DRAWN AEROBIC ONLY Blood Culture results may not be optimal due to an inadequate volume of blood received in culture bottles Performed at [facility], [address] [place], [place] [address]    CULT   03/31/2023 1734    NO GROWTH 5 DAYS Performed at [facility], [address] [place], [place] [address]    REPTSTATUS 04/05/2023 FINAL 03/31/2023 1734     Radiology Studies: IR Catheter Tube Change Result Date: 04/06/2023 INDICATION: Right-sided colon mass, retroperitoneal abscess, status post percutaneous drain catheter placement 03/31/2023. Follow-up CT showed retraction of the catheter, proximal side hole nearly at the skin surface. Continued output from catheter 25 cc per day. Catheter revision requested before discharge EXAM: EXCHANGE OF ABSCESS DRAIN CATHETER UNDER FLUOROSCOPY MEDICATIONS: The patient is currently admitted to the hospital and receiving intravenous antibiotics. The antibiotics were administered within an appropriate time frame prior to  the initiation of the procedure. ANESTHESIA/SEDATION: 1% lidocaine  subcutaneous COMPLICATIONS: None immediate. PROCEDURE: Informed written consent was obtained from the patient after a thorough discussion of the procedural risks, benefits and alternatives. All questions were addressed. Maximal Sterile Barrier Technique was utilized including caps, mask, sterile gowns, sterile gloves, sterile drape, hand hygiene and skin antiseptic. A timeout was performed prior to the initiation of the procedure. Catheter and surrounding skin prepped with Betadine, draped in usual sterile fashion, infiltrated locally with 1% lidocaine . Small contrast injection under fluoroscopy demonstrated flow to the residual retroperitoneal abscess cavity. No fistula to bowel demonstrated. The catheter was cut and exchanged over an Amplatz wire for a new 10 French pigtail drain catheter, forms within the deep portion of the residual retroperitoneal collection. Catheter position confirmed on fluoroscopy. Catheter was flushed, secured externally with 0 Prolene suture and StatLock and placed to gravity drain bag. The patient tolerated the procedure well. IMPRESSION: 1. Technically  successful exchange and revision of 10 French right retroperitoneal pigtail abscess drain catheter under fluoroscopy. Electronically Signed   By: JONETTA Faes M.D.   On: 04/06/2023 17:14     LOS: 9 days   Total time spent in review of labs and imaging, patient evaluation, formulation of plan, documentation and communication with family: 35 minutes  Mennie LAMY, MD  [facility]  04/07/2023, 11:18 AM

## 2023-04-07 NOTE — Plan of Care (Signed)
   Problem: Nutrition: Goal: Adequate nutrition will be maintained Outcome: Progressing

## 2023-04-07 NOTE — Hospital Course (Addendum)
 89 yr old woman who presented to [facility] ED on 03/28/2023 with complaints of right sided flank pain. The patient had had a previous admission in November 2024 for AMS after being found unresponsive. She was found to be hypoglycemic on that occasion. She gradually improved, and was discharged to outpatient follow-up. The patient states that she developed right sided flank pain and high glucoses in the past few days. Upon presentation her glucose was 629.She also had a WBC of 18, Hgb 8.8. and sodium 125. CT abdomen and pelvis which demonstrated a circumferential mass in the distal ascending colon near the hepatic flexure measuring 7 cm. There is also a enhancing complex collection in the right perirenal space measuring 6 cm with concern for tumor or metastatic disease. IR was consulted in the ED for possible drain placement versus biopsy, AND admitted. The patient has been evaluated by IR. As the patient herself is not capable of expressing herself, following commands, or making her own decisions. Her son was at bedside and drain was placed 1/28. Palliative care has been consulted.  Subsequently, milligram by worsening leukocytosis then proceeded to decline. On 04/02/2023 Palliative care had a family meeting: Family decided not to pursue a biopsy, wished for the patient to work with PT and perhaps go on to rehab. They might want to revisit biopsy in the future. ID was consulted and felt she is not appropriate for long term antibiotics.They feel that she is better suited for oral antibiotics.  2/1 had a discussion with the patient's sons regarding her diagnosis, prognosis, and her code status. She is having pain from the drain site. The drain will not be coming out unless she undergoes surgery for the colon mass. This will be a very big surgery and a difficult one for her to recover from. She is quite weak and debilitated as it is. The alternative would be to make the patient comfort care only.  The sons report that the  patient has actually signed a document stating that she does not want any artificial measures to extend her life, family encouraged to discuss about CODE STATUS. 2/2: Elevated CBG, her D10 - discontinued.Family is still not very decisive about taking her back home with hospice and also does not want any extensive procedure. Prior drain cultures with pansensitive E. coli, Streptococcus Anginosis and bacteroid> strep angionsus is sensitive to penicillin, okay to keep Unasyn  and changed to Augmentin  upon discharge per ID. 2/3: episode of hypoglycemia as p.o. intake remained poor- dscontinued Semglee , IR repositioned  drain. Palliative care had further meeting and plan was discharged home with hospice care and family were agreeable.  But subsequently family informed that they do not have 24/7 caregiver at home so unable to go home with hospice and they want to pursue skilled nursing facility and have palliative care follow-up.  With her bacteremia and intra-abdominal fluid collection abscess plan is to discharge on oral Augmentin  Patient having some cough and also ongoing shortness of breath needing supplemental oxygen Further chest x-ray showed pleural effusion IR consulted but unable to tap as fluid is too small and loculated CTA chest 2/7> no PE but large right and small left layering pleural effusion with compressive atelectasis/compressive collapse of right lower lobe mild shift of the heart over to the left, thickening of the colon on the splenic flexure, ascending colon mass and small tail hepatic ascites and mild anasarca noticed.  Patient was given  IV Lasix  and home Lasix  resumed as well. 04/11/23 1 unit transfusion  ordered but only received half due to IV line coming ou, unable to replace IV line but subsequently midline was placed by PICC> hemoglobin came up initially 7.6 but again overnight down to 6.4 additional 1 unit PRBC ordered and hb improved. She had low potassium that was replaced and will be  discharged once recheck is better.

## 2023-04-07 NOTE — [place] FL2 (Signed)
 [place]  MEDICAID FL2 LEVEL OF CARE FORM     IDENTIFICATION  Patient Name: Helen Richardson Birthdate: April 22, 1933 Sex: female Admission Date (Current Location): 03/28/2023  [facility] and [place] Number:  Producer, Television/film/video and Address:  [facility],  [address] [place], [place] [address]      Provider Number: [id number]  Attending Physician Name and Address:  Christobal Guadalajara, MD  Relative Name and Phone Number:  granddaughter Trude Cansler [phone]    Current Level of Care: Hospital Recommended Level of Care: Skilled Nursing Facility Prior Approval Number:    Date Approved/Denied:   PASRR Number: [id number]  Discharge Plan: SNF    Current Diagnoses: Patient Active Problem List   Diagnosis Date Noted   DNR (do not resuscitate) discussion 04/02/2023   Bacteremia 04/01/2023   Palliative care encounter 03/31/2023   Colonic mass 03/31/2023   Goals of care, counseling/discussion 03/31/2023   Counseling and coordination of care 03/31/2023   Need for emotional support 03/31/2023   Intraabdominal fluid collection 03/29/2023   Microcytic anemia 01/17/2023   SIRS (systemic inflammatory response syndrome) (HCC) 01/16/2023   Chronic heart failure with preserved ejection fraction (HFpEF) (HCC) 01/16/2023   Acute encephalopathy 01/16/2023   Intermediate stage nonexudative age-related macular degeneration of both eyes 07/04/2019   Posterior vitreous detachment of right eye 07/04/2019   Degenerative retinal drusen of both eyes 07/04/2019   Chorioretinal scar 07/04/2019   Pincer nail deformity 04/12/2019   CKD stage 3a, GFR 45-59 ml/min (HCC) 06/11/2018   Sepsis due to gram-negative UTI (HCC) 06/11/2018   Acute lower UTI (urinary tract infection) 06/10/2018   HTN (hypertension) 06/10/2018   Joint swelling 06/10/2018   Hyperkalemia 06/10/2018   Hyponatremia 06/10/2018   Normocytic anemia 06/10/2018   Type 2 diabetes mellitus with stage 3 chronic kidney disease, with  long-term current use of insulin  (HCC) 03/08/2018   Hypertensive nephropathy 03/08/2018   Pure hypercholesterolemia 03/08/2018   Class 1 obesity due to excess calories with serious comorbidity and body mass index (BMI) of 34.0 to 34.9 in adult 03/08/2018    Orientation RESPIRATION BLADDER Height & Weight     Self, Place  Normal Incontinent, External catheter (currently with purewick) Weight: 167 lb 12.3 oz (76.1 kg) Height:     BEHAVIORAL SYMPTOMS/MOOD NEUROLOGICAL BOWEL NUTRITION STATUS      Incontinent Diet (carb mod; thin liquids)  AMBULATORY STATUS COMMUNICATION OF NEEDS Skin   Extensive Assist Verbally Normal                       Personal Care Assistance Level of Assistance  Bathing, Dressing Bathing Assistance: Limited assistance   Dressing Assistance: Limited assistance     Functional Limitations Info  Sight, Hearing, Speech Sight Info: Adequate Hearing Info: Impaired Speech Info: Adequate    SPECIAL CARE FACTORS FREQUENCY  PT (By licensed PT), OT (By licensed OT)     PT Frequency: 5x/wk OT Frequency: 5x/wk            Contractures Contractures Info: Not present    Additional Factors Info  Code Status, Allergies, Insulin  Sliding Scale Code Status Info: DNR Allergies Info: Tradjenta    Insulin  Sliding Scale Info: see MAR       Current Medications (04/07/2023):  This is the current hospital active medication list Current Facility-Administered Medications  Medication Dose Route Frequency Provider Last Rate Last Admin   acetaminophen  (TYLENOL ) tablet 650 mg  650 mg Oral Q6H PRN Dena Charleston, MD  650 mg at 04/05/23 2216   Or   acetaminophen  (TYLENOL ) suppository 650 mg  650 mg Rectal Q6H PRN Dena Charleston, MD       Ampicillin -Sulbactam (UNASYN ) 3 g in sodium chloride  0.9 % 100 mL IVPB  3 g Intravenous Q6H Fleeta Rothman, Jomarie SAILOR, MD 200 mL/hr at 04/07/23 0953 3 g at 04/07/23 9046   enoxaparin  (LOVENOX ) injection 40 mg  40 mg Subcutaneous Q24H Allred,  Darrell K, PA-C   40 mg at 04/07/23 9045   fentaNYL  (SUBLIMAZE ) injection   Intravenous PRN Wagner, Jaime, DO   25 mcg at 03/31/23 1349   fentaNYL  (SUBLIMAZE ) injection   Intravenous PRN Wagner, Jaime, DO   25 mcg at 03/31/23 1401   insulin  aspart (novoLOG ) injection 0-9 Units  0-9 Units Subcutaneous TID WC Chavez, Abigail, NP   2 Units at 04/05/23 1640   lidocaine  (XYLOCAINE ) 1 % (with pres) injection    PRN Alona Corners, DO   10 mL at 03/31/23 1353   midazolam  (VERSED ) injection   Intravenous PRN Wagner, Jaime, DO   0.5 mg at 03/31/23 1349   ondansetron  (ZOFRAN ) tablet 4 mg  4 mg Oral Q6H PRN Dena Charleston, MD       Or   ondansetron  (ZOFRAN ) injection 4 mg  4 mg Intravenous Q6H PRN Dena Charleston, MD       oxyCODONE  (Oxy IR/ROXICODONE ) immediate release tablet 5 mg  5 mg Oral Q4H PRN Dena Charleston, MD   5 mg at 04/03/23 2003   pantoprazole  (PROTONIX ) EC tablet 40 mg  40 mg Oral BID Dorrell, Robert, MD   40 mg at 04/07/23 9045   sodium chloride  flush (NS) 0.9 % injection 5 mL  5 mL Intracatheter Q8H Alona Corners, DO   5 mL at 04/07/23 9473     Discharge Medications: Please see discharge summary for a list of discharge medications.  Relevant Imaging Results:  Relevant Lab Results:   Additional Information SSN: [id number]  NORMAN ASPEN, LCSW

## 2023-04-07 NOTE — Progress Notes (Signed)
 Occupational Therapy Treatment Patient Details Name: Helen Richardson MRN: [id number] DOB: 23-Apr-1933 Today's Date: 04/07/2023   History of present illness Pt is an 89 y/o F.  Patient presented to the ER via EMS 03/28/23 with hyperglycemia and c/o right flank pain. CT abdomen/pelvis 03/28/23:   1. Circumferential heterogeneous mass in the distal ascending colon  near the hepatic flexure measuring up to 7.1 cm. Findings are  worrisome for colon neoplasm.  2. Peripherally enhancing complex collection in the right perirenal  space lateral to the kidney measuring up to 6.1 cm. This abuts the  colonic mass. Findings may represent direct extension of  tumor/necrotic tumor, metastatic disease or abscess.  RUQ drain placed in IR on 03/31/23    PMH of acute encephalopathy  hypoglycemia, DM, CHF, HTN, high cholesterol   OT comments  Pt making slow progress with functional goals, however is limited by fatigue, dropping O2 SATs and increasing HR during activity. Grand daughters present this session. Pt on 4L O2 with 96% O2 SATs at rest, HR 72. Pt required max/mod A to it EOB, sit - stand/SPT to BSC min A +2. Pt required mod A for UB dressing, total A for all toileting tasks, CGA seated for grooming/hygiene using washcloth. Functional mobility form BSC to doorway using RW with chair follow.  Pt fatigues quickly. SATs dropped to 84% on 4L O2 and HR increased to 148.  Recliner pulled up to Pt from behind. Extended long rest break before SATs recovered to low 90's on 4LO2.  Pt was NOT on oxygen prior to admit. OT will continue to follow acutely to maximize level of function and safety      If plan is discharge home, recommend the following:  Supervision due to cognitive status;Assistance with cooking/housework;Assistance with feeding;Assist for transportation;A lot of help with bathing/dressing/bathroom;A lot of help with walking and/or transfers;Direct supervision/assist for medications management;Help with stairs or ramp  for entrance   Equipment Recommendations  Other (comment) (TBD at next venue of care)    Recommendations for Other Services      Precautions / Restrictions Precautions Precautions: Fall Precaution Comments: JP drain (R upper posterior back) monitor vitals Restrictions Weight Bearing Restrictions Per Provider Order: No       Mobility Bed Mobility Overal bed mobility: Needs Assistance Bed Mobility: Supine to Sit     Supine to sit: Mod assist, Max assist     General bed mobility comments: Required much asisst to transfer to EOB with increased assist to complete scooting to EOB.    Transfers Overall transfer level: Needs assistance   Transfers: Sit to/from Stand Sit to Stand: Min assist, +2 physical assistance, +2 safety/equipment           General transfer comment: first assisted from elevated bed to [facility] 1/4 turn NO AD such that pt was using B UE's to support self and transfer hands with assist to target completely.  Then assisted with sit to stand from [facility] VC's on proper hand placement.  Required asisst for peri care as pt was unable.  Pt fatigues quickly.  Required a seated rest break before attempting to amb.     Balance Overall balance assessment: Needs assistance Sitting-balance support: Feet supported, No upper extremity supported Sitting balance-[name]: Fair     Standing balance support: During functional activity, Reliant on assistive device for balance Standing balance-[name]: Poor  ADL either performed or assessed with clinical judgement   ADL Overall ADL's : Needs assistance/impaired     Grooming: Wash/dry hands;Wash/dry face;Contact guard assist;Sitting           Upper Body Dressing : Moderate assistance;Sitting   Lower Body Dressing: Total assistance Lower Body Dressing Details (indicate cue type and reason): to don brief Toilet Transfer: Minimal assistance;+2 for physical assistance;+2 for  safety/equipment;Stand-pivot;BSC/3in1;Cueing for safety   Toileting- Clothing Manipulation and Hygiene: Total assistance;Sit to/from stand       Functional mobility during ADLs: Minimal assistance;+2 for physical assistance;+2 for safety/equipment;Cueing for safety;Rolling walker (2 wheels) General ADL Comments: pt fatigues easily, required multiple ret beaks    Extremity/Trunk Assessment Upper Extremity Assessment Upper Extremity Assessment: Generalized weakness   Lower Extremity Assessment Lower Extremity Assessment: Defer to PT evaluation   Cervical / Trunk Assessment Cervical / Trunk Assessment: Other exceptions Cervical / Trunk Exceptions: Obesity    Vision Ability to See in Adequate Light: 0 Adequate Patient Visual Report: No change from baseline     Perception     Praxis      Cognition Arousal: Alert Behavior During Therapy: WFL for tasks assessed/performed Overall Cognitive Status: Within Functional Limits for tasks assessed Area of Impairment: Orientation, Attention, Following commands, Safety/judgement, Problem solving                 Orientation Level: Disoriented to, Situation, Time, Place   Memory: Decreased short-term memory Following Commands: Follows one step commands with increased time Safety/Judgement: Decreased awareness of safety, Decreased awareness of deficits   Problem Solving: Requires verbal cues, Requires tactile cues, Decreased initiation General Comments: AxO x 2 pleasant following repeat Instructions with increased time.  MAX c/o weakness/fatigue.        Exercises      Shoulder Instructions       General Comments      Pertinent Vitals/ Pain       Pain Assessment Pain Assessment: Faces Faces Pain Scale: Hurts a little bit Pain Location: drain site Pain Descriptors / Indicators: Tender Pain Intervention(s): Monitored during session, Repositioned  Home Living                                           Prior Functioning/Environment              Frequency  Min 1X/week        Progress Toward Goals  OT Goals(current goals can now be found in the care plan section)  Progress towards OT goals: Progressing toward goals     Plan      Co-evaluation    PT/OT/SLP Co-Evaluation/Treatment: Yes Reason for Co-Treatment: For patient/therapist safety;To address functional/ADL transfers   OT goals addressed during session: ADL's and self-care;Proper use of Adaptive equipment and DME      AM-PAC OT 6 Clicks Daily Activity     Outcome Measure   Help from another person eating meals?: A Lot Help from another person taking care of personal grooming?: A Little Help from another person toileting, which includes using toliet, bedpan, or urinal?: Total Help from another person bathing (including washing, rinsing, drying)?: A Lot Help from another person to put on and taking off regular upper body clothing?: A Lot Help from another person to put on and taking off regular lower body clothing?: Total 6 Click Score: 11    End of Session  Equipment Utilized During Treatment: Gait belt;Rolling walker (2 wheels);Other (comment);Oxygen (BSC)  OT Visit Diagnosis: Unsteadiness on feet (R26.81);Pain;Muscle weakness (generalized) (M62.81) Pain - Right/Left: Right Pain - part of body:  (drain site)   Activity Tolerance Patient limited by fatigue;Other (comment) (increasing HR)   Patient Left with call bell/phone within reach;with family/visitor present;in chair;with chair alarm set   Nurse Communication          Time: [phone] OT Time Calculation (min): 28 min  Charges: OT General Charges $OT Visit: 1 Visit OT Treatments $Self Care/Home Management : 8-22 mins    Jacques Karna Loose 04/07/2023, 2:39 PM

## 2023-04-07 NOTE — TOC Progression Note (Signed)
 Transition of Care [facility]) - Progression Note    Patient Details  Name: Helen Richardson MRN: [id number] Date of Birth: 08-13-1933  [facility] CM/SW Contact  NORMAN ASPEN, LCSW Phone Number: 04/07/2023, 2:21 PM  Clinical Narrative:     Have spoken with pt and granddaughter, [name] to update.  SNF bed search begun.  Confirmed again that they would like referral for community palliative to follow at SNF and have chosen [facility] - referral placed.   Expected Discharge Plan: Skilled Nursing Facility Barriers to Discharge: Continued Medical Work up, English As A Second Language Teacher, SNF Pending bed offer  Expected Discharge Plan and Services In-house Referral: Clinical Social Work   Post Acute Care Choice: Skilled Nursing Facility Living arrangements for the past 2 months: Single Family Home                 DME Arranged: N/A DME Agency: NA                   Social Determinants of Health (SDOH) Interventions SDOH Screenings   Food Insecurity: Patient Unable To Answer (03/30/2023)  Housing: Unknown (03/30/2023)  Transportation Needs: No Transportation Needs (03/30/2023)  Utilities: Not At Risk (03/30/2023)  Alcohol Screen: Low Risk  (10/18/2021)  Depression (PHQ2-9): Low Risk  (10/18/2021)  Financial Resource Strain: Low Risk  (10/18/2021)  Physical Activity: Inactive (10/18/2021)  Social Connections: Patient Unable To Answer (03/30/2023)  Stress: No Stress Concern Present (10/18/2021)  Tobacco Use: Low Risk  (03/29/2023)    Readmission Risk Interventions    04/06/2023    2:55 PM  Readmission Risk Prevention Plan  Transportation Screening Complete  PCP or Specialist Appt within 5-7 Days Complete  Home Care Screening Complete  Medication Review (RN CM) Complete

## 2023-04-07 NOTE — Progress Notes (Signed)
 Physical Therapy Treatment Patient Details Name: Helen Richardson MRN: [id number] DOB: 03-11-1933 Today's Date: 04/07/2023   History of Present Illness Pt is an 89 y/o F.  Patient presented to the ER via EMS 03/28/23 with hyperglycemia and c/o right flank pain. CT abdomen/pelvis 03/28/23:   1. Circumferential heterogeneous mass in the distal ascending colon  near the hepatic flexure measuring up to 7.1 cm. Findings are  worrisome for colon neoplasm.  2. Peripherally enhancing complex collection in the right perirenal  space lateral to the kidney measuring up to 6.1 cm. This abuts the  colonic mass. Findings may represent direct extension of  tumor/necrotic tumor, metastatic disease or abscess.  RUQ drain placed in IR on 03/31/23    PMH of acute encephalopathy  hypoglycemia, DM, CHF, HTN, high cholesterol    PT Comments  General Comments: AxO x 2 pleasant following repeat Instructions with increased time.  MAX c/o weakness/fatigue. Pt in bed on 4 lts sats at rest 96% with HR 72.  Granddaughter present.  Assisted OOB was difficult and required + 2 assist.  General bed mobility comments: Required much asisst to transfer to EOB with increased assist to complete scooting to EOB. General transfer comment: first assisted from elevated bed to [facility] 1/4 turn NO AD such that pt was using B UE's to support self and transfer hands with assist to target completely.  Then assisted with sit to stand from [facility] VC's on proper hand placement.  Required asisst for peri care as pt was unable.  Pt fatigues quickly.  Required a seated rest break before attempting to amb. General Gait Details: VERY limited amb distance barely 3 feet + 2 assist due to MAX c/o weakness/fatigue.  Sats decreased to 84% on 4 lts and HR increased to 148.  Recliner pulled up to Pt from behind. Extended long rest break before sats recovered to low 90's still on 4 lts.  Pt was NOT on oxygen prior to admit. Pt will need ST Rehab at SNF to address mobility and  functional decline prior to safely returning home.     If plan is discharge home, recommend the following: A lot of help with bathing/dressing/bathroom;A lot of help with walking and/or transfers   Can travel by private vehicle     No  Equipment Recommendations  None recommended by PT    Recommendations for Other Services       Precautions / Restrictions Precautions Precautions: Fall Precaution Comments: JP drain (R upper posterior back) monitor vitals Restrictions Weight Bearing Restrictions Per Provider Order: No     Mobility  Bed Mobility Overal bed mobility: Needs Assistance Bed Mobility: Supine to Sit     Supine to sit: Mod assist, Max assist     General bed mobility comments: Required much asisst to transfer to EOB with increased assist to complete scooting to EOB.    Transfers Overall transfer level: Needs assistance Equipment used: Rolling walker (2 wheels) Transfers: Sit to/from Stand Sit to Stand: Min assist, +2 physical assistance, +2 safety/equipment           General transfer comment: first assisted from elevated bed to [facility] 1/4 turn NO AD such that pt was using B UE's to support self and transfer hands with assist to target completely.  Then assisted with sit to stand from [facility] VC's on proper hand placement.  Required asisst for peri care as pt was unable.  Pt fatigues quickly.  Required a seated rest break before attempting to amb.  Ambulation/Gait Ambulation/Gait assistance: Min assist, Mod assist Gait Distance (Feet): 3 Feet Assistive device: Rolling walker (2 wheels) Gait Pattern/deviations: Step-through pattern, Decreased step length - left, Decreased step length - right Gait velocity: decreased     General Gait Details: VERY limited amb distance barely 3 feet + 2 assist due to MAX c/o weakness/fatigue.  Sats decreased to 84% on 4 lts and HR increased to 148.  Recliner pulled up to Pt from behind.   Stairs             Wheelchair  Mobility     Tilt Bed    Modified Rankin (Stroke Patients Only)       Balance                                            Cognition Arousal: Alert Behavior During Therapy: WFL for tasks assessed/performed Overall Cognitive Status: Within Functional Limits for tasks assessed Area of Impairment: Orientation, Attention, Following commands, Safety/judgement, Problem solving                 Orientation Level: Disoriented to, Situation, Time, Place Current Attention Level: Focused Memory: Decreased short-term memory Following Commands: Follows one step commands with increased time Safety/Judgement: Decreased awareness of safety, Decreased awareness of deficits Awareness: Intellectual Problem Solving: Requires verbal cues, Requires tactile cues, Decreased initiation General Comments: AxO x 2 pleasant following repeat Instructions with increased time.  MAX c/o weakness/fatigue.        Exercises      General Comments        Pertinent Vitals/Pain Pain Assessment Pain Assessment: Faces Faces Pain Scale: Hurts a little bit Pain Location: drain site Pain Descriptors / Indicators: Tender Pain Intervention(s): Monitored during session    Home Living                          Prior Function            PT Goals (current goals can now be found in the care plan section) Progress towards PT goals: Progressing toward goals    Frequency    Min 1X/week      PT Plan      Co-evaluation              AM-PAC PT 6 Clicks Mobility   Outcome Measure  Help needed turning from your back to your side while in a flat bed without using bedrails?: A Lot Help needed moving from lying on your back to sitting on the side of a flat bed without using bedrails?: A Lot Help needed moving to and from a bed to a chair (including a wheelchair)?: A Lot Help needed standing up from a chair using your arms (e.g., wheelchair or bedside chair)?: A  Lot Help needed to walk in hospital room?: A Lot Help needed climbing 3-5 steps with a railing? : Total 6 Click Score: 11    End of Session Equipment Utilized During Treatment: Gait belt Activity Tolerance: Patient limited by fatigue Patient left: in chair;with call bell/phone within reach;with family/visitor present Nurse Communication: Mobility status PT Visit Diagnosis: Unsteadiness on feet (R26.81);Other abnormalities of gait and mobility (R26.89)     Time: 8874-8847 PT Time Calculation (min) (ACUTE ONLY): 27 min  Charges:    $Gait Training: 8-22 mins $Therapeutic Activity: 8-22 mins PT General Charges $$ ACUTE PT  VISIT: 1 Visit                     Katheryn Leap  PTA Acute  Rehabilitation Services Office M-F          [phone]

## 2023-04-08 DIAGNOSIS — R188 Other ascites: Secondary | ICD-10-CM | POA: Diagnosis not present

## 2023-04-08 LAB — GLUCOSE, CAPILLARY
Glucose-Capillary: 84 mg/dL (ref 70–99)
Glucose-Capillary: 116 mg/dL — ABNORMAL HIGH (ref 70–99)
Glucose-Capillary: 190 mg/dL — ABNORMAL HIGH (ref 70–99)
Glucose-Capillary: 99 mg/dL (ref 70–99)

## 2023-04-08 MED ORDER — AMOXICILLIN-POT CLAVULANATE 875-125 MG PO TABS: 1.0000 | ORAL_TABLET | Freq: Two times a day (BID) | ORAL | Status: DC

## 2023-04-08 NOTE — Progress Notes (Signed)
SlP swallow evaluation completed - full report to follow.  Donavan Burnet, [facility] SLP .

## 2023-04-08 NOTE — Plan of Care (Signed)
  Problem: Skin Integrity: Goal: Risk for impaired skin integrity will decrease Outcome: Progressing   

## 2023-04-08 NOTE — Consult Note (Signed)
 WOC Nurse Consult Note: Reason for Consult: area of concern on buttock during prevalence data collection Wound type: dry skin; abrasion documented on buttock at the time of admission as well Pressure Injury POA:NA Measurement:NA Wound bed: NA Drainage (amount, consistency, odor) none Periwound: intact  Dressing procedure/placement/frequency: Prophylactic foam in place Turn and reposition per hospital policy   Re consult if needed, will not follow at this time. Thanks  Chenita Ruda [employer], RN,CWOCN, CNS, CWON-AP [phone])

## 2023-04-08 NOTE — Evaluation (Signed)
 Clinical/Bedside Swallow Evaluation Patient Details  Name: Helen Richardson MRN: [id number] Date of Birth: 02-05-1934  Today's Date: 04/08/2023 Time: SLP Start Time (ACUTE ONLY): 1438 SLP Stop Time (ACUTE ONLY): 1515 SLP Time Calculation (min) (ACUTE ONLY): 37 min  Past Medical History:  Past Medical History:  Diagnosis Date   CHF (congestive heart failure) (HCC)    Diabetes mellitus without complication (HCC)    High cholesterol    HTN (hypertension)    Obesity    Past Surgical History:  Past Surgical History:  Procedure Laterality Date   CATARACT EXTRACTION, BILATERAL     ingrown toenail Left    left great toenail   IR CATHETER TUBE CHANGE  04/06/2023   PARTIAL HYSTERECTOMY     HPI:  Patient is an 89 year old female admitted to [facility] long hospital with right flank pain on 125.  Found to have a colonic mass.  At this time family patient determined not to pursue biopsy of colonic mass.   Past medical history significant for dementia, GERD, diabetes mellitus.  She is status post drainage catheter placed for intra-abdominal fluid and also notably had a right pleural effusion right middle lobe and lower lobe consolidation questionable atelectasis versus pneumonia.  Swallow evaluation ordered by hospitalist.  She is currently on a carb diet.  Plans are for her to discharge to skilled nursing facility for rehab with follow-up palliative care.  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  Patient ended up with leukocytosis and has had varying intake and blood sugar issues. Per notes, IR planning to reposition her drain that was placed.  Swallow evaluation ordered. .    Assessment / Plan / Recommendation  Clinical Impression  Patient demonstrates cognitive based dysphagia mostly characterized by prolonged discoordinated manipulation of solids.  Excessive lingual thrusting and oral  movement noted in absence of p.o. intake of which patient is unaware.  Very limited evaluation done as patient would only accept a few sips and bites,  politely declining any more.  She does appear with lingual fasciculations with tongue protrusion, question source.  Providing pure consistency to aid in oral transit of masticated solids was helpful for compensation.  SLP called [name] granddaughter of patient he reports patient has been having this excessive lingual movement at baseline.  She also endorses patient's been having some issues with swallowing in the last month with prolonged mastication and and inadequacy of intake.  Myles [name] to compensation strategies to mitigate patient's dysphagia.  Messaged dietitian and requested extra gravies and sauces and pured meats be provided for patient as after speaking to RN, she reports patient has been coughing with solids.  Suspect her coughing with solids may be due to premature spillage into larynx.  Given these findings and very limited assessment as well as chest findings on result of imaging will follow-up to assure compensation strategies are effective for patient. SLP Visit Diagnosis: Dysphagia, oropharyngeal phase (R13.12)    Aspiration Risk  Mild aspiration risk;Other (comment) (With use of compensation strategies)    Diet Recommendation Regular;Dysphagia 3 (Mech soft) (Order softer foods)    Liquid Administration via: Straw;Cup Medication Administration: Other (Comment) (As tolerated but may want to attempt with slightly thicker liquids or applesauce, ice cream etc.) Supervision: Full supervision/cueing for compensatory strategies Compensations: Slow rate;Small sips/bites;Other (Comment) (Use pure to help aid oral transiting of solids patient has masticated) Postural Changes: Seated upright at 90 degrees;Remain  upright for at least 30 minutes after po intake    Other  Recommendations Oral Care Recommendations: Oral care BID     Recommendations for follow up therapy are one component of a multi-disciplinary discharge planning process, led by the attending physician.  Recommendations may be updated based on patient status, additional functional criteria and insurance authorization.  Follow up Recommendations        Assistance Recommended at Discharge    Functional Status Assessment    Frequency and Duration min 1 x/week  1 week       Prognosis Prognosis for improved oropharyngeal function: Fair Barriers to Reach Goals: Cognitive deficits;Other (Comment)      Swallow Study   General HPI: Patient is an 89 year old female admitted to [facility] long hospital with right flank pain on 125.  Found to have a colonic mass.  At this time family patient determined not to pursue biopsy of colonic mass.   Past medical history significant for dementia, GERD, diabetes mellitus.  She is status post drainage catheter placed for intra-abdominal fluid and also notably had a right pleural effusion right middle lobe and lower lobe consolidation questionable atelectasis versus pneumonia.  Swallow evaluation ordered by hospitalist.  She is currently on a carb diet.  Plans are for her to discharge to skilled nursing facility for rehab with follow-up palliative care.  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  Patient ended up with leukocytosis and has had varying intake and blood sugar issues. Per notes, IR planning to reposition her drain that was placed.  Swallow evaluation ordered. . Type of Study: Bedside Swallow Evaluation Previous Swallow Assessment: n/a Diet Prior to this Study: Regular;Thin liquids (Level 0) Temperature Spikes Noted: No Respiratory Status: Room air History of Recent Intubation: No Behavior/Cognition: Alert;Cooperative;Pleasant mood Oral Cavity Assessment: Within Functional Limits Oral Care  Completed by SLP: No Oral Cavity - Dentition: Other (Comment) (some dentition missing) Self-Feeding Abilities: Other (Comment) (pt did not attempt to feed self - - and sign on board indicates that she needs fed) Patient Positioning: Upright in bed Baseline Vocal Quality: Normal Volitional Cough: Congested Volitional Swallow: Able to elicit    Oral/Motor/Sensory Function Overall Oral Motor/Sensory Function: Other (comment) (extra lingual and oral movement including lingual protrusion and jaw opening and mouth)   Ice Chips Ice chips: Not tested   Thin Liquid Thin Liquid: Impaired Presentation: Straw Oral Phase Impairments: Reduced lingual movement/coordination;Reduced labial seal Oral Phase Functional Implications: Other (comment) (open mouth posture) Pharyngeal  Phase Impairments: Suspected delayed Swallow Other Comments: Open mouth swallow with the last swallow of liquids when consuming sequentially    Nectar Thick Nectar Thick Liquid: Not tested   Honey Thick Honey Thick Liquid: Not tested   Puree Puree: Within functional limits Presentation: Self Fed;Spoon   Solid     Solid: Impaired Oral Phase Impairments: Impaired mastication;Reduced lingual movement/coordination;Poor awareness of bolus;Reduced labial seal Oral Phase Functional Implications: Prolonged oral transit;Impaired mastication;Other (comment) Pharyngeal Phase Impairments: Suspected delayed Swallow Other Comments: use of puree helpful to orally transit      [name] 04/08/2023,4:50 PM  [name], MS [facility] SLP Acute Rehab Services Office [phone]

## 2023-04-08 NOTE — Progress Notes (Signed)
 PROGRESS NOTE ADELYNA BROCKMAN  FMW:[id number] DOB: [birth year] DOA: 03/28/2023 PCP: System, Provider Not In  Brief Narrative/Hospital Course: 89 yr old woman who presented to [facility] ED on 03/28/2023 with complaints of right sided flank pain. The patient had had a previous admission in November 2024 for AMS after being found unresponsive. She was found to be hypoglycemic on that occasion. She gradually improved, and was discharged to outpatient follow-up. The patient states that she developed right sided flank pain and high glucoses in the past few days. Upon presentation her glucose was 629.She also had a WBC of 18, Hgb 8.8. and sodium 125. CT abdomen and pelvis which demonstrated a circumferential mass in the distal ascending colon near the hepatic flexure measuring 7 cm. There is also a enhancing complex collection in the right perirenal space measuring 6 cm with concern for tumor or metastatic disease. IR was consulted in the ED for possible drain placement versus biopsy, AND admitted. The patient has been evaluated by IR. As the patient herself is not capable of expressing herself, following commands, or making her own decisions. Her son was at bedside and drain was placed 1/28. Palliative care has been consulted.  Subsequently, milligram by worsening leukocytosis then proceeded to decline. On 04/02/2023 Palliative care had a family meeting: Family decided not to pursue a biopsy, wished for the patient to work with PT and perhaps go on to rehab. They might want to revisit biopsy in the future. ID was consulted and felt she is not appropriate for long term antibiotics.They feel that she is better suited for oral antibiotics.  2/1 had a discussion with the patient's sons regarding her diagnosis, prognosis, and her code status. She is having pain from the drain site. The drain will not be coming out unless she undergoes surgery for the colon mass. This will be a very big surgery and a difficult one for her to  recover from. She is quite weak and debilitated as it is. The alternative would be to make the patient comfort care only.  The sons report that the patient has actually signed a document stating that she does not want any artificial measures to extend her life, family encouraged to discuss about CODE STATUS. 2/2: Elevated CBG, her D10 - discontinued.Family is still not very decisive about taking her back home with hospice and also does not want any extensive procedure. Prior drain cultures with pansensitive E. coli, Streptococcus Anginosis and bacteroid> strep angionsus is sensitive to penicillin, okay to keep Unasyn  and changed to Augmentin  upon discharge per ID. 2/3: episode of hypoglycemia as p.o. intake remained poor- dscontinued Semglee , IR plans for repositioning  drain. Palliative care had further meeting and plan was discharged home with hospice care and family were agreeable.  But subsequently family informed that they do not have 24/7 caregiver at home so unable to go home with hospice and they want to pursue skilled nursing facility and have palliative care follow-up.  With her bacteremia and intra-abdominal fluid collection abscess plan is to discharge on oral Augmentin .    Subjective: Patient seen examined this morning  Bit lethargic today no new complaints  Overnight afebrile blood pressure stable 130s to 160s  Assessment and Plan: Principal Problem:   Intraabdominal fluid collection Active Problems:   Palliative care encounter   Colonic mass   Goals of care, counseling/discussion   Counseling and coordination of care   Need for emotional support   Bacteremia   DNR (do not resuscitate) discussion  Intra-abdominal fluid collection-abscess Colonic Mass Flank pain likely from malignancy/mass: Intra-abdominal fluid collection/abscess in the setting of colon mass, not a candidate for surgery subsequently underwent IR placed drainage and drain fluid with E. coli Streptococcus and  bacteroids, also bacteremic.  Seen by ID, continue on IV antibiotics Unasyn  while here> on discharge will do Augmentin  x 30 days discussed with ID today.  Concerned about likely metastatic disease or locally advanced colon cancer no prior colonoscopy palliative care has seen the patient recommending discharge home with hospice-no plan for further biopsy after discussion with the family.  Continue pain control  Bacteremia with E faecalis-likely intra-abdominal source with abscess Leukocytosis: Prior drain cultures with pansensitive E. coli, Streptococcus Anginosis and bacteroid> strep angionsus is sensitive to penicillin, okay to keep Unasyn  and to change to Augmentin  upon discharge per ID x 30 days.Repeat blood cultures have been negative, echocardiogram was suboptimal but did not show any evidence of endocarditis per ID she would not be appropriate for long-term IV antibiotics.  Type 2 diabetes mellitus with uncontrolled Hyperglycemia  Episodes of hypoglycemia due to poor oral intake: Blood sugar remains well-controlled, no hypoglycemia.  Off long-acting insulin  on SSI 0 to 9 units, encourage oral intake Recent Labs  Lab 04/07/23 0723 04/07/23 1127 04/07/23 1635 04/07/23 2143 04/08/23 0746  GLUCAP 81 99 188* 127* 84    Dementia Debilitated/deconditioning: Fairly alert awake at time lethargic, continue supportive care continue DNR cont delirium precaution  CKD stage IIIa: creatinine 1.2 with poor oral intake, varying from 0.7-1.2.  Hyponatremia mild: Likely with poor oral intake and?  SIADH  Anemia of chronic disease likely from malignancy: Hemoglobin holding around 7.5 g.b/l hb around 7.7-8.5gm.  Monitor intermittently  Essential hypertension: BP well-controlled without meds  GERD: On PPI  Goals of care: Patient has been changed to DNR limited, extensive meeting were held with palliative care and the family in the light of her colon mass likely malignancy with dementia  deconditioning debility, initial plan was discharged home with hospice but at this time family unable to provide 24/7 care at home and they would like to proceed with patient going into rehab with palliative care following as outpatient   DVT prophylaxis: enoxaparin  (LOVENOX ) injection 40 mg Start: 04/01/23 1000 SCDs Start: 03/29/23 0034 Code Status:   Code Status: Limited: Do not attempt resuscitation (DNR) -DNR-LIMITED -Do Not Intubate/DNI  Family Communication: plan of care discussed with patient/none at bedside. Patient status is: Remains hospitalized because of severity of illness Level of care: Med-Surg   Dispo: The patient is from: home            Anticipated disposition: Awaiting skilled facility.    Objective: Vitals last 24 hrs: Vitals:   04/07/23 0500 04/07/23 1247 04/07/23 2140 04/08/23 0610  BP: 125/67 136/77 (!) 167/78 (!) 130/52  Pulse: 99 71 (!) 105 (!) 107  Resp: (!) 22 17 16 18   Temp: 98.7 F (37.1 C) 97.8 F (36.6 C) 98.7 F (37.1 C) 98.8 F (37.1 C)  TempSrc: Oral Oral Oral Oral  SpO2: 100% 100% 96% 98%  Weight:       Weight change:   Physical Examination: General exam: alert awake, mild lethargic but able to wake up and answer questions HEENT:Oral mucosa moist, Ear/Nose WNL grossly Respiratory system: Bilaterally clear BS,no use of accessory muscle Cardiovascular system: S1 & S2 +, No JVD. Gastrointestinal system: Abdomen soft,NT,ND, BS+, drain in place. Nervous System: Alert, awake, moving all extremities,and following commands. Extremities: LE edema neg,distal peripheral pulses  palpable and warm.  Skin: No rashes,no icterus. MSK: Normal muscle bulk,tone, power   Medications reviewed:  Scheduled Meds:  enoxaparin  (LOVENOX ) injection  40 mg Subcutaneous Q24H   insulin  aspart  0-9 Units Subcutaneous TID WC   pantoprazole   40 mg Oral BID   sodium chloride  flush  5 mL Intracatheter Q8H  Continuous Infusions:  ampicillin -sulbactam (UNASYN ) IV 3 g  (04/08/23 1007)    Diet Order             Diet Carb Modified Fluid consistency: Thin; Room service appropriate? Yes  Diet effective now                  Intake/Output Summary (Last 24 hours) at 04/08/2023 1150 Last data filed at 04/08/2023 0900 Gross per 24 hour  Intake 1024.06 ml  Output 450 ml  Net 574.06 ml   Net IO Since Admission: 9,677.69 mL [04/08/23 1150]  Wt Readings from Last 3 Encounters:  03/28/23 76.1 kg  01/17/23 76.1 kg  11/19/22 79.8 kg     Unresulted Labs (From admission, onward)    None     Data Reviewed: I have personally reviewed following labs and imaging studies CBC: Recent Labs  Lab 04/02/23 0455 04/04/23 0533 04/05/23 0512  WBC 17.5* 26.8* 20.4*  NEUTROABS 13.3* 21.0* 15.9*  HGB 7.9* 7.4* 7.5*  HCT 25.4* 23.2* 23.4*  MCV 77.0* 75.6* 74.1*  PLT 386 372 PLATELET CLUMPS NOTED ON SMEAR, COUNT APPEARS ADEQUATE   Basic Metabolic Panel:  Recent Labs  Lab 04/02/23 0455 04/04/23 0533 04/05/23 0512  NA 136 134* 132*  K 3.6 3.8 3.9  CL 103 102 102  CO2 24 23 21*  GLUCOSE 63* 148* 219*  BUN 10 10 9   CREATININE 1.00 1.28* 1.20*  CALCIUM  8.0* 7.5* 7.5*   GFR: Estimated Creatinine Clearance: 33.8 mL/min (A) (by C-G formula based on SCr of 1.2 mg/dL (H)). Liver Function Tests:  Recent Labs  Lab 04/07/23 0723 04/07/23 1127 04/07/23 1635 04/07/23 2143 04/08/23 0746  GLUCAP 81 99 188* 127* 84   No results for input(s): CHOL, HDL, LDLCALC, TRIG, CHOLHDL, LDLDIRECT in the last 72 hours. No results for input(s): TSH, T4TOTAL, FREET4, T3FREE, THYROIDAB in the last 72 hours. Sepsis Labs: No results for input(s): PROCALCITON, LATICACIDVEN in the last 168 hours. Recent Results (from the past 240 hours)  Culture, blood (Routine X 2) w Reflex to ID Panel     Status: None   Collection Time: 03/31/23  9:37 AM   Specimen: BLOOD  Result Value Ref Range Status   Specimen Description   Final    BLOOD BLOOD LEFT  HAND Performed at [facility], [address], [place], [place] [address]    Special Requests   Final    BOTTLES DRAWN AEROBIC ONLY Blood Culture results may not be optimal due to an inadequate volume of blood received in culture bottles Performed at [facility], [address] [place], [place] [address]    Culture   Final    NO GROWTH 5 DAYS Performed at [facility], [address] [place], [place] [address]    Report Status 04/05/2023 FINAL  Final  Culture, blood (Routine X 2) w Reflex to ID Panel     Status: None   Collection Time: 03/31/23  9:42 AM   Specimen: BLOOD  Result Value Ref Range Status   Specimen Description   Final    BLOOD BLOOD RIGHT HAND Performed at [facility], [address] [place], [place] [address]    Special Requests   Final    BOTTLES DRAWN AEROBIC ONLY Blood Culture results may not be optimal due to an inadequate volume of blood received in culture bottles Performed at [facility], [address] [place], [place] [address]    Culture   Final    NO GROWTH 5 DAYS Performed at [facility], [address], [place], [place] [address]    Report Status 04/05/2023 FINAL  Final  Aerobic/Anaerobic Culture w Gram Stain (surgical/deep wound)     Status: None   Collection Time: 03/31/23  4:42 PM   Specimen: Abdomen; Abscess  Result Value Ref Range Status   Specimen Description   Final    ABDOMEN Performed at [facility], [address], [place], [place] [address]    Special Requests   Final    NONE Performed at [facility], [address], [place], [place] [address]    Gram Stain   Final    ABUNDANT WBC PRESENT,BOTH PMN AND MONONUCLEAR ABUNDANT GRAM POSITIVE COCCI    Culture   Final    ABUNDANT ESCHERICHIA COLI ABUNDANT STREPTOCOCCUS ANGINOSIS ABUNDANT BACTEROIDES THETAIOTAOMICRON BETA LACTAMASE POSITIVE Performed at  [facility], [address] [place], [place] [address]    Report Status 04/05/2023 FINAL  Final   Organism ID, Bacteria ESCHERICHIA COLI  Final   Organism ID, Bacteria STREPTOCOCCUS ANGINOSIS  Final      Susceptibility   Escherichia coli - MIC*    AMPICILLIN  <=2 SENSITIVE Sensitive     CEFEPIME <=0.12 SENSITIVE Sensitive     CEFTAZIDIME <=1 SENSITIVE Sensitive     CEFTRIAXONE  <=0.25 SENSITIVE Sensitive     CIPROFLOXACIN <=0.25 SENSITIVE Sensitive     GENTAMICIN <=1 SENSITIVE Sensitive     IMIPENEM <=0.25 SENSITIVE Sensitive     TRIMETH/SULFA <=20 SENSITIVE Sensitive     AMPICILLIN /SULBACTAM <=2 SENSITIVE Sensitive     PIP/TAZO <=4 SENSITIVE Sensitive ug/mL    * ABUNDANT ESCHERICHIA COLI   Streptococcus anginosis - MIC*    PENICILLIN <=0.06 SENSITIVE Sensitive     CEFTRIAXONE  <=0.12 SENSITIVE Sensitive     ERYTHROMYCIN <=0.12 SENSITIVE Sensitive     LEVOFLOXACIN 0.5 SENSITIVE Sensitive     VANCOMYCIN  0.5 SENSITIVE Sensitive     * ABUNDANT STREPTOCOCCUS ANGINOSIS  Culture, blood (Routine X 2) w Reflex to ID Panel     Status: None   Collection Time: 03/31/23  5:22 PM   Specimen: BLOOD  Result Value Ref Range Status   Specimen Description   Final    BLOOD BLOOD RIGHT HAND Performed at [facility], [address] [place], [place] [address]    Special Requests   Final    BOTTLES DRAWN AEROBIC ONLY Blood Culture results may not be optimal due to an inadequate volume of blood received in culture bottles Performed at [facility], [address] [place], [place] [address]    Culture   Final    NO GROWTH 5 DAYS Performed at [facility], [address] [place], [place] [address]    Report Status 04/05/2023 FINAL  Final  Culture, blood (Routine X 2) w Reflex to ID Panel     Status: None   Collection Time: 03/31/23  5:34 PM   Specimen: BLOOD  Result Value Ref Range Status   Specimen Description   Final    BLOOD  BLOOD LEFT  HAND Performed at [facility], [address] [place], [place] [address]    Special Requests   Final    BOTTLES DRAWN AEROBIC ONLY Blood Culture results may not be optimal due to an inadequate volume of blood received in culture bottles Performed at [facility], [address], [place], [place] [address]    Culture   Final    NO GROWTH 5 DAYS Performed at [facility], [address], [place], [place] [address]    Report Status 04/05/2023 FINAL  Final    Antimicrobials/Microbiology: Anti-infectives (From admission, onward)    Start     Dose/Rate Route Frequency Ordered Stop   03/31/23 1600  Ampicillin -Sulbactam (UNASYN ) 3 g in sodium chloride  0.9 % 100 mL IVPB        3 g 200 mL/hr over 30 Minutes Intravenous Every 6 hours 03/31/23 1451     03/30/23 2300  ampicillin  (OMNIPEN) 2 g in sodium chloride  0.9 % 100 mL IVPB  Status:  Discontinued        2 g 300 mL/hr over 20 Minutes Intravenous Every 6 hours 03/30/23 2204 03/31/23 1451   03/29/23 1000  ceFEPIme (MAXIPIME) 2 g in sodium chloride  0.9 % 100 mL IVPB  Status:  Discontinued        2 g 200 mL/hr over 30 Minutes Intravenous Every 12 hours 03/28/23 2348 03/29/23 0040   03/29/23 0000  vancomycin  (VANCOCIN ) IVPB 1000 mg/200 mL premix  Status:  Discontinued        1,000 mg 200 mL/hr over 60 Minutes Intravenous  Once 03/28/23 2348 03/29/23 0040   03/28/23 2215  cefTRIAXone  (ROCEPHIN ) 2 g in sodium chloride  0.9 % 100 mL IVPB        2 g 200 mL/hr over 30 Minutes Intravenous  Once 03/28/23 2203 03/29/23 0002         Component Value Date/Time   SDES  03/31/2023 1734    BLOOD BLOOD LEFT HAND Performed at [facility], [address], [place], [place] [address]    SPECREQUEST  03/31/2023 1734    BOTTLES DRAWN AEROBIC ONLY Blood Culture results may not be optimal due to an inadequate volume of blood received in culture bottles Performed at [facility],  [address], [place], [place] [address]    CULT  03/31/2023 1734    NO GROWTH 5 DAYS Performed at [facility], [address], [place], [place] [address]    REPTSTATUS 04/05/2023 FINAL 03/31/2023 1734     Radiology Studies: IR Catheter Tube Change Result Date: 04/06/2023 INDICATION: Right-sided colon mass, retroperitoneal abscess, status post percutaneous drain catheter placement 03/31/2023. Follow-up CT showed retraction of the catheter, proximal side hole nearly at the skin surface. Continued output from catheter 25 cc per day. Catheter revision requested before discharge EXAM: EXCHANGE OF ABSCESS DRAIN CATHETER UNDER FLUOROSCOPY MEDICATIONS: The patient is currently admitted to the hospital and receiving intravenous antibiotics. The antibiotics were administered within an appropriate time frame prior to the initiation of the procedure. ANESTHESIA/SEDATION: 1% lidocaine  subcutaneous COMPLICATIONS: None immediate. PROCEDURE: Informed written consent was obtained from the patient after a thorough discussion of the procedural risks, benefits and alternatives. All questions were addressed. Maximal Sterile Barrier Technique was utilized including caps, mask, sterile gowns, sterile gloves, sterile drape, hand hygiene and skin antiseptic. A timeout was performed prior to the initiation of the procedure. Catheter and surrounding skin prepped with Betadine, draped in  usual sterile fashion, infiltrated locally with 1% lidocaine . Small contrast injection under fluoroscopy demonstrated flow to the residual retroperitoneal abscess cavity. No fistula to bowel demonstrated. The catheter was cut and exchanged over an Amplatz wire for a new 10 French pigtail drain catheter, forms within the deep portion of the residual retroperitoneal collection. Catheter position confirmed on fluoroscopy. Catheter was flushed, secured externally with 0 Prolene suture and StatLock and placed to gravity drain bag. The patient  tolerated the procedure well. IMPRESSION: 1. Technically successful exchange and revision of 10 French right retroperitoneal pigtail abscess drain catheter under fluoroscopy. Electronically Signed   By: JONETTA Faes M.D.   On: 04/06/2023 17:14     LOS: 10 days   Total time spent in review of labs and imaging, patient evaluation, formulation of plan, documentation and communication with family: 35 minutes  Mennie LAMY, MD  [facility]  04/08/2023, 11:50 AM

## 2023-04-08 NOTE — TOC Progression Note (Addendum)
 Transition of Care [facility]) - Progression Note    Patient Details  Name: Helen Richardson MRN: [id number] Date of Birth: [birth year]  [facility] CM/SW Contact  Luann SHAUNNA Cumming, LCSW Phone Number: 04/08/2023, 4:08 PM  Clinical Narrative:     CSW called pt's grandaughter and provided SNF bed offers. She will review bed offers. TOC to follow for SNF choice.   1615: Received call back from grandaughter, she chooses [facility].  CSW awaiting bed offer confirmation from [facility].   Expected Discharge Plan: Skilled Nursing Facility Barriers to Discharge: Continued Medical Work up, English As A Second Language Teacher, SNF Pending bed offer  Expected Discharge Plan and Services In-house Referral: Clinical Social Work   Post Acute Care Choice: Skilled Nursing Facility Living arrangements for the past 2 months: Single Family Home                 DME Arranged: N/A DME Agency: NA                   Social Determinants of Health (SDOH) Interventions SDOH Screenings   Food Insecurity: Patient Unable To Answer (03/30/2023)  Housing: Unknown (03/30/2023)  Transportation Needs: No Transportation Needs (03/30/2023)  Utilities: Not At Risk (03/30/2023)  Alcohol Screen: Low Risk  (10/18/2021)  Depression (PHQ2-9): Low Risk  (10/18/2021)  Financial Resource Strain: Low Risk  (10/18/2021)  Physical Activity: Inactive (10/18/2021)  Social Connections: Patient Unable To Answer (03/30/2023)  Stress: No Stress Concern Present (10/18/2021)  Tobacco Use: Low Risk  (03/29/2023)    Readmission Risk Interventions    04/06/2023    2:55 PM  Readmission Risk Prevention Plan  Transportation Screening Complete  PCP or Specialist Appt within 5-7 Days Complete  Home Care Screening Complete  Medication Review (RN CM) Complete

## 2023-04-09 ENCOUNTER — Inpatient Hospital Stay (HOSPITAL_COMMUNITY): Payer: Medicare PPO

## 2023-04-09 DIAGNOSIS — R188 Other ascites: Secondary | ICD-10-CM | POA: Diagnosis not present

## 2023-04-09 LAB — CBC
HCT: 26.2 % — ABNORMAL LOW (ref 36.0–46.0)
Hemoglobin: 7.9 g/dL — ABNORMAL LOW (ref 12.0–15.0)
MCH: 23.2 pg — ABNORMAL LOW (ref 26.0–34.0)
MCHC: 30.2 g/dL (ref 30.0–36.0)
MCV: 77.1 fL — ABNORMAL LOW (ref 80.0–100.0)
Platelets: 528 10*3/uL — ABNORMAL HIGH (ref 150–400)
RBC: 3.4 MIL/uL — ABNORMAL LOW (ref 3.87–5.11)
RDW: 20.2 % — ABNORMAL HIGH (ref 11.5–15.5)
WBC: 10.3 10*3/uL (ref 4.0–10.5)
nRBC: 0 % (ref 0.0–0.2)

## 2023-04-09 LAB — GLUCOSE, CAPILLARY
Glucose-Capillary: 137 mg/dL — ABNORMAL HIGH (ref 70–99)
Glucose-Capillary: 91 mg/dL (ref 70–99)
Glucose-Capillary: 89 mg/dL (ref 70–99)

## 2023-04-09 LAB — BASIC METABOLIC PANEL
Anion gap: 11 (ref 5–15)
BUN: <5 mg/dL — ABNORMAL LOW (ref 8–23)
CO2: 23 mmol/L (ref 22–32)
Calcium: 7.3 mg/dL — ABNORMAL LOW (ref 8.9–10.3)
Chloride: 104 mmol/L (ref 98–111)
Creatinine, Ser: 0.92 mg/dL (ref 0.44–1.00)
GFR, Estimated: 60 mL/min — ABNORMAL LOW (ref >60)
Glucose, Bld: 155 mg/dL — ABNORMAL HIGH (ref 70–99)
Potassium: 3.3 mmol/L — ABNORMAL LOW (ref 3.5–5.1)
Sodium: 138 mmol/L (ref 135–145)

## 2023-04-09 LAB — BRAIN NATRIURETIC PEPTIDE: B Natriuretic Peptide: 369.9 pg/mL — ABNORMAL HIGH (ref 0.0–100.0)

## 2023-04-09 MED ORDER — POTASSIUM CHLORIDE CRYS ER 20 MEQ PO TBCR
20.0000 meq | EXTENDED_RELEASE_TABLET | Freq: Every day | ORAL | Status: DC
Start: 2023-04-09 — End: 2023-04-11
  Administered 2023-04-09 – 2023-04-10 (×2): 20 meq via ORAL
  Filled 2023-04-09 (×2): qty 1

## 2023-04-09 MED ORDER — LIDOCAINE HCL 1 % IJ SOLN
INTRAMUSCULAR | Status: AC
  Filled 2023-04-09: qty 20

## 2023-04-09 MED ORDER — POTASSIUM CHLORIDE CRYS ER 20 MEQ PO TBCR
20.0000 meq | EXTENDED_RELEASE_TABLET | Freq: Once | ORAL | Status: AC
  Administered 2023-04-09: 20 meq via ORAL
  Filled 2023-04-09: qty 1

## 2023-04-09 MED ORDER — FUROSEMIDE 10 MG/ML IJ SOLN
40.0000 mg | Freq: Once | INTRAMUSCULAR | Status: AC
  Administered 2023-04-09: 40 mg via INTRAVENOUS
  Filled 2023-04-09: qty 4

## 2023-04-09 MED ORDER — FUROSEMIDE 40 MG PO TABS
40.0000 mg | ORAL_TABLET | Freq: Every day | ORAL | Status: DC
  Administered 2023-04-10 – 2023-04-14 (×5): 40 mg via ORAL
  Filled 2023-04-09 (×5): qty 1

## 2023-04-09 NOTE — Progress Notes (Signed)
 Patient presented to radiology for thoracentesis today.   Bilateral pleural space visualized with ultrasound.  No fluid collection for safe approach found, right pleural space with very small amount of pleural effusion with some loculation. Does not feel that thoracentesis will be successful and/or provide any respiratory relief.  Ultrasound images saved for documentation purpose.   Attending provider notified, family updated via phone.   Thoracentesis was NOT performed.    Helen Yau H De Libman PA-C 04/09/2023 2:49 PM

## 2023-04-09 NOTE — Progress Notes (Signed)
 PMT no charge note.   Chart reviewed. Discussed with TRH MD.  CXR this am with ongoing moderate to large right pleural effusion.  TOC note reviewed, plan is for SNF rehab to [facility] Plan: Recommend addition of palliative services at [facility] rehab upon discharge.  No charge Lonia Serve MD [facility].

## 2023-04-09 NOTE — Plan of Care (Signed)
   Problem: Nutrition: Goal: Adequate nutrition will be maintained Outcome: Progressing

## 2023-04-09 NOTE — TOC Progression Note (Signed)
 Transition of Care [facility]) - Progression Note   Patient Details  Name: Helen Richardson MRN: [id number] Date of Birth: 09/26/1933  Transition of Care [facility]) CM/SW Contact  Duwaine GORMAN Aran, LCSW Phone Number: 04/09/2023, 1:19 PM  Clinical Narrative: Insurance authorization completed for [facility] on [employer] portal. Reference ID # is: [id number]. Patient has been approved for 04/09/2023-04/13/2023.  Expected Discharge Plan: Skilled Nursing Facility Barriers to Discharge: Continued Medical Work up, English As A Second Language Teacher, SNF Pending bed offer  Expected Discharge Plan and Services In-house Referral: Clinical Social Work Post Acute Care Choice: Skilled Nursing Facility Living arrangements for the past 2 months: Single Family Home            DME Arranged: N/A DME Agency: NA  Social Determinants of Health (SDOH) Interventions SDOH Screenings   Food Insecurity: Patient Unable To Answer (03/30/2023)  Housing: Unknown (03/30/2023)  Transportation Needs: No Transportation Needs (03/30/2023)  Utilities: Not At Risk (03/30/2023)  Alcohol Screen: Low Risk  (10/18/2021)  Depression (PHQ2-9): Low Risk  (10/18/2021)  Financial Resource Strain: Low Risk  (10/18/2021)  Physical Activity: Inactive (10/18/2021)  Social Connections: Patient Unable To Answer (03/30/2023)  Stress: No Stress Concern Present (10/18/2021)  Tobacco Use: Low Risk  (03/29/2023)   Readmission Risk Interventions    04/06/2023    2:55 PM  Readmission Risk Prevention Plan  Transportation Screening Complete  PCP or Specialist Appt within 5-7 Days Complete  Home Care Screening Complete  Medication Review (RN CM) Complete

## 2023-04-09 NOTE — Progress Notes (Signed)
 IR was requested for image guided thoracentesis.   Benefit and risks of the thoracentesis was discussed in detail with grandson Mr. Penne Moats, also discuss about the DNR order during the procedure.  Mr. Penne Lenis discussed with family members, informed consent for the thoracentesis obtained, permission obtained to: The DNR order is rescinded during the procedure and the patient consents to the use of any resuscitation procedure needed to treat the clinical events that occur.  This was witness by IR RN.    Will proceed with right thoracentesis when schedule permits.   Please call IR for questions and concerns.   Demari Gales H Inetha Maret PA-C 04/09/2023 12:27 PM

## 2023-04-09 NOTE — Progress Notes (Signed)
 Patient is coughing a lot and appears very weak.  She complained of feeling weak and wanting to vomit.  She was also short of breath for a while with sats of 89% on r/a.  Nurse applied 2 L oxygen and oxygen returned to 95%.  But there's severe and excess coughing  two days now.

## 2023-04-09 NOTE — Plan of Care (Signed)
  Problem: Skin Integrity: Goal: Risk for impaired skin integrity will decrease Outcome: Progressing   

## 2023-04-09 NOTE — Progress Notes (Addendum)
 PROGRESS NOTE Helen Richardson  FMW:[id number] DOB: Aug 11, 1933 DOA: 03/28/2023 PCP: System, Provider Not In  Brief Narrative/Hospital Course: 89 yr old woman who presented to [facility] ED on 03/28/2023 with complaints of right sided flank pain. The patient had had a previous admission in November 2024 for AMS after being found unresponsive. She was found to be hypoglycemic on that occasion. She gradually improved, and was discharged to outpatient follow-up. The patient states that she developed right sided flank pain and high glucoses in the past few days. Upon presentation her glucose was 629.She also had a WBC of 18, Hgb 8.8. and sodium 125. CT abdomen and pelvis which demonstrated a circumferential mass in the distal ascending colon near the hepatic flexure measuring 7 cm. There is also a enhancing complex collection in the right perirenal space measuring 6 cm with concern for tumor or metastatic disease. IR was consulted in the ED for possible drain placement versus biopsy, AND admitted. The patient has been evaluated by IR. As the patient herself is not capable of expressing herself, following commands, or making her own decisions. Her son was at bedside and drain was placed 1/28. Palliative care has been consulted.  Subsequently, milligram by worsening leukocytosis then proceeded to decline. On 04/02/2023 Palliative care had a family meeting: Family decided not to pursue a biopsy, wished for the patient to work with PT and perhaps go on to rehab. They might want to revisit biopsy in the future. ID was consulted and felt she is not appropriate for long term antibiotics.They feel that she is better suited for oral antibiotics.  2/1 had a discussion with the patient's sons regarding her diagnosis, prognosis, and her code status. She is having pain from the drain site. The drain will not be coming out unless she undergoes surgery for the colon mass. This will be a very big surgery and a difficult one for her to  recover from. She is quite weak and debilitated as it is. The alternative would be to make the patient comfort care only.  The sons report that the patient has actually signed a document stating that she does not want any artificial measures to extend her life, family encouraged to discuss about CODE STATUS. 2/2: Elevated CBG, her D10 - discontinued.Family is still not very decisive about taking her back home with hospice and also does not want any extensive procedure. Prior drain cultures with pansensitive E. coli, Streptococcus Anginosis and bacteroid> strep angionsus is sensitive to penicillin, okay to keep Unasyn  and changed to Augmentin  upon discharge per ID. 2/3: episode of hypoglycemia as p.o. intake remained poor- dscontinued Semglee , IR plans for repositioning  drain. Palliative care had further meeting and plan was discharged home with hospice care and family were agreeable.  But subsequently family informed that they do not have 24/7 caregiver at home so unable to go home with hospice and they want to pursue skilled nursing facility and have palliative care follow-up.  With her bacteremia and intra-abdominal fluid collection abscess plan is to discharge on oral Augmentin .  Subjective: Patient seen examined this morning  Alert awake, nursing reports patient has been coughing hypoxic at 89% on room air placed on 2 L nasal cannula. Overnight afebrile, blood sugar stable  Assessment and Plan: Principal Problem:   Intraabdominal fluid collection Active Problems:   Palliative care encounter   Colonic mass   Goals of care, counseling/discussion   Counseling and coordination of care   Need for emotional support   Bacteremia  DNR (do not resuscitate) discussion    Intra-abdominal fluid collection-abscess Colonic Mass Flank pain likely from malignancy/mass: Intra-abdominal fluid collection/abscess in the setting of colon mass, not a candidate for surgery subsequently underwent IR placed  drainage and drain fluid with E. coli Streptococcus and bacteroids, also bacteremic.  Seen by ID, continue on IV antibiotics Unasyn  while here> on discharge will do Augmentin  x 30 days discussed with ID.Concerned about likely metastatic disease or locally advanced colon cancer, no prior colonoscopy palliative care has seen the patient recommending discharge home with hospice-no plan for further biopsy after discussion with the family.  Right Pleural effusion moe-large Acute hypoxic resp failure: Discussed with patient's grandson POA agreeable for ultrasound-guided thoracentesis for symptomatic relief will send cytology as well Continue supplemental oxygen Of note- we wrt is up by 10 lb since admission - home lasix  has been on hold- we will do lasix  40 iv x 1 and hopefully o2 will improve. Check bnp.  Bacteremia with E faecalis-likely intra-abdominal source with abscess Leukocytosis: Prior drain cultures with pansensitive E. coli, Streptococcus Anginosis and bacteroid> strep angionsus is sensitive to penicillin, okay to keep Unasyn  and to change to Augmentin  upon discharge per ID x 30 days.Repeat blood cultures have been negative, echocardiogram was suboptimal but did not show any evidence of endocarditis per ID she would not be appropriate for long-term IV antibiotics.  Dementia Debilitated/deconditioning: Fairly alert awake at time lethargic, continue supportive care continue DNR cont delirium precaution   CKD stage IIIa: creatinine 1.2 with poor oral intake, varying from 0.7-1.2.   Hyponatremia mild: Likely with poor oral intake and?  SIADH   Anemia of chronic disease likely from malignancy: Hemoglobin holding around 7.5 g.b/l hb around 7.7-8.5gm.  Monitor intermittently   Essential hypertension: BP well-controlled without meds  Hypokalemia: Replacing kdur.   GERD: On PPI   Goals of care: Patient has been changed to DNR limited, extensive meeting were held with palliative care  and the family in the light of her colon mass likely malignancy with dementia deconditioning debility, initial plan was discharged home with hospice but at this time family unable to provide 24/7 care at home and they would like to proceed with patient going into rehab with palliative care following as outpatient    Type 2 diabetes mellitus with uncontrolled Hyperglycemia  Episodes of hypoglycemia due to poor oral intake: Blood sugar remains well-controlled, no hypoglycemia.  Off long-acting insulin   CBG OK,  DVT prophylaxis: enoxaparin  (LOVENOX ) injection 40 mg Start: 04/01/23 1000 SCDs Start: 03/29/23 0034 Code Status:   Code Status: Limited: Do not attempt resuscitation (DNR) -DNR-LIMITED -Do Not Intubate/DNI  Family Communication: plan of care discussed with patient/none at bedside.  Updated patient's grandson Patient status is: Remains hospitalized because of severity of illness. Level of care: Med-Surg.   Dispo: The patient is from:Home.            Anticipated disposition:Awaiting skilled facility.    Objective: Vitals last 24 hrs: Vitals:   04/08/23 1256 04/08/23 2003 04/09/23 0622 04/09/23 0623  BP: 136/66 (!) 164/80 (!) 164/81   Pulse: (!) 110 (!) 102 (!) 114 (!) 102  Resp: 18 18 20    Temp: 98.1 F (36.7 C) 97.8 F (36.6 C) 97.6 F (36.4 C)   TempSrc:  Oral Oral   SpO2: 96% 100% 93% 93%  Weight:       Weight change:   Physical Examination: General exam: alert awake, oriented at baseline, older than stated age HEENT:Oral mucosa moist, Ear/Nose  WNL grossly Respiratory system: Diminished breath sounds on the right lung base  Cardiovascular system: S1 & S2 +, No JVD. Gastrointestinal system: Abdomen soft,NT,ND, BS+ Nervous System: Alert, awake, moving all extremities,and following commands. Extremities: LE edema neg,distal peripheral pulses palpable and warm.  Skin: No rashes,no icterus. MSK: Normal muscle bulk,tone, power   Medications reviewed:  Scheduled Meds:   enoxaparin  (LOVENOX ) injection  40 mg Subcutaneous Q24H   insulin  aspart  0-9 Units Subcutaneous TID WC   pantoprazole   40 mg Oral BID   sodium chloride  flush  5 mL Intracatheter Q8H  Continuous Infusions:  ampicillin -sulbactam (UNASYN ) IV 3 g (04/09/23 9161)    Diet Order             Diet Carb Modified Fluid consistency: Thin; Room service appropriate? Yes  Diet effective now                  Intake/Output Summary (Last 24 hours) at 04/09/2023 1139 Last data filed at 04/09/2023 1000 Gross per 24 hour  Intake 420 ml  Output 300 ml  Net 120 ml   Net IO Since Admission: 9,997.69 mL [04/09/23 1139]  Wt Readings from Last 3 Encounters:  03/28/23 76.1 kg  01/17/23 76.1 kg  11/19/22 79.8 kg     Unresulted Labs (From admission, onward)    None     Data Reviewed: I have personally reviewed following labs and imaging studies CBC: Recent Labs  Lab 04/04/23 0533 04/05/23 0512  WBC 26.8* 20.4*  NEUTROABS 21.0* 15.9*  HGB 7.4* 7.5*  HCT 23.2* 23.4*  MCV 75.6* 74.1*  PLT 372 PLATELET CLUMPS NOTED ON SMEAR, COUNT APPEARS ADEQUATE   Basic Metabolic Panel:  Recent Labs  Lab 04/04/23 0533 04/05/23 0512  NA 134* 132*  K 3.8 3.9  CL 102 102  CO2 23 21*  GLUCOSE 148* 219*  BUN 10 9  CREATININE 1.28* 1.20*  CALCIUM  7.5* 7.5*   GFR: Estimated Creatinine Clearance: 33.8 mL/min (A) (by C-G formula based on SCr of 1.2 mg/dL (H)). Liver Function Tests:  Recent Labs  Lab 04/08/23 0746 04/08/23 1208 04/08/23 1714 04/08/23 2139 04/09/23 0757  GLUCAP 84 116* 190* 99 137*   No results for input(s): CHOL, HDL, LDLCALC, TRIG, CHOLHDL, LDLDIRECT in the last 72 hours. No results for input(s): TSH, T4TOTAL, FREET4, T3FREE, THYROIDAB in the last 72 hours. Sepsis Labs: No results for input(s): PROCALCITON, LATICACIDVEN in the last 168 hours. Recent Results (from the past 240 hours)  Culture, blood (Routine X 2) w Reflex to ID Panel     Status: None    Collection Time: 03/31/23  9:37 AM   Specimen: BLOOD  Result Value Ref Range Status   Specimen Description   Final    BLOOD BLOOD LEFT HAND Performed at [facility], [address], [place], [place] [address]    Special Requests   Final    BOTTLES DRAWN AEROBIC ONLY Blood Culture results may not be optimal due to an inadequate volume of blood received in culture bottles Performed at [facility], [address] [place], [place] [address]    Culture   Final    NO GROWTH 5 DAYS Performed at [facility], [address] [place], [place] [address]    Report Status 04/05/2023 FINAL  Final  Culture, blood (Routine X 2) w Reflex to ID Panel     Status: None   Collection Time: 03/31/23  9:42 AM   Specimen: BLOOD  Result Value Ref Range Status   Specimen Description   Final    BLOOD BLOOD RIGHT HAND Performed at [facility], [address], [place], [place] [address]    Special Requests   Final    BOTTLES DRAWN AEROBIC ONLY Blood Culture results may not be optimal due to an inadequate volume of blood received in culture bottles Performed at [facility], [address] [place], [place] [address]    Culture   Final    NO GROWTH 5 DAYS Performed at [facility], [address] [place], [place] [address]    Report Status 04/05/2023 FINAL  Final  Aerobic/Anaerobic Culture w Gram Stain (surgical/deep wound)     Status: None   Collection Time: 03/31/23  4:42 PM   Specimen: Abdomen; Abscess  Result Value Ref Range Status   Specimen Description   Final    ABDOMEN Performed at [facility], [address], [place], [place] [address]    Special Requests   Final    NONE Performed at [facility], [address] [place], [place] [address]    Gram Stain   Final    ABUNDANT WBC PRESENT,BOTH PMN AND MONONUCLEAR ABUNDANT GRAM POSITIVE COCCI    Culture   Final     ABUNDANT ESCHERICHIA COLI ABUNDANT STREPTOCOCCUS ANGINOSIS ABUNDANT BACTEROIDES THETAIOTAOMICRON BETA LACTAMASE POSITIVE Performed at [facility], [address] [place], [place] [address]    Report Status 04/05/2023 FINAL  Final   Organism ID, Bacteria ESCHERICHIA COLI  Final   Organism ID, Bacteria STREPTOCOCCUS ANGINOSIS  Final      Susceptibility   Escherichia coli - MIC*    AMPICILLIN  <=2 SENSITIVE Sensitive     CEFEPIME <=0.12 SENSITIVE Sensitive     CEFTAZIDIME <=1 SENSITIVE Sensitive     CEFTRIAXONE  <=0.25 SENSITIVE Sensitive     CIPROFLOXACIN <=0.25 SENSITIVE Sensitive     GENTAMICIN <=1 SENSITIVE Sensitive     IMIPENEM <=0.25 SENSITIVE Sensitive     TRIMETH/SULFA <=20 SENSITIVE Sensitive     AMPICILLIN /SULBACTAM <=2 SENSITIVE Sensitive     PIP/TAZO <=4 SENSITIVE Sensitive ug/mL    * ABUNDANT ESCHERICHIA COLI   Streptococcus anginosis - MIC*    PENICILLIN <=0.06 SENSITIVE Sensitive     CEFTRIAXONE  <=0.12 SENSITIVE Sensitive     ERYTHROMYCIN <=0.12 SENSITIVE Sensitive     LEVOFLOXACIN 0.5 SENSITIVE Sensitive     VANCOMYCIN  0.5 SENSITIVE Sensitive     * ABUNDANT STREPTOCOCCUS ANGINOSIS  Culture, blood (Routine X 2) w Reflex to ID Panel     Status: None   Collection Time: 03/31/23  5:22 PM   Specimen: BLOOD  Result Value Ref Range Status   Specimen Description   Final    BLOOD BLOOD RIGHT HAND Performed at [facility], [address], [place], [place] [address]    Special Requests   Final    BOTTLES DRAWN AEROBIC ONLY Blood Culture results may not be optimal due to an inadequate volume of blood received in culture bottles Performed at [facility], [address], [place], [place] [address]    Culture   Final    NO GROWTH 5 DAYS Performed at [facility], [address] [place], [place] [address]    Report Status 04/05/2023 FINAL  Final  Culture, blood (Routine X 2) w Reflex to ID Panel     Status: None    Collection Time: 03/31/23  5:34 PM   Specimen: BLOOD  Result Value Ref Range Status   Specimen Description   Final    BLOOD BLOOD LEFT HAND Performed at [facility], [address], [place], [place] [address]    Special Requests   Final    BOTTLES DRAWN AEROBIC ONLY Blood Culture results may not be optimal due to an inadequate volume of blood received in culture bottles Performed at [facility], [address], [place], [place] [address]    Culture   Final    NO GROWTH 5 DAYS Performed at [facility], [address] [place], [place] [address]    Report Status 04/05/2023 FINAL  Final    Antimicrobials/Microbiology: Anti-infectives (From admission, onward)    Start     Dose/Rate Route Frequency Ordered Stop   04/08/23 0000  amoxicillin -clavulanate (AUGMENTIN ) 875-125 MG tablet        1 tablet Oral 2 times daily 04/08/23 1152     03/31/23 1600  Ampicillin -Sulbactam (UNASYN ) 3 g in sodium chloride  0.9 % 100 mL IVPB        3 g 200 mL/hr over 30 Minutes Intravenous Every 6 hours 03/31/23 1451     03/30/23 2300  ampicillin  (OMNIPEN) 2 g in sodium chloride  0.9 % 100 mL IVPB  Status:  Discontinued        2 g 300 mL/hr over 20 Minutes Intravenous Every 6 hours 03/30/23 2204 03/31/23 1451   03/29/23 1000  ceFEPIme (MAXIPIME) 2 g in sodium chloride  0.9 % 100 mL IVPB  Status:  Discontinued        2 g 200 mL/hr over 30 Minutes Intravenous Every 12 hours 03/28/23 2348 03/29/23 0040   03/29/23 0000  vancomycin  (VANCOCIN ) IVPB 1000 mg/200 mL premix  Status:  Discontinued        1,000 mg 200 mL/hr over 60 Minutes Intravenous  Once 03/28/23 2348 03/29/23 0040   03/28/23 2215  cefTRIAXone  (ROCEPHIN ) 2 g in sodium chloride  0.9 % 100 mL IVPB        2 g 200 mL/hr over 30 Minutes Intravenous  Once 03/28/23 2203 03/29/23 0002         Component Value Date/Time   SDES  03/31/2023 1734    BLOOD BLOOD LEFT HAND Performed at [facility], [address] [place], [place] [address]    SPECREQUEST  03/31/2023 1734    BOTTLES DRAWN AEROBIC ONLY Blood Culture results may not be optimal due to an inadequate volume of blood received in culture bottles Performed at [facility], [address], [place], [place] [address]    CULT  03/31/2023 1734    NO GROWTH 5 DAYS Performed at [facility], [address], [place], [place] [address]    REPTSTATUS 04/05/2023 FINAL 03/31/2023 1734     Radiology Studies: [facility] Chest Port 1 View Result Date: 04/09/2023 CLINICAL DATA:  89 year old female with shortness of breath. Colon cancer. EXAM: PORTABLE CHEST 1 VIEW COMPARISON:  Portable chest 01/16/2023. CT Abdomen and Pelvis 04/04/2023. FINDINGS: Portable AP semi upright view at 0751 hours. Moderate to large right pleural effusion with partial right lung collapse which was visible on the recent CT Abdomen and Pelvis. Left lung appears negative. Stable visible mediastinal contours. Calcified aortic atherosclerosis. Visualized tracheal air column is within normal limits. No pneumothorax or pulmonary edema. Right abdominal drainage catheter redemonstrated. Paucity of bowel gas. Stable visualized osseous structures. IMPRESSION: 1. Ongoing moderate to large  right pleural effusion, as seen on recent CT Abdomen and Pelvis. 2. No new cardiopulmonary abnormality. Electronically Signed   By: VEAR Hurst M.D.   On: 04/09/2023 08:16     LOS: 11 days   Total time spent in review of labs and imaging, patient evaluation, formulation of plan, documentation and communication with family: 35 minutes  Mennie LAMY, MD  [facility]  04/09/2023, 11:39 AM

## 2023-04-09 NOTE — TOC Progression Note (Addendum)
 Transition of Care [employer]) - Progression Note    Patient Details  Name: Helen Richardson MRN: [id number] Date of Birth: [birth year]  Transition of [facility]) CM/SW Contact  Sonda Manuella Quill, RN Phone Number: 04/09/2023, 1:29 PM  Clinical Narrative:    Shona at [facility] notified ins auth received and recc for PC at facility; Dr CHRISTOBAL Guadalajara notified via secure chat.  -[id number]- notified by Dr Guadalajara pt not ready for d/c; Rhonda at a=facility notified.  Expected Discharge Plan: Skilled Nursing Facility Barriers to Discharge: Continued Medical Work up, English As A Second Language Teacher, SNF Pending bed offer  Expected Discharge Plan and Services In-house Referral: Clinical Social Work   Post Acute Care Choice: Skilled Nursing Facility Living arrangements for the past 2 months: Single Family Home                 DME Arranged: N/A DME Agency: NA                   Social Determinants of Health (SDOH) Interventions SDOH Screenings   Food Insecurity: Patient Unable To Answer (03/30/2023)  Housing: Unknown (03/30/2023)  Transportation Needs: No Transportation Needs (03/30/2023)  Utilities: Not At Risk (03/30/2023)  Alcohol Screen: Low Risk  (10/18/2021)  Depression (PHQ2-9): Low Risk  (10/18/2021)  Financial Resource Strain: Low Risk  (10/18/2021)  Physical Activity: Inactive (10/18/2021)  Social Connections: Patient Unable To Answer (03/30/2023)  Stress: No Stress Concern Present (10/18/2021)  Tobacco Use: Low Risk  (03/29/2023)    Readmission Risk Interventions    04/06/2023    2:55 PM  Readmission Risk Prevention Plan  Transportation Screening Complete  PCP or Specialist Appt within 5-7 Days Complete  Home Care Screening Complete  Medication Review (RN CM) Complete

## 2023-04-10 ENCOUNTER — Inpatient Hospital Stay (HOSPITAL_COMMUNITY): Payer: Medicare PPO

## 2023-04-10 DIAGNOSIS — R188 Other ascites: Secondary | ICD-10-CM | POA: Diagnosis not present

## 2023-04-10 LAB — GLUCOSE, CAPILLARY
Glucose-Capillary: 175 mg/dL — ABNORMAL HIGH (ref 70–99)
Glucose-Capillary: 111 mg/dL — ABNORMAL HIGH (ref 70–99)
Glucose-Capillary: 83 mg/dL (ref 70–99)
Glucose-Capillary: 69 mg/dL — ABNORMAL LOW (ref 70–99)
Glucose-Capillary: 77 mg/dL (ref 70–99)

## 2023-04-10 LAB — BASIC METABOLIC PANEL
Anion gap: 10 (ref 5–15)
BUN: <5 mg/dL — ABNORMAL LOW (ref 8–23)
CO2: 24 mmol/L (ref 22–32)
Calcium: 7.5 mg/dL — ABNORMAL LOW (ref 8.9–10.3)
Chloride: 104 mmol/L (ref 98–111)
Creatinine, Ser: 0.82 mg/dL (ref 0.44–1.00)
GFR, Estimated: >60 mL/min (ref >60)
Glucose, Bld: 151 mg/dL — ABNORMAL HIGH (ref 70–99)
Potassium: 3.3 mmol/L — ABNORMAL LOW (ref 3.5–5.1)
Sodium: 138 mmol/L (ref 135–145)

## 2023-04-10 LAB — CBC
HCT: 26.1 % — ABNORMAL LOW (ref 36.0–46.0)
Hemoglobin: 7.7 g/dL — ABNORMAL LOW (ref 12.0–15.0)
MCH: 23.1 pg — ABNORMAL LOW (ref 26.0–34.0)
MCHC: 29.5 g/dL — ABNORMAL LOW (ref 30.0–36.0)
MCV: 78.1 fL — ABNORMAL LOW (ref 80.0–100.0)
Platelets: 534 10*3/uL — ABNORMAL HIGH (ref 150–400)
RBC: 3.34 MIL/uL — ABNORMAL LOW (ref 3.87–5.11)
RDW: 20.2 % — ABNORMAL HIGH (ref 11.5–15.5)
WBC: 10.2 10*3/uL (ref 4.0–10.5)
nRBC: 0 % (ref 0.0–0.2)

## 2023-04-10 MED ORDER — POTASSIUM CHLORIDE 10 MEQ/100ML IV SOLN
10.0000 meq | INTRAVENOUS | Status: AC
  Administered 2023-04-10 (×2): 10 meq via INTRAVENOUS
  Filled 2023-04-10: qty 100

## 2023-04-10 MED ORDER — IOHEXOL 350 MG/ML SOLN
75.0000 mL | Freq: Once | INTRAVENOUS | Status: AC | PRN
  Administered 2023-04-10: 75 mL via INTRAVENOUS

## 2023-04-10 MED ORDER — ENSURE ENLIVE PO LIQD
237.0000 mL | Freq: Two times a day (BID) | ORAL | Status: DC
  Administered 2023-04-11 – 2023-04-13 (×3): 237 mL via ORAL

## 2023-04-10 MED ORDER — SODIUM CHLORIDE (PF) 0.9 % IJ SOLN
INTRAMUSCULAR | Status: AC
  Filled 2023-04-10: qty 50

## 2023-04-10 NOTE — Plan of Care (Signed)
  Problem: Activity: Goal: Risk for activity intolerance will decrease Outcome: Not Progressing   Problem: Nutrition: Goal: Adequate nutrition will be maintained Outcome: Not Progressing   

## 2023-04-10 NOTE — Progress Notes (Signed)
 SLP Cancellation Note  Patient Details Name: Helen Richardson MRN: [id number] DOB: [birth year]   Cancelled treatment:       Reason Eval/Treat Not Completed: Other (comment) (Per RN, pt is requiring 5L of oxygen now, significant cough and has been lethargic all day.  She is refusing po intake.   Will continue efforts.)  [name], MS [facility] SLP Acute Rehab Services Office [phone]   [name] 04/10/2023, 3:37 PM

## 2023-04-10 NOTE — Progress Notes (Signed)
 PT Cancellation Note  Patient Details Name: CANARY FISTER MRN: [id number] DOB: October 18, 1933   Cancelled Treatment:    Reason Eval/Treat Not Completed: Medical issues which prohibited therapy (remains on 5L O2 [place], CTA ordered)   Kati L Payson 04/10/2023, 1:23 PM Tari KLEIN, DPT Physical Therapist Acute Rehabilitation Services Office: [phone]

## 2023-04-10 NOTE — Plan of Care (Signed)
   Problem: Clinical Measurements: Goal: Diagnostic test results will improve Outcome: Progressing Goal: Respiratory complications will improve Outcome: Progressing

## 2023-04-10 NOTE — Progress Notes (Addendum)
 PROGRESS NOTE NAKAILA FREEZE  FMW:[id number] DOB: 1933/08/12 DOA: 03/28/2023 PCP: System, Provider Not In  Brief Narrative/Hospital Course: 89 yr old woman who presented to [facility] ED on 03/28/2023 with complaints of right sided flank pain. The patient had had a previous admission in November 2024 for AMS after being found unresponsive. She was found to be hypoglycemic on that occasion. She gradually improved, and was discharged to outpatient follow-up. The patient states that she developed right sided flank pain and high glucoses in the past few days. Upon presentation her glucose was 629.She also had a WBC of 18, Hgb 8.8. and sodium 125. CT abdomen and pelvis which demonstrated a circumferential mass in the distal ascending colon near the hepatic flexure measuring 7 cm. There is also a enhancing complex collection in the right perirenal space measuring 6 cm with concern for tumor or metastatic disease. IR was consulted in the ED for possible drain placement versus biopsy, AND admitted. The patient has been evaluated by IR. As the patient herself is not capable of expressing herself, following commands, or making her own decisions. Her son was at bedside and drain was placed 1/28. Palliative care has been consulted.  Subsequently, milligram by worsening leukocytosis then proceeded to decline. On 04/02/2023 Palliative care had a family meeting: Family decided not to pursue a biopsy, wished for the patient to work with PT and perhaps go on to rehab. They might want to revisit biopsy in the future. ID was consulted and felt she is not appropriate for long term antibiotics.They feel that she is better suited for oral antibiotics.  2/1 had a discussion with the patient's sons regarding her diagnosis, prognosis, and her code status. She is having pain from the drain site. The drain will not be coming out unless she undergoes surgery for the colon mass. This will be a very big surgery and a difficult one for her to  recover from. She is quite weak and debilitated as it is. The alternative would be to make the patient comfort care only.  The sons report that the patient has actually signed a document stating that she does not want any artificial measures to extend her life, family encouraged to discuss about CODE STATUS. 2/2: Elevated CBG, her D10 - discontinued.Family is still not very decisive about taking her back home with hospice and also does not want any extensive procedure. Prior drain cultures with pansensitive E. coli, Streptococcus Anginosis and bacteroid> strep angionsus is sensitive to penicillin, okay to keep Unasyn  and changed to Augmentin  upon discharge per ID. 2/3: episode of hypoglycemia as p.o. intake remained poor- dscontinued Semglee , IR plans for repositioning  drain. Palliative care had further meeting and plan was discharged home with hospice care and family were agreeable.  But subsequently family informed that they do not have 24/7 caregiver at home so unable to go home with hospice and they want to pursue skilled nursing facility and have palliative care follow-up.  With her bacteremia and intra-abdominal fluid collection abscess plan is to discharge on oral Augmentin .  Subjective: Seen examined Bit lethargic today and having diarrhea Still needing 5l [place] Patient denies any complaint Overnight afebrile BP stable Labs pending this morning  Assessment and Plan: Principal Problem:   Intraabdominal fluid collection Active Problems:   Palliative care encounter   Colonic mass   Goals of care, counseling/discussion   Counseling and coordination of care   Need for emotional support   Bacteremia   DNR (do not resuscitate) discussion  Intra-abdominal fluid collection-abscess Colonic Mass Flank pain likely from malignancy/mass: Intra-abdominal fluid collection/abscess in the setting of colon mass, not a candidate for surgery subsequently underwent IR placed drainage and drain fluid  with E. coli Streptococcus and bacteroids, also bacteremic.  Seen by ID, continue on IV antibiotics Unasyn  while here> on discharge will do Augmentin  x 30 days discussed with ID.Concerned about likely metastatic disease or locally advanced colon cancer, no prior colonoscopy palliative care has seen the patient -initial plan was home with hospice but at this time pursuing SNF due to lack of 24/7 care at home    Acute hypoxic resp failure: Right Pleural effusion;  Some fluid overload based upon her weight gain, BNP of 369, given Lasix  IV dose Lasix  and resuming home Lasix . IR consulted for thoracentesis on 2/6 but unable to see much fluid to tap on ultrasound Given ongoing hypoxia shortness of breath History today will obtain CT angio chest if renal function is stable today will obtain CT angio chest to rule out PE and other etiology  Bacteremia with E faecalis-likely intra-abdominal source with abscess Leukocytosis: Prior drain cultures with pansensitive E. coli, Streptococcus Anginosis and bacteroid> strep angionsus is sensitive to penicillin, okay to keep Unasyn  and to change to Augmentin  upon discharge per ID x 30 days.Repeat blood cultures have been negative, echocardiogram was suboptimal but did not show any evidence of endocarditis per ID she would not be appropriate for long-term IV antibiotics.  Continue probiotics for diarrhea monitor counts Initially leukocytosis of 26k-resolved to 10k on 2/6 Recent Labs  Lab 04/04/23 0533 04/05/23 0512 04/09/23 1544 04/10/23 1107  WBC 26.8* 20.4* 10.3 10.2    Dementia Debilitated/deconditioning: At times bit lethargic remains weak deconditioned continue supportive care fall precaution delirium precaution    CKD stage IIIa Hypokalemia: creatinine 1.2 with poor oral intake, varying from 0.7-1.2.  Potassium replaced, recheck pending-mag low continue potassium supplement IV and p.o., monitor renal function Recent Labs    03/28/23 1641  03/28/23 1649 03/29/23 0401 03/30/23 0424 03/31/23 0518 04/01/23 0508 04/02/23 0455 04/04/23 0533 04/05/23 0512 04/09/23 1544 04/10/23 1107  BUN 25* 29* 19 14 15 12 10 10 9  <5* <5*  CREATININE 1.11* 1.20* 0.75 0.90 0.75 1.13* 1.00 1.28* 1.20* 0.92 0.82  CO2 26  --  26 25 25 22 24 23  21* 23 24  K 5.1 5.6* 4.4 4.4 4.0 4.6 3.6 3.8 3.9 3.3* 3.3*    Hyponatremia mild: Likely with poor oral intake and?  SIADH   Anemia of chronic disease likely from malignancy: Hemoglobin holding around 7.5 g.b/l hb around 7.7-8.5gm.  Monitor intermittently and transfuse if less than 7 Recent Labs  Lab 04/04/23 0533 04/05/23 0512 04/09/23 1544 04/10/23 1107  HGB 7.4* 7.5* 7.9* 7.7*  HCT 23.2* 23.4* 26.2* 26.1*     Essential hypertension: BP well-controlled without meds.   GERD: On PPI   Goals of care: Patient has been changed to DNR limited, extensive meeting were held with palliative care and the family in the light of her colon mass likely malignancy with dementia deconditioning debility, initial plan was discharged home with hospice but at this time family unable to provide 24/7 care at home and they would like to proceed with patient going into rehab with palliative care following as outpatient.    Type 2 diabetes mellitus with uncontrolled Hyperglycemia  Episodes of hypoglycemia due to poor oral intake: Blood sugar remains well-controlled, no hypoglycemia.  Off long-acting insulin   CBG stable as below Recent Labs  Lab 04/08/23 2139 04/09/23 0757 04/09/23 1138 04/09/23 1609 04/10/23 0730  GLUCAP 99 137* 91 89 175*    DVT prophylaxis: enoxaparin  (LOVENOX ) injection 40 mg Start: 04/01/23 1000 SCDs Start: 03/29/23 0034 Code Status:   Code Status: Limited: Do not attempt resuscitation (DNR) -DNR-LIMITED -Do Not Intubate/DNI  Family Communication: plan of care discussed with patient/none at bedside.  Updated patient's grandson POA 2/6 and also on 04/10/23 and updated about clinical  condition and agreeablefor CT scan.  Patient status is: Remains hospitalized because of severity of illness. Level of care: Med-Surg.   Dispo: The patient is from:Home.            Anticipated disposition:Awaiting skilled facility.    Objective: Vitals last 24 hrs: Vitals:   04/09/23 1454 04/09/23 1737 04/09/23 2056 04/10/23 0518  BP: (!) 149/71  129/86 (!) 150/73  Pulse: (!) 108  71 93  Resp:   15 14  Temp:   97.8 F (36.6 C) (!) 97.5 F (36.4 C)  TempSrc:   Oral Oral  SpO2: 92%  100% 98%  Weight:  80.3 kg    Weight change:   Physical Examination: General exam: Mild lethargic, able to wake up and interact  HEENT:Oral mucosa moist, Ear/Nose WNL grossly Respiratory system: Bilaterally clear BS,no use of accessory muscle Cardiovascular system: S1 & S2 +, No JVD. Gastrointestinal system: Abdomen soft tenderness on the drain area, drain present bowel sounds present, abdomen soft Nervous System: Alert, awake, moving all extremities but with gross weakness all over Extremities: LE edema neg,distal peripheral pulses palpable and warm.  Skin: No rashes,no icterus. MSK: Normal muscle bulk,tone, power   Medications reviewed:  Scheduled Meds:  enoxaparin  (LOVENOX ) injection  40 mg Subcutaneous Q24H   furosemide   40 mg Oral Daily   insulin  aspart  0-9 Units Subcutaneous TID WC   pantoprazole   40 mg Oral BID   potassium chloride   20 mEq Oral Daily   sodium chloride  flush  5 mL Intracatheter Q8H  Continuous Infusions:  ampicillin -sulbactam (UNASYN ) IV 3 g (04/10/23 0841)   potassium chloride  10 mEq (04/10/23 1313)    Diet Order             Diet Carb Modified Fluid consistency: Thin; Room service appropriate? Yes  Diet effective now                  Intake/Output Summary (Last 24 hours) at 04/10/2023 1315 Last data filed at 04/10/2023 1000 Gross per 24 hour  Intake 907.18 ml  Output 820 ml  Net 87.18 ml   Net IO Since Admission: 10,084.87 mL [04/10/23 1315]  Wt Readings  from Last 3 Encounters:  04/09/23 80.3 kg  01/17/23 76.1 kg  11/19/22 79.8 kg     Unresulted Labs (From admission, onward)     Start     Ordered   04/11/23 0500  Basic metabolic panel  Daily,   R      04/10/23 1043   04/11/23 0500  CBC  Daily,   R      04/10/23 1043          Data Reviewed: I have personally reviewed following labs and imaging studies CBC: Recent Labs  Lab 04/04/23 0533 04/05/23 0512 04/09/23 1544 04/10/23 1107  WBC 26.8* 20.4* 10.3 10.2  NEUTROABS 21.0* 15.9*  --   --   HGB 7.4* 7.5* 7.9* 7.7*  HCT 23.2* 23.4* 26.2* 26.1*  MCV 75.6* 74.1* 77.1* 78.1*  PLT 372 PLATELET CLUMPS NOTED ON SMEAR,  COUNT APPEARS ADEQUATE 528* 534*   Basic Metabolic Panel:  Recent Labs  Lab 04/04/23 0533 04/05/23 0512 04/09/23 1544 04/10/23 1107  NA 134* 132* 138 138  K 3.8 3.9 3.3* 3.3*  CL 102 102 104 104  CO2 23 21* 23 24  GLUCOSE 148* 219* 155* 151*  BUN 10 9 <5* <5*  CREATININE 1.28* 1.20* 0.92 0.82  CALCIUM  7.5* 7.5* 7.3* 7.5*   GFR: Estimated Creatinine Clearance: 50.7 mL/min (by C-G formula based on SCr of 0.82 mg/dL). Liver Function Tests:  Recent Labs  Lab 04/08/23 2139 04/09/23 0757 04/09/23 1138 04/09/23 1609 04/10/23 0730  GLUCAP 99 137* 91 89 175*   No results for input(s): CHOL, HDL, LDLCALC, TRIG, CHOLHDL, LDLDIRECT in the last 72 hours. No results for input(s): TSH, T4TOTAL, FREET4, T3FREE, THYROIDAB in the last 72 hours. Sepsis Labs: No results for input(s): PROCALCITON, LATICACIDVEN in the last 168 hours. Recent Results (from the past 240 hours)  Aerobic/Anaerobic Culture w Gram Stain (surgical/deep wound)     Status: None   Collection Time: 03/31/23  4:42 PM   Specimen: Abdomen; Abscess  Result Value Ref Range Status   Specimen Description   Final    ABDOMEN Performed at [facility], [address] [place], [place] [address]    Special Requests   Final    NONE Performed at [facility], [address], [place], [place] [address]    Gram Stain   Final    ABUNDANT WBC PRESENT,BOTH PMN AND MONONUCLEAR ABUNDANT GRAM POSITIVE COCCI    Culture   Final    ABUNDANT ESCHERICHIA COLI ABUNDANT STREPTOCOCCUS ANGINOSIS ABUNDANT BACTEROIDES THETAIOTAOMICRON BETA LACTAMASE POSITIVE Performed at [facility], [address], [place], [place] [address]    Report Status 04/05/2023 FINAL  Final   Organism ID, Bacteria ESCHERICHIA COLI  Final   Organism ID, Bacteria STREPTOCOCCUS ANGINOSIS  Final      Susceptibility   Escherichia coli - MIC*    AMPICILLIN  <=2 SENSITIVE Sensitive     CEFEPIME <=0.12 SENSITIVE Sensitive     CEFTAZIDIME <=1 SENSITIVE Sensitive     CEFTRIAXONE  <=0.25 SENSITIVE Sensitive     CIPROFLOXACIN <=0.25 SENSITIVE Sensitive     GENTAMICIN <=1 SENSITIVE Sensitive     IMIPENEM <=0.25 SENSITIVE Sensitive     TRIMETH/SULFA <=20 SENSITIVE Sensitive     AMPICILLIN /SULBACTAM <=2 SENSITIVE Sensitive     PIP/TAZO <=4 SENSITIVE Sensitive ug/mL    * ABUNDANT ESCHERICHIA COLI   Streptococcus anginosis - MIC*    PENICILLIN <=0.06 SENSITIVE Sensitive     CEFTRIAXONE  <=0.12 SENSITIVE Sensitive     ERYTHROMYCIN <=0.12 SENSITIVE Sensitive     LEVOFLOXACIN 0.5 SENSITIVE Sensitive     VANCOMYCIN  0.5 SENSITIVE Sensitive     * ABUNDANT STREPTOCOCCUS ANGINOSIS  Culture, blood (Routine X 2) w Reflex to ID Panel     Status: None   Collection Time: 03/31/23  5:22 PM   Specimen: BLOOD  Result Value Ref Range Status   Specimen Description   Final    BLOOD BLOOD RIGHT HAND Performed at [facility], [address] [place], [place] [address]    Special Requests   Final    BOTTLES DRAWN AEROBIC ONLY Blood Culture results may not be optimal due to an inadequate volume of blood received in culture bottles Performed at [facility], [address] [place], [place] [address]    Culture   Final  NO GROWTH 5  DAYS Performed at [facility], [address], [place], [place] [address]    Report Status 04/05/2023 FINAL  Final  Culture, blood (Routine X 2) w Reflex to ID Panel     Status: None   Collection Time: 03/31/23  5:34 PM   Specimen: BLOOD  Result Value Ref Range Status   Specimen Description   Final    BLOOD BLOOD LEFT HAND Performed at [facility], [address] [place], [place] [address]    Special Requests   Final    BOTTLES DRAWN AEROBIC ONLY Blood Culture results may not be optimal due to an inadequate volume of blood received in culture bottles Performed at [facility], [name] [place], [place] [address]    Culture   Final    NO GROWTH 5 DAYS Performed at [facility], [address] [place], [place] [address]    Report Status 04/05/2023 FINAL  Final    Antimicrobials/Microbiology: Anti-infectives (From admission, onward)    Start     Dose/Rate Route Frequency Ordered Stop   04/08/23 0000  amoxicillin -clavulanate (AUGMENTIN ) 875-125 MG tablet        1 tablet Oral 2 times daily 04/08/23 1152     03/31/23 1600  Ampicillin -Sulbactam (UNASYN ) 3 g in sodium chloride  0.9 % 100 mL IVPB        3 g 200 mL/hr over 30 Minutes Intravenous Every 6 hours 03/31/23 1451     03/30/23 2300  ampicillin  (OMNIPEN) 2 g in sodium chloride  0.9 % 100 mL IVPB  Status:  Discontinued        2 g 300 mL/hr over 20 Minutes Intravenous Every 6 hours 03/30/23 2204 03/31/23 1451   03/29/23 1000  ceFEPIme (MAXIPIME) 2 g in sodium chloride  0.9 % 100 mL IVPB  Status:  Discontinued        2 g 200 mL/hr over 30 Minutes Intravenous Every 12 hours 03/28/23 2348 03/29/23 0040   03/29/23 0000  vancomycin  (VANCOCIN ) IVPB 1000 mg/200 mL premix  Status:  Discontinued        1,000 mg 200 mL/hr over 60 Minutes Intravenous  Once 03/28/23 2348 03/29/23 0040   03/28/23 2215  cefTRIAXone  (ROCEPHIN ) 2 g in sodium chloride  0.9 % 100 mL IVPB        2 g 200  mL/hr over 30 Minutes Intravenous  Once 03/28/23 2203 03/29/23 0002         Component Value Date/Time   SDES  03/31/2023 1734    BLOOD BLOOD LEFT HAND Performed at [facility], [address] [place], [place] [address]    SPECREQUEST  03/31/2023 1734    BOTTLES DRAWN AEROBIC ONLY Blood Culture results may not be optimal due to an inadequate volume of blood received in culture bottles Performed at [facility], [address] [place], [place] [address]    CULT  03/31/2023 1734    NO GROWTH 5 DAYS Performed at [facility], [address], [place], [place] [address]    REPTSTATUS 04/05/2023 FINAL 03/31/2023 1734     Radiology Studies: US  CHEST (PLEURAL EFFUSION) Result Date: 04/09/2023 INDICATION: 89 year old female presents with shortness of breath. Previous imaging showed right pleural effusion. Request for therapeutic and diagnostic thoracentesis. EXAM: CHEST ULTRASOUND COMPARISON:  None Available. FINDINGS: Bilateral pleural space visualized with ultrasound. No pleural effusion in the left pleural cavity. Small pleural effusion in the right pleural cavity  with loculation. No safe window for thoracentesis found. IMPRESSION: Small right pleural effusion with loculation without safe window for thoracentesis. Thoracentesis was not performed. Performed by: Aimee Han, PA-C Electronically Signed   By: Marcey Moan M.D.   On: 04/09/2023 16:42   DG Chest Port 1 View Result Date: 04/09/2023 CLINICAL DATA:  89 year old female with shortness of breath. Colon cancer. EXAM: PORTABLE CHEST 1 VIEW COMPARISON:  Portable chest 01/16/2023. CT Abdomen and Pelvis 04/04/2023. FINDINGS: Portable AP semi upright view at 0751 hours. Moderate to large right pleural effusion with partial right lung collapse which was visible on the recent CT Abdomen and Pelvis. Left lung appears negative. Stable visible mediastinal contours. Calcified aortic atherosclerosis.  Visualized tracheal air column is within normal limits. No pneumothorax or pulmonary edema. Right abdominal drainage catheter redemonstrated. Paucity of bowel gas. Stable visualized osseous structures. IMPRESSION: 1. Ongoing moderate to large right pleural effusion, as seen on recent CT Abdomen and Pelvis. 2. No new cardiopulmonary abnormality. Electronically Signed   By: VEAR Hurst M.D.   On: 04/09/2023 08:16     LOS: 12 days   Total time spent in review of labs and imaging, patient evaluation, formulation of plan, documentation and communication with family: 50 minutes  Mennie LAMY, MD  [facility]  04/10/2023, 1:15 PM

## 2023-04-11 ENCOUNTER — Other Ambulatory Visit: Payer: Self-pay

## 2023-04-11 DIAGNOSIS — Z7189 Other specified counseling: Secondary | ICD-10-CM | POA: Diagnosis not present

## 2023-04-11 DIAGNOSIS — Z515 Encounter for palliative care: Secondary | ICD-10-CM | POA: Diagnosis not present

## 2023-04-11 DIAGNOSIS — R531 Weakness: Secondary | ICD-10-CM | POA: Diagnosis not present

## 2023-04-11 DIAGNOSIS — R188 Other ascites: Secondary | ICD-10-CM | POA: Diagnosis not present

## 2023-04-11 LAB — CBC
HCT: 22.8 % — ABNORMAL LOW (ref 36.0–46.0)
Hemoglobin: 7 g/dL — ABNORMAL LOW (ref 12.0–15.0)
MCH: 23.1 pg — ABNORMAL LOW (ref 26.0–34.0)
MCHC: 30.7 g/dL (ref 30.0–36.0)
MCV: 75.2 fL — ABNORMAL LOW (ref 80.0–100.0)
Platelets: 552 10*3/uL — ABNORMAL HIGH (ref 150–400)
RBC: 3.03 MIL/uL — ABNORMAL LOW (ref 3.87–5.11)
RDW: 20 % — ABNORMAL HIGH (ref 11.5–15.5)
WBC: 8.6 10*3/uL (ref 4.0–10.5)
nRBC: 0 % (ref 0.0–0.2)

## 2023-04-11 LAB — PREPARE RBC (CROSSMATCH)

## 2023-04-11 LAB — BASIC METABOLIC PANEL
Anion gap: 7 (ref 5–15)
BUN: <5 mg/dL — ABNORMAL LOW (ref 8–23)
CO2: 25 mmol/L (ref 22–32)
Calcium: 7.2 mg/dL — ABNORMAL LOW (ref 8.9–10.3)
Chloride: 103 mmol/L (ref 98–111)
Creatinine, Ser: 0.76 mg/dL (ref 0.44–1.00)
GFR, Estimated: >60 mL/min (ref >60)
Glucose, Bld: 167 mg/dL — ABNORMAL HIGH (ref 70–99)
Potassium: 3 mmol/L — ABNORMAL LOW (ref 3.5–5.1)
Sodium: 135 mmol/L (ref 135–145)

## 2023-04-11 LAB — HEMOGLOBIN AND HEMATOCRIT, BLOOD
HCT: 24.7 % — ABNORMAL LOW (ref 36.0–46.0)
Hemoglobin: 7.6 g/dL — ABNORMAL LOW (ref 12.0–15.0)

## 2023-04-11 LAB — GLUCOSE, CAPILLARY
Glucose-Capillary: 161 mg/dL — ABNORMAL HIGH (ref 70–99)
Glucose-Capillary: 140 mg/dL — ABNORMAL HIGH (ref 70–99)
Glucose-Capillary: 217 mg/dL — ABNORMAL HIGH (ref 70–99)

## 2023-04-11 MED ORDER — POTASSIUM CHLORIDE CRYS ER 20 MEQ PO TBCR
40.0000 meq | EXTENDED_RELEASE_TABLET | ORAL | Status: AC
  Administered 2023-04-11 (×2): 40 meq via ORAL
  Filled 2023-04-11 (×2): qty 2

## 2023-04-11 MED ORDER — POTASSIUM CHLORIDE 10 MEQ/100ML IV SOLN
10.0000 meq | INTRAVENOUS | Status: AC
Start: 2023-04-11 — End: 2023-04-11
  Administered 2023-04-11: 10 meq via INTRAVENOUS
  Filled 2023-04-11 (×2): qty 100

## 2023-04-11 MED ORDER — FUROSEMIDE 10 MG/ML IJ SOLN: 20.0000 mg | Freq: Once | INTRAMUSCULAR | Status: DC

## 2023-04-11 MED ORDER — SODIUM CHLORIDE 0.9 % IV SOLN
3.0000 g | Freq: Four times a day (QID) | INTRAVENOUS | Status: DC
  Administered 2023-04-11 – 2023-04-14 (×11): 3 g via INTRAVENOUS
  Filled 2023-04-11 (×12): qty 8

## 2023-04-11 MED ORDER — SODIUM CHLORIDE 0.9% IV SOLUTION: Freq: Once | INTRAVENOUS | Status: AC

## 2023-04-11 MED ORDER — SODIUM CHLORIDE 0.9% FLUSH
10.0000 mL | Freq: Two times a day (BID) | INTRAVENOUS | Status: DC
  Administered 2023-04-12: 10 mL
  Administered 2023-04-12: 20 mL
  Administered 2023-04-13 – 2023-04-14 (×3): 10 mL

## 2023-04-11 MED ORDER — SODIUM CHLORIDE 0.9% FLUSH: 10.0000 mL | INTRAVENOUS | Status: DC | PRN

## 2023-04-11 NOTE — Progress Notes (Signed)
  Daily Progress Note   Patient Name: MOHOGANY TOPPINS       Date: 04/11/2023 DOB: February 19, 1934  Age: 89 y.o. MRN#: [id number] Attending Physician: Christobal Guadalajara, MD Primary Care Physician: System, Provider Not In Admit Date: 03/28/2023 Length of Stay: 13 days  Reason for Consultation/Follow-up: Establishing goals of care  Subjective:   CC: Patient sitting up in bed comfortably, able to feed herself breakfast, has O2 [place], but not in resp distress at the time of my visit, able to speak in full sentences, CT chest noted.    Following up regarding complex medical decision making.  Subjective:  Reviewed EMR prior to presenting to bedside.  Discussed care with hospitalist   for updates.    Objective:   Vital Signs:  BP (!) 142/73 (BP Location: Right Arm)   Pulse 67   Temp (!) 97.5 F (36.4 C) (Oral)   Resp 18   Wt 80.3 kg   SpO2 100%   BMI 28.57 kg/m   Physical Exam: General: NAD, resting comfortably, chronically ill appearing  Cardiovascular: RRR Respiratory: no increased work of breathing noted, not in respiratory distress  Imaging: I personally reviewed recent imaging.   Assessment & Plan:   Assessment: Patient is an 89 year old female with a past medical history of dementia, hypertension, GERD, and diabetes who was admitted on 03/28/2023 for management of right-sided flank pain. Since admission, patient had imaging which demonstrated a circumferential mass in the distal ascending colon near the hepatic flexure measuring 7 cm and also on enhancing complex collection in the right perirenal space measuring 6 cm with concern for tumor or metastatic disease. IR consulted for evaluation. Palliative medicine team consulted to assist with complex medical decision making.   Recommendations/Plan: # Complex medical decision making/goals of care:   -Patient awaiting SNF rehab with palliative, once medically maximized.   -Discussed care with [facility] MD.   TOC involved to assist with discharge  planning.   Code Status: Limited: Do not attempt resuscitation (DNR) -DNR-LIMITED -Do Not Intubate/DNI   # Psychosocial Support:  - Support System: sons, niece, grand son.   # Discharge Planning: SNF rehab with palliative, not residential hospice appropriate at present.   Thank you for allowing the palliative care team to participate in the care Brette H Croft.  Mod MDM Lonia Serve MD Palliative Care Provider PMT # [phone]  If patient remains symptomatic despite maximum doses, please call PMT at [phone] between 0700 and 1900. Outside of these hours, please call attending, as PMT does not have night coverage.

## 2023-04-11 NOTE — Plan of Care (Signed)

## 2023-04-11 NOTE — Progress Notes (Signed)
 Pt loss IV access x 2 today. Dr. Bobbetta Burnet aware blood transfusion incomplete only received half the blood bag. IV team notified. See MD's new orders.

## 2023-04-11 NOTE — Progress Notes (Signed)
 PROGRESS NOTE Helen Richardson  FMW:[id number] DOB: [birth year] DOA: 03/28/2023 PCP: System, Provider Not In  Brief Narrative/Hospital Course: 89 yr old woman who presented to [facility] ED on 03/28/2023 with complaints of right sided flank pain. The patient had had a previous admission in November 2024 for AMS after being found unresponsive. She was found to be hypoglycemic on that occasion. She gradually improved, and was discharged to outpatient follow-up. The patient states that she developed right sided flank pain and high glucoses in the past few days. Upon presentation her glucose was 629.She also had a WBC of 18, Hgb 8.8. and sodium 125. CT abdomen and pelvis which demonstrated a circumferential mass in the distal ascending colon near the hepatic flexure measuring 7 cm. There is also a enhancing complex collection in the right perirenal space measuring 6 cm with concern for tumor or metastatic disease. IR was consulted in the ED for possible drain placement versus biopsy, AND admitted. The patient has been evaluated by IR. As the patient herself is not capable of expressing herself, following commands, or making her own decisions. Her son was at bedside and drain was placed 1/28. Palliative care has been consulted.  Subsequently, milligram by worsening leukocytosis then proceeded to decline. On 04/02/2023 Palliative care had a family meeting: Family decided not to pursue a biopsy, wished for the patient to work with PT and perhaps go on to rehab. They might want to revisit biopsy in the future. ID was consulted and felt she is not appropriate for long term antibiotics.They feel that she is better suited for oral antibiotics.  2/1 had a discussion with the patient's sons regarding her diagnosis, prognosis, and her code status. She is having pain from the drain site. The drain will not be coming out unless she undergoes surgery for the colon mass. This will be a very big surgery and a difficult one for her to  recover from. She is quite weak and debilitated as it is. The alternative would be to make the patient comfort care only.  The sons report that the patient has actually signed a document stating that she does not want any artificial measures to extend her life, family encouraged to discuss about CODE STATUS. 2/2: Elevated CBG, her D10 - discontinued.Family is still not very decisive about taking her back home with hospice and also does not want any extensive procedure. Prior drain cultures with pansensitive E. coli, Streptococcus Anginosis and bacteroid> strep angionsus is sensitive to penicillin, okay to keep Unasyn  and changed to Augmentin  upon discharge per ID. 2/3: episode of hypoglycemia as p.o. intake remained poor- dscontinued Semglee , IR repositioned  drain. Palliative care had further meeting and plan was discharged home with hospice care and family were agreeable.  But subsequently family informed that they do not have 24/7 caregiver at home so unable to go home with hospice and they want to pursue skilled nursing facility and have palliative care follow-up.  With her bacteremia and intra-abdominal fluid collection abscess plan is to discharge on oral Augmentin  Patient having some cough and also ongoing shortness of breath needing supplemental oxygen Further chest x-ray showed pleural effusion IR consulted but unable to tap as fluid is too small and loculated CTA chest 2/7> no PE but large right and small left layering pleural effusion with compressive atelectasis/compressive collapse of right lower lobe mild shift of the heart over to the left, thickening of the colon on the splenic flexure, ascending colon mass and small tail hepatic ascites and  mild anasarca noticed.  Patient was given  IV Lasix  and home Lasix  resumed as well   Subjective: Seen this am No shortness of breath appears comfortable Overnight afebrile, labs with hypokalemia 3.0 hemoglobin drifting 7.0  On 3l [place] Just had a  bowel movement She was eating breakfast Spoke with grandson on the phone  Assessment and Plan: Principal Problem:   Intraabdominal fluid collection Active Problems:   Palliative care encounter   Colonic mass   Goals of care, counseling/discussion   Counseling and coordination of care   Need for emotional support   Bacteremia   DNR (do not resuscitate) discussion   Intra-abdominal fluid collection-abscess Colonic Mass Flank pain likely from malignancy/mass: Intra-abdominal fluid collection/abscess in the setting of colon mass, not a candidate for surgery subsequently underwent IR placed drainage and drain fluid with E. coli Streptococcus and bacteroids, also bacteremic.  Seen by ID, continue on IV antibiotics Unasyn  while here> on discharge will do Augmentin  x 30 days discussed with ID.  Concerned about likely metastatic disease or locally advanced colon cancer, no prior colonoscopy palliative care has seen the patient -initial plan was home with hospice but at this time pursuing SNF due to lack of 24/7 care at home    Acute hypoxic resp failure: Large right and small left layering pleural effusion with compressive atelectasis/compressive collapse of the right lower lobe with mild shifT: Anasarca/fluid overload: BNP of 369, with weight gain, given IV Lasix  and resume home Lasix .  IR consulted for thoracentesis on 2/6 but unable to see much fluid to tap on ultrasound. CT obtained 2/7 see result above no PE, continue supplemental oxygen, Lasix  po and monitor electrolytes.  Bacteremia with E faecalis-likely intra-abdominal source with abscess Leukocytosis: Prior drain cultures with pansensitive E. coli, Streptococcus Anginosis and bacteroid> strep angionsus is sensitive to penicillin, okay to keep Unasyn  and to change to Augmentin  upon discharge per ID x 30 days.Repeat blood cultures have been negative, echocardiogram was suboptimal but did not show any evidence of endocarditis per ID she  would not be appropriate for long-term IV antibiotics.  Continue probiotics for diarrhea monitor counts Initially leukocytosis of 26k-resolved as below and afebrile Recent Labs  Lab 04/05/23 0512 04/09/23 1544 04/10/23 1107 04/11/23 0614  WBC 20.4* 10.3 10.2 8.6    Dementia Debilitated/deconditioning: At times bit lethargic remains weak deconditioned continue supportive care fall precaution delirium precaution    CKD stage IIIa Hypokalemia: creatinine 1.2 with poor oral intake, varying from 0.7-1.2.  Replaced potassium aggressively this morning. Recent Labs    03/29/23 0401 03/30/23 0424 03/31/23 0518 04/01/23 9491 04/02/23 0455 04/04/23 0533 04/05/23 0512 04/09/23 1544 04/10/23 1107 04/11/23 0614  BUN 19 14 15 12 10 10 9  <5* <5* <5*  CREATININE 0.75 0.90 0.75 1.13* 1.00 1.28* 1.20* 0.92 0.82 0.76  CO2 26 25 25 22 24 23  21* 23 24 25   K 4.4 4.4 4.0 4.6 3.6 3.8 3.9 3.3* 3.3* 3.0*    Hyponatremia mild: Likely with poor oral intake and?  SIADH   Anemia of chronic disease likely from malignancy: Hemoglobin drifting further she does appear symptomatic and short of breath.  Discussed with POA grandson over the phone with Witnessed by nursing staff  and grandson wants to pursue transfusion of 1 unit PRBC, will do Lasix  x 1 posttransfusion .Previously had blood transfusion back in November 2024.  Recent Labs  Lab 04/05/23 0512 04/09/23 1544 04/10/23 1107 04/11/23 0614  HGB 7.5* 7.9* 7.7* 7.0*  HCT 23.4* 26.2* 26.1*  22.8*     Essential hypertension: BP is stable. NOT ON MEDS   GERD: On PPI   Type 2 diabetes mellitus with uncontrolled Hyperglycemia  Episodes of hypoglycemia due to poor oral intake: Blood sugar remains well-controlled, mild hypoglycemia of 69 long-acting insulin  has been discontinued, on SSI, encourage oral intake Recent Labs  Lab 04/10/23 1135 04/10/23 1515 04/10/23 2150 04/10/23 2215 04/11/23 0722  GLUCAP 111* 83 69* 77 161*    Goals of  care: Patient has been changed to DNR limited, extensive meeting were held with palliative care and the family in the light of her colon mass likely malignancy with dementia deconditioning debility, initial plan was discharged home with hospice but at this time family unable to provide 24/7 care at home and they would like to proceed with patient going into rehab with palliative care following as outpatient.    DVT prophylaxis: enoxaparin  (LOVENOX ) injection 40 mg Start: 04/01/23 1000 SCDs Start: 03/29/23 0034 Code Status:   Code Status: Limited: Do not attempt resuscitation (DNR) -DNR-LIMITED -Do Not Intubate/DNI  Family Communication: plan of care discussed with patient/none at bedside.  Updated patient's grandson POA 2/6 and also on 2/7 and 2/8  Patient status is: Remains hospitalized because of severity of illness. Level of care: Med-Surg.   Dispo: The patient is from:Home.            Anticipated disposition: SNF over the weekend if stable   Objective: Vitals last 24 hrs: Vitals:   04/10/23 1329 04/10/23 1612 04/10/23 2013 04/11/23 0609  BP: (!) 153/84  139/79 (!) 142/73  Pulse: 75  88 67  Resp: 16  16 18   Temp: 98 F (36.7 C)   (!) 97.5 F (36.4 C)  TempSrc: Oral   Oral  SpO2: 99% 92% 91% 100%  Weight:      Weight change:   Physical Examination: General exam: alert awake, pleasantly confused-but oriented to place people situation HEENT:Oral mucosa moist, Ear/Nose WNL grossly Respiratory system: Bilaterally clear BS,no use of accessory muscle Cardiovascular system: S1 & S2 +, No JVD. Gastrointestinal system: Abdomen soft,NT,ND, abd drain+ BS+ Nervous System: Alert, awake, moving all extremities,and following commands. Extremities: LE edema neg,distal peripheral pulses palpable and warm.  Skin: No rashes,no icterus. MSK: thin muscle bulk,tone, power   Medications reviewed:  Scheduled Meds:  enoxaparin  (LOVENOX ) injection  40 mg Subcutaneous Q24H   feeding supplement  237  mL Oral BID BM   furosemide   40 mg Oral Daily   insulin  aspart  0-9 Units Subcutaneous TID WC   pantoprazole   40 mg Oral BID   potassium chloride   40 mEq Oral Q3H   sodium chloride  flush  5 mL Intracatheter Q8H  Continuous Infusions:  ampicillin -sulbactam (UNASYN ) IV 3 g (04/11/23 0944)   potassium chloride       Diet Order             Diet Carb Modified Fluid consistency: Thin; Room service appropriate? Yes  Diet effective now                  Intake/Output Summary (Last 24 hours) at 04/11/2023 1023 Last data filed at 04/11/2023 9379 Gross per 24 hour  Intake 1489.56 ml  Output 255 ml  Net 1234.56 ml   Net IO Since Admission: 11,319.43 mL [04/11/23 1023]  Wt Readings from Last 3 Encounters:  04/09/23 80.3 kg  01/17/23 76.1 kg  11/19/22 79.8 kg     Unresulted Labs (From admission, onward)     Start  Ordered   04/11/23 0820  Type and screen [facility]  Once,   R       Comments: [facility]    04/11/23 0819   04/11/23 0500  Basic metabolic panel  Daily,   R      04/10/23 1043   04/11/23 0500  CBC  Daily,   R      04/10/23 1043          Data Reviewed: I have personally reviewed following labs and imaging studies CBC: Recent Labs  Lab 04/05/23 0512 04/09/23 1544 04/10/23 1107 04/11/23 0614  WBC 20.4* 10.3 10.2 8.6  NEUTROABS 15.9*  --   --   --   HGB 7.5* 7.9* 7.7* 7.0*  HCT 23.4* 26.2* 26.1* 22.8*  MCV 74.1* 77.1* 78.1* 75.2*  PLT PLATELET CLUMPS NOTED ON SMEAR, COUNT APPEARS ADEQUATE 528* 534* 552*   Basic Metabolic Panel:  Recent Labs  Lab 04/05/23 0512 04/09/23 1544 04/10/23 1107 04/11/23 0614  NA 132* 138 138 135  K 3.9 3.3* 3.3* 3.0*  CL 102 104 104 103  CO2 21* 23 24 25   GLUCOSE 219* 155* 151* 167*  BUN 9 <5* <5* <5*  CREATININE 1.20* 0.92 0.82 0.76  CALCIUM  7.5* 7.3* 7.5* 7.2*   GFR: Estimated Creatinine Clearance: 52 mL/min (by C-G formula based on SCr of 0.76 mg/dL). Liver Function Tests:   Recent Labs  Lab 04/10/23 1135 04/10/23 1515 04/10/23 2150 04/10/23 2215 04/11/23 0722  GLUCAP 111* 83 69* 77 161*   No results for input(s): CHOL, HDL, LDLCALC, TRIG, CHOLHDL, LDLDIRECT in the last 72 hours. No results for input(s): TSH, T4TOTAL, FREET4, T3FREE, THYROIDAB in the last 72 hours. Sepsis Labs: No results for input(s): PROCALCITON, LATICACIDVEN in the last 168 hours. No results found for this or any previous visit (from the past 240 hours).   Antimicrobials/Microbiology: Anti-infectives (From admission, onward)    Start     Dose/Rate Route Frequency Ordered Stop   04/08/23 0000  amoxicillin -clavulanate (AUGMENTIN ) 875-125 MG tablet        1 tablet Oral 2 times daily 04/08/23 1152     03/31/23 1600  Ampicillin -Sulbactam (UNASYN ) 3 g in sodium chloride  0.9 % 100 mL IVPB        3 g 200 mL/hr over 30 Minutes Intravenous Every 6 hours 03/31/23 1451     03/30/23 2300  ampicillin  (OMNIPEN) 2 g in sodium chloride  0.9 % 100 mL IVPB  Status:  Discontinued        2 g 300 mL/hr over 20 Minutes Intravenous Every 6 hours 03/30/23 2204 03/31/23 1451   03/29/23 1000  ceFEPIme (MAXIPIME) 2 g in sodium chloride  0.9 % 100 mL IVPB  Status:  Discontinued        2 g 200 mL/hr over 30 Minutes Intravenous Every 12 hours 03/28/23 2348 03/29/23 0040   03/29/23 0000  vancomycin  (VANCOCIN ) IVPB 1000 mg/200 mL premix  Status:  Discontinued        1,000 mg 200 mL/hr over 60 Minutes Intravenous  Once 03/28/23 2348 03/29/23 0040   03/28/23 2215  cefTRIAXone  (ROCEPHIN ) 2 g in sodium chloride  0.9 % 100 mL IVPB        2 g 200 mL/hr over 30 Minutes Intravenous  Once 03/28/23 2203 03/29/23 0002         Component Value Date/Time   SDES  03/31/2023 1734    BLOOD BLOOD LEFT HAND Performed at [facility], [address] [place], [place] [address]    SPECREQUEST  03/31/2023 1734    BOTTLES DRAWN AEROBIC ONLY Blood Culture results may not be optimal  due to an inadequate volume of blood received in culture bottles Performed at [facility], [address], [place], [place] [address]    CULT  03/31/2023 1734    NO GROWTH 5 DAYS Performed at [facility], [address], [place], [place] [address]    REPTSTATUS 04/05/2023 FINAL 03/31/2023 1734     Radiology Studies: CT Angio Chest Pulmonary Embolism (PE) W or WO Contrast Result Date: 04/10/2023 CLINICAL DATA:  Acute hypoxic respiratory failure. Patient diagnosed last month with right-sided colon cancer complicated by perforation and right retroperitoneal abscess treated with percutaneous drainage. Now presents with large right pleural effusion. No safe window for ultrasound-guided thoracentesis was found on chest ultrasound yesterday. EXAM: CT ANGIOGRAPHY CHEST WITH CONTRAST TECHNIQUE: Multidetector CT imaging of the chest was performed using the standard protocol during bolus administration of intravenous contrast. Multiplanar CT image reconstructions and MIPs were obtained to evaluate the vascular anatomy. RADIATION DOSE REDUCTION: This exam was performed according to the departmental dose-optimization program which includes automated exposure control, adjustment of the mA and/or kV according to patient size and/or use of iterative reconstruction technique. CONTRAST:  75mL OMNIPAQUE  IOHEXOL  350 MG/ML SOLN COMPARISON:  CT abdomen and pelvis with IV contrast prior to percutaneous right abdominal abscess drainage dated 03/28/2023, portable chest yesterday at 7:51 a.m., and portable chest 01/16/2023. No prior chest dedicated CT. FINDINGS: Cardiovascular: The heart is mildly shifted over to the left due to a large right pleural effusion. The cardiac size is normal. There are patchy two-vessel coronary calcifications in the LAD and circumflex. There is no substantial pericardial effusion. The pulmonary trunk and main arteries are upper normal in caliber. No arterial embolism is  seen. Pulmonary veins are nondistended. There patchy aortic calcific plaques, mild aortic tortuosity without aneurysm, stenosis or dissection. There are occasional calcific plaques in the great vessels. The great vessels are widely patent. There is a two-vessel aortic arch with normal variant short-segment brachiobicarotid trunk. Mediastinum/Nodes: No enlarged mediastinal, hilar, or axillary lymph nodes. Thyroid  gland, trachea, and esophagus demonstrate no significant findings. Lungs/Pleura: Chronically elevated right hemidiaphragm. New from 03/28/2023, there is a large right and small left layering pleural effusions. The pleural fluid is low in density and does not have the appearance of hemorrhage. No pleural-based nodularity is seen. On the right the effusion collapses the lower lobe and portions of the posterior upper and middle lobes. On the left there is mild compressive atelectatic effect in the posterior lower lobe. Aerated portions of the lungs are clear. Consider follow-up chest CT after resolution or removal of the effusions to fully assess the lung fields. There is no pneumothorax. Upper Abdomen: A percutaneous drain in the subhepatic space is partially visible. Concern for thickening of the wall of the splenic flexure only partially seen. Correlate clinically for colitis. A mass in the ascending colon is also partially visible. There is abdominal aortic atherosclerosis. Small volume of perihepatic ascites. Musculoskeletal: There are degenerative changes of the spine shoulders. No destructive bone lesions. Mild thoracic levoscoliosis. The ribcage is intact. Mild anasarca lower chest wall. Review of the MIP images confirms the above findings. IMPRESSION: 1. No evidence of arterial embolus. 2. Large right and small left layering pleural effusions with compressive atelectatic effect in the the adjacent lungs, compressive collapse right lower lobe, and mild shift of the heart  over to the left. Consider  follow-up chest CT after resolution or removal of the effusions to fully assess the lung fields. 3. Aortic and coronary artery atherosclerosis. 4. Percutaneous drain in the subhepatic space. 5. Concern for thickening of the wall of the splenic flexure only partially seen. Correlate clinically for colitis. 6. Ascending colon mass partially visible. 7. Small volume of perihepatic ascites. 8. Mild anasarca. Aortic Atherosclerosis (ICD10-I70.0). Electronically Signed   By: Francis Quam M.D.   On: 04/10/2023 22:55   US  CHEST (PLEURAL EFFUSION) Result Date: 04/09/2023 INDICATION: 89 year old female presents with shortness of breath. Previous imaging showed right pleural effusion. Request for therapeutic and diagnostic thoracentesis. EXAM: CHEST ULTRASOUND COMPARISON:  None Available. FINDINGS: Bilateral pleural space visualized with ultrasound. No pleural effusion in the left pleural cavity. Small pleural effusion in the right pleural cavity with loculation. No safe window for thoracentesis found. IMPRESSION: Small right pleural effusion with loculation without safe window for thoracentesis. Thoracentesis was not performed. Performed by: Aimee Han, PA-C Electronically Signed   By: Marcey Moan M.D.   On: 04/09/2023 16:42   LOS: 13 days  Total time spent in review of labs and imaging, patient evaluation, formulation of plan, documentation and communication with family: 35 minutes.  Mennie LAMY, MD  [facility]  04/11/2023, 10:23 AM

## 2023-04-11 NOTE — Progress Notes (Signed)
 Dr Bobbetta Burnet aware pt having red stools. H&H ordered.

## 2023-04-11 NOTE — Plan of Care (Signed)
   Problem: Nutrition: Goal: Adequate nutrition will be maintained Outcome: Progressing   Problem: Coping: Goal: Level of anxiety will decrease Outcome: Progressing

## 2023-04-12 DIAGNOSIS — R188 Other ascites: Secondary | ICD-10-CM | POA: Diagnosis not present

## 2023-04-12 LAB — CBC
HCT: 20.7 % — ABNORMAL LOW (ref 36.0–46.0)
Hemoglobin: 6.4 g/dL — CL (ref 12.0–15.0)
MCH: 23.4 pg — ABNORMAL LOW (ref 26.0–34.0)
MCHC: 30.9 g/dL (ref 30.0–36.0)
MCV: 75.8 fL — ABNORMAL LOW (ref 80.0–100.0)
Platelets: 539 10*3/uL — ABNORMAL HIGH (ref 150–400)
RBC: 2.73 MIL/uL — ABNORMAL LOW (ref 3.87–5.11)
RDW: 20.7 % — ABNORMAL HIGH (ref 11.5–15.5)
WBC: 9.3 10*3/uL (ref 4.0–10.5)
nRBC: 0 % (ref 0.0–0.2)

## 2023-04-12 LAB — BASIC METABOLIC PANEL
Anion gap: 7 (ref 5–15)
BUN: <5 mg/dL — ABNORMAL LOW (ref 8–23)
CO2: 24 mmol/L (ref 22–32)
Calcium: 7.4 mg/dL — ABNORMAL LOW (ref 8.9–10.3)
Chloride: 111 mmol/L (ref 98–111)
Creatinine, Ser: 0.8 mg/dL (ref 0.44–1.00)
GFR, Estimated: >60 mL/min (ref >60)
Glucose, Bld: 108 mg/dL — ABNORMAL HIGH (ref 70–99)
Potassium: 3.2 mmol/L — ABNORMAL LOW (ref 3.5–5.1)
Sodium: 142 mmol/L (ref 135–145)

## 2023-04-12 LAB — GLUCOSE, CAPILLARY
Glucose-Capillary: 101 mg/dL — ABNORMAL HIGH (ref 70–99)
Glucose-Capillary: 173 mg/dL — ABNORMAL HIGH (ref 70–99)
Glucose-Capillary: 111 mg/dL — ABNORMAL HIGH (ref 70–99)
Glucose-Capillary: 140 mg/dL — ABNORMAL HIGH (ref 70–99)

## 2023-04-12 LAB — PREPARE RBC (CROSSMATCH)

## 2023-04-12 MED ORDER — SODIUM CHLORIDE 0.9% IV SOLUTION: Freq: Once | INTRAVENOUS | Status: AC

## 2023-04-12 MED ORDER — SODIUM CHLORIDE 0.9% IV SOLUTION: Freq: Once | INTRAVENOUS | Status: DC

## 2023-04-12 NOTE — Progress Notes (Signed)
 PROGRESS NOTE Helen Richardson  FMW:[id number] DOB: 06-Aug-1933 DOA: 03/28/2023 PCP: System, Provider Not In  Brief Narrative/Hospital Course: 89 yr old woman who presented to [facility] ED on 03/28/2023 with complaints of right sided flank pain. The patient had had a previous admission in November 2024 for AMS after being found unresponsive. She was found to be hypoglycemic on that occasion. She gradually improved, and was discharged to outpatient follow-up. The patient states that she developed right sided flank pain and high glucoses in the past few days. Upon presentation her glucose was 629.She also had a WBC of 18, Hgb 8.8. and sodium 125. CT abdomen and pelvis which demonstrated a circumferential mass in the distal ascending colon near the hepatic flexure measuring 7 cm. There is also a enhancing complex collection in the right perirenal space measuring 6 cm with concern for tumor or metastatic disease. IR was consulted in the ED for possible drain placement versus biopsy, AND admitted. The patient has been evaluated by IR. As the patient herself is not capable of expressing herself, following commands, or making her own decisions. Her son was at bedside and drain was placed 1/28. Palliative care has been consulted.  Subsequently, milligram by worsening leukocytosis then proceeded to decline. On 04/02/2023 Palliative care had a family meeting: Family decided not to pursue a biopsy, wished for the patient to work with PT and perhaps go on to rehab. They might want to revisit biopsy in the future. ID was consulted and felt she is not appropriate for long term antibiotics.They feel that she is better suited for oral antibiotics.  2/1 had a discussion with the patient's sons regarding her diagnosis, prognosis, and her code status. She is having pain from the drain site. The drain will not be coming out unless she undergoes surgery for the colon mass. This will be a very big surgery and a difficult one for her to  recover from. She is quite weak and debilitated as it is. The alternative would be to make the patient comfort care only.  The sons report that the patient has actually signed a document stating that she does not want any artificial measures to extend her life, family encouraged to discuss about CODE STATUS. 2/2: Elevated CBG, her D10 - discontinued.Family is still not very decisive about taking her back home with hospice and also does not want any extensive procedure. Prior drain cultures with pansensitive E. coli, Streptococcus Anginosis and bacteroid> strep angionsus is sensitive to penicillin, okay to keep Unasyn  and changed to Augmentin  upon discharge per ID. 2/3: episode of hypoglycemia as p.o. intake remained poor- dscontinued Semglee , IR repositioned  drain. Palliative care had further meeting and plan was discharged home with hospice care and family were agreeable.  But subsequently family informed that they do not have 24/7 caregiver at home so unable to go home with hospice and they want to pursue skilled nursing facility and have palliative care follow-up.  With her bacteremia and intra-abdominal fluid collection abscess plan is to discharge on oral Augmentin  Patient having some cough and also ongoing shortness of breath needing supplemental oxygen Further chest x-ray showed pleural effusion IR consulted but unable to tap as fluid is too small and loculated CTA chest 2/7> no PE but large right and small left layering pleural effusion with compressive atelectasis/compressive collapse of right lower lobe mild shift of the heart over to the left, thickening of the colon on the splenic flexure, ascending colon mass and small tail hepatic ascites and  mild anasarca noticed.  Patient was given  IV Lasix  and home Lasix  resumed as well 04/11/23 1 unit transfusion ordered but only received half due to IV line coming ou, unable to replace IV line but subsequently midline was placed by PICC> hemoglobin came  up initially 7.6 but again overnight down to 6.4 additional 1 unit PRBC ordered   Subjective: Seen this morning son at the bedside Patient appears alert awake no complaint  1 unit PRBCs order has been placed overnight for hemoglobin 6.4 g and waiting for transfusion Vitals remained stable otherwise  She had red-colored stool yesterday  fobt pending  Assessment and Plan: Principal Problem:   Intraabdominal fluid collection Active Problems:   Palliative care by specialist   Colonic mass   Goals of care, counseling/discussion   Counseling and coordination of care   Need for emotional support   Bacteremia   DNR (do not resuscitate) discussion   General weakness   Intra-abdominal fluid collection-abscess Colonic Mass w/Abdominal pain Bacteremia with E faecalis-likely intra-abdominal source with abscess Leukocytosis:: Intra-abdominal fluid collection/abscess in the setting of colon mass, not a candidate for surgery subsequently underwent IR placed drainage and drain fluid with E. coli Streptococcus and bacteroids, also bacteremic. Seen by ID-drain cultures with pansensitive E. coli, Streptococcus Anginosis and bacteroided> strep angionsus is sensitive to penicillin, okay to keep Unasyn  and to change to Augmentin  upon discharge per ID x 30 days.Repeat blood cultures  NGTD TTE suboptimal but did not show any evidence of endocarditis per ID she would not be appropriate for long-term IV antibiotics.  Initially leukocytosis of 26k-resolved Concern for likely metastatic disease or locally advanced colon cancer, no prior colonoscopy-palliative care consulted initially plan was home with hospice not pursuing SNF due to lack of 24/7 care at home.    Acute hypoxic resp failure: Large right and small left layering pleural effusion with compressive atelectasis/compressive collapse of the right lower lobe with mild shifT: Anasarca/fluid overload: BNP of 369, with weight gain, given IV Lasix  and  resume home Lasix .  IR consulted for thoracentesis on 2/6 but unable to see much fluid to tap on ultrasound. CT obtained 2/7 see result above no PE, continue supplemental oxygen, Lasix  po and monitor electrolytes. Respiratory status stable on 2 L and will plan for home o2  Dementia Debilitated/deconditioning: At times bit lethargic remains weak deconditioned continue supportive care fall precaution delirium precaution    CKD stage IIIa Hypokalemia: creatinine 1.2 with poor oral intake, varying from 0.7-1.2.  Hypokalemia replaced Recent Labs    03/30/23 0424 03/31/23 0518 04/01/23 0508 04/02/23 0455 04/04/23 0533 04/05/23 0512 04/09/23 1544 04/10/23 1107 04/11/23 0614 04/12/23 0323  BUN 14 15 12 10 10 9  <5* <5* <5* <5*  CREATININE 0.90 0.75 1.13* 1.00 1.28* 1.20* 0.92 0.82 0.76 0.80  CO2 25 25 22 24 23  21* 23 24 25 24   K 4.4 4.0 4.6 3.6 3.8 3.9 3.3* 3.3* 3.0* 3.2*    Hyponatremia mild: Likely with poor oral intake and?  SIADH   Anemia of chronic disease likely from malignancy: Hemoglobin drifting further she does appear symptomatic and short of breath.  04/11/23 1 unit transfusion ordered but only received half due to IV line coming ou, unable to replace IV line but subsequently midline was placed by PICC> hemoglobin came up initially 7.6 but again overnight down to 6.4 additional 1 unit PRBC ordered-pending transfusion FOBT ordered for RED STOOL. Recent Labs  Lab 04/09/23 1544 04/10/23 1107 04/11/23 9385 04/11/23 1859 04/12/23 9676  HGB 7.9* 7.7* 7.0* 7.6* 6.4*  HCT 26.2* 26.1* 22.8* 24.7* 20.7*     Essential hypertension: BP is stable. NOT ON MEDS   GERD: On PPI   Type 2 diabetes mellitus with uncontrolled Hyperglycemia  Episodes of hypoglycemia due to poor oral intake: Blood sugar remains well-controlled, mild hypoglycemia of 69 long-acting insulin  has been discontinued, on SSI, encourage oral intake Recent Labs  Lab 04/10/23 2215 04/11/23 0722 04/11/23 1132  04/11/23 1733 04/12/23 0728  GLUCAP 77 161* 140* 217* 101*    Goals of care: Patient has been changed to DNR limited, extensive meeting were held with palliative care and the family in the light of her colon mass likely malignancy with dementia deconditioning debility, initial plan was discharged home with hospice but at this time family unable to provide 24/7 care at home and they would like to proceed with patient going into rehab with palliative care following as outpatient.    DVT prophylaxis: Place and maintain sequential compression device Start: 04/12/23 0629 SCDs Start: 03/29/23 0034 Code Status:   Code Status: Limited: Do not attempt resuscitation (DNR) -DNR-LIMITED -Do Not Intubate/DNI  Family Communication: plan of care discussed with patient/none at bedside.  Updated patient's grandson POA 2/6 and also on 2/7 and 2/8.  I discussed and updated patient's son at the bedside 2/9  Patient status is: Remains hospitalized because of severity of illness. Level of care: Med-Surg.   Dispo: The patient is from:Home.            Anticipated disposition: SNF once hemoglobin stabilizes  Objective: Vitals last 24 hrs: Vitals:   04/11/23 2131 04/12/23 0459 04/12/23 0928 04/12/23 1109  BP: (!) 147/91 129/67 128/80 131/81  Pulse: (!) 105 (!) 101 (!) 105 (!) 102  Resp: 20 20  20   Temp: 98.7 F (37.1 C) 97.9 F (36.6 C) 98.4 F (36.9 C) 98.7 F (37.1 C)  TempSrc: Oral Oral Oral Oral  SpO2: 100% 99% 100% 100%  Weight:      Weight change:   Physical Examination: General exam: alert awake, elderly frail HEENT:Oral mucosa moist, Ear/Nose WNL grossly Respiratory system: Bilaterally clear BS,no use of accessory muscle Cardiovascular system: S1 & S2 +, No JVD. Gastrointestinal system: Abdomen soft, mildly tender,ND, BS+ drain on RLQ + Nervous System: Alert, awake, moving all extremities,and following commands. Extremities: LE edema neg,distal peripheral pulses palpable and warm.  Skin: No  rashes,no icterus. MSK: Normal muscle bulk,tone, power   Medications reviewed:  Scheduled Meds:  sodium chloride    Intravenous Once   feeding supplement  237 mL Oral BID BM   furosemide   40 mg Oral Daily   insulin  aspart  0-9 Units Subcutaneous TID WC   pantoprazole   40 mg Oral BID   sodium chloride  flush  10-40 mL Intracatheter Q12H   sodium chloride  flush  5 mL Intracatheter Q8H  Continuous Infusions:  ampicillin -sulbactam (UNASYN ) IV 3 g (04/12/23 0539)    Diet Order             Diet Carb Modified Fluid consistency: Thin; Room service appropriate? Yes  Diet effective now                  Intake/Output Summary (Last 24 hours) at 04/12/2023 1120 Last data filed at 04/12/2023 0928 Gross per 24 hour  Intake 1420.24 ml  Output 15 ml  Net 1405.24 ml   Net IO Since Admission: 12,724.67 mL [04/12/23 1120]  Wt Readings from Last 3 Encounters:  04/09/23 80.3 kg  01/17/23 76.1 kg  11/19/22 79.8 kg     Unresulted Labs (From admission, onward)     Start     Ordered   04/11/23 0500  Basic metabolic panel  Daily,   R      04/10/23 1043   04/11/23 0500  CBC  Daily,   R      04/10/23 1043   Unscheduled  Occult blood card to lab, stool  As needed,   R      04/12/23 0628          Data Reviewed: I have personally reviewed following labs and imaging studies CBC: Recent Labs  Lab 04/09/23 1544 04/10/23 1107 04/11/23 0614 04/11/23 1859 04/12/23 0323  WBC 10.3 10.2 8.6  --  9.3  HGB 7.9* 7.7* 7.0* 7.6* 6.4*  HCT 26.2* 26.1* 22.8* 24.7* 20.7*  MCV 77.1* 78.1* 75.2*  --  75.8*  PLT 528* 534* 552*  --  539*   Basic Metabolic Panel:  Recent Labs  Lab 04/09/23 1544 04/10/23 1107 04/11/23 0614 04/12/23 0323  NA 138 138 135 142  K 3.3* 3.3* 3.0* 3.2*  CL 104 104 103 111  CO2 23 24 25 24   GLUCOSE 155* 151* 167* 108*  BUN <5* <5* <5* <5*  CREATININE 0.92 0.82 0.76 0.80  CALCIUM  7.3* 7.5* 7.2* 7.4*   GFR: Estimated Creatinine Clearance: 52 mL/min (by C-G formula based  on SCr of 0.8 mg/dL). Liver Function Tests:  Recent Labs  Lab 04/10/23 2215 04/11/23 0722 04/11/23 1132 04/11/23 1733 04/12/23 0728  GLUCAP 77 161* 140* 217* 101*   No results for input(s): CHOL, HDL, LDLCALC, TRIG, CHOLHDL, LDLDIRECT in the last 72 hours. No results for input(s): TSH, T4TOTAL, FREET4, T3FREE, THYROIDAB in the last 72 hours. Sepsis Labs: No results for input(s): PROCALCITON, LATICACIDVEN in the last 168 hours. No results found for this or any previous visit (from the past 240 hours).   Antimicrobials/Microbiology: Anti-infectives (From admission, onward)    Start     Dose/Rate Route Frequency Ordered Stop   04/12/23 0000  Ampicillin -Sulbactam (UNASYN ) 3 g in sodium chloride  0.9 % 100 mL IVPB        3 g 200 mL/hr over 30 Minutes Intravenous Every 6 hours 04/11/23 1831     04/08/23 0000  amoxicillin -clavulanate (AUGMENTIN ) 875-125 MG tablet        1 tablet Oral 2 times daily 04/08/23 1152     03/31/23 1600  Ampicillin -Sulbactam (UNASYN ) 3 g in sodium chloride  0.9 % 100 mL IVPB  Status:  Discontinued        3 g 200 mL/hr over 30 Minutes Intravenous Every 6 hours 03/31/23 1451 04/11/23 1831   03/30/23 2300  ampicillin  (OMNIPEN) 2 g in sodium chloride  0.9 % 100 mL IVPB  Status:  Discontinued        2 g 300 mL/hr over 20 Minutes Intravenous Every 6 hours 03/30/23 2204 03/31/23 1451   03/29/23 1000  ceFEPIme (MAXIPIME) 2 g in sodium chloride  0.9 % 100 mL IVPB  Status:  Discontinued        2 g 200 mL/hr over 30 Minutes Intravenous Every 12 hours 03/28/23 2348 03/29/23 0040   03/29/23 0000  vancomycin  (VANCOCIN ) IVPB 1000 mg/200 mL premix  Status:  Discontinued        1,000 mg 200 mL/hr over 60 Minutes Intravenous  Once 03/28/23 2348 03/29/23 0040   03/28/23 2215  cefTRIAXone  (ROCEPHIN ) 2 g in sodium chloride  0.9 % 100 mL IVPB  2 g 200 mL/hr over 30 Minutes Intravenous  Once 03/28/23 2203 03/29/23 0002         Component Value  Date/Time   SDES  03/31/2023 1734    BLOOD BLOOD LEFT HAND Performed at [facility], [address], [place], [place] [address]    SPECREQUEST  03/31/2023 1734    BOTTLES DRAWN AEROBIC ONLY Blood Culture results may not be optimal due to an inadequate volume of blood received in culture bottles Performed at [facility], [address], [place], [place] [address]    CULT  03/31/2023 1734    NO GROWTH 5 DAYS Performed at [facility], [address], [place], [place] [address]    REPTSTATUS 04/05/2023 FINAL 03/31/2023 1734     Radiology Studies: US  EKG SITE RITE Result Date: 04/11/2023 If Site Rite image not attached, placement could not be confirmed due to current cardiac rhythm.  CT Angio Chest Pulmonary Embolism (PE) W or WO Contrast Result Date: 04/10/2023 CLINICAL DATA:  Acute hypoxic respiratory failure. Patient diagnosed last month with right-sided colon cancer complicated by perforation and right retroperitoneal abscess treated with percutaneous drainage. Now presents with large right pleural effusion. No safe window for ultrasound-guided thoracentesis was found on chest ultrasound yesterday. EXAM: CT ANGIOGRAPHY CHEST WITH CONTRAST TECHNIQUE: Multidetector CT imaging of the chest was performed using the standard protocol during bolus administration of intravenous contrast. Multiplanar CT image reconstructions and MIPs were obtained to evaluate the vascular anatomy. RADIATION DOSE REDUCTION: This exam was performed according to the departmental dose-optimization program which includes automated exposure control, adjustment of the mA and/or kV according to patient size and/or use of iterative reconstruction technique. CONTRAST:  75mL OMNIPAQUE  IOHEXOL  350 MG/ML SOLN COMPARISON:  CT abdomen and pelvis with IV contrast prior to percutaneous right abdominal abscess drainage dated 03/28/2023, portable chest yesterday at 7:51 a.m., and portable chest  01/16/2023. No prior chest dedicated CT. FINDINGS: Cardiovascular: The heart is mildly shifted over to the left due to a large right pleural effusion. The cardiac size is normal. There are patchy two-vessel coronary calcifications in the LAD and circumflex. There is no substantial pericardial effusion. The pulmonary trunk and main arteries are upper normal in caliber. No arterial embolism is seen. Pulmonary veins are nondistended. There patchy aortic calcific plaques, mild aortic tortuosity without aneurysm, stenosis or dissection. There are occasional calcific plaques in the great vessels. The great vessels are widely patent. There is a two-vessel aortic arch with normal variant short-segment brachiobicarotid trunk. Mediastinum/Nodes: No enlarged mediastinal, hilar, or axillary lymph nodes. Thyroid  gland, trachea, and esophagus demonstrate no significant findings. Lungs/Pleura: Chronically elevated right hemidiaphragm. New from 03/28/2023, there is a large right and small left layering pleural effusions. The pleural fluid is low in density and does not have the appearance of hemorrhage. No pleural-based nodularity is seen. On the right the effusion collapses the lower lobe and portions of the posterior upper and middle lobes. On the left there is mild compressive atelectatic effect in the posterior lower lobe. Aerated portions of the lungs are clear. Consider follow-up chest CT after resolution or removal of the effusions to fully assess the lung fields. There is no pneumothorax. Upper Abdomen: A percutaneous drain in the subhepatic space is partially visible. Concern for thickening of the wall of the splenic flexure only partially seen. Correlate clinically for colitis. A mass in the ascending colon is also partially visible. There is abdominal aortic atherosclerosis. Small volume of perihepatic ascites.  Musculoskeletal: There are degenerative changes of the spine shoulders. No destructive bone lesions. Mild  thoracic levoscoliosis. The ribcage is intact. Mild anasarca lower chest wall. Review of the MIP images confirms the above findings. IMPRESSION: 1. No evidence of arterial embolus. 2. Large right and small left layering pleural effusions with compressive atelectatic effect in the the adjacent lungs, compressive collapse right lower lobe, and mild shift of the heart over to the left. Consider follow-up chest CT after resolution or removal of the effusions to fully assess the lung fields. 3. Aortic and coronary artery atherosclerosis. 4. Percutaneous drain in the subhepatic space. 5. Concern for thickening of the wall of the splenic flexure only partially seen. Correlate clinically for colitis. 6. Ascending colon mass partially visible. 7. Small volume of perihepatic ascites. 8. Mild anasarca. Aortic Atherosclerosis (ICD10-I70.0). Electronically Signed   By: Francis Quam M.D.   On: 04/10/2023 22:55   LOS: 14 days  Total time spent in review of labs and imaging, patient evaluation, formulation of plan, documentation and communication with family: 35 minutes.  Mennie LAMY, MD  [facility]  04/12/2023, 11:20 AM

## 2023-04-12 NOTE — Plan of Care (Signed)
  Problem: Pain Managment: Goal: General experience of comfort will improve and/or be controlled Outcome: Progressing   Problem: Health Behavior/Discharge Planning: Goal: Ability to manage health-related needs will improve Outcome: Not Progressing   Problem: Clinical Measurements: Goal: Ability to maintain clinical measurements within normal limits will improve Outcome: Not Progressing Goal: Diagnostic test results will improve Outcome: Not Progressing   Problem: Activity: Goal: Risk for activity intolerance will decrease Outcome: Not Progressing   Problem: Nutrition: Goal: Adequate nutrition will be maintained Outcome: Not Progressing   Problem: Skin Integrity: Goal: Risk for impaired skin integrity will decrease Outcome: Not Progressing

## 2023-04-12 NOTE — TOC Progression Note (Signed)
 Transition of Care [facility]) - Progression Note    Patient Details  Name: Helen Richardson MRN: [id number] Date of Birth: 10/09/1933  Transition of Care [facility]) CM/SW Contact  Sonda Manuella Quill, RN Phone Number: 04/12/2023, 5:52 PM  Clinical Narrative:    Pt not medically ready for d/c; ins auth 04/09/23 - 04/13/23.   Expected Discharge Plan: Skilled Nursing Facility Barriers to Discharge: Continued Medical Work up, English As A Second Language Teacher, SNF Pending bed offer  Expected Discharge Plan and Services In-house Referral: Clinical Social Work   Post Acute Care Choice: Skilled Nursing Facility Living arrangements for the past 2 months: Single Family Home                 DME Arranged: N/A DME Agency: NA                   Social Determinants of Health (SDOH) Interventions SDOH Screenings   Food Insecurity: Patient Unable To Answer (03/30/2023)  Housing: Unknown (03/30/2023)  Transportation Needs: No Transportation Needs (03/30/2023)  Utilities: Not At Risk (03/30/2023)  Alcohol Screen: Low Risk  (10/18/2021)  Depression (PHQ2-9): Low Risk  (10/18/2021)  Financial Resource Strain: Low Risk  (10/18/2021)  Physical Activity: Inactive (10/18/2021)  Social Connections: Patient Unable To Answer (03/30/2023)  Stress: No Stress Concern Present (10/18/2021)  Tobacco Use: Low Risk  (03/29/2023)    Readmission Risk Interventions    04/06/2023    2:55 PM  Readmission Risk Prevention Plan  Transportation Screening Complete  PCP or Specialist Appt within 5-7 Days Complete  Home Care Screening Complete  Medication Review (RN CM) Complete

## 2023-04-12 NOTE — Progress Notes (Signed)

## 2023-04-13 DIAGNOSIS — Z7189 Other specified counseling: Secondary | ICD-10-CM | POA: Diagnosis not present

## 2023-04-13 DIAGNOSIS — Z515 Encounter for palliative care: Secondary | ICD-10-CM | POA: Diagnosis not present

## 2023-04-13 DIAGNOSIS — R188 Other ascites: Secondary | ICD-10-CM | POA: Diagnosis not present

## 2023-04-13 LAB — BPAM RBC
Blood Product Expiration Date: 202503122359
Blood Product Expiration Date: 202503152359
Blood Product Expiration Date: 202503122359
ISSUE DATE / TIME: 202502090923
ISSUE DATE / TIME: 202502091212
ISSUE DATE / TIME: 202502081258
Unit Type and Rh: 7300
Unit Type and Rh: 7300
Unit Type and Rh: 7300

## 2023-04-13 LAB — BASIC METABOLIC PANEL
Anion gap: 7 (ref 5–15)
Anion gap: 7 (ref 5–15)
BUN: <5 mg/dL — ABNORMAL LOW (ref 8–23)
BUN: <5 mg/dL — ABNORMAL LOW (ref 8–23)
CO2: 24 mmol/L (ref 22–32)
CO2: 24 mmol/L (ref 22–32)
Calcium: 7.3 mg/dL — ABNORMAL LOW (ref 8.9–10.3)
Calcium: 7.3 mg/dL — ABNORMAL LOW (ref 8.9–10.3)
Chloride: 107 mmol/L (ref 98–111)
Chloride: 106 mmol/L (ref 98–111)
Creatinine, Ser: 0.89 mg/dL (ref 0.44–1.00)
Creatinine, Ser: 0.82 mg/dL (ref 0.44–1.00)
GFR, Estimated: >60 mL/min (ref >60)
GFR, Estimated: >60 mL/min (ref >60)
Glucose, Bld: 134 mg/dL — ABNORMAL HIGH (ref 70–99)
Glucose, Bld: 126 mg/dL — ABNORMAL HIGH (ref 70–99)
Potassium: 2.9 mmol/L — ABNORMAL LOW (ref 3.5–5.1)
Potassium: 3.3 mmol/L — ABNORMAL LOW (ref 3.5–5.1)
Sodium: 138 mmol/L (ref 135–145)
Sodium: 137 mmol/L (ref 135–145)

## 2023-04-13 LAB — TYPE AND SCREEN
ABO/RH(D): B POS
Antibody Screen: NEGATIVE
Unit division: 0
Unit division: 0
Unit division: 0

## 2023-04-13 LAB — GLUCOSE, CAPILLARY
Glucose-Capillary: 131 mg/dL — ABNORMAL HIGH (ref 70–99)
Glucose-Capillary: 153 mg/dL — ABNORMAL HIGH (ref 70–99)
Glucose-Capillary: 95 mg/dL (ref 70–99)
Glucose-Capillary: 96 mg/dL (ref 70–99)

## 2023-04-13 LAB — CBC
HCT: 25.6 % — ABNORMAL LOW (ref 36.0–46.0)
Hemoglobin: 8.3 g/dL — ABNORMAL LOW (ref 12.0–15.0)
MCH: 25.6 pg — ABNORMAL LOW (ref 26.0–34.0)
MCHC: 32.4 g/dL (ref 30.0–36.0)
MCV: 79 fL — ABNORMAL LOW (ref 80.0–100.0)
Platelets: 523 10*3/uL — ABNORMAL HIGH (ref 150–400)
RBC: 3.24 MIL/uL — ABNORMAL LOW (ref 3.87–5.11)
RDW: 21.3 % — ABNORMAL HIGH (ref 11.5–15.5)
WBC: 10.4 10*3/uL (ref 4.0–10.5)
nRBC: 0 % (ref 0.0–0.2)

## 2023-04-13 MED ORDER — POTASSIUM CHLORIDE CRYS ER 20 MEQ PO TBCR
40.0000 meq | EXTENDED_RELEASE_TABLET | Freq: Once | ORAL | Status: AC
  Administered 2023-04-13: 40 meq via ORAL
  Filled 2023-04-13: qty 2

## 2023-04-13 MED ORDER — GUAIFENESIN-DM 100-10 MG/5ML PO SYRP
5.0000 mL | ORAL_SOLUTION | ORAL | Status: DC | PRN
  Administered 2023-04-13: 5 mL via ORAL
  Filled 2023-04-13: qty 10

## 2023-04-13 MED ORDER — POTASSIUM CHLORIDE CRYS ER 20 MEQ PO TBCR
40.0000 meq | EXTENDED_RELEASE_TABLET | ORAL | Status: AC
  Administered 2023-04-13 (×2): 40 meq via ORAL
  Filled 2023-04-13 (×2): qty 2

## 2023-04-13 MED ORDER — POTASSIUM CHLORIDE 10 MEQ/100ML IV SOLN
10.0000 meq | INTRAVENOUS | Status: AC
Start: 2023-04-13 — End: 2023-04-13
  Administered 2023-04-13 (×2): 10 meq via INTRAVENOUS
  Filled 2023-04-13 (×2): qty 100

## 2023-04-13 MED ORDER — POTASSIUM CHLORIDE CRYS ER 20 MEQ PO TBCR: 20.0000 meq | EXTENDED_RELEASE_TABLET | Freq: Every day | ORAL | Status: DC

## 2023-04-13 NOTE — Discharge Summary (Signed)
Physician Discharge Summary  KAMBRY TAKACS NGE:[id number] DOB: 02-Oct-1933 DOA: 03/28/2023  PCP: System, Provider Not In  Admit date: 03/28/2023 Discharge date: 04/14/2023 Recommendations for Outpatient Follow-up:  Follow up with PCP in 1 weeks-call for appointment Please obtain BMP/CBC in one week Follow-up with palliative care as an outpatient Follow-up with IR for drain management  Discharge Dispo: SNF Discharge Condition: Stable Code Status:   Code Status: Limited: Do not attempt resuscitation (DNR) -DNR-LIMITED -Do Not Intubate/DNI  Diet recommendation:  Diet Order             Diet Carb Modified Fluid consistency: Thin; Room service appropriate? Yes  Diet effective now                    Brief/Interim Summary: 89 yr old woman who presented to [facility] ED on 03/28/2023 with complaints of right sided flank pain. The patient had had a previous admission in November 2024 for AMS after being found unresponsive. She was found to be hypoglycemic on that occasion. She gradually improved, and was discharged to outpatient follow-up. The patient states that she developed right sided flank pain and high glucoses in the past few days. Upon presentation her glucose was 629.She also had a WBC of 18, Hgb 8.8. and sodium 125. CT abdomen and pelvis which demonstrated a circumferential mass in the distal ascending colon near the hepatic flexure measuring 7 cm. There is also a enhancing complex collection in the right perirenal space measuring 6 cm with concern for tumor or metastatic disease. IR was consulted in the ED for possible drain placement versus biopsy, AND admitted. The patient has been evaluated by IR. As the patient herself is not capable of expressing herself, following commands, or making her own decisions. Her son was at bedside and drain was placed 1/28. Palliative care has been consulted.  Subsequently, milligram by worsening leukocytosis then proceeded to decline. On 04/02/2023 Palliative  care had a family meeting: Family decided not to pursue a biopsy, wished for the patient to work with PT and perhaps go on to rehab. They might want to revisit biopsy in the future. ID was consulted and felt she is not appropriate for long term antibiotics.They feel that she is better suited for oral antibiotics.  2/1 had a discussion with the patient's sons regarding her diagnosis, prognosis, and her code status. She is having pain from the drain site. The drain will not be coming out unless she undergoes surgery for the colon mass. This will be a very big surgery and a difficult one for her to recover from. She is quite weak and debilitated as it is. The alternative would be to make the patient comfort care only.  The sons report that the patient has actually signed a document stating that she does not want any artificial measures to extend her life, family encouraged to discuss about CODE STATUS. 2/2: Elevated CBG, her D10 - discontinued.Family is still not very decisive about taking her back home with hospice and also does not want any extensive procedure. Prior drain cultures with pansensitive E. coli, Streptococcus Anginosis and bacteroid> strep angionsus is sensitive to penicillin, okay to keep Unasyn and changed to Augmentin upon discharge per ID. 2/3: episode of hypoglycemia as p.o. intake remained poor- dscontinued Semglee, IR repositioned  drain. Palliative care had further meeting and plan was discharged home with hospice care and family were agreeable.  But subsequently family informed that they do not have 24/7 caregiver at home so unable  to go home with hospice and they want to pursue skilled nursing facility and have palliative care follow-up.  With her bacteremia and intra-abdominal fluid collection abscess plan is to discharge on oral Augmentin Patient having some cough and also ongoing shortness of breath needing supplemental oxygen Further chest x-ray showed pleural effusion IR consulted  but unable to tap as fluid is too small and loculated CTA chest 2/7> no PE but large right and small left layering pleural effusion with compressive atelectasis/compressive collapse of right lower lobe mild shift of the heart over to the left, thickening of the colon on the splenic flexure, ascending colon mass and small tail hepatic ascites and mild anasarca noticed.  Patient was given  IV Lasix and home Lasix resumed as well. 04/11/23 1 unit transfusion ordered but only received half due to IV line coming ou, unable to replace IV line but subsequently midline was placed by PICC> hemoglobin came up initially 7.6 but again overnight down to 6.4 additional 1 unit PRBC ordered and hb improved. She had low potassium that was replaced and will be discharged once recheck is better.     Discharge Diagnoses:  Principal Problem:   Intraabdominal fluid collection Active Problems:   Palliative care by specialist   Colonic mass   Goals of care, counseling/discussion   Counseling and coordination of care   Need for emotional support   Bacteremia   DNR (do not resuscitate) discussion   General weakness  Intra-abdominal fluid collection-abscess Colonic Mass w/Abdominal pain Bacteremia with E faecalis-likely intra-abdominal source with abscess Leukocytosis:: Intra-abdominal fluid collection/abscess in the setting of colon mass, not a candidate for surgery subsequently underwent IR placed drainage and drain fluid with E. coli Streptococcus and bacteroids, also bacteremic. Seen by ID-drain cultures with pansensitive E. coli, Streptococcus Anginosis and bacteroided> strep angionsus is sensitive to penicillin, okay to keep Unasyn and to change to Augmentin upon discharge per ID x 30 days.Repeat blood cultures  NGTD TTE suboptimal but did not show any evidence of endocarditis per ID she would not be appropriate for long-term IV antibiotics.  Initially leukocytosis of 26k-resolved Concern for likely metastatic  disease or locally advanced colon cancer, no prior colonoscopy-palliative care consulted initially plan was home with hospice not pursuing SNF due to lack of 24/7 care at home.     Acute hypoxic resp failure: Large right and small left layering pleural effusion with compressive atelectasis/compressive collapse of the right lower lobe with mild shifT: Anasarca/fluid overload: BNP of 369, with weight gain, given IV Lasix and resume home Lasix.  IR consulted for thoracentesis on 2/6 but unable to see much fluid to tap on ultrasound. CT obtained 2/7 see result above no PE, continue supplemental oxygen, Lasix po and monitor electrolytes. Respiratory status stable on 2 L and will plan for home o2   Dementia Debilitated/deconditioning: At times bit lethargic remains weak deconditioned continue supportive care fall precaution delirium precaution    CKD stage IIIa Hypokalemia: creatinine 1.2 with poor oral intake, varying from 0.7-1.2.  Hypokalemia replaced and level improved will discharge on oral Lasix as well.   Hyponatremia mild: Likely with poor oral intake and?  SIADH   Anemia of chronic disease likely from malignancy: Hemoglobin drifting further she does appear symptomatic and short of breath.  04/11/23 1 unit transfusion ordered but only received half due to IV line coming ou, unable to replace IV line but subsequently midline was placed by PICC> hemoglobin came up initially 7.6 but again overnight down to  6.4 additional 1 unit PRBC ordered although hemoglobin has stabilized.  HTN: Not on meds.  BP stable   GERD: On PPI   Type 2 diabetes mellitus with uncontrolled Hyperglycemia  Episodes of hypoglycemia due to poor oral intake: Blood sugar remains well-controlled, mild hypoglycemia of 69 long-acting insulin has been discontinued, resume home meds and hold off on long acting insulin  Goals of care: Patient has been changed to DNR limited, extensive meeting were held with palliative  care and the family in the light of her colon mass likely malignancy with dementia deconditioning debility, initial plan was discharged home with hospice but at this time family unable to provide 24/7 care at home and they would like to proceed with patient going into rehab with palliative care following as outpatient.  Consults: PMT IR CCS Subjective: AA, no complaitns resting well   Discharge Exam: Vitals:   04/13/23 2108 04/14/23 0550  BP: (!) 152/89 (!) 163/96  Pulse: 92 98  Resp: 18 18  Temp: 97.8 F (36.6 C) 98.7 F (37.1 C)  SpO2: 100% 100%   General: Pt is alert, awake, not in acute distress Cardiovascular: RRR, S1/S2 +, no rubs, no gallops Respiratory: CTA bilaterally, no wheezing, no rhonchi Abdominal: Soft, NT, ND, bowel sounds + Extremities: no edema, no cyanosis  Discharge Instructions  Discharge Instructions     Discharge instructions   Complete by: As directed    Follow-up with outpatient palliative care upon discharge  Please call call MD or return to ER for similar or worsening recurring problem that brought you to hospital or if any fever,nausea/vomiting,abdominal pain, uncontrolled pain, chest pain,  shortness of breath or any other alarming symptoms.  Please follow-up your doctor as instructed in a week time and call the office for appointment.  Please avoid alcohol, smoking, or any other illicit substance and maintain healthy habits including taking your regular medications as prescribed.  You were cared for by a hospitalist during your hospital stay. If you have any questions about your discharge medications or the care you received while you were in the hospital after you are discharged, you can call the unit and ask to speak with the hospitalist on call if the hospitalist that took care of you is not available.  Once you are discharged, your primary care physician will handle any further medical issues. Please note that NO REFILLS for any discharge  medications will be authorized once you are discharged, as it is imperative that you return to your primary care physician (or establish a relationship with a primary care physician if you do not have one) for your aftercare needs so that they can reassess your need for medications and monitor your lab values   Discharge wound care:   Complete by: As directed    Continue drain care   Increase activity slowly   Complete by: As directed    No wound care   Complete by: As directed       Allergies as of 04/14/2023       Reactions   Tradjenta [linagliptin] Swelling        Medication List     STOP taking these medications    carvedilol 6.25 MG tablet Commonly known as: COREG   metFORMIN 500 MG 24 hr tablet Commonly known as: GLUCOPHAGE-XR   telmisartan 80 MG tablet Commonly known as: MICARDIS   Tresiba FlexTouch 100 UNIT/ML FlexTouch Pen Generic drug: insulin degludec       TAKE these medications  amoxicillin-clavulanate 875-125 MG tablet Commonly known as: AUGMENTIN Take 1 tablet by mouth 2 (two) times daily. Stop date 04/28/23   Aspirin EC Adult Low Dose 81 MG tablet Generic drug: aspirin EC Take 81 mg by mouth daily.   Calcium + Vitamin D3 600-5 MG-MCG Tabs Generic drug: Calcium Carb-Cholecalciferol Take 1 tablet by mouth daily.   FeroSul 325 (65 Fe) MG tablet Generic drug: ferrous sulfate Take 325 mg by mouth 2 (two) times daily.   furosemide 40 MG tablet Commonly known as: LASIX Take 40 mg by mouth daily.   pantoprazole 40 MG tablet Commonly known as: PROTONIX Take 1 tablet (40 mg total) by mouth 2 (two) times daily.   polyethylene glycol 17 g packet Commonly known as: MIRALAX / GLYCOLAX Take 17 g by mouth daily.   potassium chloride SA 20 MEQ tablet Commonly known as: KLOR-CON M Take 1 tablet (20 mEq total) by mouth daily. What changed: when to take this               Discharge Care Instructions  (From admission, onward)            Start     Ordered   04/08/23 0000  Discharge wound care:       Comments: Continue drain care   04/08/23 1152            Contact information for after-discharge care     Destination     [facility] Preferred SNF .   Service: Skilled Nursing Contact information: [address] [place] [address] [phone]                    Allergies  Allergen Reactions   Tradjenta [Linagliptin] Swelling    The results of significant diagnostics from this hospitalization (including imaging, microbiology, ancillary and laboratory) are listed below for reference.    Microbiology: No results found for this or any previous visit (from the past 240 hours).  Procedures/Studies: [place] EKG SITE RITE Result Date: 04/11/2023 If [facility] image not attached, placement could not be confirmed due to current cardiac rhythm.  CT Angio Chest Pulmonary Embolism (PE) W or WO Contrast Result Date: 04/10/2023 CLINICAL DATA:  Acute hypoxic respiratory failure. Patient diagnosed last month with right-sided colon cancer complicated by perforation and right retroperitoneal abscess treated with percutaneous drainage. Now presents with large right pleural effusion. No safe window for ultrasound-guided thoracentesis was found on chest ultrasound yesterday. EXAM: CT ANGIOGRAPHY CHEST WITH CONTRAST TECHNIQUE: Multidetector CT imaging of the chest was performed using the standard protocol during bolus administration of intravenous contrast. Multiplanar CT image reconstructions and MIPs were obtained to evaluate the vascular anatomy. RADIATION DOSE REDUCTION: This exam was performed according to the departmental dose-optimization program which includes automated exposure control, adjustment of the mA and/or kV according to patient size and/or use of iterative reconstruction technique. CONTRAST:  75mL OMNIPAQUE IOHEXOL 350 MG/ML SOLN COMPARISON:  CT abdomen and pelvis  with IV contrast prior to percutaneous right abdominal abscess drainage dated 03/28/2023, portable chest yesterday at 7:51 a.m., and portable chest 01/16/2023. No prior chest dedicated CT. FINDINGS: Cardiovascular: The heart is mildly shifted over to the left due to a large right pleural effusion. The cardiac size is normal. There are patchy two-vessel coronary calcifications in the LAD and circumflex. There is no substantial pericardial effusion. The pulmonary trunk and main arteries are upper normal in caliber. No arterial embolism is seen. Pulmonary veins are nondistended. There patchy aortic  calcific plaques, mild aortic tortuosity without aneurysm, stenosis or dissection. There are occasional calcific plaques in the great vessels. The great vessels are widely patent. There is a two-vessel aortic arch with normal variant short-segment brachiobicarotid trunk. Mediastinum/Nodes: No enlarged mediastinal, hilar, or axillary lymph nodes. Thyroid gland, trachea, and esophagus demonstrate no significant findings. Lungs/Pleura: Chronically elevated right hemidiaphragm. New from 03/28/2023, there is a large right and small left layering pleural effusions. The pleural fluid is low in density and does not have the appearance of hemorrhage. No pleural-based nodularity is seen. On the right the effusion collapses the lower lobe and portions of the posterior upper and middle lobes. On the left there is mild compressive atelectatic effect in the posterior lower lobe. Aerated portions of the lungs are clear. Consider follow-up chest CT after resolution or removal of the effusions to fully assess the lung fields. There is no pneumothorax. Upper Abdomen: A percutaneous drain in the subhepatic space is partially visible. Concern for thickening of the wall of the splenic flexure only partially seen. Correlate clinically for colitis. A mass in the ascending colon is also partially visible. There is abdominal aortic atherosclerosis.  Small volume of perihepatic ascites. Musculoskeletal: There are degenerative changes of the spine shoulders. No destructive bone lesions. Mild thoracic levoscoliosis. The ribcage is intact. Mild anasarca lower chest wall. Review of the MIP images confirms the above findings. IMPRESSION: 1. No evidence of arterial embolus. 2. Large right and small left layering pleural effusions with compressive atelectatic effect in the the adjacent lungs, compressive collapse right lower lobe, and mild shift of the heart over to the left. Consider follow-up chest CT after resolution or removal of the effusions to fully assess the lung fields. 3. Aortic and coronary artery atherosclerosis. 4. Percutaneous drain in the subhepatic space. 5. Concern for thickening of the wall of the splenic flexure only partially seen. Correlate clinically for colitis. 6. Ascending colon mass partially visible. 7. Small volume of perihepatic ascites. 8. Mild anasarca. Aortic Atherosclerosis (ICD10-I70.0). Electronically Signed   By: Almira Bar M.D.   On: 04/10/2023 22:55   [place] CHEST (PLEURAL EFFUSION) Result Date: 04/09/2023 INDICATION: 89 year old female presents with shortness of breath. Previous imaging showed right pleural effusion. Request for therapeutic and diagnostic thoracentesis. EXAM: CHEST ULTRASOUND COMPARISON:  None Available. FINDINGS: Bilateral pleural space visualized with ultrasound. No pleural effusion in the left pleural cavity. Small pleural effusion in the right pleural cavity with loculation. No safe window for thoracentesis found. IMPRESSION: Small right pleural effusion with loculation without safe window for thoracentesis. Thoracentesis was not performed. Performed by: Lawernce Ion, PA-C Electronically Signed   By: Irish Lack M.D.   On: 04/09/2023 16:42   DG Chest Port 1 View Result Date: 04/09/2023 CLINICAL DATA:  89 year old female with shortness of breath. Colon cancer. EXAM: PORTABLE CHEST 1 VIEW COMPARISON:   Portable chest 01/16/2023. CT Abdomen and Pelvis 04/04/2023. FINDINGS: Portable AP semi upright view at 0751 hours. Moderate to large right pleural effusion with partial right lung collapse which was visible on the recent CT Abdomen and Pelvis. Left lung appears negative. Stable visible mediastinal contours. Calcified aortic atherosclerosis. Visualized tracheal air column is within normal limits. No pneumothorax or pulmonary edema. Right abdominal drainage catheter redemonstrated. Paucity of bowel gas. Stable visualized osseous structures. IMPRESSION: 1. Ongoing moderate to large right pleural effusion, as seen on recent CT Abdomen and Pelvis. 2. No new cardiopulmonary abnormality. Electronically Signed   By: Odessa Fleming M.D.   On: 04/09/2023 08:16  IR Catheter Tube Change Result Date: 04/06/2023 INDICATION: Right-sided colon mass, retroperitoneal abscess, status post percutaneous drain catheter placement 03/31/2023. Follow-up CT showed retraction of the catheter, proximal side hole nearly at the skin surface. Continued output from catheter 25 cc per day. Catheter revision requested before discharge EXAM: EXCHANGE OF ABSCESS DRAIN CATHETER UNDER FLUOROSCOPY MEDICATIONS: The patient is currently admitted to the hospital and receiving intravenous antibiotics. The antibiotics were administered within an appropriate time frame prior to the initiation of the procedure. ANESTHESIA/SEDATION: 1% lidocaine subcutaneous COMPLICATIONS: None immediate. PROCEDURE: Informed written consent was obtained from the patient after a thorough discussion of the procedural risks, benefits and alternatives. All questions were addressed. Maximal Sterile Barrier Technique was utilized including caps, mask, sterile gowns, sterile gloves, sterile drape, hand hygiene and skin antiseptic. A timeout was performed prior to the initiation of the procedure. Catheter and surrounding skin prepped with Betadine, draped in usual sterile fashion,  infiltrated locally with 1% lidocaine. Small contrast injection under fluoroscopy demonstrated flow to the residual retroperitoneal abscess cavity. No fistula to bowel demonstrated. The catheter was cut and exchanged over an Amplatz wire for a new 10 French pigtail drain catheter, forms within the deep portion of the residual retroperitoneal collection. Catheter position confirmed on fluoroscopy. Catheter was flushed, secured externally with 0 Prolene suture and StatLock and placed to gravity drain bag. The patient tolerated the procedure well. IMPRESSION: 1. Technically successful exchange and revision of 10 French right retroperitoneal pigtail abscess drain catheter under fluoroscopy. Electronically Signed   By: Corlis Leak M.D.   On: 04/06/2023 17:14   CT ABDOMEN PELVIS W CONTRAST Result Date: 04/04/2023 CLINICAL DATA:  Intra-abdominal fluid collection EXAM: CT ABDOMEN AND PELVIS WITH CONTRAST TECHNIQUE: Multidetector CT imaging of the abdomen and pelvis was performed using the standard protocol following bolus administration of intravenous contrast. RADIATION DOSE REDUCTION: This exam was performed according to the departmental dose-optimization program which includes automated exposure control, adjustment of the mA and/or kV according to patient size and/or use of iterative reconstruction technique. CONTRAST:  80mL OMNIPAQUE IOHEXOL 300 MG/ML  SOLN COMPARISON:  CT 03/28/2023, 03/31/2023 FINDINGS: Lower chest: Lung bases demonstrate new incompletely visualized at least moderate right pleural effusion. Consolidation in the right lower lobe and middle lobe either reflecting atelectasis or pneumonia. Coronary vascular calcifications. Hepatobiliary: No calcified gallstone or biliary dilatation. No focal hepatic abnormality. Pancreas: Atrophic.  No inflammation Spleen: Normal in size without focal abnormality. Adrenals/Urinary Tract: Adrenal glands are normal. Kidneys show no hydronephrosis. Small nonobstructing  stone lower pole left kidney. Duplex appearance of the left renal collecting system. Urinary bladder is unremarkable Stomach/Bowel: The stomach is nonenlarged. Fluid-filled small bowel without wall thickening or obstruction. Moderate fluid distension of the cecum. Irregular ascending colon mass is redemonstrated. Fluid within the transverse and descending colon. Diverticular disease of the left colon without acute inflammation. Residual fluid and inflammation surrounding the ascending colon mass. Gas and fluid tracking between the right retroperitoneal collection and the posterior aspect of the colon mass, series 3, image 36, could be due to contained perforation or fistula. Vascular/Lymphatic: Moderate aortic atherosclerosis. No aneurysm. No increase lymph nodes Reproductive: Hysterectomy.  No adnexal mass Other: No pelvic effusion. Small volume inferior right perihepatic fluid. Interim placement of right lateral percutaneous drainage catheter. Decreased right perinephric/retroperitoneal fluid collection with small residual fluid and foci of gas but no discretely measurable fluid collection on this exam. Since the drainage procedure, the catheter has been slightly withdrawn with straightening of the pigtail and radiodense  catheter marker now visible at the lower chest wall. Musculoskeletal: No acute or suspicious osseous abnormality IMPRESSION: 1. Interim placement of right lateral percutaneous drainage catheter. Decreased right perinephric/retroperitoneal fluid collection with small residual fluid and foci of gas but no discretely measurable fluid collection on this exam. Since the drainage procedure, the catheter has been slightly withdrawn with straightening of the pigtail and radiodense catheter marker now visible at the lower chest wall. 2. Irregular ascending colon mass concerning for neoplasm is redemonstrated. Residual fluid and inflammatory changes surrounding the colon mass. Gas and fluid tracking between  the right retroperitoneal collection and the posterior aspect of the colon mass, could be due to contained perforation or fistula. 3. New incompletely visualized at least moderate right pleural effusion. Consolidation in the right lower lobe and middle lobe either reflecting atelectasis or pneumonia. 4. Diverticular disease of the left colon without acute inflammation. 5. Aortic atherosclerosis. Aortic Atherosclerosis (ICD10-I70.0). Electronically Signed   By: Jasmine Pang M.D.   On: 04/04/2023 19:55   ECHOCARDIOGRAM COMPLETE Result Date: 04/01/2023    ECHOCARDIOGRAM REPORT   Patient Name:   FRANCENIA CHIMENTI Fullen Date of Exam: 04/01/2023 Medical Rec #:  [id number]       Height:       66.0 in Accession #:    [id number]      Weight:       167.8 lb Date of Birth:  06/27/1933        BSA:          1.856 m Patient Age:    89 years        BP:           124/61 mmHg Patient Gender: F               HR:           98 bpm. Exam Location:  Inpatient Procedure: 2D Echo, Cardiac Doppler and Color Doppler Indications:    Bacteremia  History:        Patient has prior history of Echocardiogram examinations, most                 recent 08/16/2018.  Sonographer:    Harriette Bouillon RDCS Referring Phys: [id number] CORNELIUS N V[place] IMPRESSIONS  1. Left ventricular ejection fraction, by estimation, is 65 to 70%. The left ventricle has normal function. The left ventricle has no regional wall motion abnormalities. There is mild asymmetric left ventricular hypertrophy of the basal-septal segment. Left ventricular diastolic parameters are consistent with Grade I diastolic dysfunction (impaired relaxation).  2. Right ventricular systolic function is normal. The right ventricular size is normal. Tricuspid regurgitation signal is inadequate for assessing PA pressure.  3. The mitral valve is normal in structure. No evidence of mitral valve regurgitation. No evidence of mitral stenosis.  4. The aortic valve is tricuspid. There is mild calcification of the  aortic valve. Aortic valve regurgitation is not visualized. Aortic valve sclerosis is present, with no evidence of aortic valve stenosis.  5. The inferior vena cava is normal in size with <50% respiratory variability, suggesting right atrial pressure of 8 mmHg.  6. No valvular vegetation noted but valves not particularly well-visualized. FINDINGS  Left Ventricle: Left ventricular ejection fraction, by estimation, is 65 to 70%. The left ventricle has normal function. The left ventricle has no regional wall motion abnormalities. The left ventricular internal cavity size was normal in size. There is  mild asymmetric left ventricular hypertrophy of the basal-septal segment. Left ventricular  diastolic parameters are consistent with Grade I diastolic dysfunction (impaired relaxation). Right Ventricle: The right ventricular size is normal. No increase in right ventricular wall thickness. Right ventricular systolic function is normal. Tricuspid regurgitation signal is inadequate for assessing PA pressure. Left Atrium: Left atrial size was normal in size. Right Atrium: Right atrial size was normal in size. Pericardium: Trivial pericardial effusion is present. Mitral Valve: The mitral valve is normal in structure. There is mild calcification of the mitral valve leaflet(s). Mild mitral annular calcification. No evidence of mitral valve regurgitation. No evidence of mitral valve stenosis. Tricuspid Valve: The tricuspid valve is not well visualized. Tricuspid valve regurgitation is not demonstrated. Aortic Valve: The aortic valve is tricuspid. There is mild calcification of the aortic valve. Aortic valve regurgitation is not visualized. Aortic valve sclerosis is present, with no evidence of aortic valve stenosis. There is no evidence of aortic valve  vegetation. Pulmonic Valve: The pulmonic valve was normal in structure. Pulmonic valve regurgitation is trivial. Aorta: The aortic root is normal in size and structure. Venous: The  inferior vena cava is normal in size with less than 50% respiratory variability, suggesting right atrial pressure of 8 mmHg. IAS/Shunts: No atrial level shunt detected by color flow Doppler.  LEFT VENTRICLE PLAX 2D LVIDd:         4.00 cm   Diastology LVIDs:         2.60 cm   LV e' medial:    3.26 cm/s LV PW:         0.80 cm   LV E/e' medial:  23.7 LV IVS:        0.90 cm   LV e' lateral:   6.31 cm/s LVOT diam:     2.00 cm   LV E/e' lateral: 12.2 LV SV:         37 LV SV Index:   20 LVOT Area:     3.14 cm  IVC IVC diam: 1.90 cm LEFT ATRIUM         Index LA diam:    2.30 cm 1.24 cm/m  AORTIC VALVE LVOT Vmax:   67.00 cm/s LVOT Vmean:  43.700 cm/s LVOT VTI:    0.119 m  AORTA Ao Root diam: 2.80 cm Ao Asc diam:  3.10 cm MITRAL VALVE MV Area (PHT): 4.24 cm    SHUNTS MV Decel Time: 179 msec    Systemic VTI:  0.12 m MV E velocity: 77.10 cm/s  Systemic Diam: 2.00 cm MV A velocity: 84.60 cm/s MV E/A ratio:  0.91 Dalton McleanMD Electronically signed by Wilfred Lacy Signature Date/Time: 04/01/2023/10:41:23 AM    Final    CT GUIDED PERITONEAL/RETROPERITONEAL FLUID DRAIN BY PERC CATH Result Date: 03/31/2023 INDICATION: 89 year old female with imaging diagnosis of right-sided colon cancer, retroperitoneal abscess, referred for drainage EXAM: CT-GUIDED DRAINAGE OF ABDOMINAL ABSCESS TECHNIQUE: Multidetector CT imaging of the abdomen was performed following the standard protocol without IV contrast. RADIATION DOSE REDUCTION: This exam was performed according to the departmental dose-optimization program which includes automated exposure control, adjustment of the mA and/or kV according to patient size and/or use of iterative reconstruction technique. MEDICATIONS: The patient is currently admitted to the hospital and receiving intravenous antibiotics. The antibiotics were administered within an appropriate time frame prior to the initiation of the procedure. ANESTHESIA/SEDATION: Moderate (conscious) sedation was employed during  this procedure. A total of Versed 0.5 mg and Fentanyl 50 mcg was administered intravenously by the radiology nurse. Total intra-service moderate Sedation Time: 17 minutes. The patient's  level of consciousness and vital signs were monitored continuously by radiology nursing throughout the procedure under my direct supervision. COMPLICATIONS: None PROCEDURE: Informed written consent was obtained from the patient after a thorough discussion of the procedural risks, benefits and alternatives. All questions were addressed. Maximal Sterile Barrier Technique was utilized including caps, mask, sterile gowns, sterile gloves, sterile drape, hand hygiene and skin antiseptic. A timeout was performed prior to the initiation of the procedure. Patient was positioned supine on the CT gantry table with scout CT acquired for planning purposes. We then needed to position the patient left decubitus. Repeat images were acquired in left decubitus position. The patient was then prepped and draped in the usual sterile fashion. 1% lidocaine was used for local anesthesia. Using CT guidance, trocar needle was advanced into the abscess adjacent to the right kidney. Modified Seldinger technique was used to then placed a 10 [place] drain. Approximately 25 cc of frankly purulent material aspirated for culture. Drain was sutured in position attached to bulb suction. Final CT was acquired. Patient tolerated the procedure well and remained hemodynamically stable throughout. No complications were encountered and no significant blood loss. IMPRESSION: Status post CT-guided drainage of right abdominal abscess. Signed, Yvone Neu. Miachel Roux, RPVI [facility] Electronically Signed   By: Gilmer Mor D.O.   On: 03/31/2023 15:05   CT ABDOMEN PELVIS W CONTRAST Result Date: 03/28/2023 CLINICAL DATA:  Right lower quadrant pain EXAM: CT ABDOMEN AND PELVIS WITH CONTRAST TECHNIQUE: Multidetector  CT imaging of the abdomen and pelvis was performed using the standard protocol following bolus administration of intravenous contrast. RADIATION DOSE REDUCTION: This exam was performed according to the departmental dose-optimization program which includes automated exposure control, adjustment of the mA and/or kV according to patient size and/or use of iterative reconstruction technique. CONTRAST:  75mL OMNIPAQUE IOHEXOL 300 MG/ML  SOLN COMPARISON:  CT abdomen and pelvis 05/14/2018 FINDINGS: Lower chest: No acute abnormality. Hepatobiliary: No focal liver abnormality is seen. No gallstones, gallbladder wall thickening, or biliary dilatation. Pancreas: Unremarkable. No pancreatic ductal dilatation or surrounding inflammatory changes. Spleen: Normal in size without focal abnormality. Adrenals/Urinary Tract: There is a peripherally enhancing low-attenuation area in the right perirenal space lateral to the kidney with internal septations measuring 2.8 x 4.6 x 6.1 cm. Otherwise, the bilateral kidneys, adrenal glands, and bladder are within normal limits. Stomach/Bowel: Circumferential heterogeneous mass is seen in the distal ascending colon near the hepatic flexure measuring proximally 7.1 by 6.5 by 5.5 cm. There surrounding inflammatory stranding. This mass abuts the peripherally enhancing complex collection abutting the right kidney. There is no bowel obstruction, pneumatosis or free air. The appendix is within normal limits. Sigmoid colon diverticula are present. Small bowel and stomach are within normal limits. Vascular/Lymphatic: Aortic atherosclerosis. No enlarged abdominal or pelvic lymph nodes. Reproductive: Status post hysterectomy. No adnexal masses. Other: No abdominal wall hernia or abnormality. No abdominopelvic ascites. Musculoskeletal: Degenerative changes affect the spine. No fracture or suspicious osseous lesion. IMPRESSION: 1. Circumferential heterogeneous mass in the distal ascending colon near the  hepatic flexure measuring up to 7.1 cm. Findings are worrisome for colon neoplasm. 2. Peripherally enhancing complex collection in the right perirenal space lateral to the kidney measuring up to 6.1 cm. This abuts the colonic mass. Findings may represent direct extension of tumor/necrotic tumor, metastatic disease or abscess. 3. Sigmoid colon diverticulosis. 4. Aortic atherosclerosis. Aortic Atherosclerosis (ICD10-I70.0). Electronically Signed   By: Darliss Cheney M.D.   On: 03/28/2023 22:46  Labs: BNP (last 3 results) Recent Labs    04/09/23 1818  BNP 369.9*   Basic Metabolic Panel: Recent Labs  Lab 04/10/23 1107 04/11/23 0614 04/12/23 0323 04/13/23 0312 04/13/23 1459  NA 138 135 142 138 137  K 3.3* 3.0* 3.2* 2.9* 3.3*  CL 104 103 111 107 106  CO2 24 25 24 24 24   GLUCOSE 151* 167* 108* 134* 126*  BUN <5* <5* <5* <5* <5*  CREATININE 0.82 0.76 0.80 0.89 0.82  CALCIUM 7.5* 7.2* 7.4* 7.3* 7.3*  CBC: Recent Labs  Lab 04/09/23 1544 04/10/23 1107 04/11/23 0614 04/11/23 1859 04/12/23 0323 04/13/23 0312  WBC 10.3 10.2 8.6  --  9.3 10.4  HGB 7.9* 7.7* 7.0* 7.6* 6.4* 8.3*  HCT 26.2* 26.1* 22.8* 24.7* 20.7* 25.6*  MCV 77.1* 78.1* 75.2*  --  75.8* 79.0*  PLT 528* 534* 552*  --  539* 523*  Cardiac Enzymes: No results for input(s): "CKTOTAL", "CKMB", "CKMBINDEX", "TROPONINI" in the last 168 hours. BNP: Invalid input(s): "POCBNP" CBG: Recent Labs  Lab 04/13/23 0746 04/13/23 1126 04/13/23 1641 04/13/23 2115 04/14/23 0740  GLUCAP 131* 153* 95 96 194*      Component Value Date/Time   COLORURINE YELLOW 03/28/2023 1641   APPEARANCEUR HAZY (A) 03/28/2023 1641   LABSPEC 1.023 03/28/2023 1641   PHURINE 5.0 03/28/2023 1641   GLUCOSEU >=500 (A) 03/28/2023 1641   HGBUR NEGATIVE 03/28/2023 1641   BILIRUBINUR NEGATIVE 03/28/2023 1641   KETONESUR NEGATIVE 03/28/2023 1641   PROTEINUR NEGATIVE 03/28/2023 1641   UROBILINOGEN 0.2 04/21/2009 1319   NITRITE NEGATIVE 03/28/2023 1641    LEUKOCYTESUR TRACE (A) 03/28/2023 1641   Sepsis Labs Recent Labs  Lab 04/10/23 1107 04/11/23 0614 04/12/23 0323 04/13/23 0312  WBC 10.2 8.6 9.3 10.4   Microbiology No results found for this or any previous visit (from the past 240 hours).  Time coordinating discharge: 35 minutes  SIGNED: Lanae Boast, MD  [facility] 04/14/2023, 10:48 AM  If 7PM-7AM, please contact night-coverage [email or web address]

## 2023-04-13 NOTE — Progress Notes (Signed)
 Referring Physician(s): Kc,R  Supervising Physician: [name] Silvas  Patient Status:  [name]  Chief Complaint: Colon mass, retroperitoneal abscess; s/p drain placement 03/31/23 with exchange 2/3   Subjective: Pt without new c/o; asking when she may be discharged   Allergies: Tradjenta  [linagliptin ]  Medications: Prior to Admission medications   Medication Sig Start Date End Date Taking? Authorizing Provider  amoxicillin -clavulanate (AUGMENTIN ) 875-125 MG tablet Take 1 tablet by mouth 2 (two) times daily. Stop date 04/28/23 04/08/23  Yes Lesa Rape, MD  ASPIRIN  EC ADULT LOW DOSE 81 MG tablet Take 81 mg by mouth daily. 02/09/23  Yes [provider]  CALCIUM  + VITAMIN D3 600-5 MG-MCG TABS Take 1 tablet by mouth daily.   Yes [provider]  carvedilol  (COREG ) 6.25 MG tablet Take 6.25 mg by mouth 2 (two) times daily with a meal.   Yes [provider]  FEROSUL 325 (65 Fe) MG tablet Take 325 mg by mouth 2 (two) times daily.   Yes [provider]  furosemide  (LASIX ) 40 MG tablet Take 40 mg by mouth daily.   Yes [provider]  potassium chloride  SA (KLOR-CON  M) 20 MEQ tablet Take 20 mEq by mouth every morning. 03/09/23  Yes [provider]  telmisartan  (MICARDIS ) 80 MG tablet Take 80 mg by mouth every morning. 03/09/23  Yes [provider]  TRESIBA  FLEXTOUCH 100 UNIT/ML FlexTouch Pen Inject 6 Units into the skin in the morning. 02/05/23  Yes [provider]  metFORMIN  (GLUCOPHAGE -XR) 500 MG 24 hr tablet Take 1 tablet (500 mg total) by mouth daily with breakfast. Patient not taking: Reported on 03/29/2023 01/21/23   Leona Rake, MD  pantoprazole  (PROTONIX ) 40 MG tablet Take 1 tablet (40 mg total) by mouth 2 (two) times daily. Patient not taking: Reported on 03/29/2023 01/20/23   Adhikari, Amrit, MD  polyethylene glycol (MIRALAX  / GLYCOLAX ) 17 g packet Take 17 g by mouth daily. Patient not taking: Reported on  03/29/2023 01/21/23   Leona Rake, MD     Vital Signs: BP (!) 155/93 (BP Location: Left Arm)   Pulse (!) 107   Temp 98.2 F (36.8 C) (Oral)   Resp 20   Wt 177 lb 0.5 oz (80.3 kg)   SpO2 100%   BMI 28.57 kg/m   Physical Exam; awake, appears fatigued; answers simple questions ok; rt RP drain intact, insertion site mildly tender, OP small amount hazy, sl blood-tinged fluid; drain flushed with minimal return  Imaging: US  EKG SITE RITE Result Date: 04/11/2023 If Site Rite image not attached, placement could not be confirmed due to current cardiac rhythm.  CT Angio Chest Pulmonary Embolism (PE) W or WO Contrast Result Date: 04/10/2023 CLINICAL DATA:  Acute hypoxic respiratory failure. Patient diagnosed last month with right-sided colon cancer complicated by perforation and right retroperitoneal abscess treated with percutaneous drainage. Now presents with large right pleural effusion. No safe window for ultrasound-guided thoracentesis was found on chest ultrasound yesterday. EXAM: CT ANGIOGRAPHY CHEST WITH CONTRAST TECHNIQUE: Multidetector CT imaging of the chest was performed using the standard protocol during bolus administration of intravenous contrast. Multiplanar CT image reconstructions and MIPs were obtained to evaluate the vascular anatomy. RADIATION DOSE REDUCTION: This exam was performed according to the departmental dose-optimization program which includes automated exposure control, adjustment of the mA and/or kV according to patient size and/or use of iterative reconstruction technique. CONTRAST:  75mL OMNIPAQUE  IOHEXOL  350 MG/ML SOLN COMPARISON:  CT abdomen and pelvis with IV contrast prior  to percutaneous right abdominal abscess drainage dated 03/28/2023, portable chest yesterday at 7:51 a.m., and portable chest 01/16/2023. No prior chest dedicated CT. FINDINGS: Cardiovascular: The heart is mildly shifted over to the left due to a large right pleural effusion. The cardiac size is  normal. There are patchy two-vessel coronary calcifications in the LAD and circumflex. There is no substantial pericardial effusion. The pulmonary trunk and main arteries are upper normal in caliber. No arterial embolism is seen. Pulmonary veins are nondistended. There patchy aortic calcific plaques, mild aortic tortuosity without aneurysm, stenosis or dissection. There are occasional calcific plaques in the great vessels. The great vessels are widely patent. There is a two-vessel aortic arch with normal variant short-segment brachiobicarotid trunk. Mediastinum/Nodes: No enlarged mediastinal, hilar, or axillary lymph nodes. Thyroid  gland, trachea, and esophagus demonstrate no significant findings. Lungs/Pleura: Chronically elevated right hemidiaphragm. New from 03/28/2023, there is a large right and small left layering pleural effusions. The pleural fluid is low in density and does not have the appearance of hemorrhage. No pleural-based nodularity is seen. On the right the effusion collapses the lower lobe and portions of the posterior upper and middle lobes. On the left there is mild compressive atelectatic effect in the posterior lower lobe. Aerated portions of the lungs are clear. Consider follow-up chest CT after resolution or removal of the effusions to fully assess the lung fields. There is no pneumothorax. Upper Abdomen: A percutaneous drain in the subhepatic space is partially visible. Concern for thickening of the wall of the splenic flexure only partially seen. Correlate clinically for colitis. A mass in the ascending colon is also partially visible. There is abdominal aortic atherosclerosis. Small volume of perihepatic ascites. Musculoskeletal: There are degenerative changes of the spine shoulders. No destructive bone lesions. Mild thoracic levoscoliosis. The ribcage is intact. Mild anasarca lower chest wall. Review of the MIP images confirms the above findings. IMPRESSION: 1. No evidence of arterial  embolus. 2. Large right and small left layering pleural effusions with compressive atelectatic effect in the the adjacent lungs, compressive collapse right lower lobe, and mild shift of the heart over to the left. Consider follow-up chest CT after resolution or removal of the effusions to fully assess the lung fields. 3. Aortic and coronary artery atherosclerosis. 4. Percutaneous drain in the subhepatic space. 5. Concern for thickening of the wall of the splenic flexure only partially seen. Correlate clinically for colitis. 6. Ascending colon mass partially visible. 7. Small volume of perihepatic ascites. 8. Mild anasarca. Aortic Atherosclerosis (ICD10-I70.0). Electronically Signed   By: Denman Fischer M.D.   On: 04/10/2023 22:55   US  CHEST (PLEURAL EFFUSION) Result Date: 04/09/2023 INDICATION: 89 year old female presents with shortness of breath. Previous imaging showed right pleural effusion. Request for therapeutic and diagnostic thoracentesis. EXAM: CHEST ULTRASOUND COMPARISON:  None Available. FINDINGS: Bilateral pleural space visualized with ultrasound. No pleural effusion in the left pleural cavity. Small pleural effusion in the right pleural cavity with loculation. No safe window for thoracentesis found. IMPRESSION: Small right pleural effusion with loculation without safe window for thoracentesis. Thoracentesis was not performed. Performed by: Aimee Han, PA-C Electronically Signed   By: Erica Hau M.D.   On: 04/09/2023 16:42    Labs:  CBC: Recent Labs    04/10/23 1107 04/11/23 0614 04/11/23 1859 04/12/23 0323 04/13/23 0312  WBC 10.2 8.6  --  9.3 10.4  HGB 7.7* 7.0* 7.6* 6.4* 8.3*  HCT 26.1* 22.8* 24.7* 20.7* 25.6*  PLT 534* 552*  --  539* 523*  COAGS: Recent Labs    03/29/23 0850  INR 1.2    BMP: Recent Labs    04/10/23 1107 04/11/23 0614 04/12/23 0323 04/13/23 0312  NA 138 135 142 138  K 3.3* 3.0* 3.2* 2.9*  CL 104 103 111 107  CO2 24 25 24 24   GLUCOSE 151*  167* 108* 134*  BUN <5* <5* <5* <5*  CALCIUM  7.5* 7.2* 7.4* 7.3*  CREATININE 0.82 0.76 0.80 0.89  GFRNONAA >60 >60 >60 >60    LIVER FUNCTION TESTS: Recent Labs    11/19/22 1255 03/28/23 1641 03/29/23 0401  BILITOT 0.4 1.0 0.7  AST 11* 14* 15  ALT 6 12 11   ALKPHOS 51 71 63  PROT 6.6 7.2 6.6  ALBUMIN 3.3* 2.8* 2.4*    Assessment and Plan: Pt with hx rt colon mass/RP abscess, bacteremia, dementia, CKD, anemia; s/p RP drain placement 03/31/23 with exchange on 2/3; afebrile; WBC 10.4, hgb 8.3, K 2.9- getting IV potassium; drain fl cx- e coli/strept anginosis; "The drain will not be coming out unless she undergoes surgery for the colon mass. This will be a very big surgery and a difficult one for her to recover from. "  Pt awaiting SNF rehab with palliative; cont once daily irrigation of drain, output recording, dressing changes every 2-3 days   Electronically Signed: D. Honore Lux, PA-C 04/13/2023, 10:33 AM   I spent a total of 15 Minutes at the the patient's bedside AND on the patient's hospital floor or unit, greater than 50% of which was counseling/coordinating care for retroperitoneal abscess drain    Patient ID: Helen Richardson, female   DOB: 1933-10-28, 89 y.o.   MRN: [id number]

## 2023-04-13 NOTE — Progress Notes (Signed)
  Daily Progress Note   Patient Name: Helen Richardson       Date: 04/13/2023 DOB: 09/25/1933  Age: 89 y.o. MRN#: [id number] Attending Physician: Lesa Rape, MD Primary Care Physician: System, Provider Not In Admit Date: 03/28/2023 Length of Stay: 15 days  Reason for Consultation/Follow-up: Establishing goals of care  Subjective:   CC: Patient looks about the same as before, not in acute distress, appears chronically ill, now on 3 L O2 [place].    Subjective:  Reviewed EMR prior to presenting to bedside.     No acute distress noted.   Objective:   Vital Signs:  BP (!) 155/93 (BP Location: Left Arm)   Pulse (!) 107   Temp 98.2 F (36.8 C) (Oral)   Resp 20   Wt 80.3 kg   SpO2 100%   BMI 28.57 kg/m   Physical Exam: General: NAD, resting comfortably, chronically ill appearing  Cardiovascular: RRR Respiratory: no increased work of breathing noted, not in respiratory distress  Imaging: I personally reviewed recent imaging.   Assessment & Plan:   Assessment: Patient is an 89 year old female with a past medical history of dementia, hypertension, GERD, and diabetes who was admitted on 03/28/2023 for management of right-sided flank pain. Since admission, patient had imaging which demonstrated a circumferential mass in the distal ascending colon near the hepatic flexure measuring 7 cm and also on enhancing complex collection in the right perirenal space measuring 6 cm with concern for tumor or metastatic disease. IR consulted for evaluation. Palliative medicine team consulted to assist with complex medical decision making.   Recommendations/Plan: # Complex medical decision making/goals of care:   -Patient awaiting SNF rehab with palliative, once medically maximized.     TOC involved to assist with discharge planning.   Code Status: Limited: Do not attempt resuscitation (DNR) -DNR-LIMITED -Do Not Intubate/DNI   # Psychosocial Support:  - Support System: sons, niece, grand son.   #  Discharge Planning: SNF rehab with palliative, not residential hospice appropriate at present.   Thank you for allowing the palliative care team to participate in the care Janiah H Twardowski.  low MDM Lujean Sake MD Palliative Care Provider PMT # [phone]  If patient remains symptomatic despite maximum doses, please call PMT at [phone] between 0700 and 1900. Outside of these hours, please call attending, as PMT does not have night coverage.

## 2023-04-13 NOTE — Progress Notes (Addendum)
PROGRESS NOTE Helen Richardson  AOZ:[id number] DOB: 02-20-1934 DOA: 03/28/2023 PCP: System, Provider Not In  Brief Narrative/Hospital Course: 89 yr old woman who presented to [facility] ED on 03/28/2023 with complaints of right sided flank pain. The patient had had a previous admission in November 2024 for AMS after being found unresponsive. She was found to be hypoglycemic on that occasion. She gradually improved, and was discharged to outpatient follow-up. The patient states that she developed right sided flank pain and high glucoses in the past few days. Upon presentation her glucose was 629.She also had a WBC of 18, Hgb 8.8. and sodium 125. CT abdomen and pelvis which demonstrated a circumferential mass in the distal ascending colon near the hepatic flexure measuring 7 cm. There is also a enhancing complex collection in the right perirenal space measuring 6 cm with concern for tumor or metastatic disease. IR was consulted in the ED for possible drain placement versus biopsy, AND admitted. The patient has been evaluated by IR. As the patient herself is not capable of expressing herself, following commands, or making her own decisions. Her son was at bedside and drain was placed 1/28. Palliative care has been consulted.  Subsequently, milligram by worsening leukocytosis then proceeded to decline. On 04/02/2023 Palliative care had a family meeting: Family decided not to pursue a biopsy, wished for the patient to work with PT and perhaps go on to rehab. They might want to revisit biopsy in the future. ID was consulted and felt she is not appropriate for long term antibiotics.They feel that she is better suited for oral antibiotics.  2/1 had a discussion with the patient's sons regarding her diagnosis, prognosis, and her code status. She is having pain from the drain site. The drain will not be coming out unless she undergoes surgery for the colon mass. This will be a very big surgery and a difficult one for her to  recover from. She is quite weak and debilitated as it is. The alternative would be to make the patient comfort care only.  The sons report that the patient has actually signed a document stating that she does not want any artificial measures to extend her life, family encouraged to discuss about CODE STATUS. 2/2: Elevated CBG, her D10 - discontinued.Family is still not very decisive about taking her back home with hospice and also does not want any extensive procedure. Prior drain cultures with pansensitive E. coli, Streptococcus Anginosis and bacteroid> strep angionsus is sensitive to penicillin, okay to keep Unasyn and changed to Augmentin upon discharge per ID. 2/3: episode of hypoglycemia as p.o. intake remained poor- dscontinued Semglee, IR repositioned  drain. Palliative care had further meeting and plan was discharged home with hospice care and family were agreeable.  But subsequently family informed that they do not have 24/7 caregiver at home so unable to go home with hospice and they want to pursue skilled nursing facility and have palliative care follow-up.  With her bacteremia and intra-abdominal fluid collection abscess plan is to discharge on oral Augmentin Patient having some cough and also ongoing shortness of breath needing supplemental oxygen Further chest x-ray showed pleural effusion IR consulted but unable to tap as fluid is too small and loculated CTA chest 2/7> no PE but large right and small left layering pleural effusion with compressive atelectasis/compressive collapse of right lower lobe mild shift of the heart over to the left, thickening of the colon on the splenic flexure, ascending colon mass and small tail hepatic ascites and  mild anasarca noticed.  Patient was given  IV Lasix and home Lasix resumed as well. 04/11/23 1 unit transfusion ordered but only received half due to IV line coming ou, unable to replace IV line but subsequently midline was placed by PICC> hemoglobin came  up initially 7.6 but again overnight down to 6.4 additional 1 unit PRBC ordered and hb improved. She had low potassium that was replaced and will be discharged once recheck is better.   Subjective: Seen and examined this morning She has no complaints on 2 L nasal cannula She iss asking when she can go to rehab. K low and is being replaced.  Assessment and Plan: Principal Problem:   Intraabdominal fluid collection Active Problems:   Palliative care by specialist   Colonic mass   Goals of care, counseling/discussion   Counseling and coordination of care   Need for emotional support   Bacteremia   DNR (do not resuscitate) discussion   General weakness   Intra-abdominal fluid collection-abscess Colonic Mass w/Abdominal pain Bacteremia with E faecalis-likely intra-abdominal source with abscess Leukocytosis:: Intra-abdominal fluid collection/abscess in the setting of colon mass, not a candidate for surgery subsequently underwent IR placed drainage and drain fluid with E. coli Streptococcus and bacteroids, also bacteremic. Seen by ID-drain cultures with pansensitive E. coli, Streptococcus Anginosis and bacteroided> strep angionsus is sensitive to penicillin, okay to keep Unasyn and to change to Augmentin upon discharge per ID x 30 days.Repeat blood cultures  NGTD TTE suboptimal but did not show any evidence of endocarditis per ID she would not be appropriate for long-term IV antibiotics.  Initially leukocytosis of 26k-resolved Concern for likely metastatic disease or locally advanced colon cancer, no prior colonoscopy-palliative care consulted initially plan was home with hospice not pursuing SNF due to lack of 24/7 care at home.     Acute hypoxic resp failure: Large right and small left layering pleural effusion with compressive atelectasis/compressive collapse of the right lower lobe with mild shifT: Anasarca/fluid overload: BNP of 369, with weight gain, given IV Lasix and resume home  Lasix.  IR consulted for thoracentesis on 2/6 but unable to see much fluid to tap on ultrasound. CT obtained 2/7 see result above no PE, continue supplemental oxygen, Lasix po and monitor electrolytes. Respiratory status stable on 2 L and will plan for home o2   Dementia Debilitated/deconditioning: At times bit lethargic remains weak deconditioned continue supportive care fall precaution delirium precaution    CKD stage IIIa Hypokalemia: creatinine 1.2 with poor oral intake, varying from 0.7-1.2.  Hypokalemia replaced and we will recheck before discharge   Hyponatremia mild: Likely with poor oral intake and?  SIADH   Anemia of chronic disease likely from malignancy: Hemoglobin drifting further she does appear symptomatic and short of breath.  04/11/23 1 unit transfusion ordered but only received half due to IV line coming ou, unable to replace IV line but subsequently midline was placed by PICC> hemoglobin came up initially 7.6 but again overnight down to 6.4 additional 1 unit PRBC ordered although hemoglobin has stabilized.  Htn: Not on meds.  BP stable   GERD: On PPI   Type 2 diabetes mellitus with uncontrolled Hyperglycemia  Episodes of hypoglycemia due to poor oral intake: Blood sugar remains well-controlled, mild hypoglycemia of 69 long-acting insulin has been discontinued, resume home meds and hold off on long acting insulin  Goals of care: Patient has been changed to DNR limited, extensive meeting were held with palliative care and the family in the light  of her colon mass likely malignancy with dementia deconditioning debility, initial plan was discharged home with hospice but at this time family unable to provide 24/7 care at home and they would like to proceed with patient going into rehab with palliative care following as outpatient.    DVT prophylaxis: Place and maintain sequential compression device Start: 04/12/23 0629 SCDs Start: 03/29/23 0034 Code Status:   Code  Status: Limited: Do not attempt resuscitation (DNR) -DNR-LIMITED -Do Not Intubate/DNI  Family Communication: plan of care discussed with patient/none at bedside.  Updated patient's grandson POA 2/6 and also on 2/7 and 2/8.  I discussed and updated patient's son at the bedside 2/9  Patient status is: Remains hospitalized because of severity of illness. Level of care: Med-Surg.   Dispo: The patient is from:Home.            Anticipated disposition: SNF today or tomorrow pending if K is better and is SNF available  Objective: Vitals last 24 hrs: Vitals:   04/12/23 1352 04/12/23 1545 04/12/23 2234 04/13/23 1327  BP: 118/87 122/85 (!) 155/93 (!) 166/106  Pulse: (!) 102 88 (!) 107 98  Resp: 20 18 20 18   Temp: 98.1 F (36.7 C) 98.3 F (36.8 C) 98.2 F (36.8 C) 97.7 F (36.5 C)  TempSrc: Oral Oral Oral Oral  SpO2: 100% 98% 100% 100%  Weight:      Weight change:   Physical Examination: General exam: alert awake, pleasant  elderly and frail HEENT:Oral mucosa moist, Ear/Nose WNL grossly Respiratory system: Bilaterally clear BS,no use of accessory muscle Cardiovascular system: S1 & S2 +, No JVD. Gastrointestinal system: Abdomen soft,NT,ND, BS+ Nervous System: Alert, awake, moving all extremities,and following commands. Extremities: LE edema neg,distal peripheral pulses palpable and warm.  Skin: No rashes,no icterus. MSK: Normal muscle bulk,tone, power   Medications reviewed:  Scheduled Meds:  sodium chloride   Intravenous Once   feeding supplement  237 mL Oral BID BM   furosemide  40 mg Oral Daily   insulin aspart  0-9 Units Subcutaneous TID WC   pantoprazole  40 mg Oral BID   sodium chloride flush  10-40 mL Intracatheter Q12H   sodium chloride flush  5 mL Intracatheter Q8H  Continuous Infusions:  ampicillin-sulbactam (UNASYN) IV 3 g (04/13/23 1155)    Diet Order             Diet Carb Modified Fluid consistency: Thin; Room service appropriate? Yes  Diet effective now                   Intake/Output Summary (Last 24 hours) at 04/13/2023 1442 Last data filed at 04/13/2023 1400 Gross per 24 hour  Intake 1151.06 ml  Output 205 ml  Net 946.06 ml   Net IO Since Admission: 13,670.73 mL [04/13/23 1442]  Wt Readings from Last 3 Encounters:  04/09/23 80.3 kg  01/17/23 76.1 kg  11/19/22 79.8 kg     Unresulted Labs (From admission, onward)     Start     Ordered   04/13/23 1300  Basic metabolic panel  Once-Timed,   TIMED       Question:  Specimen collection method  Answer:  IV Team=IV Team collect   04/13/23 0918   04/11/23 0500  Basic metabolic panel  Daily,   R      04/10/23 1043   04/11/23 0500  CBC  Daily,   R      04/10/23 1043  Data Reviewed: I have personally reviewed following labs and imaging studies CBC: Recent Labs  Lab 04/09/23 1544 04/10/23 1107 04/11/23 0614 04/11/23 1859 04/12/23 0323 04/13/23 0312  WBC 10.3 10.2 8.6  --  9.3 10.4  HGB 7.9* 7.7* 7.0* 7.6* 6.4* 8.3*  HCT 26.2* 26.1* 22.8* 24.7* 20.7* 25.6*  MCV 77.1* 78.1* 75.2*  --  75.8* 79.0*  PLT 528* 534* 552*  --  539* 523*   Basic Metabolic Panel:  Recent Labs  Lab 04/09/23 1544 04/10/23 1107 04/11/23 0614 04/12/23 0323 04/13/23 0312  NA 138 138 135 142 138  K 3.3* 3.3* 3.0* 3.2* 2.9*  CL 104 104 103 111 107  CO2 23 24 25 24 24   GLUCOSE 155* 151* 167* 108* 134*  BUN <5* <5* <5* <5* <5*  CREATININE 0.92 0.82 0.76 0.80 0.89  CALCIUM 7.3* 7.5* 7.2* 7.4* 7.3*   GFR: Estimated Creatinine Clearance: 46.7 mL/min (by C-G formula based on SCr of 0.89 mg/dL). Liver Function Tests:  Recent Labs  Lab 04/12/23 1140 04/12/23 1631 04/12/23 2216 04/13/23 0746 04/13/23 1126  GLUCAP 173* 111* 140* 131* 153*  No results found for this or any previous visit (from the past 240 hours).   Antimicrobials/Microbiology: Anti-infectives (From admission, onward)    Start     Dose/Rate Route Frequency Ordered Stop   04/12/23 0000  Ampicillin-Sulbactam (UNASYN) 3 g in  sodium chloride 0.9 % 100 mL IVPB        3 g 200 mL/hr over 30 Minutes Intravenous Every 6 hours 04/11/23 1831     04/08/23 0000  amoxicillin-clavulanate (AUGMENTIN) 875-125 MG tablet        1 tablet Oral 2 times daily 04/08/23 1152     03/31/23 1600  Ampicillin-Sulbactam (UNASYN) 3 g in sodium chloride 0.9 % 100 mL IVPB  Status:  Discontinued        3 g 200 mL/hr over 30 Minutes Intravenous Every 6 hours 03/31/23 1451 04/11/23 1831   03/30/23 2300  ampicillin (OMNIPEN) 2 g in sodium chloride 0.9 % 100 mL IVPB  Status:  Discontinued        2 g 300 mL/hr over 20 Minutes Intravenous Every 6 hours 03/30/23 2204 03/31/23 1451   03/29/23 1000  ceFEPIme (MAXIPIME) 2 g in sodium chloride 0.9 % 100 mL IVPB  Status:  Discontinued        2 g 200 mL/hr over 30 Minutes Intravenous Every 12 hours 03/28/23 2348 03/29/23 0040   03/29/23 0000  vancomycin (VANCOCIN) IVPB 1000 mg/200 mL premix  Status:  Discontinued        1,000 mg 200 mL/hr over 60 Minutes Intravenous  Once 03/28/23 2348 03/29/23 0040   03/28/23 2215  cefTRIAXone (ROCEPHIN) 2 g in sodium chloride 0.9 % 100 mL IVPB        2 g 200 mL/hr over 30 Minutes Intravenous  Once 03/28/23 2203 03/29/23 0002         Component Value Date/Time   SDES  03/31/2023 1734    BLOOD BLOOD LEFT HAND Performed at [facility], [address], [place], [place] [address]    SPECREQUEST  03/31/2023 1734    BOTTLES DRAWN AEROBIC ONLY Blood Culture results may not be optimal due to an inadequate volume of blood received in culture bottles Performed at [facility], [address] [place], [place] [address]    CULT  03/31/2023 1734    NO GROWTH 5 DAYS Performed at [facility],  [address] [place], [place] [address]    REPTSTATUS 04/05/2023 FINAL 03/31/2023 1734     Radiology Studies: [place] EKG SITE RITE Result Date: 04/11/2023 If Site Rite image not attached, placement could not be confirmed due to current  cardiac rhythm.  LOS: 15 days  Total time spent in review of labs and imaging, patient evaluation, formulation of plan, documentation and communication with family: 35 minutes.  Lanae Boast, MD  [facility]  04/13/2023, 2:42 PM

## 2023-04-13 NOTE — TOC Transition Note (Addendum)
 Transition of Care [facility]) - Discharge Note   Patient Details  Name: Helen Richardson MRN: [id number] Date of Birth: [birth year]  [facility]) CM/SW Contact:  Ruben Corolla, RN Phone Number: 04/13/2023, 2:23 PM   Clinical Narrative:   If stable for d/c I am waiting for [facility] to confirm bed available today. I have already spoken to grand dtr Odilia Bennett. [employer] WU#[id number];[facility] [employer] FA#[phone] auth ends today.MD aware. -3:51p safe d/c to [facility] in am rep Rhonda/ MD aware.    Final next level of care: Skilled Nursing Facility Barriers to Discharge: No Barriers Identified   Patient Goals and CMS Choice Patient states their goals for this hospitalization and ongoing recovery are:: Rehab CMS Medicare.gov Compare Post Acute Care list provided to:: Patient Represenative (must comment) Choice offered to / list presented to : Adult Children [place] ownership interest in [facility].provided to:: Adult Children    Discharge Placement                       Discharge Plan and Services Additional resources added to the After Visit Summary for   In-house Referral: Clinical Social Work   Post Acute Care Choice: Skilled Nursing Facility          DME Arranged: N/A DME Agency: NA                  Social Drivers of Health (SDOH) Interventions SDOH Screenings   Food Insecurity: Patient Unable To Answer (03/30/2023)  Housing: Unknown (03/30/2023)  Transportation Needs: No Transportation Needs (03/30/2023)  Utilities: Not At Risk (03/30/2023)  Alcohol Screen: Low Risk  (10/18/2021)  Depression (PHQ2-9): Low Risk  (10/18/2021)  Financial Resource Strain: Low Risk  (10/18/2021)  Physical Activity: Inactive (10/18/2021)  Social Connections: Patient Unable To Answer (03/30/2023)  Stress: No Stress Concern Present (10/18/2021)  Tobacco Use: Low Risk  (03/29/2023)     Readmission Risk Interventions    04/06/2023    2:55 PM  Readmission Risk Prevention Plan   Transportation Screening Complete  PCP or Specialist Appt within 5-7 Days Complete  Home Care Screening Complete  Medication Review (RN CM) Complete

## 2023-04-13 NOTE — Progress Notes (Signed)
 Physical Therapy Treatment Patient Details Name: ADITHI POPKO MRN: [id number] DOB: 1933-11-30 Today's Date: 04/13/2023   History of Present Illness Pt is an 89 y/o F.  Patient presented to the ER via EMS 03/28/23 with hyperglycemia and c/o right flank pain. CT abdomen/pelvis 03/28/23:   1. Circumferential heterogeneous mass in the distal ascending colon  near the hepatic flexure measuring up to 7.1 cm. Findings are  worrisome for colon neoplasm.  2. Peripherally enhancing complex collection in the right perirenal  space lateral to the kidney measuring up to 6.1 cm. This abuts the  colonic mass. Findings may represent direct extension of  tumor/necrotic tumor, metastatic disease or abscess.  RUQ drain placed in IR on 03/31/23    PMH of acute encephalopathy  hypoglycemia, DM, CHF, HTN, high cholesterol. CTA chest 2/7 no PE but large right and small left layering pleural effusion with compressive atelectasis/compressive collapse of right lower lobe mild shift of the heart over to the left, thickening of the colon on the splenic flexure, ascending colon mass and small tail hepatic ascites and mild anasarca noticed. Low Hgb pt required 1 unit PRBC 2/8 and 2/9.    PT Comments   Pt admitted with above diagnosis.  Pt currently with functional limitations due to the deficits listed below (see PT Problem List). Pt in bed when PT arrived. Pt agreeable to therapy intervention. Pt noted to be soiled and PT and NT provided pt assist for hygiene and ADL tasks. Pt required min A for rolling side to side, min A for supine to sit and to scoot in sitting to EOB, pt required max A for sit to stand  from EOB to RW, min/mod A for side stepping ~2 feet toward HOB and pt required mod A for supine to sit. Pt able to engage with repositioning in bed. Pt reported need to void bowels, nurse tech notified and assist pt. Pt on 2 L/min throughout intervention and noted signs and symptoms of SOB with use of accessory mm. Pt left in bed,  nurse tech present and all needs in place. Pt indicated no pain and that she was going to go home today. Pt will benefit from acute skilled PT to increase their independence and safety with mobility to allow discharge.      If plan is discharge home, recommend the following: A lot of help with bathing/dressing/bathroom;A lot of help with walking and/or transfers   Can travel by private vehicle     No  Equipment Recommendations  None recommended by PT    Recommendations for Other Services       Precautions / Restrictions Precautions Precautions: Fall Precaution Comments: JP drain (R upper posterior back) monitor vitals Restrictions Weight Bearing Restrictions Per Provider Order: No     Mobility  Bed Mobility Overal bed mobility: Needs Assistance Bed Mobility: Supine to Sit, Rolling Rolling: Min assist   Supine to sit: Min assist, HOB elevated Sit to supine: Used rails, Mod assist   General bed mobility comments: multimodal cues, increased time and pt able to scoot in sitting to EOB with min A, min A to roll side to side with cues and use of handrail for assist with hygine/ADLs tasks    Transfers Overall transfer level: Needs assistance Equipment used: Rolling walker (2 wheels) Transfers: Sit to/from Stand Sit to Stand: Max assist           General transfer comment: pt required max A for sit to stand from EOB with pt demonstrating  strong trunk flexion and cues for proper UE placement with pt able to push to stand with R UE and L pull to stand at [facility]    Ambulation/Gait Ambulation/Gait assistance: Mod assist Gait Distance (Feet): 2 Feet Assistive device: Rolling walker (2 wheels) Gait Pattern/deviations: Step-to pattern Gait velocity: decreased     General Gait Details: pt able to side step to the R toward [facility] with mod to min A and substantial cues   Stairs             Wheelchair Mobility     Tilt Bed    Modified Rankin (Stroke Patients Only)        Balance Overall balance assessment: Needs assistance Sitting-balance support: Feet supported, No upper extremity supported Sitting balance-[name]: Fair     Standing balance support: During functional activity, Reliant on assistive device for balance Standing balance-[name]: Poor                              Cognition Arousal: Alert Behavior During Therapy: WFL for tasks assessed/performed Overall Cognitive Status: Within Functional Limits for tasks assessed Area of Impairment: Orientation, Attention, Following commands, Safety/judgement, Problem solving                 Orientation Level: Disoriented to, Situation, Time, Place Current Attention Level: Focused Memory: Decreased short-term memory Following Commands: Follows multi-step commands inconsistently Safety/Judgement: Decreased awareness of safety, Decreased awareness of deficits Awareness: Intellectual Problem Solving: Requires verbal cues, Requires tactile cues, Decreased initiation          Exercises      General Comments        Pertinent Vitals/Pain Pain Assessment Pain Assessment: No/denies pain Faces Pain Scale: No hurt Breathing: normal Negative Vocalization: none Facial Expression: smiling or inexpressive Body Language: relaxed Consolability: no need to console PAINAD Score: 0    Home Living                          Prior Function            PT Goals (current goals can now be found in the care plan section) Acute Rehab PT Goals PT Goal Formulation: With patient Time For Goal Achievement: 04/18/23 Potential to Achieve Goals: Good Progress towards PT goals: Not progressing toward goals - comment    Frequency    Min 1X/week      PT Plan      Co-evaluation              AM-PAC PT "6 Clicks" Mobility   Outcome Measure  Help needed turning from your back to your side while in a flat bed without using bedrails?: A Little Help needed  moving from lying on your back to sitting on the side of a flat bed without using bedrails?: A Little Help needed moving to and from a bed to a chair (including a wheelchair)?: A Lot Help needed standing up from a chair using your arms (e.g., wheelchair or bedside chair)?: A Lot Help needed to walk in hospital room?: A Lot Help needed climbing 3-5 steps with a railing? : Total 6 Click Score: 13    End of Session Equipment Utilized During Treatment: Gait belt Activity Tolerance: Patient limited by fatigue (SOB with use of accessory mm for breathing) Patient left: in chair;with call bell/phone within reach;with family/visitor present Nurse Communication: Mobility status PT Visit Diagnosis: Unsteadiness on  feet (R26.81);Other abnormalities of gait and mobility (R26.89)     Time: 1610-9604 PT Time Calculation (min) (ACUTE ONLY): 28 min  Charges:    $Therapeutic Activity: 23-37 mins PT General Charges $$ ACUTE PT VISIT: 1 Visit                     Cary Clarks, PT Acute Rehab    Annalee Kiang 04/13/2023, 12:59 PM

## 2023-04-13 NOTE — Plan of Care (Signed)

## 2023-04-14 DIAGNOSIS — R188 Other ascites: Secondary | ICD-10-CM | POA: Diagnosis not present

## 2023-04-14 LAB — GLUCOSE, CAPILLARY
Glucose-Capillary: 194 mg/dL — ABNORMAL HIGH (ref 70–99)
Glucose-Capillary: 221 mg/dL — ABNORMAL HIGH (ref 70–99)

## 2023-04-14 NOTE — Progress Notes (Signed)
Occupational Therapy Treatment Patient Details Name: Helen Richardson MRN: [id number] DOB: 01-06-1934 Today's Date: 04/14/2023   History of present illness Pt is an 89 y/o F.  Patient presented to the ER via EMS 03/28/23 with hyperglycemia and c/o right flank pain. CT abdomen/pelvis 03/28/23:   1. Circumferential heterogeneous mass in the distal ascending colon  near the hepatic flexure measuring up to 7.1 cm. Findings are  worrisome for colon neoplasm.  2. Peripherally enhancing complex collection in the right perirenal  space lateral to the kidney measuring up to 6.1 cm. This abuts the  colonic mass. Findings may represent direct extension of  tumor/necrotic tumor, metastatic disease or abscess.  RUQ drain placed in IR on 03/31/23    PMH of acute encephalopathy  hypoglycemia, DM, CHF, HTN, high cholesterol   OT comments  Pt making slow progress with functional goals. OT will continue to follow acutely to maximize level of function and safety      If plan is discharge home, recommend the following:  Supervision due to cognitive status;Assistance with cooking/housework;Assistance with feeding;Assist for transportation;A lot of help with bathing/dressing/bathroom;A lot of help with walking and/or transfers;Direct supervision/assist for medications management;Help with stairs or ramp for entrance   Equipment Recommendations  None recommended by OT    Recommendations for Other Services      Precautions / Restrictions Precautions Precautions: Fall Precaution/Restrictions Comments: JP drain (R upper posterior back) monitor vitals Restrictions Weight Bearing Restrictions Per Provider Order: No       Mobility Bed Mobility Overal bed mobility: Needs Assistance Bed Mobility: Supine to Sit, Rolling, Sit to Supine Rolling: Min assist   Supine to sit: Min assist, HOB elevated Sit to supine: Mod assist, Used rails   General bed mobility comments: multimodal cues, increased time and pt able to  scoot in sitting to EOB with min A, min A to roll side to side with cues and use of handrail for assist with hygine/ADLs tasks    Transfers Overall transfer level: Needs assistance Equipment used: Rolling walker (2 wheels) Transfers: Sit to/from Stand Sit to Stand: Max assist                 Balance Overall balance assessment: Needs assistance Sitting-balance support: Feet supported, No upper extremity supported Sitting balance-[name]: Fair     Standing balance support: During functional activity, Reliant on assistive device for balance Standing balance-[name]: Poor                             ADL either performed or assessed with clinical judgement   ADL Overall ADL's : Needs assistance/impaired Eating/Feeding: Cueing for sequencing   Grooming: Wash/dry hands;Wash/dry face;Contact guard assist;Sitting           Upper Body Dressing : Minimal assistance;Sitting       Toilet Transfer: Maximal assistance;Moderate assistance;Stand-pivot;Rolling walker (2 wheels);Cueing for safety;Cueing for sequencing   Toileting- Clothing Manipulation and Hygiene: Total assistance;Sit to/from stand       Functional mobility during ADLs: Maximal assistance;Moderate assistance;Cueing for safety;Cueing for sequencing      Extremity/Trunk Assessment Upper Extremity Assessment Upper Extremity Assessment: Generalized weakness   Lower Extremity Assessment Lower Extremity Assessment: Defer to PT evaluation   Cervical / Trunk Assessment Cervical / Trunk Assessment: Other exceptions Cervical / Trunk Exceptions: Obesity    Vision Ability to See in Adequate Light: 0 Adequate Patient Visual Report: No change from baseline  Perception     Praxis     Communication     Cognition Arousal: Alert Behavior During Therapy: WFL for tasks assessed/performed                                   Following commands impaired: Follows one step commands  inconsistently      Cueing      Exercises      Shoulder Instructions       General Comments      Pertinent Vitals/ Pain       Pain Assessment Pain Assessment: No/denies pain Faces Pain Scale: No hurt Pain Intervention(s): Monitored during session, Repositioned  Home Living                                          Prior Functioning/Environment              Frequency  Min 1X/week        Progress Toward Goals  OT Goals(current goals can now be found in the care plan section)  Progress towards OT goals: Progressing toward goals     Plan      Co-evaluation                 AM-PAC OT "6 Clicks" Daily Activity     Outcome Measure   Help from another person eating meals?: A Little Help from another person taking care of personal grooming?: A Little Help from another person toileting, which includes using toliet, bedpan, or urinal?: Total Help from another person bathing (including washing, rinsing, drying)?: A Lot Help from another person to put on and taking off regular upper body clothing?: A Little Help from another person to put on and taking off regular lower body clothing?: Total 6 Click Score: 13    End of Session Equipment Utilized During Treatment: Gait belt;Rolling walker (2 wheels);Other (comment);Oxygen (BSC)  OT Visit Diagnosis: Unsteadiness on feet (R26.81);Pain;Muscle weakness (generalized) (M62.81)   Activity Tolerance Patient limited by fatigue   Patient Left with call bell/phone within reach;in bed;with bed alarm set   Nurse Communication          Time: [phone] OT Time Calculation (min): 18 min  Charges: OT General Charges $OT Visit: 1 Visit OT Treatments $Self Care/Home Management : 8-22 mins    Galen Manila 04/14/2023, 4:42 PM

## 2023-04-14 NOTE — Discharge Instructions (Addendum)
of breath.   You develop chest pain.   You have problems with your speech or vision.   You have trouble balancing or moving your arms or legs.   Summary   It is common to have a small amount of bruising and discomfort in the area where the drainage tube (catheter) was placed. You may also have minor discomfort with movement while the drain is in place.   Flush the drain once per day with 5 mL of 0.9% normal saline (unless you were told otherwise by your healthcare provider).    Record the amount of drainage from the bag every time you empty it.   Change the dressing every 3 days or earlier if soiled/wet. Keep the skin dry under the dressing.   You may shower with the drain in place. Do not submerge the drain (no baths, swimming, hot tubs, etc.).   Contact [facility] at [phone] if you have more redness, swelling, or pain around your incision area or if you have pain that does not get better with medicine.   This information is not intended to replace advice given to you by your health care provider. Make sure you discuss any questions you have with your health care provider.   Document Revised: 05/23/2021 Document Reviewed: 02/12/2019   Elsevier Patient Education  2023 Elsevier Inc[employer]         Interventional Radiology Drain Record   Empty your drain at least once per day. You may empty it as often as needed. Use this form to write down the amount of fluid that has collected in the drainage container. Bring  this form with you to your follow-up visits. Please call [facility] at [phone] with any questions or concerns prior to your appointment.   Drain #1 location: ___________________   Date __________ Time __________ Amount __________   Date __________ Time __________ Amount __________   Date __________ Time __________ Amount __________   Date __________ Time __________ Amount __________   Date __________ Time __________ Amount __________   Date __________ Time __________ Amount __________   Date __________ Time __________ Amount __________   Date __________ Time __________ Amount __________   Date __________ Time __________ Amount __________   Date __________ Time __________ Amount __________   Date __________ Time __________ Amount __________   Date __________ Time __________ Amount __________   Date __________ Time __________ Amount __________   Date __________ Time __________ Amount __________

## 2023-04-14 NOTE — TOC Transition Note (Signed)
Transition of Care [facility]) - Discharge Note   Patient Details  Name: Helen Richardson MRN: [id number] Date of Birth: [birth year]  Transition of Care [facility]) CM/SW Contact:  Amada Jupiter, LCSW Phone Number: 04/14/2023, 2:03 PM   Clinical Narrative:    Pt medically cleared for dc to [facility] SN[facility] today Berkley Harvey remains "good" through today per facility).  Pt and family aware and agreeable.  PTAR called at 2pm.  RN to call report to [phone] ext. [phone].  No further TOC needs.   Final next level of care: Skilled Nursing Facility Barriers to Discharge: No Barriers Identified   Patient Goals and CMS Choice Patient states their goals for this hospitalization and ongoing recovery are:: Rehab CMS Medicare.gov Compare Post Acute Care list provided to:: Patient Represenative (must comment) Choice offered to / list presented to : Adult Children [place] ownership interest in [facility].provided to:: Adult Children    Discharge Placement   Existing PASRR number confirmed : 04/07/23          Patient chooses bed at: [facility] Patient to be transferred to facility by: PTAR Name of family member notified: granddaughter, Morrie Sheldon Patient and family notified of of transfer: 04/14/23  Discharge Plan and Services Additional resources added to the After Visit Summary for   In-house Referral: Clinical Social Work   Post Acute Care Choice: Skilled Nursing Facility          DME Arranged: N/A DME Agency: NA                  Social Drivers of Health (SDOH) Interventions SDOH Screenings   Food Insecurity: Patient Unable To Answer (03/30/2023)  Housing: Unknown (03/30/2023)  Transportation Needs: No Transportation Needs (03/30/2023)  Utilities: Not At Risk (03/30/2023)  Alcohol Screen: Low Risk  (10/18/2021)  Depression (PHQ2-9): Low Risk  (10/18/2021)  Financial Resource Strain: Low Risk  (10/18/2021)  Physical Activity: Inactive (10/18/2021)  Social Connections: Patient  Unable To Answer (03/30/2023)  Stress: No Stress Concern Present (10/18/2021)  Tobacco Use: Low Risk  (03/29/2023)     Readmission Risk Interventions    04/06/2023    2:55 PM  Readmission Risk Prevention Plan  Transportation Screening Complete  PCP or Specialist Appt within 5-7 Days Complete  Home Care Screening Complete  Medication Review (RN CM) Complete

## 2023-04-27 ENCOUNTER — Ambulatory Visit (HOSPITAL_COMMUNITY)
Admission: RE | Admit: 2023-04-27 | Discharge: 2023-04-27 | Disposition: A | Discharge status: Home / Self Care | Payer: Medicare PPO | Source: Ambulatory Visit | Attending: Radiology | Admitting: Radiology

## 2023-04-27 ENCOUNTER — Ambulatory Visit (HOSPITAL_COMMUNITY)
Admission: RE | Admit: 2023-04-27 | Discharge: 2023-04-27 | Disposition: A | Discharge status: Home / Self Care | Payer: Medicare Other | Source: Ambulatory Visit | Attending: Radiology | Admitting: Radiology

## 2023-04-27 ENCOUNTER — Other Ambulatory Visit: Payer: Self-pay | Admitting: Radiology

## 2023-04-27 ENCOUNTER — Other Ambulatory Visit (HOSPITAL_COMMUNITY): Payer: Self-pay | Admitting: Radiology

## 2023-04-27 ENCOUNTER — Ambulatory Visit (HOSPITAL_COMMUNITY)
Admission: RE | Admit: 2023-04-27 | Discharge: 2023-04-27 | Disposition: A | Discharge status: Home / Self Care | Payer: Medicare PPO | Source: Ambulatory Visit | Attending: Radiology

## 2023-04-27 DIAGNOSIS — R188 Other ascites: Secondary | ICD-10-CM

## 2023-04-27 DIAGNOSIS — K651 Peritoneal abscess: Secondary | ICD-10-CM | POA: Diagnosis present

## 2023-04-27 DIAGNOSIS — N200 Calculus of kidney: Secondary | ICD-10-CM | POA: Insufficient documentation

## 2023-04-27 DIAGNOSIS — I700 Atherosclerosis of aorta: Secondary | ICD-10-CM | POA: Insufficient documentation

## 2023-04-27 DIAGNOSIS — J90 Pleural effusion, not elsewhere classified: Secondary | ICD-10-CM | POA: Diagnosis not present

## 2023-04-27 HISTORY — PX: IR RADIOLOGIST EVAL & MGMT: IMG5224

## 2023-04-27 MED ORDER — IOHEXOL 300 MG/ML  SOLN
50.0000 mL | Freq: Once | INTRAMUSCULAR | Status: AC | PRN
  Administered 2023-04-27: 5 mL

## 2023-04-27 MED ORDER — IOHEXOL 300 MG/ML  SOLN
100.0000 mL | Freq: Once | INTRAMUSCULAR | Status: DC | PRN
Start: 2023-04-27 — End: 2023-04-27

## 2023-04-27 NOTE — Progress Notes (Signed)
 Referring Physician(s): Lanae Boast   Supervising Physician: Roanna Banning  Chief Complaint: The patient is seen in follow up today for colon mass, retroperitoneal abscess; s/p drain placement 03/31/23 with exchange 04/06/23  History of present illness:  Helen Richardson is an 89 year old female with a history of CHF, diabetes, hypercholesteremia, and hypertension.  On 03/28/23 pt presented to the ED with hyperglycemia and right flank pain.  CT A/P 03/28/23 revealed a heterogenous mass of the distal ascending colon and a complex collection in the right perirenal space lateral to the kidney.  Pt is s/p CT guided drain placement 03/31/23 with Dr. Loreta Ave.  Upon discharge she has been residing at [facility] and presents today 04/27/23 for follow up.  She is accompanied by her granddaughter today who provided some history - pt was living at home previously with a nurse visiting daily before most recent incident.  There are no drain logs available from [facility] so we are unaware of output since hospital discharge on 04/14/23.  She underwent a CT A/P without contrast due to inability to access a vein - CT revealed resolution of abscess.  She also underwent drain injection in IR which revealed no fistulous tracts.  Results were discussed with pt and granddaughter at bedside by Dr. Milford Cage.  The drain was subsequently removed after today's follow up results.  Past Medical History:  Diagnosis Date   CHF (congestive heart failure) (HCC)    Diabetes mellitus without complication (HCC)    High cholesterol    HTN (hypertension)    Obesity     Past Surgical History:  Procedure Laterality Date   CATARACT EXTRACTION, BILATERAL     ingrown toenail Left    left great toenail   IR CATHETER TUBE CHANGE  04/06/2023   PARTIAL HYSTERECTOMY      Allergies: Tradjenta [linagliptin]  Medications: Prior to Admission medications   Medication Sig Start Date End Date Taking? Authorizing Provider   amoxicillin-clavulanate (AUGMENTIN) 875-125 MG tablet Take 1 tablet by mouth 2 (two) times daily. Stop date 04/28/23 04/08/23   Lanae Boast, MD  ASPIRIN EC ADULT LOW DOSE 81 MG tablet Take 81 mg by mouth daily. 02/09/23   [provider]  CALCIUM + VITAMIN D3 600-5 MG-MCG TABS Take 1 tablet by mouth daily.    [provider]  FEROSUL 325 (65 Fe) MG tablet Take 325 mg by mouth 2 (two) times daily.    [provider]  furosemide (LASIX) 40 MG tablet Take 40 mg by mouth daily.    [provider]  pantoprazole (PROTONIX) 40 MG tablet Take 1 tablet (40 mg total) by mouth 2 (two) times daily. Patient not taking: Reported on 03/29/2023 01/20/23   Burnadette Pop, MD  polyethylene glycol (MIRALAX / GLYCOLAX) 17 g packet Take 17 g by mouth daily. Patient not taking: Reported on 03/29/2023 01/21/23   Burnadette Pop, MD  potassium chloride SA (KLOR-CON M) 20 MEQ tablet Take 1 tablet (20 mEq total) by mouth daily. 04/13/23   Lanae Boast, MD     Family History  Problem Relation Age of Onset   Diabetes Mother    Hypertension Mother    Hypertension Father    Stroke Father     Social History   Socioeconomic History   Marital status: Widowed    Spouse name: Not on file   Number of children: Not on file   Years of education: Not on file   Highest education level: Not on file  Occupational History   Occupation: retired  Tobacco Use   Smoking status: Never   Smokeless tobacco: Never  Vaping Use   Vaping status: Never Used  Substance and Sexual Activity   Alcohol use: No   Drug use: No   Sexual activity: Not Currently  Other Topics Concern   Not on file  Social History Narrative   Not on file   Social Drivers of Health   Financial Resource Strain: Low Risk  (10/18/2021)   Overall Financial Resource Strain (CARDIA)    Difficulty of Paying Living Expenses: Not hard at all  Food Insecurity: Patient Unable To Answer (03/30/2023)   Hunger Vital Sign    Worried  About Running Out of Food in the Last Year: Patient unable to answer    Ran Out of Food in the Last Year: Patient unable to answer  Transportation Needs: No Transportation Needs (03/30/2023)   PRAPARE - Administrator, Civil Service (Medical): No    Lack of Transportation (Non-Medical): No  Physical Activity: Inactive (10/18/2021)   Exercise Vital Sign    Days of Exercise per Week: 0 days    Minutes of Exercise per Session: 0 min  Stress: No Stress Concern Present (10/18/2021)   [employer] of Occupational Health - Occupational Stress Questionnaire    Feeling of Stress : Not at all  Social Connections: Patient Unable To Answer (03/30/2023)   Social Connection and Isolation Panel [NHANES]    Frequency of Communication with Friends and Family: Patient unable to answer    Frequency of Social Gatherings with Friends and Family: Patient unable to answer    Attends Religious Services: Patient unable to answer    Active Member of Clubs or Organizations: Patient unable to answer    Attends Banker Meetings: Patient unable to answer    Marital Status: Patient unable to answer     Vital Signs: There were no vitals taken for this visit.  Physical Exam Constitutional:      Appearance: She is obese.  Cardiovascular:     Rate and Rhythm: Normal rate and regular rhythm.  Pulmonary:     Effort: Pulmonary effort is normal.  Skin:    General: Skin is warm and dry.  Neurological:     Mental Status: She is alert. She is disoriented.     Imaging: No results found.  Labs:  CBC: Recent Labs    04/10/23 1107 04/11/23 0614 04/11/23 1859 04/12/23 0323 04/13/23 0312  WBC 10.2 8.6  --  9.3 10.4  HGB 7.7* 7.0* 7.6* 6.4* 8.3*  HCT 26.1* 22.8* 24.7* 20.7* 25.6*  PLT 534* 552*  --  539* 523*    COAGS: Recent Labs    03/29/23 0850  INR 1.2    BMP: Recent Labs    04/11/23 0614 04/12/23 0323 04/13/23 0312 04/13/23 1459  NA 135 142 138 137  K 3.0* 3.2*  2.9* 3.3*  CL 103 111 107 106  CO2 25 24 24 24   GLUCOSE 167* 108* 134* 126*  BUN <5* <5* <5* <5*  CALCIUM 7.2* 7.4* 7.3* 7.3*  CREATININE 0.76 0.80 0.89 0.82  GFRNONAA >60 >60 >60 >60    LIVER FUNCTION TESTS: Recent Labs    11/19/22 1255 03/28/23 1641 03/29/23 0401  BILITOT 0.4 1.0 0.7  AST 11* 14* 15  ALT 6 12 11   ALKPHOS 51 71 63  PROT 6.6 7.2 6.6  ALBUMIN 3.3* 2.8* 2.4*    Assessment and Plan:  Pt is  s/p CT guided drain placement 03/31/23 with Dr. Loreta Ave.  Upon discharge she has been residing at [employer] and presents today 04/27/23 for follow up.  She is accompanied by her granddaughter today who provided some history - pt was living at home previously with a nurse visiting daily before most recent incident.  There are no drain logs available from [facility] so we are unaware of output since hospital discharge on 04/14/23.  She underwent a CT A/P without contrast due to inability to access a vein - CT revealed resolution of abscess.  She also underwent drain injection in IR which revealed no fistulous tracts.  Results were discussed with pt and granddaughter at bedside by Dr. Milford Cage.  The drain was subsequently removed after today's follow up results.  Electronically Signed: Loman Brooklyn 04/27/2023, 3:57 PM   I spent a total of 40 Minutes in face to face in clinical consultation, greater than 50% of which was counseling/coordinating care for image guided drain placement.

## 2023-04-28 ENCOUNTER — Other Ambulatory Visit (HOSPITAL_COMMUNITY): Payer: Self-pay | Admitting: Radiology

## 2023-04-28 DIAGNOSIS — R188 Other ascites: Secondary | ICD-10-CM

## 2023-07-02 DEATH — deceased
